# Patient Record
Sex: Male | Born: 1940 | Race: Black or African American | Hispanic: No | Smoking: Former smoker
Health system: Southern US, Community
[De-identification: ages and names within clinical notes are randomized; demographics above are authoritative.]

## PROBLEM LIST (undated history)

## (undated) DIAGNOSIS — R079 Chest pain, unspecified: Secondary | ICD-10-CM

## (undated) DIAGNOSIS — E7800 Pure hypercholesterolemia, unspecified: Secondary | ICD-10-CM

## (undated) DIAGNOSIS — I10 Essential (primary) hypertension: Secondary | ICD-10-CM

## (undated) DIAGNOSIS — N200 Calculus of kidney: Secondary | ICD-10-CM

## (undated) HISTORY — PX: PROSTATE BIOPSY: SHX241

## (undated) HISTORY — DX: Chest pain, unspecified: R07.9

## (undated) HISTORY — PX: ROTATOR CUFF REPAIR: SHX139

## (undated) HISTORY — PX: CATARACT EXTRACTION: SUR2

## (undated) HISTORY — PX: EYE SURGERY: SHX253

---

## 2011-09-15 ENCOUNTER — Emergency Department (HOSPITAL_COMMUNITY): Payer: Medicare Other

## 2011-09-15 ENCOUNTER — Encounter: Payer: Self-pay | Admitting: Emergency Medicine

## 2011-09-15 ENCOUNTER — Emergency Department (HOSPITAL_COMMUNITY)
Admission: EM | Admit: 2011-09-15 | Discharge: 2011-09-15 | Disposition: A | Discharge status: Home / Self Care | Payer: Medicare Other | Attending: Emergency Medicine | Admitting: Emergency Medicine

## 2011-09-15 DIAGNOSIS — E7800 Pure hypercholesterolemia, unspecified: Secondary | ICD-10-CM | POA: Insufficient documentation

## 2011-09-15 DIAGNOSIS — R0602 Shortness of breath: Secondary | ICD-10-CM | POA: Insufficient documentation

## 2011-09-15 DIAGNOSIS — I10 Essential (primary) hypertension: Secondary | ICD-10-CM | POA: Insufficient documentation

## 2011-09-15 DIAGNOSIS — R059 Cough, unspecified: Secondary | ICD-10-CM | POA: Insufficient documentation

## 2011-09-15 DIAGNOSIS — Z79899 Other long term (current) drug therapy: Secondary | ICD-10-CM | POA: Insufficient documentation

## 2011-09-15 DIAGNOSIS — J40 Bronchitis, not specified as acute or chronic: Secondary | ICD-10-CM | POA: Insufficient documentation

## 2011-09-15 DIAGNOSIS — E119 Type 2 diabetes mellitus without complications: Secondary | ICD-10-CM | POA: Insufficient documentation

## 2011-09-15 DIAGNOSIS — R05 Cough: Secondary | ICD-10-CM | POA: Insufficient documentation

## 2011-09-15 HISTORY — DX: Pure hypercholesterolemia, unspecified: E78.00

## 2011-09-15 HISTORY — DX: Essential (primary) hypertension: I10

## 2011-09-15 LAB — POCT I-STAT, CHEM 8
Calcium, Ion: 1.04 mmol/L — ABNORMAL LOW (ref 1.12–1.32)
Glucose, Bld: 122 mg/dL — ABNORMAL HIGH (ref 70–99)
Potassium: 3.7 mEq/L (ref 3.5–5.1)
TCO2: 27 mmol/L (ref 0–100)

## 2011-09-15 LAB — CBC
HCT: 37.4 % — ABNORMAL LOW (ref 39.0–52.0)
MCH: 26.3 pg (ref 26.0–34.0)
MCHC: 32.1 g/dL (ref 30.0–36.0)
RDW: 14.8 % (ref 11.5–15.5)

## 2011-09-15 LAB — POCT I-STAT TROPONIN I: Troponin i, poc: 0.01 ng/mL (ref 0.00–0.08)

## 2011-09-15 MED ORDER — ASPIRIN 81 MG PO CHEW
324.0000 mg | CHEWABLE_TABLET | Freq: Once | ORAL | Status: DC
  Filled 2011-09-15: qty 4

## 2011-09-15 MED ORDER — PREDNISONE 50 MG PO TABS
50.0000 mg | ORAL_TABLET | Freq: Every day | ORAL | Status: DC
  Administered 2011-09-15: 50 mg via ORAL
  Filled 2011-09-15: qty 1

## 2011-09-15 MED ORDER — ALBUTEROL SULFATE HFA 108 (90 BASE) MCG/ACT IN AERS
2.0000 | INHALATION_SPRAY | Freq: Four times a day (QID) | RESPIRATORY_TRACT | Status: DC
  Administered 2011-09-15: 2 via RESPIRATORY_TRACT
  Filled 2011-09-15: qty 6.7

## 2011-09-15 MED ORDER — AZITHROMYCIN 250 MG PO TABS: 250.0000 mg | ORAL_TABLET | Freq: Every day | ORAL | Status: AC

## 2011-09-15 NOTE — ED Provider Notes (Addendum)
History     CSN: [id number] Arrival date & time: 09/15/2011  8:43 AM   First MD Initiated Contact with Patient 09/15/11 [phone]      Chief Complaint  Patient presents with  . Cough  . Shortness of Breath  CC:  Shortness of breath   Pleasant gentleman with a known history of hypertension. Reports cough and cold symptoms for the past 2 weeks. He did see his doctor yesterday and was given prescriptions for amoxicillin, hydrocodone, and patanase. He has been coughing up some white phlegm. He actually denies any chest pain. His main concern was becoming short of breath. When he was coughing 2 days ago and again this morning. He has had no vomiting, he denies any fever at home. He has had no leg swelling or edema. No history of clots. Denies any recent sick contacts. Furthermore denies any hemoptysis.     (Consider location/radiation/quality/duration/timing/severity/associated sxs/prior treatment) HPI  Past Medical History  Diagnosis Date  . Hypertension   . Hypercholesterolemia     Past Surgical History  Procedure Date  . Eye surgery     History reviewed. No pertinent family history.  History  Substance Use Topics  . Smoking status: Never Smoker   . Smokeless tobacco: Not on file  . Alcohol Use: No      Review of Systems  All other systems reviewed and are negative.    Allergies  Review of patient's allergies indicates no known allergies.  Home Medications   Current Outpatient Rx  Name Route Sig Dispense Refill  . AMLODIPINE BESYLATE 5 MG PO TABS Oral Take 5 mg by mouth daily.      . AMOXICILLIN 875 MG PO TABS Oral Take 875 mg by mouth 2 (two) times daily.      . ATORVASTATIN CALCIUM 20 MG PO TABS Oral Take 20 mg by mouth daily.      [name] GLIMEPIRIDE 2 MG PO TABS Oral Take 2 mg by mouth 2 (two) times daily.      [name] HYDROCODONE-HOMATROPINE 5-1.5 MG/5ML PO SYRP Oral Take 5-10 mLs by mouth every 4 (four) hours as needed. For 5 days     . NAPROXEN SODIUM 220 MG PO TABS  Oral Take 220-440 mg by mouth as needed. For back pain     . OLOPATADINE HCL 0.6 % NA SOLN Nasal Place 1 puff into the nose 2 (two) times daily.      Ty Hilts PO Oral Take 1 tablet by mouth daily. 20 mg (patient unsure of hctz dose)       BP 123/63  Pulse 80  Temp(Src) 99.7 F (37.6 C) (Oral)  Resp 24  Ht 5\' 6"  (1.676 m)  Wt 180 lb (81.647 kg)  BMI 29.05 kg/m2  SpO2 98%  Physical Exam  Constitutional: He is oriented to person, place, and time. He appears well-developed and well-nourished.  HENT:  Head: Normocephalic and atraumatic.  Eyes: Conjunctivae and EOM are normal. Pupils are equal, round, and reactive to light.  Neck: Neck supple.  Cardiovascular: Normal rate and regular rhythm.  Exam reveals no gallop and no friction rub.   No murmur heard. Pulmonary/Chest: Effort normal and breath sounds normal. No respiratory distress. He has no wheezes. He has no rales. He exhibits no tenderness.  Abdominal: Soft. Bowel sounds are normal. He exhibits no distension. There is no tenderness. There is no rebound and no guarding.  Musculoskeletal: Normal range of motion.  Neurological: He is alert and oriented to person, place, and  time. No cranial nerve deficit. Coordination normal.  Skin: Skin is warm and dry. No rash noted.  Psychiatric: He has a normal mood and affect.    ED Course  Procedures (including critical care time)  Labs Reviewed  CBC - Abnormal; Notable for the following:    Hemoglobin 12.0 (*)    HCT 37.4 (*)    All other components within normal limits  POCT I-STAT, CHEM 8 - Abnormal; Notable for the following:    Glucose, Bld 122 (*)    Calcium, Ion 1.04 (*)    Hemoglobin 12.9 (*)    HCT 38.0 (*)    All other components within normal limits  POCT I-STAT TROPONIN I  I-STAT TROPONIN I  I-STAT, CHEM 8   Dg Chest 2 View  09/15/2011  *RADIOLOGY REPORT*  Clinical Data: Cough, congestion, shortness of breath, hypertension, diabetes  CHEST - 2 VIEW  Comparison:  None.  Findings: Minimal enlargement of cardiac silhouette. Mild elongation of thoracic aorta. Pulmonary vascularity normal. Minimal bronchitic changes without infiltrate or effusion. No pneumothorax. Bones appear slightly demineralized with endplate spur formation thoracic spine. Tendon anchors at right humeral head question prior rotator cuff repair.  IMPRESSION: Minimal bronchitic changes and cardiac silhouette enlargement.  Original Report Authenticated By: Lollie Marrow, M.D.     No diagnosis found.    MDM  Pt not yet into room.   Pt is seen and examined;  Initial history and physical completed.  Will follow.     Date: 09/15/2011  Rate: 76  Rhythm: normal sinus rhythm  QRS Axis: normal  Intervals: normal  ST/T Wave abnormalities: nonspecific T wave changes  Conduction Disutrbances:none  Narrative Interpretation:   Old EKG Reviewed: none available            Massie Cogliano A. Patrica Duel, MD 09/15/11 0912   Results for orders placed during the hospital encounter of 09/15/11  CBC      Component Value Range   WBC 5.7  4.0 - 10.5 (K/uL)   RBC 4.57  4.22 - 5.81 (MIL/uL)   Hemoglobin 12.0 (*) 13.0 - 17.0 (g/dL)   HCT 98.1 (*) 19.1 - 52.0 (%)   MCV 81.8  78.0 - 100.0 (fL)   MCH 26.3  26.0 - 34.0 (pg)   MCHC 32.1  30.0 - 36.0 (g/dL)   RDW 47.8  29.5 - 62.1 (%)   Platelets 203  150 - 400 (K/uL)  POCT I-STAT, CHEM 8      Component Value Range   Sodium 142  135 - 145 (mEq/L)   Potassium 3.7  3.5 - 5.1 (mEq/L)   Chloride 104  96 - 112 (mEq/L)   BUN 11  6 - 23 (mg/dL)   Creatinine, Ser 3.08  0.50 - 1.35 (mg/dL)   Glucose, Bld 657 (*) 70 - 99 (mg/dL)   Calcium, Ion 8.46 (*) 1.12 - 1.32 (mmol/L)   TCO2 27  0 - 100 (mmol/L)   Hemoglobin 12.9 (*) 13.0 - 17.0 (g/dL)   HCT 96.2 (*) 95.2 - 52.0 (%)  POCT I-STAT TROPONIN I      Component Value Range   Troponin i, poc 0.01  0.00 - 0.08 (ng/mL)   Comment 3            Dg Chest 2 View  09/15/2011  *RADIOLOGY REPORT*  Clinical  Data: Cough, congestion, shortness of breath, hypertension, diabetes  CHEST - 2 VIEW  Comparison: None.  Findings: Minimal enlargement of cardiac silhouette. Mild elongation of thoracic  aorta. Pulmonary vascularity normal. Minimal bronchitic changes without infiltrate or effusion. No pneumothorax. Bones appear slightly demineralized with endplate spur formation thoracic spine. Tendon anchors at right humeral head question prior rotator cuff repair.  IMPRESSION: Minimal bronchitic changes and cardiac silhouette enlargement.  Original Report Authenticated By: Lollie Marrow, M.D.     She was reassessed in the room, remains in no acute distress, persistent, dry cough noted. Chest x-ray and lab tests reviewed with the patient and his spouse. I speculated that this is likely caused from a viral etiology however will treat the patient with prednisone, albuterol, and changes, amoxicillin to Zithromax. He is discharged home in stable improved condition, he'll be instructed to see his doctor later this week to be rechecked, return to the ED for any concerns or changing symptoms    Kiley Torrence A. Patrica Duel, MD 09/15/11 1026

## 2011-09-15 NOTE — ED Notes (Signed)
Pt states that he has had a cough/cold for the past 2 weeks.  2 days ago he when he coughed he could not catch his breath after words.  Went to Dr. Burgess Estelle and was prescribed hydrocodone, amoxicillin, patanase.  Coughing up white phlegm.

## 2011-12-05 ENCOUNTER — Emergency Department (HOSPITAL_COMMUNITY): Payer: Medicare Other

## 2011-12-05 ENCOUNTER — Emergency Department (HOSPITAL_COMMUNITY)
Admission: EM | Admit: 2011-12-05 | Discharge: 2011-12-05 | Disposition: A | Discharge status: Home / Self Care | Payer: Medicare Other | Attending: Emergency Medicine | Admitting: Emergency Medicine

## 2011-12-05 ENCOUNTER — Encounter (HOSPITAL_COMMUNITY): Payer: Self-pay | Admitting: *Deleted

## 2011-12-05 DIAGNOSIS — Z79899 Other long term (current) drug therapy: Secondary | ICD-10-CM | POA: Insufficient documentation

## 2011-12-05 DIAGNOSIS — E119 Type 2 diabetes mellitus without complications: Secondary | ICD-10-CM | POA: Insufficient documentation

## 2011-12-05 DIAGNOSIS — E7800 Pure hypercholesterolemia, unspecified: Secondary | ICD-10-CM | POA: Insufficient documentation

## 2011-12-05 DIAGNOSIS — N509 Disorder of male genital organs, unspecified: Secondary | ICD-10-CM | POA: Insufficient documentation

## 2011-12-05 DIAGNOSIS — N400 Enlarged prostate without lower urinary tract symptoms: Secondary | ICD-10-CM | POA: Insufficient documentation

## 2011-12-05 DIAGNOSIS — N201 Calculus of ureter: Secondary | ICD-10-CM | POA: Insufficient documentation

## 2011-12-05 DIAGNOSIS — R109 Unspecified abdominal pain: Secondary | ICD-10-CM | POA: Insufficient documentation

## 2011-12-05 DIAGNOSIS — N23 Unspecified renal colic: Secondary | ICD-10-CM

## 2011-12-05 DIAGNOSIS — I10 Essential (primary) hypertension: Secondary | ICD-10-CM | POA: Insufficient documentation

## 2011-12-05 LAB — COMPREHENSIVE METABOLIC PANEL
Albumin: 3.6 g/dL (ref 3.5–5.2)
Alkaline Phosphatase: 87 U/L (ref 39–117)
BUN: 19 mg/dL (ref 6–23)
CO2: 28 mEq/L (ref 19–32)
Chloride: 105 mEq/L (ref 96–112)
Glucose, Bld: 177 mg/dL — ABNORMAL HIGH (ref 70–99)
Potassium: 4.3 mEq/L (ref 3.5–5.1)
Total Bilirubin: 0.2 mg/dL — ABNORMAL LOW (ref 0.3–1.2)

## 2011-12-05 LAB — DIFFERENTIAL
Lymphocytes Relative: 9 % — ABNORMAL LOW (ref 12–46)
Lymphs Abs: 1.2 10*3/uL (ref 0.7–4.0)
Monocytes Relative: 4 % (ref 3–12)
Neutro Abs: 10.9 10*3/uL — ABNORMAL HIGH (ref 1.7–7.7)
Neutrophils Relative %: 86 % — ABNORMAL HIGH (ref 43–77)

## 2011-12-05 LAB — CBC
Hemoglobin: 12 g/dL — ABNORMAL LOW (ref 13.0–17.0)
RBC: 4.57 MIL/uL (ref 4.22–5.81)

## 2011-12-05 LAB — URINALYSIS, ROUTINE W REFLEX MICROSCOPIC
Glucose, UA: NEGATIVE mg/dL
Ketones, ur: NEGATIVE mg/dL
Leukocytes, UA: NEGATIVE
Protein, ur: NEGATIVE mg/dL

## 2011-12-05 LAB — URINE MICROSCOPIC-ADD ON

## 2011-12-05 MED ORDER — ONDANSETRON HCL 4 MG/2ML IJ SOLN
4.0000 mg | Freq: Once | INTRAMUSCULAR | Status: AC
  Administered 2011-12-05: 4 mg via INTRAVENOUS
  Filled 2011-12-05: qty 2

## 2011-12-05 MED ORDER — ONDANSETRON HCL 4 MG PO TABS: 4.0000 mg | ORAL_TABLET | Freq: Four times a day (QID) | ORAL | Status: AC

## 2011-12-05 MED ORDER — MORPHINE SULFATE 2 MG/ML IJ SOLN
2.0000 mg | Freq: Once | INTRAMUSCULAR | Status: AC
  Administered 2011-12-05: 2 mg via INTRAVENOUS
  Filled 2011-12-05: qty 1

## 2011-12-05 MED ORDER — TAMSULOSIN HCL 0.4 MG PO CAPS: 0.4000 mg | ORAL_CAPSULE | Freq: Every day | ORAL | Status: DC

## 2011-12-05 MED ORDER — OXYCODONE-ACETAMINOPHEN 5-325 MG PO TABS: 1.0000 | ORAL_TABLET | ORAL | Status: AC | PRN

## 2011-12-05 MED ORDER — SODIUM CHLORIDE 0.9 % IV BOLUS (SEPSIS)
500.0000 mL | Freq: Once | INTRAVENOUS | Status: AC
  Administered 2011-12-05: 500 mL via INTRAVENOUS

## 2011-12-05 MED ORDER — KETOROLAC TROMETHAMINE 30 MG/ML IJ SOLN
30.0000 mg | Freq: Once | INTRAMUSCULAR | Status: AC
  Administered 2011-12-05: 30 mg via INTRAVENOUS
  Filled 2011-12-05: qty 1

## 2011-12-05 NOTE — ED Notes (Signed)
Patient is resting comfortably. 

## 2011-12-05 NOTE — ED Notes (Signed)
MD at bedside. 

## 2011-12-05 NOTE — ED Provider Notes (Signed)
History     CSN: [id number]  Arrival date & time 12/05/11  4098   First MD Initiated Contact with Patient 12/05/11 [phone]      Chief Complaint  Patient presents with  . Abdominal Pain    Left side    (Consider location/radiation/quality/duration/timing/severity/associated sxs/prior treatment) Patient is a 71 y.o. male presenting with abdominal pain.  Abdominal Pain The primary symptoms of the illness include abdominal pain. The primary symptoms of the illness do not include fever, shortness of breath, nausea, vomiting, diarrhea or dysuria. The onset of the illness was sudden. The problem has not changed since onset. Symptoms associated with the illness do not include chills, hematuria, frequency or back pain.  Pt with acute onset LLQ pain radiating to L groin starting at 0500 this AM. No N/V/D, fever, SOB, hematuria, hesitancy, dysuria, frequency, penile d/c or pain. No prev similar episodes. No personal or family hx of renal stones  Past Medical History  Diagnosis Date  . Hypertension   . Hypercholesterolemia   . Diabetes mellitus     Past Surgical History  Procedure Date  . Eye surgery   . Rotator cuff repair     Right    History reviewed. No pertinent family history.  History  Substance Use Topics  . Smoking status: Never Smoker   . Smokeless tobacco: Never Used  . Alcohol Use: No      Review of Systems  Constitutional: Negative for fever and chills.  Respiratory: Negative for shortness of breath.   Cardiovascular: Negative for chest pain.  Gastrointestinal: Positive for abdominal pain. Negative for nausea, vomiting and diarrhea.  Genitourinary: Positive for flank pain and testicular pain. Negative for dysuria, frequency, hematuria, discharge and penile pain.  Musculoskeletal: Negative for back pain.  Skin: Negative for color change, pallor and rash.  Neurological: Negative for dizziness, weakness and numbness.    Allergies  Review of patient's allergies  indicates no known allergies.  Home Medications   Current Outpatient Rx  Name Route Sig Dispense Refill  . AMLODIPINE BESYLATE 5 MG PO TABS Oral Take 5 mg by mouth daily.      . ASPIRIN EC 81 MG PO TBEC Oral Take 81 mg by mouth daily.    . ATORVASTATIN CALCIUM 20 MG PO TABS Oral Take 20 mg by mouth daily.      [name] VITAMIN D 1000 UNITS PO TABS Oral Take 1,000 Units by mouth daily.    [name] GLIMEPIRIDE 2 MG PO TABS Oral Take 2 mg by mouth 2 (two) times daily.      [name] NAPROXEN SODIUM 220 MG PO TABS Oral Take 220-440 mg by mouth as needed. For back pain     . ONDANSETRON HCL 4 MG PO TABS Oral Take 1 tablet (4 mg total) by mouth every 6 (six) hours. 12 tablet 0  . OXYCODONE-ACETAMINOPHEN 5-325 MG PO TABS Oral Take 1 tablet by mouth every 4 (four) hours as needed for pain. 15 tablet 0  . TAMSULOSIN HCL 0.4 MG PO CAPS Oral Take 1 capsule (0.4 mg total) by mouth daily. 5 capsule 0    BP 128/63  Pulse 71  Temp(Src) 98.1 F (36.7 C) (Oral)  Resp 17  Wt 185 lb (83.915 kg)  SpO2 99%  Physical Exam  Nursing note and vitals reviewed. Constitutional: He is oriented to person, place, and time. He appears well-developed and well-nourished. No distress.  HENT:  Head: Normocephalic and atraumatic.  Mouth/Throat: Oropharynx is clear and moist.  Eyes: EOM  are normal. Pupils are equal, round, and reactive to light.  Neck: Normal range of motion. Neck supple.  Cardiovascular: Normal rate and regular rhythm.   Pulmonary/Chest: Effort normal and breath sounds normal. No respiratory distress. He has no wheezes. He has no rales.  Abdominal: Soft. Bowel sounds are normal. He exhibits no distension and no mass. There is no tenderness. There is no rebound and no guarding. Hernia confirmed negative in the right inguinal area and confirmed negative in the left inguinal area.  Genitourinary: Cremasteric reflex is present. Right testis shows no mass, no swelling and no tenderness. Left testis shows no mass, no swelling  and no tenderness.  Musculoskeletal: Normal range of motion. He exhibits no edema and no tenderness.       No flank tenderness or midline lumbar ttp  Neurological: He is alert and oriented to person, place, and time.  Skin: Skin is warm and dry. No rash noted. No erythema.  Psychiatric: He has a normal mood and affect. His behavior is normal.    ED Course  Procedures (including critical care time)  Labs Reviewed  CBC - Abnormal; Notable for the following:    WBC 12.6 (*)    Hemoglobin 12.0 (*)    HCT 37.3 (*)    All other components within normal limits  DIFFERENTIAL - Abnormal; Notable for the following:    Neutrophils Relative 86 (*)    Neutro Abs 10.9 (*)    Lymphocytes Relative 9 (*)    All other components within normal limits  COMPREHENSIVE METABOLIC PANEL - Abnormal; Notable for the following:    Glucose, Bld 177 (*)    Creatinine, Ser 1.38 (*)    Total Bilirubin 0.2 (*)    GFR calc non Af Amer 50 (*)    GFR calc Af Amer 58 (*)    All other components within normal limits  URINALYSIS, ROUTINE W REFLEX MICROSCOPIC - Abnormal; Notable for the following:    Hgb urine dipstick LARGE (*)    All other components within normal limits  URINE MICROSCOPIC-ADD ON - Abnormal; Notable for the following:    Bacteria, UA FEW (*)    All other components within normal limits   Ct Abdomen Pelvis Wo Contrast  12/05/2011  *RADIOLOGY REPORT*  Clinical Data: Left flank pain.  CT ABDOMEN AND PELVIS WITHOUT CONTRAST  Technique:  Multidetector CT imaging of the abdomen and pelvis was performed following the standard protocol without intravenous contrast.  Comparison: None.  Findings: Visualized lung bases clear.  Patchy coronary and aortoiliac arterial calcifications.  Unremarkable uninfused evaluation of the liver, gallbladder, spleen, adrenal glands, pancreas.  There are inflammatory/edematous changes around the left kidney and left renal collecting system.  There is mild left ureterectasis down  to the level of a 2 mm calculus, approximately 2 cm from the ureteral orifice.  Urinary bladder is incompletely distended. There is a 3.6 cm fluid attenuation lesion in the lower pole right kidney.  There is a 1 mm calculus in the upper pole right renal collecting system.  Stomach and small bowel are nondistended.  Normal appendix.  Colon is nondilated, unremarkable.  There is marked prostatic enlargement with coarse central calcifications.  No ascites.  No free air. Degenerative disc disease L5-S1.  IMPRESSION:  1.  Partially obstructing 2 mm distal left ureteral calculus. 2.  Right nephrolithiasis without hydronephrosis. 3.  3.6 cm possible right renal cyst, incompletely characterized. 4.  Coronary and aortoiliac calcifications. 5.  Marked prostatic enlargement.  Original  Report Authenticated By: Thora Lance III, M.D.     1. Renal colic on left side       MDM   Pt symptoms have resolved. F/U with urology for persistent pain or return to ED for fever, chills, worsening symptoms       Loren Racer, MD 12/05/11 1137

## 2011-12-05 NOTE — ED Notes (Signed)
Family at bedside. 

## 2011-12-05 NOTE — ED Notes (Signed)
Vital signs stable. 

## 2011-12-05 NOTE — ED Notes (Signed)
Pt from home c/o pain that started at 0500 this morning in left groin and then moved into left abdomen, side and back. Pt has brief episode of nausea but no vomiting. Pt denies injury.

## 2013-05-23 ENCOUNTER — Ambulatory Visit: Payer: Medicare Other | Admitting: *Deleted

## 2013-05-23 DIAGNOSIS — R000 Tachycardia, unspecified: Secondary | ICD-10-CM

## 2013-05-23 DIAGNOSIS — I10 Essential (primary) hypertension: Secondary | ICD-10-CM

## 2013-05-23 NOTE — Progress Notes (Signed)
Holter monitor was place on patient, explained monitor to patient, patient verbally understand.

## 2013-05-30 ENCOUNTER — Other Ambulatory Visit: Payer: Self-pay | Admitting: *Deleted

## 2013-05-30 DIAGNOSIS — R002 Palpitations: Secondary | ICD-10-CM

## 2014-02-07 ENCOUNTER — Emergency Department (HOSPITAL_COMMUNITY)
Admission: EM | Admit: 2014-02-07 | Discharge: 2014-02-08 | Disposition: A | Discharge status: Home / Self Care | Payer: Medicare Other | Attending: Emergency Medicine | Admitting: Emergency Medicine

## 2014-02-07 ENCOUNTER — Emergency Department (HOSPITAL_COMMUNITY): Payer: Medicare Other

## 2014-02-07 ENCOUNTER — Encounter (HOSPITAL_COMMUNITY): Payer: Self-pay | Admitting: Emergency Medicine

## 2014-02-07 DIAGNOSIS — N200 Calculus of kidney: Secondary | ICD-10-CM | POA: Insufficient documentation

## 2014-02-07 DIAGNOSIS — N289 Disorder of kidney and ureter, unspecified: Secondary | ICD-10-CM

## 2014-02-07 DIAGNOSIS — G8929 Other chronic pain: Secondary | ICD-10-CM | POA: Insufficient documentation

## 2014-02-07 DIAGNOSIS — E7800 Pure hypercholesterolemia, unspecified: Secondary | ICD-10-CM | POA: Insufficient documentation

## 2014-02-07 DIAGNOSIS — Z79899 Other long term (current) drug therapy: Secondary | ICD-10-CM | POA: Insufficient documentation

## 2014-02-07 DIAGNOSIS — E119 Type 2 diabetes mellitus without complications: Secondary | ICD-10-CM | POA: Insufficient documentation

## 2014-02-07 DIAGNOSIS — Z7982 Long term (current) use of aspirin: Secondary | ICD-10-CM | POA: Insufficient documentation

## 2014-02-07 DIAGNOSIS — I10 Essential (primary) hypertension: Secondary | ICD-10-CM | POA: Insufficient documentation

## 2014-02-07 LAB — I-STAT CHEM 8, ED
BUN: 20 mg/dL (ref 6–23)
CALCIUM ION: 1.16 mmol/L (ref 1.13–1.30)
CHLORIDE: 104 meq/L (ref 96–112)
Creatinine, Ser: 1.4 mg/dL — ABNORMAL HIGH (ref 0.50–1.35)
Glucose, Bld: 143 mg/dL — ABNORMAL HIGH (ref 70–99)
HEMATOCRIT: 47 % (ref 39.0–52.0)
Hemoglobin: 16 g/dL (ref 13.0–17.0)
POTASSIUM: 4.7 meq/L (ref 3.7–5.3)
Sodium: 143 mEq/L (ref 137–147)
TCO2: 30 mmol/L (ref 0–100)

## 2014-02-07 LAB — URINALYSIS, ROUTINE W REFLEX MICROSCOPIC
BILIRUBIN URINE: NEGATIVE
GLUCOSE, UA: NEGATIVE mg/dL
KETONES UR: NEGATIVE mg/dL
Nitrite: NEGATIVE
PH: 7.5 (ref 5.0–8.0)
Protein, ur: NEGATIVE mg/dL
SPECIFIC GRAVITY, URINE: 1.014 (ref 1.005–1.030)
Urobilinogen, UA: 0.2 mg/dL (ref 0.0–1.0)

## 2014-02-07 LAB — URINE MICROSCOPIC-ADD ON

## 2014-02-07 MED ORDER — OXYCODONE-ACETAMINOPHEN 5-325 MG PO TABS
1.0000 | ORAL_TABLET | Freq: Once | ORAL | Status: AC
  Administered 2014-02-07: 1 via ORAL
  Filled 2014-02-07: qty 1

## 2014-02-07 MED ORDER — ONDANSETRON 8 MG PO TBDP
8.0000 mg | ORAL_TABLET | Freq: Once | ORAL | Status: AC
Start: 2014-02-07 — End: 2014-02-07
  Administered 2014-02-07: 8 mg via ORAL
  Filled 2014-02-07: qty 1

## 2014-02-07 MED ORDER — MORPHINE SULFATE 4 MG/ML IJ SOLN
4.0000 mg | Freq: Once | INTRAMUSCULAR | Status: AC
  Administered 2014-02-07: 4 mg via INTRAMUSCULAR
  Filled 2014-02-07: qty 1

## 2014-02-07 NOTE — ED Notes (Signed)
Per patient- started having left flank pain Monday night that awoke him. Describes patient as a throbbing pain. Pt reports having kidney stone in February with similar pain.

## 2014-02-07 NOTE — ED Notes (Signed)
Patient transported to CT 

## 2014-02-08 MED ORDER — TAMSULOSIN HCL 0.4 MG PO CAPS: 0.4000 mg | ORAL_CAPSULE | Freq: Every day | ORAL | Status: DC

## 2014-02-08 MED ORDER — OXYCODONE-ACETAMINOPHEN 5-325 MG PO TABS: 1.0000 | ORAL_TABLET | ORAL | Status: DC | PRN

## 2014-02-08 MED ORDER — OXYCODONE-ACETAMINOPHEN 5-325 MG PO TABS
1.0000 | ORAL_TABLET | Freq: Once | ORAL | Status: AC
Start: 2014-02-08 — End: 2014-02-08
  Administered 2014-02-08: 1 via ORAL
  Filled 2014-02-08: qty 1

## 2014-02-08 MED ORDER — ONDANSETRON HCL 8 MG PO TABS: 8.0000 mg | ORAL_TABLET | Freq: Three times a day (TID) | ORAL | Status: DC | PRN

## 2014-02-08 NOTE — ED Provider Notes (Signed)
CSN: [id number]     Arrival date & time 02/07/14  2059 History   First MD Initiated Contact with Patient 02/07/14 2153     Chief Complaint  Patient presents with  . Flank Pain     (Consider location/radiation/quality/duration/timing/severity/associated sxs/prior Treatment) HPI History provided by pt.   Pt developed pain in L flank 4 days ago.  Pain has been intermittent and non-radiating and is mildly aggravated by laying on right side.  Associated w/ urinary urgency.  Denies fever, cough, SOB, abdominal pain, N/V/D, other GU sx, LE weakness/paresthesias.  Denies trauma.  Has chronic low back pain but this pain different.  H/o kidney stone 11/2012 but that pain was more severe.   Past Medical History  Diagnosis Date  . Hypertension   . Hypercholesterolemia   . Diabetes mellitus    Past Surgical History  Procedure Laterality Date  . Eye surgery    . Rotator cuff repair      Right   History reviewed. No pertinent family history. History  Substance Use Topics  . Smoking status: Never Smoker   . Smokeless tobacco: Never Used  . Alcohol Use: No    Review of Systems  All other systems reviewed and are negative.     Allergies  Review of patient's allergies indicates no known allergies.  Home Medications   Prior to Admission medications   Medication Sig Start Date End Date Taking? Authorizing Provider  amLODipine (NORVASC) 5 MG tablet Take 5 mg by mouth daily.     Yes Historical Provider, MD  aspirin EC 81 MG tablet Take 81 mg by mouth daily.   Yes Historical Provider, MD  atorvastatin (LIPITOR) 20 MG tablet Take 20 mg by mouth daily.     Yes Historical Provider, MD  cholecalciferol (VITAMIN D) 1000 UNITS tablet Take 1,000 Units by mouth daily.   Yes Historical Provider, MD  glimepiride (AMARYL) 2 MG tablet Take 2 mg by mouth 2 (two) times daily.     Yes Historical Provider, MD  naproxen sodium (ALEVE) 220 MG tablet Take 220-440 mg by mouth as needed. For back pain    Yes  Historical Provider, MD  ondansetron (ZOFRAN) 8 MG tablet Take 1 tablet (8 mg total) by mouth every 8 (eight) hours as needed for nausea or vomiting. 02/08/14   Arville Lime Kumar Falwell, PA-C  oxyCODONE-acetaminophen (PERCOCET/ROXICET) 5-325 MG per tablet Take 1 tablet by mouth every 4 (four) hours as needed for severe pain. 02/08/14   Arville Lime Jozie Wulf, PA-C  tamsulosin (FLOMAX) 0.4 MG CAPS capsule Take 1 capsule (0.4 mg total) by mouth at bedtime. 02/08/14   Arville Lime Enzo Treu, PA-C   BP 163/78  Pulse 70  Temp(Src) 98.8 F (37.1 C) (Oral)  SpO2 100% Physical Exam  Nursing note and vitals reviewed. Constitutional: He is oriented to person, place, and time. He appears well-developed and well-nourished. No distress.  Pt does not appear uncomfortable.   HENT:  Head: Normocephalic and atraumatic.  Eyes:  Normal appearance  Neck: Normal range of motion.  Cardiovascular: Normal rate and regular rhythm.   Pulmonary/Chest: Effort normal and breath sounds normal. No respiratory distress.  Abdominal: Soft. Bowel sounds are normal. He exhibits no distension and no mass. There is no tenderness. There is no rebound and no guarding.  Genitourinary:  No CVA tenderness  Musculoskeletal: Normal range of motion.  T/L spine non-tender.  Pain in L flank is not aggravated by movement of torso.  Neurological: He is alert and oriented to  person, place, and time.  Skin: Skin is warm and dry. No rash noted.  Psychiatric: He has a normal mood and affect. His behavior is normal.    ED Course  Procedures (including critical care time) Labs Review Labs Reviewed  URINALYSIS, ROUTINE W REFLEX MICROSCOPIC - Abnormal; Notable for the following:    Hgb urine dipstick MODERATE (*)    Leukocytes, UA TRACE (*)    All other components within normal limits  I-STAT CHEM 8, ED - Abnormal; Notable for the following:    Creatinine, Ser 1.40 (*)    Glucose, Bld 143 (*)    All other components within normal limits   URINE MICROSCOPIC-ADD ON    Imaging Review Ct Abdomen Pelvis Wo Contrast  02/07/2014   CLINICAL DATA:  Left flank pain  EXAM: CT ABDOMEN AND PELVIS WITHOUT CONTRAST  TECHNIQUE: Multidetector CT imaging of the abdomen and pelvis was performed following the standard protocol without IV contrast.  COMPARISON:  DG ABDOMEN 1V dated 06/15/2012; CT ABD/PELV WO CM dated 12/05/2011  FINDINGS: Mild left hydronephrosis. Moderate left perinephric stranding. 4 mm calculus in the distal left ureter, just above the ureterovesical junction.  Tiny calculi in the right renal collecting system. Simple cyst in the right kidney.  Liver, gallbladder, spleen, pancreas, adrenal glands are within normal limits.  Normal appendix.  No free-fluid  Prostate is markedly enlarged.  Degenerative disc disease in the lower lumbar spine. No vertebral compression deformity.  IMPRESSION: 4 mm distal left ureteral calculus is associated with the left perinephric stranding and hydronephrosis.  Minimal right nephrolithiasis.   Electronically Signed   By: Maryclare Bean M.D.   On: 02/07/2014 23:40     EKG Interpretation None      MDM   Final diagnoses:  Nephrolithiasis  Renal insufficiency    73yo M presents w/ non-traumatic L flank pain.  CT abd/pelvis shows 43mm ureteral stone.  U/A neg for infection.  Cr elevated but appears to be his baseline.  Pain controlled w/ IM morphine; pt denies nausea.  D/c'd home w/ referral to urology, who he has seen for nephrolithiasis in the past, as well as percocet, zofran and flomax.  Return precautions discussed. Marland Kitchennwo    Remer Macho, PA-C 02/08/14 [phone]

## 2014-02-08 NOTE — ED Notes (Signed)
Patient's wife upset r/t wait time on DC papers---PA and charge nurse made aware

## 2014-02-08 NOTE — Discharge Instructions (Signed)
Take percocet as needed for severe pain.  Do not drive within four hours of taking this medication.  Take zofran as needed for nausea.  Take Flomax as prescribed.  This medication may cause lightheadedness upon standing.  Drink plenty of fluids.  Follow up with [facility].  You should return to the ER if you develop fever, worsening pain or uncontrolled vomiting.

## 2014-02-11 NOTE — ED Provider Notes (Signed)
Medical screening examination/treatment/procedure(s) were performed by non-physician practitioner and as supervising physician I was immediately available for consultation/collaboration.   EKG Interpretation None        Ephraim Hamburger, MD 02/11/14 [phone]

## 2014-03-02 LAB — POCT GLUCOSE (DEVICE FOR HOME USE): GLUCOSE FASTING, POC: 117 mg/dL — AB (ref 70–99)

## 2014-05-02 ENCOUNTER — Encounter: Payer: Self-pay | Admitting: *Deleted

## 2014-05-02 ENCOUNTER — Encounter: Payer: Medicare Other | Attending: Family Medicine | Admitting: *Deleted

## 2014-05-02 VITALS — Ht 66.0 in | Wt 179.6 lb

## 2014-05-02 DIAGNOSIS — E119 Type 2 diabetes mellitus without complications: Secondary | ICD-10-CM | POA: Insufficient documentation

## 2014-05-02 DIAGNOSIS — Z713 Dietary counseling and surveillance: Secondary | ICD-10-CM | POA: Diagnosis not present

## 2014-05-02 NOTE — Patient Instructions (Addendum)
Plan:  Aim for 4 Carb Choices per meal (60 grams) +/- 1 either way  Aim for 0-2 Carbs per snack if hungry  Include protein in moderation with your meals and snacks Consider reading food labels for Total Carbohydrate of foods Continue with your activity level daily as tolerated, great job! Consider checking BG at alternate times per day  Continue taking medication: Amaryl twice a day before meals as directed by MD

## 2014-05-02 NOTE — Progress Notes (Signed)
Appt start time: 1430 end time:  1600.  Assessment:  Patient was seen on  05/02/14 for individual diabetes education. States he retired from [employer] job in 2000, has been going to [employer] 4-5 days a week since then. States history of pre-diabetes in the past, now on diabetes medication. SMBG once a day or so with reported range 98-120 mg/dl before meals in AM. He buys and prepares his own meals.   Current HbA1c:  6.9%   Preferred Learning Style:  No preference indicated   Learning Readiness:   Ready  Change in progress  MEDICATIONS: see list, diabetes medication is Amaryl  DIETARY INTAKE:  24-hr recall:  B ( AM): no  Snk ( AM): no  L (11 AM): grits, eggs, toast OR oatmeal, boiled egg, toast, occasionally juice, tea with sweetener Snk ( PM): no D ( 5-7 PM): meat, occasionally starch and vegetables, juice or water usually, occasionally wine Snk ( PM): yoplait occasionally Beverages: tea, water, juice  Usual physical activity: goes to gym 4-5 days a week, cardio exercises: treadmill and bike for about 4 miles each every dayday  Estimated energy needs: 1600 calories 180 g carbohydrates 120 g protein 44 g fat  Progress Towards Goal(s):  In progress.   Nutritional Diagnosis:  NB-1.1 Food and nutrition-related knowledge deficit As related to diabetes.  As evidenced by new diagnosis with A1c of 6.9%.    Intervention:  Nutrition counseling provided.  Discussed diabetes disease process and treatment options.  Discussed physiology of diabetes and role of obesity on insulin resistance.  Encouraged moderate weight reduction to improve glucose levels.   Provided education on macronutrients on glucose levels.  Provided education on carb counting, importance of regularly scheduled meals/snacks, and meal planning  Discussed effects of physical activity on glucose levels and long-term glucose control.  Recommended he continue with his current physical activity/week.  Reviewed  patient medications.  Discussed role of medication on blood glucose and possible side effects  Discussed blood glucose monitoring and interpretation.  Discussed recommended target ranges and individual ranges.    Described short-term complications: hyper- and hypo-glycemia.  Discussed causes,symptoms, and treatment options.  Discussed prevention, detection, and treatment of long-term complications.  Discussed the role of prolonged elevated glucose levels on body systems.  Discussed role of stress on blood glucose levels and discussed strategies to manage psychosocial issues.  Discussed recommendations for long-term diabetes self-care.  Provided checklist for medical, dental, and emotional self-care.  Plan:  Aim for 4 Carb Choices per meal (60 grams) +/- 1 either way  Aim for 0-2 Carbs per snack if hungry  Include protein in moderation with your meals and snacks Consider reading food labels for Total Carbohydrate of foods Continue with your activity level daily as tolerated, great job! Consider checking BG at alternate times per day  Continue taking medication: Amaryl twice a day before meals as directed by MD  Teaching Method Utilized: Visual, Auditory and Hands on  Handouts given during visit include: Living Well with Diabetes Carb Counting and Food Label handouts Meal Plan Card  Barriers to learning/adherence to lifestyle change: none  Diabetes self-care support plan:   [facility] support group  Demonstrated degree of understanding via:  Teach Back   Monitoring/Evaluation:  Dietary intake, exercise, reading food labels, and body weight prn.

## 2014-07-05 ENCOUNTER — Encounter (INDEPENDENT_AMBULATORY_CARE_PROVIDER_SITE_OTHER): Payer: Medicare Other | Admitting: Ophthalmology

## 2014-07-05 DIAGNOSIS — E1139 Type 2 diabetes mellitus with other diabetic ophthalmic complication: Secondary | ICD-10-CM

## 2014-07-05 DIAGNOSIS — H35379 Puckering of macula, unspecified eye: Secondary | ICD-10-CM

## 2014-07-05 DIAGNOSIS — H43819 Vitreous degeneration, unspecified eye: Secondary | ICD-10-CM

## 2014-07-05 DIAGNOSIS — H35039 Hypertensive retinopathy, unspecified eye: Secondary | ICD-10-CM

## 2014-07-05 DIAGNOSIS — E1165 Type 2 diabetes mellitus with hyperglycemia: Secondary | ICD-10-CM

## 2014-07-05 DIAGNOSIS — E11319 Type 2 diabetes mellitus with unspecified diabetic retinopathy without macular edema: Secondary | ICD-10-CM

## 2014-07-05 DIAGNOSIS — H2510 Age-related nuclear cataract, unspecified eye: Secondary | ICD-10-CM

## 2014-07-05 DIAGNOSIS — I10 Essential (primary) hypertension: Secondary | ICD-10-CM

## 2014-11-04 DIAGNOSIS — R972 Elevated prostate specific antigen [PSA]: Secondary | ICD-10-CM | POA: Diagnosis not present

## 2014-11-11 DIAGNOSIS — N401 Enlarged prostate with lower urinary tract symptoms: Secondary | ICD-10-CM | POA: Diagnosis not present

## 2014-11-11 DIAGNOSIS — R972 Elevated prostate specific antigen [PSA]: Secondary | ICD-10-CM | POA: Diagnosis not present

## 2014-11-11 DIAGNOSIS — R351 Nocturia: Secondary | ICD-10-CM | POA: Diagnosis not present

## 2014-11-17 ENCOUNTER — Inpatient Hospital Stay (HOSPITAL_COMMUNITY)
Admission: EM | Admit: 2014-11-17 | Discharge: 2014-11-22 | DRG: 872 | Disposition: A | Discharge status: Home Health | Payer: Medicare Other | Attending: Internal Medicine | Admitting: Internal Medicine

## 2014-11-17 ENCOUNTER — Encounter (HOSPITAL_COMMUNITY): Payer: Self-pay | Admitting: Emergency Medicine

## 2014-11-17 DIAGNOSIS — E1159 Type 2 diabetes mellitus with other circulatory complications: Secondary | ICD-10-CM

## 2014-11-17 DIAGNOSIS — E780 Pure hypercholesterolemia: Secondary | ICD-10-CM | POA: Diagnosis present

## 2014-11-17 DIAGNOSIS — R509 Fever, unspecified: Secondary | ICD-10-CM | POA: Diagnosis not present

## 2014-11-17 DIAGNOSIS — B9689 Other specified bacterial agents as the cause of diseases classified elsewhere: Secondary | ICD-10-CM | POA: Diagnosis not present

## 2014-11-17 DIAGNOSIS — E119 Type 2 diabetes mellitus without complications: Secondary | ICD-10-CM | POA: Diagnosis present

## 2014-11-17 DIAGNOSIS — I10 Essential (primary) hypertension: Secondary | ICD-10-CM | POA: Diagnosis present

## 2014-11-17 DIAGNOSIS — Z87442 Personal history of urinary calculi: Secondary | ICD-10-CM | POA: Diagnosis not present

## 2014-11-17 DIAGNOSIS — I152 Hypertension secondary to endocrine disorders: Secondary | ICD-10-CM

## 2014-11-17 DIAGNOSIS — E1165 Type 2 diabetes mellitus with hyperglycemia: Secondary | ICD-10-CM

## 2014-11-17 DIAGNOSIS — K5900 Constipation, unspecified: Secondary | ICD-10-CM | POA: Diagnosis present

## 2014-11-17 DIAGNOSIS — A419 Sepsis, unspecified organism: Principal | ICD-10-CM | POA: Diagnosis present

## 2014-11-17 DIAGNOSIS — E1169 Type 2 diabetes mellitus with other specified complication: Secondary | ICD-10-CM

## 2014-11-17 DIAGNOSIS — N390 Urinary tract infection, site not specified: Secondary | ICD-10-CM | POA: Diagnosis not present

## 2014-11-17 DIAGNOSIS — E1100 Type 2 diabetes mellitus with hyperosmolarity without nonketotic hyperglycemic-hyperosmolar coma (NKHHC): Secondary | ICD-10-CM | POA: Diagnosis not present

## 2014-11-17 DIAGNOSIS — E785 Hyperlipidemia, unspecified: Secondary | ICD-10-CM

## 2014-11-17 DIAGNOSIS — E860 Dehydration: Secondary | ICD-10-CM | POA: Diagnosis present

## 2014-11-17 DIAGNOSIS — A4151 Sepsis due to Escherichia coli [E. coli]: Secondary | ICD-10-CM | POA: Diagnosis not present

## 2014-11-17 HISTORY — DX: Type 2 diabetes mellitus with other circulatory complications: E11.59

## 2014-11-17 HISTORY — DX: Type 2 diabetes mellitus with other specified complication: E11.69

## 2014-11-17 HISTORY — DX: Urinary tract infection, site not specified: N39.0

## 2014-11-17 HISTORY — DX: Calculus of kidney: N20.0

## 2014-11-17 HISTORY — DX: Hypertension secondary to endocrine disorders: I15.2

## 2014-11-17 LAB — COMPREHENSIVE METABOLIC PANEL
ALBUMIN: 3.4 g/dL — AB (ref 3.5–5.2)
ALK PHOS: 79 U/L (ref 39–117)
ALT: 30 U/L (ref 0–53)
AST: 29 U/L (ref 0–37)
Anion gap: 6 (ref 5–15)
BILIRUBIN TOTAL: 0.8 mg/dL (ref 0.3–1.2)
BUN: 18 mg/dL (ref 6–23)
CO2: 26 mmol/L (ref 19–32)
Calcium: 8.5 mg/dL (ref 8.4–10.5)
Chloride: 106 mmol/L (ref 96–112)
Creatinine, Ser: 1.25 mg/dL (ref 0.50–1.35)
GFR calc Af Amer: 64 mL/min — ABNORMAL LOW (ref >90)
GFR, EST NON AFRICAN AMERICAN: 55 mL/min — AB (ref >90)
GLUCOSE: 195 mg/dL — AB (ref 70–99)
POTASSIUM: 3.7 mmol/L (ref 3.5–5.1)
Sodium: 138 mmol/L (ref 135–145)
TOTAL PROTEIN: 6.8 g/dL (ref 6.0–8.3)

## 2014-11-17 LAB — GLUCOSE, CAPILLARY
Glucose-Capillary: 181 mg/dL — ABNORMAL HIGH (ref 70–99)
Glucose-Capillary: 144 mg/dL — ABNORMAL HIGH (ref 70–99)

## 2014-11-17 LAB — CBG MONITORING, ED: GLUCOSE-CAPILLARY: 164 mg/dL — AB (ref 70–99)

## 2014-11-17 LAB — URINE MICROSCOPIC-ADD ON

## 2014-11-17 LAB — CBC WITH DIFFERENTIAL/PLATELET
BASOS ABS: 0 10*3/uL (ref 0.0–0.1)
Basophils Relative: 0 % (ref 0–1)
EOS PCT: 0 % (ref 0–5)
Eosinophils Absolute: 0 10*3/uL (ref 0.0–0.7)
HCT: 37.6 % — ABNORMAL LOW (ref 39.0–52.0)
HEMOGLOBIN: 12.1 g/dL — AB (ref 13.0–17.0)
LYMPHS ABS: 1.4 10*3/uL (ref 0.7–4.0)
Lymphocytes Relative: 5 % — ABNORMAL LOW (ref 12–46)
MCH: 26.6 pg (ref 26.0–34.0)
MCHC: 32.2 g/dL (ref 30.0–36.0)
MCV: 82.6 fL (ref 78.0–100.0)
MONO ABS: 1.1 10*3/uL — AB (ref 0.1–1.0)
MONOS PCT: 4 % (ref 3–12)
NEUTROS PCT: 91 % — AB (ref 43–77)
Neutro Abs: 25.1 10*3/uL — ABNORMAL HIGH (ref 1.7–7.7)
Platelets: 226 10*3/uL (ref 150–400)
RBC: 4.55 MIL/uL (ref 4.22–5.81)
RDW: 14.6 % (ref 11.5–15.5)
WBC: 27.6 10*3/uL — ABNORMAL HIGH (ref 4.0–10.5)

## 2014-11-17 LAB — URINALYSIS, ROUTINE W REFLEX MICROSCOPIC
Bilirubin Urine: NEGATIVE
GLUCOSE, UA: NEGATIVE mg/dL
KETONES UR: NEGATIVE mg/dL
NITRITE: NEGATIVE
PH: 5 (ref 5.0–8.0)
PROTEIN: 30 mg/dL — AB
Specific Gravity, Urine: 1.03 (ref 1.005–1.030)
Urobilinogen, UA: 0.2 mg/dL (ref 0.0–1.0)

## 2014-11-17 MED ORDER — AMLODIPINE BESYLATE 5 MG PO TABS
5.0000 mg | ORAL_TABLET | Freq: Every day | ORAL | Status: DC
  Administered 2014-11-17 – 2014-11-22 (×6): 5 mg via ORAL
  Filled 2014-11-17 (×6): qty 1

## 2014-11-17 MED ORDER — ACETAMINOPHEN 325 MG PO TABS
650.0000 mg | ORAL_TABLET | Freq: Four times a day (QID) | ORAL | Status: DC | PRN
  Administered 2014-11-18 – 2014-11-20 (×2): 650 mg via ORAL
  Filled 2014-11-17 (×2): qty 2

## 2014-11-17 MED ORDER — LATANOPROST 0.005 % OP SOLN
1.0000 [drp] | Freq: Every day | OPHTHALMIC | Status: DC
  Administered 2014-11-17 – 2014-11-20 (×3): 1 [drp] via OPHTHALMIC
  Filled 2014-11-17: qty 2.5

## 2014-11-17 MED ORDER — ATORVASTATIN CALCIUM 20 MG PO TABS
20.0000 mg | ORAL_TABLET | Freq: Every day | ORAL | Status: DC
  Administered 2014-11-18 – 2014-11-22 (×5): 20 mg via ORAL
  Filled 2014-11-17 (×5): qty 1

## 2014-11-17 MED ORDER — SODIUM CHLORIDE 0.9 % IV SOLN
INTRAVENOUS | Status: DC
Start: 2014-11-17 — End: 2014-11-22
  Administered 2014-11-17 – 2014-11-19 (×3): via INTRAVENOUS

## 2014-11-17 MED ORDER — CIPROFLOXACIN IN D5W 400 MG/200ML IV SOLN
400.0000 mg | Freq: Once | INTRAVENOUS | Status: AC
  Administered 2014-11-17: 400 mg via INTRAVENOUS
  Filled 2014-11-17: qty 200

## 2014-11-17 MED ORDER — SODIUM CHLORIDE 0.9 % IJ SOLN
3.0000 mL | Freq: Two times a day (BID) | INTRAMUSCULAR | Status: DC
  Administered 2014-11-21 – 2014-11-22 (×3): 3 mL via INTRAVENOUS

## 2014-11-17 MED ORDER — INSULIN ASPART 100 UNIT/ML ~~LOC~~ SOLN
0.0000 [IU] | Freq: Every day | SUBCUTANEOUS | Status: DC
Start: 2014-11-17 — End: 2014-11-22

## 2014-11-17 MED ORDER — ALUM & MAG HYDROXIDE-SIMETH 200-200-20 MG/5ML PO SUSP: 30.0000 mL | Freq: Four times a day (QID) | ORAL | Status: DC | PRN

## 2014-11-17 MED ORDER — TAMSULOSIN HCL 0.4 MG PO CAPS: 0.4000 mg | ORAL_CAPSULE | Freq: Every day | ORAL | Status: DC

## 2014-11-17 MED ORDER — SODIUM CHLORIDE 0.9 % IV BOLUS (SEPSIS)
1000.0000 mL | Freq: Once | INTRAVENOUS | Status: AC
  Administered 2014-11-17: 1000 mL via INTRAVENOUS

## 2014-11-17 MED ORDER — OXYCODONE-ACETAMINOPHEN 5-325 MG PO TABS: 1.0000 | ORAL_TABLET | ORAL | Status: DC | PRN

## 2014-11-17 MED ORDER — ACETAMINOPHEN 650 MG RE SUPP: 650.0000 mg | Freq: Four times a day (QID) | RECTAL | Status: DC | PRN

## 2014-11-17 MED ORDER — POLYETHYLENE GLYCOL 3350 17 G PO PACK
17.0000 g | PACK | Freq: Every day | ORAL | Status: DC
  Administered 2014-11-17 – 2014-11-21 (×5): 17 g via ORAL
  Filled 2014-11-17 (×6): qty 1

## 2014-11-17 MED ORDER — INSULIN ASPART 100 UNIT/ML ~~LOC~~ SOLN
0.0000 [IU] | Freq: Three times a day (TID) | SUBCUTANEOUS | Status: DC
  Administered 2014-11-17 – 2014-11-18 (×3): 3 [IU] via SUBCUTANEOUS
  Administered 2014-11-19: 2 [IU] via SUBCUTANEOUS
  Administered 2014-11-19 – 2014-11-20 (×3): 3 [IU] via SUBCUTANEOUS
  Administered 2014-11-21: 2 [IU] via SUBCUTANEOUS
  Administered 2014-11-21: 3 [IU] via SUBCUTANEOUS
  Administered 2014-11-22: 2 [IU] via SUBCUTANEOUS
  Administered 2014-11-22: 3 [IU] via SUBCUTANEOUS

## 2014-11-17 MED ORDER — ENOXAPARIN SODIUM 40 MG/0.4ML ~~LOC~~ SOLN
40.0000 mg | SUBCUTANEOUS | Status: DC
  Administered 2014-11-17 – 2014-11-21 (×5): 40 mg via SUBCUTANEOUS
  Filled 2014-11-17 (×6): qty 0.4

## 2014-11-17 MED ORDER — QUINAPRIL HCL 10 MG PO TABS: 20.0000 mg | ORAL_TABLET | Freq: Every day | ORAL | Status: DC

## 2014-11-17 MED ORDER — SODIUM CHLORIDE 0.9 % IV BOLUS (SEPSIS): 500.0000 mL | Freq: Once | INTRAVENOUS | Status: DC

## 2014-11-17 MED ORDER — ONDANSETRON HCL 4 MG/2ML IJ SOLN: 4.0000 mg | Freq: Four times a day (QID) | INTRAMUSCULAR | Status: DC | PRN

## 2014-11-17 MED ORDER — LISINOPRIL 20 MG PO TABS
20.0000 mg | ORAL_TABLET | Freq: Every day | ORAL | Status: DC
  Administered 2014-11-17 – 2014-11-22 (×6): 20 mg via ORAL
  Filled 2014-11-17 (×6): qty 1

## 2014-11-17 MED ORDER — SODIUM CHLORIDE 0.9 % IJ SOLN: 3.0000 mL | INTRAMUSCULAR | Status: DC | PRN

## 2014-11-17 MED ORDER — SODIUM CHLORIDE 0.9 % IV SOLN: 250.0000 mL | INTRAVENOUS | Status: DC | PRN

## 2014-11-17 MED ORDER — ONDANSETRON HCL 4 MG PO TABS: 4.0000 mg | ORAL_TABLET | Freq: Four times a day (QID) | ORAL | Status: DC | PRN

## 2014-11-17 MED ORDER — CIPROFLOXACIN IN D5W 400 MG/200ML IV SOLN
400.0000 mg | Freq: Two times a day (BID) | INTRAVENOUS | Status: DC
  Administered 2014-11-17 – 2014-11-20 (×5): 400 mg via INTRAVENOUS
  Filled 2014-11-17 (×7): qty 200

## 2014-11-17 NOTE — ED Notes (Signed)
Bed: WA01 Expected date:  Expected time:  Means of arrival:  Comments: Hold for triage 2

## 2014-11-17 NOTE — ED Notes (Signed)
Pt in restroom attempting to obtain urine specimen

## 2014-11-17 NOTE — ED Provider Notes (Signed)
CSN: [id number]     Arrival date & time 11/17/14  1005 History   First MD Initiated Contact with Patient 11/17/14 1044     Chief Complaint  Patient presents with  . Dysuria    over last few days  . Constipation  . Hyperglycemia  . Fever    intermittent elevation     (Consider location/radiation/quality/duration/timing/severity/associated sxs/prior Treatment) Patient is a 74 y.o. male presenting with dysuria, constipation, hyperglycemia, and fever. The history is provided by the patient (the pt complains of dysuria and weakness).  Dysuria This is a new problem. The current episode started 1 to 2 hours ago. The problem occurs constantly. The problem has not changed since onset.Pertinent negatives include no chest pain, no abdominal pain and no headaches. Nothing aggravates the symptoms. Nothing relieves the symptoms. He has tried nothing for the symptoms.  Constipation Associated symptoms: dysuria and fever   Associated symptoms: no abdominal pain, no back pain and no diarrhea   Hyperglycemia Associated symptoms: dysuria and fever   Associated symptoms: no abdominal pain, no chest pain and no fatigue   Fever Associated symptoms: dysuria   Associated symptoms: no chest pain, no congestion, no cough, no diarrhea, no headaches and no rash     Past Medical History  Diagnosis Date  . Hypertension   . Hypercholesterolemia   . Diabetes mellitus   . Kidney stones    Past Surgical History  Procedure Laterality Date  . Eye surgery    . Rotator cuff repair      Right  . Prostate biopsy     Family History  Problem Relation Age of Onset  . Family history unknown: Yes   History  Substance Use Topics  . Smoking status: Never Smoker   . Smokeless tobacco: Never Used  . Alcohol Use: No    Review of Systems  Constitutional: Positive for fever. Negative for appetite change and fatigue.  HENT: Negative for congestion, ear discharge and sinus pressure.   Eyes: Negative for  discharge.  Respiratory: Negative for cough.   Cardiovascular: Negative for chest pain.  Gastrointestinal: Positive for constipation. Negative for abdominal pain and diarrhea.  Genitourinary: Positive for dysuria. Negative for frequency and hematuria.  Musculoskeletal: Negative for back pain.  Skin: Negative for rash.  Neurological: Negative for seizures and headaches.  Psychiatric/Behavioral: Negative for hallucinations.      Allergies  Review of patient's allergies indicates no known allergies.  Home Medications   Prior to Admission medications   Medication Sig Start Date End Date Taking? Authorizing Provider  acetaminophen (TYLENOL) 500 MG tablet Take 1,000 mg by mouth every 6 (six) hours as needed for mild pain or headache.   Yes Historical Provider, MD  amLODipine (NORVASC) 5 MG tablet Take 5 mg by mouth daily.     Yes Historical Provider, MD  Ascorbic Acid (VITAMIN C) 1000 MG tablet Take 1,000 mg by mouth daily.   Yes Historical Provider, MD  atorvastatin (LIPITOR) 20 MG tablet Take 20 mg by mouth daily.     Yes Historical Provider, MD  glimepiride (AMARYL) 2 MG tablet Take 2 mg by mouth 2 (two) times daily.     Yes Historical Provider, MD  latanoprost (XALATAN) 0.005 % ophthalmic solution Place 1 drop into both eyes at bedtime.   Yes Historical Provider, MD  quinapril (ACCUPRIL) 20 MG tablet Take 20 mg by mouth daily.   Yes Historical Provider, MD  ondansetron (ZOFRAN) 8 MG tablet Take 1 tablet (8 mg total) by  mouth every 8 (eight) hours as needed for nausea or vomiting. Patient not taking: Reported on 11/17/2014 02/08/14   Arville Lime Schinlever, PA-C  oxyCODONE-acetaminophen (PERCOCET/ROXICET) 5-325 MG per tablet Take 1 tablet by mouth every 4 (four) hours as needed for severe pain. Patient not taking: Reported on 11/17/2014 02/08/14   Arville Lime Schinlever, PA-C  tamsulosin (FLOMAX) 0.4 MG CAPS capsule Take 1 capsule (0.4 mg total) by mouth at bedtime. Patient not taking:  Reported on 11/17/2014 02/08/14   Arville Lime Schinlever, PA-C   BP 143/74 mmHg  Pulse 86  Temp(Src) 98.9 F (37.2 C) (Oral)  Resp 18  Wt 180 lb (81.647 kg)  SpO2 99% Physical Exam  Constitutional: He is oriented to person, place, and time. He appears well-developed.  HENT:  Head: Normocephalic.  Eyes: Conjunctivae and EOM are normal. No scleral icterus.  Neck: Neck supple. No thyromegaly present.  Cardiovascular: Normal rate and regular rhythm.  Exam reveals no gallop and no friction rub.   No murmur heard. Pulmonary/Chest: No stridor. He has no wheezes. He has no rales. He exhibits no tenderness.  Abdominal: He exhibits no distension. There is no tenderness. There is no rebound.  Musculoskeletal: Normal range of motion. He exhibits no edema.  Lymphadenopathy:    He has no cervical adenopathy.  Neurological: He is oriented to person, place, and time. He exhibits normal muscle tone. Coordination normal.  Skin: No rash noted. No erythema.  Psychiatric: He has a normal mood and affect. His behavior is normal.    ED Course  Procedures (including critical care time) Labs Review Labs Reviewed  URINALYSIS, ROUTINE W REFLEX MICROSCOPIC - Abnormal; Notable for the following:    Color, Urine AMBER (*)    APPearance CLOUDY (*)    Hgb urine dipstick MODERATE (*)    Protein, ur 30 (*)    Leukocytes, UA LARGE (*)    All other components within normal limits  CBC WITH DIFFERENTIAL/PLATELET - Abnormal; Notable for the following:    WBC 27.6 (*)    Hemoglobin 12.1 (*)    HCT 37.6 (*)    Neutrophils Relative % 91 (*)    Lymphocytes Relative 5 (*)    Neutro Abs 25.1 (*)    Monocytes Absolute 1.1 (*)    All other components within normal limits  COMPREHENSIVE METABOLIC PANEL - Abnormal; Notable for the following:    Glucose, Bld 195 (*)    Albumin 3.4 (*)    GFR calc non Af Amer 55 (*)    GFR calc Af Amer 64 (*)    All other components within normal limits  CBG MONITORING, ED -  Abnormal; Notable for the following:    Glucose-Capillary 164 (*)    All other components within normal limits  URINE CULTURE  URINE MICROSCOPIC-ADD ON    Imaging Review No results found.   EKG Interpretation None      MDM   Final diagnoses:  UTI (lower urinary tract infection)    Admit uti   Maudry Diego, MD 11/17/14 1257

## 2014-11-17 NOTE — ED Notes (Signed)
Initial Contact - pt A+Ox4, resting on stretcher with visitor at bedside, pt reports prostate biopsy earlier this month and now with c/o having difficulty urinating, also constipation, fevers and elevated blood sugars.  Pt denies pain.  Skin PWD.  Speaking full/clear sentences.  MAEI.  NAD.

## 2014-11-17 NOTE — ED Notes (Signed)
Pt reports difficulty urinating, constipation, elevated temperature (101.0) and elevated blood sugar ( over 200).Symptoms increased over last 4 days.  Referred to ED by urology.Bioppsy of prostate 11/04/14. Denies weakness, dizziness or burning on urination. Pt is alert , oriented ans appropriate. Friend at bedside

## 2014-11-17 NOTE — H&P (Signed)
[facility] History and Physical  Mikey Maffett GHW:[id number] DOB: 09/18/1941 DOA: 11/17/2014   PCP: Andria Frames, MD    Chief Complaint:   HPI: Tony Price is a 74 y.o. male with past medical history of diabetes mellitus and hypertension who underwent a prostate biopsy on 1/28. 2 days ago he started developing fever, uncontrolled sugar and was unable to urinate or having a bowel movement. His girlfriend asked him to come to the hospital yesterday but he did not come. He decided to come today because symptoms did not resolve. Temp was 101. No significant dysuria. He is urinating well now but still constipated.    General: The patient denies anorexia,  weight loss Cardiac: Denies chest pain, syncope, palpitations, pedal edema  Respiratory: Denies cough, shortness of breath, wheezing GI: Denies severe indigestion/heartburn, abdominal pain, nausea, vomiting, diarrhea + constipation GU: Denies hematuria, incontinence, dysuria  Musculoskeletal: Denies arthritis  Skin: Denies suspicious skin lesions Neurologic: Denies focal weakness or numbness, change in vision Psychiatry: Denies depression or anxiety. Hematologic: no easy bruising or bleeding   Past Medical History  Diagnosis Date  . Hypertension   . Hypercholesterolemia   . Diabetes mellitus   . Kidney stones     Past Surgical History  Procedure Laterality Date  . Eye surgery    . Rotator cuff repair      Right  . Prostate biopsy      Social History: does not smoke or drink alcohol Lives at home with son and girlfriend    No Known Allergies  Family History  Problem Relation Age of Onset  . Family history unknown: Yes     Prior to Admission medications   Medication Sig Start Date End Date Taking? Authorizing Provider  acetaminophen (TYLENOL) 500 MG tablet Take 1,000 mg by mouth every 6 (six) hours as needed for mild pain or headache.   Yes Historical Provider, MD  amLODipine (NORVASC) 5 MG tablet Take  5 mg by mouth daily.     Yes Historical Provider, MD  Ascorbic Acid (VITAMIN C) 1000 MG tablet Take 1,000 mg by mouth daily.   Yes Historical Provider, MD  atorvastatin (LIPITOR) 20 MG tablet Take 20 mg by mouth daily.     Yes Historical Provider, MD  glimepiride (AMARYL) 2 MG tablet Take 2 mg by mouth 2 (two) times daily.     Yes Historical Provider, MD  latanoprost (XALATAN) 0.005 % ophthalmic solution Place 1 drop into both eyes at bedtime.   Yes Historical Provider, MD  quinapril (ACCUPRIL) 20 MG tablet Take 20 mg by mouth daily.   Yes Historical Provider, MD  ondansetron (ZOFRAN) 8 MG tablet Take 1 tablet (8 mg total) by mouth every 8 (eight) hours as needed for nausea or vomiting. Patient not taking: Reported on 11/17/2014 02/08/14   Arville Lime Schinlever, PA-C  oxyCODONE-acetaminophen (PERCOCET/ROXICET) 5-325 MG per tablet Take 1 tablet by mouth every 4 (four) hours as needed for severe pain. Patient not taking: Reported on 11/17/2014 02/08/14   Arville Lime Schinlever, PA-C  tamsulosin (FLOMAX) 0.4 MG CAPS capsule Take 1 capsule (0.4 mg total) by mouth at bedtime. Patient not taking: Reported on 11/17/2014 02/08/14   Remer Macho, PA-C     Physical Exam: Filed Vitals:   11/17/14 1028 11/17/14 1204 11/17/14 1325 11/17/14 1350  BP: 133/69 143/74 142/71 147/66  Pulse: 89 86 90 90  Temp: 98.9 F (37.2 C)   100.5 F (38.1 C)  TempSrc: Oral   Oral  Resp:  20 18 16 18   Height:    5\' 6"  (1.676 m)  Weight: 81.647 kg (180 lb)   81.92 kg (180 lb 9.6 oz)  SpO2: 96% 99% 98% 100%     General: AAO x3, no distress HEENT: Normocephalic and Atraumatic, Mucous membranes pink                PERRLA; EOM intact; No scleral icterus,                 Nares: Patent, Oropharynx: Clear, Fair Dentition                 Neck: FROM, no cervical lymphadenopathy, thyromegaly, carotid bruit or JVD;  Breasts: deferred CHEST WALL: No tenderness  CHEST: Normal respiration, clear to auscultation  bilaterally  HEART: Regular rate and rhythm; no murmurs rubs or gallops  BACK: No kyphosis or scoliosis; no CVA tenderness  GI: Positive Bowel Sounds, soft, non-tender; no masses, no organomegaly Rectal Exam: deferred MSK: No cyanosis, clubbing, or edema Genitalia: not examined  SKIN:  no rash or ulceration  CNS: Alert and Oriented x 4, Nonfocal exam, CN 2-12 intact  Labs on Admission:  Basic Metabolic Panel:  Recent Labs Lab 11/17/14 1110  NA 138  K 3.7  CL 106  CO2 26  GLUCOSE 195*  BUN 18  CREATININE 1.25  CALCIUM 8.5   Liver Function Tests:  Recent Labs Lab 11/17/14 1110  AST 29  ALT 30  ALKPHOS 79  BILITOT 0.8  PROT 6.8  ALBUMIN 3.4*   No results for input(s): LIPASE, AMYLASE in the last 168 hours. No results for input(s): AMMONIA in the last 168 hours. CBC:  Recent Labs Lab 11/17/14 1110  WBC 27.6*  NEUTROABS 25.1*  HGB 12.1*  HCT 37.6*  MCV 82.6  PLT 226   Cardiac Enzymes: No results for input(s): CKTOTAL, CKMB, CKMBINDEX, TROPONINI in the last 168 hours.  BNP (last 3 results) No results for input(s): PROBNP in the last 8760 hours. CBG:  Recent Labs Lab 11/17/14 1041  GLUCAP 164*    Radiological Exams on Admission: No results found.   Assessment/Plan Principal Problem:   Sepsis- fever/ leukocytosis--  UTI (lower urinary tract infection) - s/p prostate biopsy - cont Cipro- f/u blood and urine cultures  Active Problems:  Dehydration  - per lab work - will give a bolus of 500 cc and continuous fluids    DM (diabetes mellitus), type 2 - start sliding scale Novolog and diabetic diet - Hold Actos    HTN (hypertension) - Cont Quinapril and Norvasc  Consulted:   Code Status: Full code Family Communication: significant other and son  DVT Prophylaxis:Lovenox  Time spent: 48 min   [place], MD [facility]  If 7PM-7AM, please contact night-coverage [email or web address] 11/17/2014, 2:55 PM

## 2014-11-18 LAB — BASIC METABOLIC PANEL
Anion gap: 8 (ref 5–15)
BUN: 18 mg/dL (ref 6–23)
CO2: 24 mmol/L (ref 19–32)
Calcium: 8.3 mg/dL — ABNORMAL LOW (ref 8.4–10.5)
Chloride: 108 mmol/L (ref 96–112)
Creatinine, Ser: 1.28 mg/dL (ref 0.50–1.35)
GFR calc Af Amer: 62 mL/min — ABNORMAL LOW (ref >90)
GFR calc non Af Amer: 54 mL/min — ABNORMAL LOW (ref >90)
GLUCOSE: 191 mg/dL — AB (ref 70–99)
POTASSIUM: 3.9 mmol/L (ref 3.5–5.1)
SODIUM: 140 mmol/L (ref 135–145)

## 2014-11-18 LAB — GLUCOSE, CAPILLARY
GLUCOSE-CAPILLARY: 185 mg/dL — AB (ref 70–99)
Glucose-Capillary: 185 mg/dL — ABNORMAL HIGH (ref 70–99)
Glucose-Capillary: 105 mg/dL — ABNORMAL HIGH (ref 70–99)

## 2014-11-18 LAB — CBC
HEMATOCRIT: 35.6 % — AB (ref 39.0–52.0)
HEMOGLOBIN: 11.4 g/dL — AB (ref 13.0–17.0)
MCH: 26.3 pg (ref 26.0–34.0)
MCHC: 32 g/dL (ref 30.0–36.0)
MCV: 82.2 fL (ref 78.0–100.0)
PLATELETS: 208 10*3/uL (ref 150–400)
RBC: 4.33 MIL/uL (ref 4.22–5.81)
RDW: 14.6 % (ref 11.5–15.5)
WBC: 25.9 10*3/uL — ABNORMAL HIGH (ref 4.0–10.5)

## 2014-11-18 MED ORDER — SENNOSIDES-DOCUSATE SODIUM 8.6-50 MG PO TABS
1.0000 | ORAL_TABLET | Freq: Two times a day (BID) | ORAL | Status: DC
  Administered 2014-11-18 – 2014-11-21 (×8): 1 via ORAL
  Filled 2014-11-18 (×10): qty 1

## 2014-11-18 NOTE — Progress Notes (Signed)
[facility] PROGRESS NOTE  Tony Price CVE:[id number] DOB: December 18, 1940 DOA: 11/17/2014 PCP: Andria Frames, MD  Assessment/Plan: 1. UTI-patient is status post prostate biopsy, developed UTI and patient started on ciprofloxacin. Will entail with Cipro and await the urine culture results. 2. Diabetes mellitus- continue sliding scale insulin with NovoLog, diabetic diet 3. Constipation- we'll start Senokot S  1 tablet twice a day. Continue daily MiraLAX 4. Hypertension- blood pressure control, continue quinapril and Norvasc  Code Status: Full code Family Communication: *Discussed with wife at bedside Disposition Plan: Home when stable   Consultants:  None  Procedures:  None  Antibiotics:  Cipro  HPI/Subjective: 74 y.o. male with past medical history of diabetes mellitus and hypertension who underwent a prostate biopsy on 1/28. 2 days ago he started developing fever, uncontrolled sugar and was unable to urinate or having a bowel movement. His girlfriend asked him to come to the hospital yesterday but he did not come. He decided to come today because symptoms did not resolve. Temp was 101. No significant dysuria.   Patient feeling better, though still has constipation. Was started on MiraLAX yesterday  Objective: Filed Vitals:   11/18/14 0954  BP: 136/59  Pulse:   Temp:   Resp:     Intake/Output Summary (Last 24 hours) at 11/18/14 1604 Last data filed at 11/18/14 0800  Gross per 24 hour  Intake   1260 ml  Output      0 ml  Net   1260 ml   Filed Weights   11/17/14 1028 11/17/14 1350  Weight: 81.647 kg (180 lb) 81.92 kg (180 lb 9.6 oz)    Exam:  Physical Exam: Eyes: No icterus, extraocular muscles intact  Lungs: Normal respiratory effort, bilateral clear to auscultation, no crackles or wheezes.  Heart: Regular rate and rhythm, S1 and S2 normal, no murmurs, rubs auscultated Abdomen: BS normoactive,soft,nondistended,non-tender to palpation,no  organomegaly Extremities: No pretibial edema, no erythema, no cyanosis, no clubbing Neuro : Alert and oriented to time, place and person, No focal deficits   Data Reviewed: Basic Metabolic Panel:  Recent Labs Lab 11/17/14 1110 11/18/14 0450  NA 138 140  K 3.7 3.9  CL 106 108  CO2 26 24  GLUCOSE 195* 191*  BUN 18 18  CREATININE 1.25 1.28  CALCIUM 8.5 8.3*   Liver Function Tests:  Recent Labs Lab 11/17/14 1110  AST 29  ALT 30  ALKPHOS 79  BILITOT 0.8  PROT 6.8  ALBUMIN 3.4*   No results for input(s): LIPASE, AMYLASE in the last 168 hours. No results for input(s): AMMONIA in the last 168 hours. CBC:  Recent Labs Lab 11/17/14 1110 11/18/14 0450  WBC 27.6* 25.9*  NEUTROABS 25.1*  --   HGB 12.1* 11.4*  HCT 37.6* 35.6*  MCV 82.6 82.2  PLT 226 208   Cardiac Enzymes: No results for input(s): CKTOTAL, CKMB, CKMBINDEX, TROPONINI in the last 168 hours. BNP (last 3 results) No results for input(s): BNP in the last 8760 hours.  ProBNP (last 3 results) No results for input(s): PROBNP in the last 8760 hours.  CBG:  Recent Labs Lab 11/17/14 1041 11/17/14 1729 11/17/14 2151 11/18/14 0759 11/18/14 1140  GLUCAP 164* 181* 144* 185* 185*    No results found for this or any previous visit (from the past 240 hour(s)).   Studies: No results found.  Scheduled Meds: . amLODipine  5 mg Oral Daily  . atorvastatin  20 mg Oral Daily  . ciprofloxacin  400 mg Intravenous Q12H  .  enoxaparin (LOVENOX) injection  40 mg Subcutaneous Q24H  . insulin aspart  0-15 Units Subcutaneous TID WC  . insulin aspart  0-5 Units Subcutaneous QHS  . latanoprost  1 drop Both Eyes QHS  . lisinopril  20 mg Oral Daily  . polyethylene glycol  17 g Oral Daily  . senna-docusate  1 tablet Oral BID  . sodium chloride  500 mL Intravenous Once  . sodium chloride  3 mL Intravenous Q12H   Continuous Infusions: . sodium chloride 75 mL/hr at 11/18/14 1453    Principal Problem:    Sepsis Active Problems:   UTI (lower urinary tract infection)   DM (diabetes mellitus), type 2   HTN (hypertension)    Time spent: 25 min    [facility] Pager [phone]. If 7PM-7AM, please contact night-coverage at [email or web address], password [facility] [place] 11/18/2014, 4:04 PM  LOS: 1 day

## 2014-11-19 LAB — CBC
HCT: 33 % — ABNORMAL LOW (ref 39.0–52.0)
Hemoglobin: 10.7 g/dL — ABNORMAL LOW (ref 13.0–17.0)
MCH: 26.3 pg (ref 26.0–34.0)
MCHC: 32.4 g/dL (ref 30.0–36.0)
MCV: 81.1 fL (ref 78.0–100.0)
PLATELETS: 223 10*3/uL (ref 150–400)
RBC: 4.07 MIL/uL — ABNORMAL LOW (ref 4.22–5.81)
RDW: 14.5 % (ref 11.5–15.5)
WBC: 16.4 10*3/uL — ABNORMAL HIGH (ref 4.0–10.5)

## 2014-11-19 LAB — GLUCOSE, CAPILLARY
GLUCOSE-CAPILLARY: 161 mg/dL — AB (ref 70–99)
GLUCOSE-CAPILLARY: 144 mg/dL — AB (ref 70–99)
Glucose-Capillary: 161 mg/dL — ABNORMAL HIGH (ref 70–99)
Glucose-Capillary: 161 mg/dL — ABNORMAL HIGH (ref 70–99)
Glucose-Capillary: 166 mg/dL — ABNORMAL HIGH (ref 70–99)

## 2014-11-19 LAB — BASIC METABOLIC PANEL
Anion gap: 6 (ref 5–15)
BUN: 14 mg/dL (ref 6–23)
CHLORIDE: 109 mmol/L (ref 96–112)
CO2: 25 mmol/L (ref 19–32)
Calcium: 7.9 mg/dL — ABNORMAL LOW (ref 8.4–10.5)
Creatinine, Ser: 1.06 mg/dL (ref 0.50–1.35)
GFR calc non Af Amer: 68 mL/min — ABNORMAL LOW (ref >90)
GFR, EST AFRICAN AMERICAN: 78 mL/min — AB (ref >90)
Glucose, Bld: 168 mg/dL — ABNORMAL HIGH (ref 70–99)
Potassium: 3.6 mmol/L (ref 3.5–5.1)
SODIUM: 140 mmol/L (ref 135–145)

## 2014-11-19 MED ORDER — BENZONATATE 100 MG PO CAPS
100.0000 mg | ORAL_CAPSULE | Freq: Two times a day (BID) | ORAL | Status: DC | PRN
  Administered 2014-11-19: 100 mg via ORAL
  Filled 2014-11-19: qty 1

## 2014-11-19 NOTE — Progress Notes (Signed)
[facility] PROGRESS NOTE  Tony Price RTM:[id number] DOB: [birth year] DOA: 11/17/2014 PCP: Andria Frames, MD  Assessment/Plan: 1. UTI-patient is status post prostate biopsy, developed UTI and patient started on ciprofloxacin. Will continue with  with Cipro and await the urine culture results. 2. Diabetes mellitus- continue sliding scale insulin with NovoLog, diabetic diet 3. Constipation- resolved, continue  Senokot S  1 tablet twice a day. Continue daily MiraLAX 4. Hypertension- blood pressure control, continue quinapril and Norvasc 5. DVT prophylaxis- Lovenox  Code Status: Full code Family Communication: *Discussed with wife at bedside Disposition Plan: Home when stable   Consultants:  None  Procedures:  None  Antibiotics:  Cipro  HPI/Subjective: 74 y.o. male with past medical history of diabetes mellitus and hypertension who underwent a prostate biopsy on 1/28. 2 days ago he started developing fever, uncontrolled sugar and was unable to urinate or having a bowel movement. His girlfriend asked him to come to the hospital yesterday but he did not come. He decided to come today because symptoms did not resolve. Temp was 101. No significant dysuria.   Patient feeling better after he had bowel movement last night. WBC down to 16,000. Patient has been afebrile  Objective: Filed Vitals:   11/19/14 1023  BP: 146/73  Pulse: 78  Temp: 99.4 F (37.4 C)  Resp: 20    Intake/Output Summary (Last 24 hours) at 11/19/14 1245 Last data filed at 11/18/14 1900  Gross per 24 hour  Intake    600 ml  Output      0 ml  Net    600 ml   Filed Weights   11/17/14 1028 11/17/14 1350  Weight: 81.647 kg (180 lb) 81.92 kg (180 lb 9.6 oz)    Exam:  Physical Exam: Eyes: No icterus, extraocular muscles intact  Lungs: Normal respiratory effort, bilateral clear to auscultation, no crackles or wheezes.  Heart: Regular rate and rhythm, S1 and S2 normal, no murmurs, rubs  auscultated Abdomen: BS normoactive,soft,nondistended,non-tender to palpation,no organomegaly Extremities: No pretibial edema, no erythema, no cyanosis, no clubbing Neuro : Alert and oriented to time, place and person, No focal deficits   Data Reviewed: Basic Metabolic Panel:  Recent Labs Lab 11/17/14 1110 11/18/14 0450 11/19/14 0425  NA 138 140 140  K 3.7 3.9 3.6  CL 106 108 109  CO2 26 24 25   GLUCOSE 195* 191* 168*  BUN 18 18 14   CREATININE 1.25 1.28 1.06  CALCIUM 8.5 8.3* 7.9*   Liver Function Tests:  Recent Labs Lab 11/17/14 1110  AST 29  ALT 30  ALKPHOS 79  BILITOT 0.8  PROT 6.8  ALBUMIN 3.4*   No results for input(s): LIPASE, AMYLASE in the last 168 hours. No results for input(s): AMMONIA in the last 168 hours. CBC:  Recent Labs Lab 11/17/14 1110 11/18/14 0450 11/19/14 0425  WBC 27.6* 25.9* 16.4*  NEUTROABS 25.1*  --   --   HGB 12.1* 11.4* 10.7*  HCT 37.6* 35.6* 33.0*  MCV 82.6 82.2 81.1  PLT 226 208 223   Cardiac Enzymes: No results for input(s): CKTOTAL, CKMB, CKMBINDEX, TROPONINI in the last 168 hours. BNP (last 3 results) No results for input(s): BNP in the last 8760 hours.  ProBNP (last 3 results) No results for input(s): PROBNP in the last 8760 hours.  CBG:  Recent Labs Lab 11/17/14 2151 11/18/14 0759 11/18/14 1140 11/18/14 1708 11/19/14 1216  GLUCAP 144* 185* 185* 105* 161*    Recent Results (from the past 240 hour(s))  Urine  culture     Status: None (Preliminary result)   Collection Time: 11/17/14 12:43 PM  Result Value Ref Range Status   Specimen Description URINE, RANDOM  Final   Special Requests Normal  Final   Colony Count   Final    70,000 COLONIES/ML Performed at [facility]    Culture   Final    ESCHERICHIA COLI Performed at [facility]    Report Status PENDING  Incomplete     Studies: No results found.  Scheduled Meds: . amLODipine  5 mg Oral Daily  . atorvastatin  20 mg Oral Daily  .  ciprofloxacin  400 mg Intravenous Q12H  . enoxaparin (LOVENOX) injection  40 mg Subcutaneous Q24H  . insulin aspart  0-15 Units Subcutaneous TID WC  . insulin aspart  0-5 Units Subcutaneous QHS  . latanoprost  1 drop Both Eyes QHS  . lisinopril  20 mg Oral Daily  . polyethylene glycol  17 g Oral Daily  . senna-docusate  1 tablet Oral BID  . sodium chloride  500 mL Intravenous Once  . sodium chloride  3 mL Intravenous Q12H   Continuous Infusions: . sodium chloride 10 mL/hr at 11/19/14 0848    Principal Problem:   Sepsis Active Problems:   UTI (lower urinary tract infection)   DM (diabetes mellitus), type 2   HTN (hypertension)    Time spent: 25 min    [facility] Pager [phone]. If 7PM-7AM, please contact night-coverage at [email or web address], password [facility] 11/19/2014, 12:45 PM  LOS: 2 days

## 2014-11-19 NOTE — Care Management Note (Signed)
CARE MANAGEMENT NOTE 11/19/2014  Patient:  Tony Price, Tony Price   Account Number:  [id number]  Date Initiated:  11/18/2014  Documentation initiated by:  [name]  Subjective/Objective Assessment:   74 yo male admitted with fevers, UTI, sepsis. PTA lived at home with girlfriend.     Action/Plan:   Home when stable   Anticipated DC Date:  11/21/2014   Anticipated DC Plan:  [place]  CM consult      Choice offered to / List presented to:             Status of service:  Completed, signed off Medicare Important Message given?   (If response is "NO", the following Medicare IM given date fields will be blank) Date Medicare IM given:   Medicare IM given by:   Date Additional Medicare IM given:   Additional Medicare IM given by:    Discharge Disposition:  HOME/SELF CARE  Per UR Regulation:  Reviewed for med. necessity/level of care/duration of stay  If discussed at [place] of Stay Meetings, dates discussed:    Comments:

## 2014-11-20 DIAGNOSIS — A4151 Sepsis due to Escherichia coli [E. coli]: Secondary | ICD-10-CM

## 2014-11-20 DIAGNOSIS — E1100 Type 2 diabetes mellitus with hyperosmolarity without nonketotic hyperglycemic-hyperosmolar coma (NKHHC): Secondary | ICD-10-CM

## 2014-11-20 LAB — GLUCOSE, CAPILLARY
Glucose-Capillary: 181 mg/dL — ABNORMAL HIGH (ref 70–99)
Glucose-Capillary: 117 mg/dL — ABNORMAL HIGH (ref 70–99)
Glucose-Capillary: 117 mg/dL — ABNORMAL HIGH (ref 70–99)
Glucose-Capillary: 105 mg/dL — ABNORMAL HIGH (ref 70–99)

## 2014-11-20 LAB — URINE CULTURE: SPECIAL REQUESTS: NORMAL

## 2014-11-20 MED ORDER — SODIUM CHLORIDE 0.9 % IV SOLN
500.0000 mg | Freq: Three times a day (TID) | INTRAVENOUS | Status: DC
  Administered 2014-11-20: 500 mg via INTRAVENOUS
  Filled 2014-11-20 (×2): qty 500

## 2014-11-20 MED ORDER — QUINAPRIL HCL 10 MG PO TABS
20.0000 mg | ORAL_TABLET | Freq: Every day | ORAL | Status: DC
Start: 2014-11-20 — End: 2014-11-20

## 2014-11-20 MED ORDER — GLIMEPIRIDE 1 MG PO TABS
1.0000 mg | ORAL_TABLET | Freq: Two times a day (BID) | ORAL | Status: DC
  Administered 2014-11-20 – 2014-11-22 (×5): 1 mg via ORAL
  Filled 2014-11-20 (×7): qty 1

## 2014-11-20 MED ORDER — TAMSULOSIN HCL 0.4 MG PO CAPS
0.4000 mg | ORAL_CAPSULE | Freq: Every day | ORAL | Status: DC
  Administered 2014-11-20 – 2014-11-21 (×2): 0.4 mg via ORAL
  Filled 2014-11-20 (×3): qty 1

## 2014-11-20 MED ORDER — SODIUM CHLORIDE 0.9 % IV SOLN
1.0000 g | INTRAVENOUS | Status: DC
  Administered 2014-11-20 – 2014-11-22 (×3): 1 g via INTRAVENOUS
  Filled 2014-11-20 (×3): qty 1

## 2014-11-20 MED ORDER — OXYCODONE-ACETAMINOPHEN 5-325 MG PO TABS: 1.0000 | ORAL_TABLET | ORAL | Status: DC | PRN

## 2014-11-20 NOTE — Progress Notes (Addendum)
[facility] PROGRESS NOTE  Assessment/Plan: Sepsis due to  UTI (lower urinary tract infection) - UA showed ESBL Escherichia coli, sensitive to Primaxin. - We'll switch Primaxin to Invanz as his once every 24 hours. - Spike a fever check blood cultures x 2.  DM (diabetes mellitus), type 2: - Moderate controlled currently just a sliding scale, will resume Amaryl at a lower dose.  Essential  HTN (hypertension): - Resume medications. Almost at goal.  Code Status: Full code Family Communication: Discussed with wife at bedside Disposition Plan: awaiting PICC line insert  Consultants:  none  Procedures:  UC ESBL E. coli  Antibiotics:  Primaxin  HPI/Subjective: No complains.  Objective: Filed Vitals:   11/19/14 1400 11/19/14 2100 11/19/14 2328 11/20/14 0530  BP: 151/83 160/73  136/58  Pulse: 78 83  80  Temp: 100.6 F (38.1 C) 100.8 F (38.2 C) 100.1 F (37.8 C) 100 F (37.8 C)  TempSrc: Oral Oral Oral Oral  Resp: 20 20  20   Height:      Weight:      SpO2: 100% 98%  99%    Intake/Output Summary (Last 24 hours) at 11/20/14 1017 Last data filed at 11/20/14 0600  Gross per 24 hour  Intake    720 ml  Output   1125 ml  Net   -405 ml   Filed Weights   11/17/14 1028 11/17/14 1350  Weight: 81.647 kg (180 lb) 81.92 kg (180 lb 9.6 oz)    Exam:  General: Alert, awake, oriented x3, in no acute distress.  HEENT: No bruits, no goiter.  Heart: Regular rate and rhythm. Lungs: Good air movement, clear Abdomen: Soft, nontender, nondistended, positive bowel sounds.  Neuro: Grossly intact, nonfocal.   Data Reviewed: Basic Metabolic Panel:  Recent Labs Lab 11/17/14 1110 11/18/14 0450 11/19/14 0425  NA 138 140 140  K 3.7 3.9 3.6  CL 106 108 109  CO2 26 24 25   GLUCOSE 195* 191* 168*  BUN 18 18 14   CREATININE 1.25 1.28 1.06  CALCIUM 8.5 8.3* 7.9*   Liver Function Tests:  Recent Labs Lab 11/17/14 1110  AST 29  ALT 30  ALKPHOS 79  BILITOT 0.8    PROT 6.8  ALBUMIN 3.4*   No results for input(s): LIPASE, AMYLASE in the last 168 hours. No results for input(s): AMMONIA in the last 168 hours. CBC:  Recent Labs Lab 11/17/14 1110 11/18/14 0450 11/19/14 0425  WBC 27.6* 25.9* 16.4*  NEUTROABS 25.1*  --   --   HGB 12.1* 11.4* 10.7*  HCT 37.6* 35.6* 33.0*  MCV 82.6 82.2 81.1  PLT 226 208 223   Cardiac Enzymes: No results for input(s): CKTOTAL, CKMB, CKMBINDEX, TROPONINI in the last 168 hours. BNP (last 3 results) No results for input(s): BNP in the last 8760 hours.  ProBNP (last 3 results) No results for input(s): PROBNP in the last 8760 hours.  CBG:  Recent Labs Lab 11/19/14 0738 11/19/14 1216 11/19/14 1725 11/19/14 2108 11/20/14 0740  GLUCAP 161* 161* 144* 166* 181*    Recent Results (from the past 240 hour(s))  Urine culture     Status: None   Collection Time: 11/17/14 12:43 PM  Result Value Ref Range Status   Specimen Description URINE, RANDOM  Final   Special Requests Normal  Final   Colony Count   Final    70,000 COLONIES/ML Performed at [facility]    Culture   Final    ESCHERICHIA COLI Note: Confirmed Extended Spectrum  Beta-Lactamase Producer (ESBL) CRITICAL RESULT CALLED TO, READ BACK BY AND VERIFIED WITH: NIALTA MAYFIELD AT 3:58 A.M. ON 11/20/2014 WARRB Performed at [facility]    Report Status 11/20/2014 FINAL  Final   Organism ID, Bacteria ESCHERICHIA COLI  Final      Susceptibility   Escherichia coli - MIC*    AMPICILLIN >=32 RESISTANT Resistant     CEFAZOLIN >=64 RESISTANT Resistant     CEFTRIAXONE >=64 RESISTANT Resistant     CIPROFLOXACIN >=4 RESISTANT Resistant     GENTAMICIN >=16 RESISTANT Resistant     LEVOFLOXACIN >=8 RESISTANT Resistant     NITROFURANTOIN <=16 SENSITIVE Sensitive     TOBRAMYCIN >=16 RESISTANT Resistant     TRIMETH/SULFA <=20 SENSITIVE Sensitive     IMIPENEM <=0.25 SENSITIVE Sensitive     PIP/TAZO 8 SENSITIVE Sensitive     * ESCHERICHIA COLI      Studies: No results found.  Scheduled Meds: . amLODipine  5 mg Oral Daily  . atorvastatin  20 mg Oral Daily  . enoxaparin (LOVENOX) injection  40 mg Subcutaneous Q24H  . ertapenem  1 g Intravenous Q24H  . glimepiride  1 mg Oral BID WC  . insulin aspart  0-15 Units Subcutaneous TID WC  . insulin aspart  0-5 Units Subcutaneous QHS  . latanoprost  1 drop Both Eyes QHS  . lisinopril  20 mg Oral Daily  . polyethylene glycol  17 g Oral Daily  . senna-docusate  1 tablet Oral BID  . sodium chloride  500 mL Intravenous Once  . sodium chloride  3 mL Intravenous Q12H  . tamsulosin  0.4 mg Oral QHS   Continuous Infusions: . sodium chloride 10 mL/hr at 11/19/14 0848     Charlynne Cousins  [facility] Pager [phone]. If 8PM-8AM, please contact night-coverage at [email or web address], password [email or web address] 11/20/2014, 10:17 AM  LOS: 3 days

## 2014-11-20 NOTE — Progress Notes (Signed)
ANTIBIOTIC CONSULT NOTE - INITIAL  Pharmacy Consult for Primaxin Indication: ESBL UTI  No Known Allergies  Patient Measurements: Height: 5\' 6"  (167.6 cm) Weight: 180 lb 9.6 oz (81.92 kg) IBW/kg (Calculated) : 63.8   Vital Signs: Temp: 100.1 F (37.8 C) (02/02 2328) Temp Source: Oral (02/02 2328) BP: 160/73 mmHg (02/02 2100) Pulse Rate: 83 (02/02 2100) Intake/Output from previous day: 02/02 0701 - 02/03 0700 In: 720 [P.O.:720] Out: 175 [Urine:175] Intake/Output from this shift: Total I/O In: 240 [P.O.:240] Out: -   Labs:  Recent Labs  11/17/14 1110 11/18/14 0450 11/19/14 0425  WBC 27.6* 25.9* 16.4*  HGB 12.1* 11.4* 10.7*  PLT 226 208 223  CREATININE 1.25 1.28 1.06   Estimated Creatinine Clearance: 62.3 mL/min (by C-G formula based on Cr of 1.06). No results for input(s): VANCOTROUGH, VANCOPEAK, VANCORANDOM, GENTTROUGH, GENTPEAK, GENTRANDOM, TOBRATROUGH, TOBRAPEAK, TOBRARND, AMIKACINPEAK, AMIKACINTROU, AMIKACIN in the last 72 hours.   Microbiology: Recent Results (from the past 720 hour(s))  Urine culture     Status: None   Collection Time: 11/17/14 12:43 PM  Result Value Ref Range Status   Specimen Description URINE, RANDOM  Final   Special Requests Normal  Final   Colony Count   Final    70,000 COLONIES/ML Performed at [facility]    Culture   Final    ESCHERICHIA COLI Note: Confirmed Extended Spectrum Beta-Lactamase Producer (ESBL) CRITICAL RESULT CALLED TO, READ BACK BY AND VERIFIED WITH: NIALTA MAYFIELD AT 3:58 A.M. ON 11/20/2014 WARRB Performed at [facility]    Report Status 11/20/2014 FINAL  Final   Organism ID, Bacteria ESCHERICHIA COLI  Final      Susceptibility   Escherichia coli - MIC*    AMPICILLIN >=32 RESISTANT Resistant     CEFAZOLIN >=64 RESISTANT Resistant     CEFTRIAXONE >=64 RESISTANT Resistant     CIPROFLOXACIN >=4 RESISTANT Resistant     GENTAMICIN >=16 RESISTANT Resistant     LEVOFLOXACIN >=8 RESISTANT Resistant      NITROFURANTOIN <=16 SENSITIVE Sensitive     TOBRAMYCIN >=16 RESISTANT Resistant     TRIMETH/SULFA <=20 SENSITIVE Sensitive     IMIPENEM <=0.25 SENSITIVE Sensitive     PIP/TAZO 8 SENSITIVE Sensitive     * ESCHERICHIA COLI    Medical History: Past Medical History  Diagnosis Date  . Hypertension   . Hypercholesterolemia   . Diabetes mellitus   . Kidney stones     Medications:  Scheduled:  . amLODipine  5 mg Oral Daily  . atorvastatin  20 mg Oral Daily  . ciprofloxacin  400 mg Intravenous Q12H  . enoxaparin (LOVENOX) injection  40 mg Subcutaneous Q24H  . imipenem-cilastatin  500 mg Intravenous 3 times per day  . insulin aspart  0-15 Units Subcutaneous TID WC  . insulin aspart  0-5 Units Subcutaneous QHS  . latanoprost  1 drop Both Eyes QHS  . lisinopril  20 mg Oral Daily  . polyethylene glycol  17 g Oral Daily  . senna-docusate  1 tablet Oral BID  . sodium chloride  500 mL Intravenous Once  . sodium chloride  3 mL Intravenous Q12H   Infusions:  . sodium chloride 10 mL/hr at 11/19/14 0848   Assessment: 49 yoM admitted 1/31 with fever, uncontrolled CBGs and inability to urinate.  Now UC + ESBL E coli.  Primaxin per RX.   Goal of Therapy:  Treat infection  Plan:   Primaxin 500mg  IV q8h   F/u SCr/cultures as needed  Dorrene German 11/20/2014,4:46 AM

## 2014-11-20 NOTE — Progress Notes (Signed)
Spoke with Nadine Counts RN to assess if patient was ready for PICC placement this evening. She told me that MD wanted to wait until AM. Will reassess in AM for readiness for placement.

## 2014-11-20 NOTE — Progress Notes (Signed)
Informed Aileen Fass MD that patient's temperature was 101.1 and an hour after receiving tylenol it was 99.7

## 2014-11-20 NOTE — Progress Notes (Signed)
CRITICAL VALUE ALERT  Critical value received:  Urine culture-70,ooo colonies E. Coli / ESBL  Date of notification:  11/20/14  Time of notification:  0400  Critical value read back: yes  Nurse who received alert:  Wilhemina Cash  MD notified (1st page):  On-Call-[place]  Time of first page:  0405  MD notified (2nd page):  Time of second page:  Responding MD:  Baltazar Najjar  Time MD responded:  [phone], new orders given

## 2014-11-21 LAB — GLUCOSE, CAPILLARY
GLUCOSE-CAPILLARY: 135 mg/dL — AB (ref 70–99)
GLUCOSE-CAPILLARY: 81 mg/dL (ref 70–99)
Glucose-Capillary: 164 mg/dL — ABNORMAL HIGH (ref 70–99)
Glucose-Capillary: 149 mg/dL — ABNORMAL HIGH (ref 70–99)

## 2014-11-21 NOTE — Progress Notes (Signed)
[facility] PROGRESS NOTE  Assessment/Plan: Sepsis due to  UTI (lower urinary tract infection) - UA showed ESBL Escherichia coli, sensitive to Primaxin. - Has remained afebrile, blood cultures negative till date. - Will need a PICC line for home.  DM (diabetes mellitus), type 2: - Excellent controlled currently just a sliding scale, will resume Amaryl at a lower dose.  Essential  HTN (hypertension): - At goal.  Code Status: Full code Family Communication: Discussed with wife at bedside Disposition Plan: awaiting PICC line insert  Consultants:  none  Procedures:  UC ESBL E. coli  Antibiotics:  Primaxin switched to Invanz  HPI/Subjective: No complains.  Objective: Filed Vitals:   11/20/14 1354 11/20/14 1506 11/20/14 2205 11/21/14 0644  BP: 144/71  137/73 140/65  Pulse: 82  74 73  Temp: 101.1 F (38.4 C) 99.7 F (37.6 C) 100.3 F (37.9 C) 99.4 F (37.4 C)  TempSrc: Oral Oral Oral Oral  Resp: 20  20 20   Height:      Weight:      SpO2: 99%  100% 10%    Intake/Output Summary (Last 24 hours) at 11/21/14 0841 Last data filed at 11/20/14 1900  Gross per 24 hour  Intake    480 ml  Output      0 ml  Net    480 ml   Filed Weights   11/17/14 1028 11/17/14 1350  Weight: 81.647 kg (180 lb) 81.92 kg (180 lb 9.6 oz)    Exam:  General: Alert, awake, oriented x3, in no acute distress.  HEENT: No bruits, no goiter.  Heart: Regular rate and rhythm. Lungs: Good air movement, clear Abdomen: Soft, nontender, nondistended, positive bowel sounds.    Data Reviewed: Basic Metabolic Panel:  Recent Labs Lab 11/17/14 1110 11/18/14 0450 11/19/14 0425  NA 138 140 140  K 3.7 3.9 3.6  CL 106 108 109  CO2 26 24 25   GLUCOSE 195* 191* 168*  BUN 18 18 14   CREATININE 1.25 1.28 1.06  CALCIUM 8.5 8.3* 7.9*   Liver Function Tests:  Recent Labs Lab 11/17/14 1110  AST 29  ALT 30  ALKPHOS 79  BILITOT 0.8  PROT 6.8  ALBUMIN 3.4*   No results for  input(s): LIPASE, AMYLASE in the last 168 hours. No results for input(s): AMMONIA in the last 168 hours. CBC:  Recent Labs Lab 11/17/14 1110 11/18/14 0450 11/19/14 0425  WBC 27.6* 25.9* 16.4*  NEUTROABS 25.1*  --   --   HGB 12.1* 11.4* 10.7*  HCT 37.6* 35.6* 33.0*  MCV 82.6 82.2 81.1  PLT 226 208 223   Cardiac Enzymes: No results for input(s): CKTOTAL, CKMB, CKMBINDEX, TROPONINI in the last 168 hours. BNP (last 3 results) No results for input(s): BNP in the last 8760 hours.  ProBNP (last 3 results) No results for input(s): PROBNP in the last 8760 hours.  CBG:  Recent Labs Lab 11/20/14 0740 11/20/14 1220 11/20/14 1749 11/20/14 2201 11/21/14 0734  GLUCAP 181* 117* 117* 105* 164*    Recent Results (from the past 240 hour(s))  Urine culture     Status: None   Collection Time: 11/17/14 12:43 PM  Result Value Ref Range Status   Specimen Description URINE, RANDOM  Final   Special Requests Normal  Final   Colony Count   Final    70,000 COLONIES/ML Performed at [facility]    Culture   Final    ESCHERICHIA COLI Note: Confirmed Extended Spectrum Beta-Lactamase Producer (ESBL) CRITICAL RESULT  CALLED TO, READ BACK BY AND VERIFIED WITH: NIALTA MAYFIELD AT 3:58 A.M. ON 11/20/2014 WARRB Performed at [facility]    Report Status 11/20/2014 FINAL  Final   Organism ID, Bacteria ESCHERICHIA COLI  Final      Susceptibility   Escherichia coli - MIC*    AMPICILLIN >=32 RESISTANT Resistant     CEFAZOLIN >=64 RESISTANT Resistant     CEFTRIAXONE >=64 RESISTANT Resistant     CIPROFLOXACIN >=4 RESISTANT Resistant     GENTAMICIN >=16 RESISTANT Resistant     LEVOFLOXACIN >=8 RESISTANT Resistant     NITROFURANTOIN <=16 SENSITIVE Sensitive     TOBRAMYCIN >=16 RESISTANT Resistant     TRIMETH/SULFA <=20 SENSITIVE Sensitive     IMIPENEM <=0.25 SENSITIVE Sensitive     PIP/TAZO 8 SENSITIVE Sensitive     * ESCHERICHIA COLI  Culture, blood (routine x 2)     Status: None  (Preliminary result)   Collection Time: 11/20/14  4:45 PM  Result Value Ref Range Status   Specimen Description BLOOD LEFT HAND  Final   Special Requests BOTTLES DRAWN AEROBIC ONLY 5CC  Final   Culture   Final           BLOOD CULTURE RECEIVED NO GROWTH TO DATE CULTURE WILL BE HELD FOR 5 DAYS BEFORE ISSUING A FINAL NEGATIVE REPORT Performed at [facility]    Report Status PENDING  Incomplete  Culture, blood (routine x 2)     Status: None (Preliminary result)   Collection Time: 11/20/14  4:50 PM  Result Value Ref Range Status   Specimen Description BLOOD LEFT HAND  Final   Special Requests BOTTLES DRAWN AEROBIC ONLY 5CC  Final   Culture   Final           BLOOD CULTURE RECEIVED NO GROWTH TO DATE CULTURE WILL BE HELD FOR 5 DAYS BEFORE ISSUING A FINAL NEGATIVE REPORT Performed at [facility]    Report Status PENDING  Incomplete     Studies: No results found.  Scheduled Meds: . amLODipine  5 mg Oral Daily  . atorvastatin  20 mg Oral Daily  . enoxaparin (LOVENOX) injection  40 mg Subcutaneous Q24H  . ertapenem  1 g Intravenous Q24H  . glimepiride  1 mg Oral BID WC  . insulin aspart  0-15 Units Subcutaneous TID WC  . insulin aspart  0-5 Units Subcutaneous QHS  . latanoprost  1 drop Both Eyes QHS  . lisinopril  20 mg Oral Daily  . polyethylene glycol  17 g Oral Daily  . senna-docusate  1 tablet Oral BID  . sodium chloride  500 mL Intravenous Once  . sodium chloride  3 mL Intravenous Q12H  . tamsulosin  0.4 mg Oral QHS   Continuous Infusions: . sodium chloride 10 mL/hr at 11/19/14 0848     Charlynne Cousins  [facility] Pager [phone]. If 8PM-8AM, please contact night-coverage at [email or web address], password [facility] 11/21/2014, 8:41 AM  LOS: 4 days

## 2014-11-21 NOTE — Care Management Note (Signed)
CARE MANAGEMENT NOTE 11/21/2014  Patient:  NIKITAS, DAVTYAN   Account Number:  [id number]  Date Initiated:  11/18/2014  Documentation initiated by:  [name]  Subjective/Objective Assessment:   74 yo male admitted with fevers, UTI, sepsis. PTA lived at home with girlfriend.     Action/Plan:   Home when stable   Anticipated DC Date:  11/21/2014   Anticipated DC Plan:  [place]  CM consult      Choice offered to / List presented to:  C-1 Patient           Status of service:  Completed, signed off Medicare Important Message given?  YES (If response is "NO", the following Medicare IM given date fields will be blank) Date Medicare IM given:  11/21/2014 Medicare IM given by:  Marney Doctor Date Additional Medicare IM given:   Additional Medicare IM given by:    Discharge Disposition:  HOME/SELF CARE  Per UR Regulation:  Reviewed for med. necessity/level of care/duration of stay  If discussed at [place] of Stay Meetings, dates discussed:    Comments:  11/21/14 Marney Doctor RN,BSN,NCM [phone] Was informed that plan is for pt to be dc'd home on IV abx. Potentially on 11/22/14 after PICC placement.  Pt given [place] list for choice.  CM will check back for choice and assist with [facility] set up as needed.

## 2014-11-22 LAB — GLUCOSE, CAPILLARY
GLUCOSE-CAPILLARY: 126 mg/dL — AB (ref 70–99)
Glucose-Capillary: 191 mg/dL — ABNORMAL HIGH (ref 70–99)

## 2014-11-22 MED ORDER — SODIUM CHLORIDE 0.9 % IJ SOLN: 10.0000 mL | Freq: Two times a day (BID) | INTRAMUSCULAR | Status: DC

## 2014-11-22 MED ORDER — SODIUM CHLORIDE 0.9 % IV SOLN: 1.0000 g | INTRAVENOUS | Status: DC

## 2014-11-22 MED ORDER — SODIUM CHLORIDE 0.9 % IJ SOLN: 10.0000 mL | INTRAMUSCULAR | Status: DC | PRN

## 2014-11-22 NOTE — Care Management Note (Signed)
CARE MANAGEMENT NOTE 11/22/2014  Patient:  Tony Price, Tony Price   Account Number:  [id number]  Date Initiated:  11/18/2014  Documentation initiated by:  [name]  Subjective/Objective Assessment:   74 yo male admitted with fevers, UTI, sepsis. PTA lived at home with girlfriend.     Action/Plan:   Home when stable   Anticipated DC Date:  11/21/2014   Anticipated DC Plan:  [place]  CM consult      Choice offered to / List presented to:  C-1 Patient        [place] arranged  HH-1 RN      [place].   Status of service:  Completed, signed off Medicare Important Message given?  YES (If response is "NO", the following Medicare IM given date fields will be blank) Date Medicare IM given:  11/21/2014 Medicare IM given by:  Marney Doctor Date Additional Medicare IM given:   Additional Medicare IM given by:    Discharge Disposition:  HOME/SELF CARE  Per UR Regulation:  Reviewed for med. necessity/level of care/duration of stay  If discussed at [place] of Stay Meetings, dates discussed:    Comments:  11/22/14 Marney Doctor RN,BSN,NCM Pt to DC today with [facility] for IV abx at home.  Pt chose to use [facility] for [facility] services.  [facility] rep called to give referral. Prescription written for IV abx and HH orders placed.  Pt to receive PICC line today and will receive today's dose of IV Invanz prior to [place] home.  No additional CM needs noted at this time.  11/21/14 Marney Doctor RN,BSN,NCM [phone] Was informed that plan is for pt to be dc'd home on IV abx. Potentially on 11/22/14 after PICC placement.  Pt given [place] list for choice.  CM will check back for choice and assist with [facility] set up as needed.

## 2014-11-22 NOTE — Progress Notes (Signed)
Patient due for PICC line.  IV team RN assessed patient and he only needs 4 more days of antibiotics and then PICC would be pulled.  She recommended changing to Midline insertion.   Dr. Olevia Bowens notified and order received.  Zandra Abts [facility]  11/22/2014  11:38 AM

## 2014-11-22 NOTE — Progress Notes (Signed)
Pt discharged home with significant other in stable condition.  Discharge instructions given. Pt verbalized understanding

## 2014-11-22 NOTE — Discharge Summary (Signed)
Physician Discharge Summary  Tony Price ZOX:[id number] DOB: 16-Apr-1941 DOA: 11/17/2014  PCP: Andria Frames, MD  Admit date: 11/17/2014 Discharge date: 11/22/2014  Time spent: 30 minutes  Recommendations for Outpatient Follow-up:  1. Follow up with Urology in 2 weeks.  Discharge Diagnoses:  Principal Problem:   Sepsis Active Problems:   UTI (lower urinary tract infection)   DM (diabetes mellitus), type 2   HTN (hypertension)   Discharge Condition: stabl  Diet recommendation: Heart healthy  Filed Weights   11/17/14 1028 11/17/14 1350  Weight: 81.647 kg (180 lb) 81.92 kg (180 lb 9.6 oz)    History of present illness:  74 y.o. male with past medical history of diabetes mellitus and hypertension who underwent a prostate biopsy on 1/28. 2 days ago he started developing fever, uncontrolled sugar and was unable to urinate or having a bowel movement. His girlfriend asked him to come to the hospital yesterday but he did not come. He decided to come today because symptoms did not resolve. Temp was 101. No significant dysuria. He is urinating well now but still constipated.   Hospital Course:  Sepsis due to UTI (lower urinary tract infection): - On admission started on IV Cipro, with no improvement his UA grew Escherichia coli ESBL. He was transitioned to Primaxin. And he defervesced. - Once he defervesces PICC line was inserted and antibiotics changed to [facility] which she will continue for 5 additional days. - Blood cultures have remained negative till date. - DC PICC line on the sixth day by home health.  DM (diabetes mellitus), type 2: - Moderate controlled currently just a sliding scale. - No changes related to his regimen.  Essential HTN (hypertension): - Resume medications. Almost at goal.  Procedures:  none  Consultations:  none  Discharge Exam: Filed Vitals:   11/22/14 0728  BP: 131/68  Pulse: 68  Temp: 98.7 F (37.1 C)  Resp: 16    General: A&O  x3 Cardiovascular: RRR Respiratory: good air movement CTA B/L  Discharge Instructions   Discharge Instructions    Diet - low sodium heart healthy    Complete by:  As directed      Increase activity slowly    Complete by:  As directed           Current Discharge Medication List    START taking these medications   Details  ertapenem 1 g in sodium chloride 0.9 % 50 mL Inject 1 g into the vein daily. Qty: 5 Can, Refills: 0      CONTINUE these medications which have NOT CHANGED   Details  acetaminophen (TYLENOL) 500 MG tablet Take 1,000 mg by mouth every 6 (six) hours as needed for mild pain or headache.    amLODipine (NORVASC) 5 MG tablet Take 5 mg by mouth daily.      Ascorbic Acid (VITAMIN C) 1000 MG tablet Take 1,000 mg by mouth daily.    atorvastatin (LIPITOR) 20 MG tablet Take 20 mg by mouth daily.      glimepiride (AMARYL) 2 MG tablet Take 2 mg by mouth 2 (two) times daily.      latanoprost (XALATAN) 0.005 % ophthalmic solution Place 1 drop into both eyes at bedtime.    quinapril (ACCUPRIL) 20 MG tablet Take 20 mg by mouth daily.    ondansetron (ZOFRAN) 8 MG tablet Take 1 tablet (8 mg total) by mouth every 8 (eight) hours as needed for nausea or vomiting. Qty: 20 tablet, Refills: 0    oxyCODONE-acetaminophen (PERCOCET/ROXICET)  5-325 MG per tablet Take 1 tablet by mouth every 4 (four) hours as needed for severe pain. Qty: 20 tablet, Refills: 0    tamsulosin (FLOMAX) 0.4 MG CAPS capsule Take 1 capsule (0.4 mg total) by mouth at bedtime. Qty: 5 capsule, Refills: 0       No Known Allergies    The results of significant diagnostics from this hospitalization (including imaging, microbiology, ancillary and laboratory) are listed below for reference.    Significant Diagnostic Studies: No results found.  Microbiology: Recent Results (from the past 240 hour(s))  Urine culture     Status: None   Collection Time: 11/17/14 12:43 PM  Result Value Ref Range Status    Specimen Description URINE, RANDOM  Final   Special Requests Normal  Final   Colony Count   Final    70,000 COLONIES/ML Performed at [facility]    Culture   Final    ESCHERICHIA COLI Note: Confirmed Extended Spectrum Beta-Lactamase Producer (ESBL) CRITICAL RESULT CALLED TO, READ BACK BY AND VERIFIED WITH: NIALTA MAYFIELD AT 3:58 A.M. ON 11/20/2014 WARRB Performed at [facility]    Report Status 11/20/2014 FINAL  Final   Organism ID, Bacteria ESCHERICHIA COLI  Final      Susceptibility   Escherichia coli - MIC*    AMPICILLIN >=32 RESISTANT Resistant     CEFAZOLIN >=64 RESISTANT Resistant     CEFTRIAXONE >=64 RESISTANT Resistant     CIPROFLOXACIN >=4 RESISTANT Resistant     GENTAMICIN >=16 RESISTANT Resistant     LEVOFLOXACIN >=8 RESISTANT Resistant     NITROFURANTOIN <=16 SENSITIVE Sensitive     TOBRAMYCIN >=16 RESISTANT Resistant     TRIMETH/SULFA <=20 SENSITIVE Sensitive     IMIPENEM <=0.25 SENSITIVE Sensitive     PIP/TAZO 8 SENSITIVE Sensitive     * ESCHERICHIA COLI  Culture, blood (routine x 2)     Status: None (Preliminary result)   Collection Time: 11/20/14  4:45 PM  Result Value Ref Range Status   Specimen Description BLOOD LEFT HAND  Final   Special Requests BOTTLES DRAWN AEROBIC ONLY 5CC  Final   Culture   Final           BLOOD CULTURE RECEIVED NO GROWTH TO DATE CULTURE WILL BE HELD FOR 5 DAYS BEFORE ISSUING A FINAL NEGATIVE REPORT Performed at [facility]    Report Status PENDING  Incomplete  Culture, blood (routine x 2)     Status: None (Preliminary result)   Collection Time: 11/20/14  4:50 PM  Result Value Ref Range Status   Specimen Description BLOOD LEFT HAND  Final   Special Requests BOTTLES DRAWN AEROBIC ONLY 5CC  Final   Culture   Final           BLOOD CULTURE RECEIVED NO GROWTH TO DATE CULTURE WILL BE HELD FOR 5 DAYS BEFORE ISSUING A FINAL NEGATIVE REPORT Performed at [facility]    Report Status PENDING  Incomplete      Labs: Basic Metabolic Panel:  Recent Labs Lab 11/17/14 1110 11/18/14 0450 11/19/14 0425  NA 138 140 140  K 3.7 3.9 3.6  CL 106 108 109  CO2 26 24 25   GLUCOSE 195* 191* 168*  BUN 18 18 14   CREATININE 1.25 1.28 1.06  CALCIUM 8.5 8.3* 7.9*   Liver Function Tests:  Recent Labs Lab 11/17/14 1110  AST 29  ALT 30  ALKPHOS 79  BILITOT 0.8  PROT 6.8  ALBUMIN 3.4*  No results for input(s): LIPASE, AMYLASE in the last 168 hours. No results for input(s): AMMONIA in the last 168 hours. CBC:  Recent Labs Lab 11/17/14 1110 11/18/14 0450 11/19/14 0425  WBC 27.6* 25.9* 16.4*  NEUTROABS 25.1*  --   --   HGB 12.1* 11.4* 10.7*  HCT 37.6* 35.6* 33.0*  MCV 82.6 82.2 81.1  PLT 226 208 223   Cardiac Enzymes: No results for input(s): CKTOTAL, CKMB, CKMBINDEX, TROPONINI in the last 168 hours. BNP: BNP (last 3 results) No results for input(s): BNP in the last 8760 hours.  ProBNP (last 3 results) No results for input(s): PROBNP in the last 8760 hours.  CBG:  Recent Labs Lab 11/21/14 0734 11/21/14 1157 11/21/14 1729 11/21/14 2134 11/22/14 0758  GLUCAP 164* 135* 81 149* 126*       Signed:  Charlynne Cousins  [facility] 11/22/2014, 8:38 AM

## 2014-11-22 NOTE — Progress Notes (Signed)
Peripherally Inserted Central Catheter/Midline Placement  The IV Nurse has discussed with the patient and/or persons authorized to consent for the patient, the purpose of this procedure and the potential benefits and risks involved with this procedure.  The benefits include less needle sticks, lab draws from the catheter and patient may be discharged home with the catheter.  Risks include, but not limited to, infection, bleeding, blood clot (thrombus formation), and puncture of an artery; nerve damage and irregular heat beat.  Alternatives to this procedure were also discussed.  PICC/Midline Placement Documentation        Tony Price 11/22/2014, 12:26 PM

## 2014-11-22 NOTE — Progress Notes (Signed)
[facility]  Patient Status: New pt for [facility] this admission  [facility] is providing the following services:  HHRN and Home Infusion Pharmacy for home IV ABX.  [facility] hospital Infusion Coordinator will support in hospital teaching regarding home IV ABX to support independence at home.  [facility] team will support transition home.   If patient discharges after hours, please call [phone].   Larry Sierras 11/22/2014, 10:08 AM

## 2014-11-23 DIAGNOSIS — A4151 Sepsis due to Escherichia coli [E. coli]: Secondary | ICD-10-CM | POA: Diagnosis not present

## 2014-11-23 DIAGNOSIS — I10 Essential (primary) hypertension: Secondary | ICD-10-CM | POA: Diagnosis not present

## 2014-11-23 DIAGNOSIS — N390 Urinary tract infection, site not specified: Secondary | ICD-10-CM | POA: Diagnosis not present

## 2014-11-23 DIAGNOSIS — E119 Type 2 diabetes mellitus without complications: Secondary | ICD-10-CM | POA: Diagnosis not present

## 2014-11-23 DIAGNOSIS — Z452 Encounter for adjustment and management of vascular access device: Secondary | ICD-10-CM | POA: Diagnosis not present

## 2014-11-25 DIAGNOSIS — E119 Type 2 diabetes mellitus without complications: Secondary | ICD-10-CM | POA: Diagnosis not present

## 2014-11-25 DIAGNOSIS — N390 Urinary tract infection, site not specified: Secondary | ICD-10-CM | POA: Diagnosis not present

## 2014-11-25 DIAGNOSIS — Z452 Encounter for adjustment and management of vascular access device: Secondary | ICD-10-CM | POA: Diagnosis not present

## 2014-11-25 DIAGNOSIS — A4151 Sepsis due to Escherichia coli [E. coli]: Secondary | ICD-10-CM | POA: Diagnosis not present

## 2014-11-25 DIAGNOSIS — I10 Essential (primary) hypertension: Secondary | ICD-10-CM | POA: Diagnosis not present

## 2014-11-26 DIAGNOSIS — N390 Urinary tract infection, site not specified: Secondary | ICD-10-CM | POA: Diagnosis not present

## 2014-11-26 DIAGNOSIS — A4151 Sepsis due to Escherichia coli [E. coli]: Secondary | ICD-10-CM | POA: Diagnosis not present

## 2014-11-26 DIAGNOSIS — E119 Type 2 diabetes mellitus without complications: Secondary | ICD-10-CM | POA: Diagnosis not present

## 2014-11-26 DIAGNOSIS — Z452 Encounter for adjustment and management of vascular access device: Secondary | ICD-10-CM | POA: Diagnosis not present

## 2014-11-26 DIAGNOSIS — I10 Essential (primary) hypertension: Secondary | ICD-10-CM | POA: Diagnosis not present

## 2014-11-26 LAB — CULTURE, BLOOD (ROUTINE X 2)
Culture: NO GROWTH
Culture: NO GROWTH

## 2014-11-28 DIAGNOSIS — H402211 Chronic angle-closure glaucoma, right eye, mild stage: Secondary | ICD-10-CM | POA: Diagnosis not present

## 2014-11-28 DIAGNOSIS — H402222 Chronic angle-closure glaucoma, left eye, moderate stage: Secondary | ICD-10-CM | POA: Diagnosis not present

## 2014-12-12 DIAGNOSIS — B351 Tinea unguium: Secondary | ICD-10-CM | POA: Diagnosis not present

## 2014-12-12 DIAGNOSIS — M792 Neuralgia and neuritis, unspecified: Secondary | ICD-10-CM | POA: Diagnosis not present

## 2014-12-12 DIAGNOSIS — M79609 Pain in unspecified limb: Secondary | ICD-10-CM | POA: Diagnosis not present

## 2015-01-04 DIAGNOSIS — I10 Essential (primary) hypertension: Secondary | ICD-10-CM | POA: Diagnosis not present

## 2015-01-04 DIAGNOSIS — E119 Type 2 diabetes mellitus without complications: Secondary | ICD-10-CM | POA: Diagnosis not present

## 2015-01-04 DIAGNOSIS — R319 Hematuria, unspecified: Secondary | ICD-10-CM | POA: Diagnosis not present

## 2015-01-04 DIAGNOSIS — E782 Mixed hyperlipidemia: Secondary | ICD-10-CM | POA: Diagnosis not present

## 2015-02-15 DIAGNOSIS — I10 Essential (primary) hypertension: Secondary | ICD-10-CM | POA: Diagnosis not present

## 2015-02-15 DIAGNOSIS — E119 Type 2 diabetes mellitus without complications: Secondary | ICD-10-CM | POA: Diagnosis not present

## 2015-02-15 DIAGNOSIS — R05 Cough: Secondary | ICD-10-CM | POA: Diagnosis not present

## 2015-02-15 DIAGNOSIS — E782 Mixed hyperlipidemia: Secondary | ICD-10-CM | POA: Diagnosis not present

## 2015-03-17 ENCOUNTER — Ambulatory Visit (INDEPENDENT_AMBULATORY_CARE_PROVIDER_SITE_OTHER): Payer: Medicare Other | Admitting: Family Medicine

## 2015-03-17 VITALS — BP 138/82 | HR 89 | Temp 99.2°F | Resp 18 | Ht 65.25 in | Wt 188.8 lb

## 2015-03-17 DIAGNOSIS — K1370 Unspecified lesions of oral mucosa: Secondary | ICD-10-CM | POA: Diagnosis not present

## 2015-03-17 DIAGNOSIS — M7989 Other specified soft tissue disorders: Secondary | ICD-10-CM

## 2015-03-17 DIAGNOSIS — T7840XA Allergy, unspecified, initial encounter: Secondary | ICD-10-CM

## 2015-03-17 DIAGNOSIS — R449 Unspecified symptoms and signs involving general sensations and perceptions: Secondary | ICD-10-CM

## 2015-03-17 MED ORDER — PREDNISONE 20 MG PO TABS: ORAL_TABLET | ORAL | Status: DC

## 2015-03-17 NOTE — Progress Notes (Signed)
  Subjective:  Patient ID: Tony Price, male    DOB: December 24, 1940  Age: 74 y.o. MRN: [id number]  74 year old man with history of diabetes and hypertension. Generally does well. Yesterday he started having a swelling of both of his hands and a sandpaper sensation of his lips both upper and lower. He did take some Zyrtec last night. He again having symptoms today. Knows of no allergies and specifically. He did eat cookies with macadamia nuts in them yesterday he also spread some multiple falls which he had crunched up.  Objective:   No major distress. Lips are not visibly swollen. Hands are puffy. Grip good. His chest is clear. Heart regular without murmurs.  Assessment & Plan:   Assessment: This seems to be some kind of allergic reaction by the symptoms. Will treat accordingly. Patient is also diabetic so need to be a little cautious about using ster0ids, which we will avoid if anti-histamines do the job.  Plan: Patient Instructions  Take Zyrtec (cetirizine) 10 mg daily  Take Zantac (ranitidine) 150 mg twice daily  Continue these for 3-5 days depending on symptoms  If you are still swelling in the hands and lips sensation by tomorrow go ahead and take the prednisone. If you need to take it, monitor your sugar closely and watch which it.  If at any time you feel like you are having a worsening allergic reaction either return or go to the emergency room or call [place], MD 03/17/2015

## 2015-03-17 NOTE — Patient Instructions (Signed)
Take Zyrtec (cetirizine) 10 mg daily  Take Zantac (ranitidine) 150 mg twice daily  Continue these for 3-5 days depending on symptoms  If you are still swelling in the hands and lips sensation by tomorrow go ahead and take the prednisone. If you need to take it, monitor your sugar closely and watch which it.  If at any time you feel like you are having a worsening allergic reaction either return or go to the emergency room or call 911

## 2015-03-29 DIAGNOSIS — E782 Mixed hyperlipidemia: Secondary | ICD-10-CM | POA: Diagnosis not present

## 2015-03-29 DIAGNOSIS — I10 Essential (primary) hypertension: Secondary | ICD-10-CM | POA: Diagnosis not present

## 2015-03-29 DIAGNOSIS — E119 Type 2 diabetes mellitus without complications: Secondary | ICD-10-CM | POA: Diagnosis not present

## 2015-03-29 DIAGNOSIS — R05 Cough: Secondary | ICD-10-CM | POA: Diagnosis not present

## 2015-04-26 DIAGNOSIS — E782 Mixed hyperlipidemia: Secondary | ICD-10-CM | POA: Diagnosis not present

## 2015-04-26 DIAGNOSIS — I10 Essential (primary) hypertension: Secondary | ICD-10-CM | POA: Diagnosis not present

## 2015-04-26 DIAGNOSIS — E119 Type 2 diabetes mellitus without complications: Secondary | ICD-10-CM | POA: Diagnosis not present

## 2015-04-26 DIAGNOSIS — B351 Tinea unguium: Secondary | ICD-10-CM | POA: Diagnosis not present

## 2015-05-09 DIAGNOSIS — R972 Elevated prostate specific antigen [PSA]: Secondary | ICD-10-CM | POA: Diagnosis not present

## 2015-05-16 DIAGNOSIS — R972 Elevated prostate specific antigen [PSA]: Secondary | ICD-10-CM | POA: Diagnosis not present

## 2015-05-16 DIAGNOSIS — N390 Urinary tract infection, site not specified: Secondary | ICD-10-CM | POA: Diagnosis not present

## 2015-05-29 DIAGNOSIS — H402222 Chronic angle-closure glaucoma, left eye, moderate stage: Secondary | ICD-10-CM | POA: Diagnosis not present

## 2015-05-29 DIAGNOSIS — H402211 Chronic angle-closure glaucoma, right eye, mild stage: Secondary | ICD-10-CM | POA: Diagnosis not present

## 2015-05-29 DIAGNOSIS — H04213 Epiphora due to excess lacrimation, bilateral lacrimal glands: Secondary | ICD-10-CM | POA: Diagnosis not present

## 2015-05-29 DIAGNOSIS — H02403 Unspecified ptosis of bilateral eyelids: Secondary | ICD-10-CM | POA: Diagnosis not present

## 2015-06-05 DIAGNOSIS — B351 Tinea unguium: Secondary | ICD-10-CM | POA: Diagnosis not present

## 2015-07-08 ENCOUNTER — Ambulatory Visit (INDEPENDENT_AMBULATORY_CARE_PROVIDER_SITE_OTHER): Payer: Medicare Other | Admitting: Ophthalmology

## 2015-07-08 DIAGNOSIS — H43813 Vitreous degeneration, bilateral: Secondary | ICD-10-CM | POA: Diagnosis not present

## 2015-07-08 DIAGNOSIS — E11319 Type 2 diabetes mellitus with unspecified diabetic retinopathy without macular edema: Secondary | ICD-10-CM | POA: Diagnosis not present

## 2015-07-08 DIAGNOSIS — E11329 Type 2 diabetes mellitus with mild nonproliferative diabetic retinopathy without macular edema: Secondary | ICD-10-CM

## 2015-07-08 DIAGNOSIS — H35033 Hypertensive retinopathy, bilateral: Secondary | ICD-10-CM | POA: Diagnosis not present

## 2015-07-08 DIAGNOSIS — I10 Essential (primary) hypertension: Secondary | ICD-10-CM

## 2015-07-08 DIAGNOSIS — H35341 Macular cyst, hole, or pseudohole, right eye: Secondary | ICD-10-CM

## 2015-07-08 DIAGNOSIS — H2513 Age-related nuclear cataract, bilateral: Secondary | ICD-10-CM

## 2015-08-04 ENCOUNTER — Encounter: Payer: Self-pay | Admitting: Family Medicine

## 2015-08-04 ENCOUNTER — Ambulatory Visit (INDEPENDENT_AMBULATORY_CARE_PROVIDER_SITE_OTHER): Payer: Medicare Other | Admitting: Family Medicine

## 2015-08-04 VITALS — BP 146/71 | HR 67 | Temp 98.5°F | Resp 16 | Ht 65.5 in | Wt 178.0 lb

## 2015-08-04 DIAGNOSIS — E119 Type 2 diabetes mellitus without complications: Secondary | ICD-10-CM | POA: Diagnosis not present

## 2015-08-04 DIAGNOSIS — I10 Essential (primary) hypertension: Secondary | ICD-10-CM | POA: Diagnosis not present

## 2015-08-04 DIAGNOSIS — E785 Hyperlipidemia, unspecified: Secondary | ICD-10-CM

## 2015-08-04 LAB — COMPLETE METABOLIC PANEL WITH GFR
ALT: 8 U/L — ABNORMAL LOW (ref 9–46)
AST: 14 U/L (ref 10–35)
Albumin: 4.1 g/dL (ref 3.6–5.1)
Alkaline Phosphatase: 73 U/L (ref 40–115)
BUN: 15 mg/dL (ref 7–25)
CHLORIDE: 107 mmol/L (ref 98–110)
CO2: 26 mmol/L (ref 20–31)
Calcium: 9.6 mg/dL (ref 8.6–10.3)
Creat: 1.18 mg/dL (ref 0.70–1.18)
GFR, Est African American: 70 mL/min (ref >=60)
GFR, Est Non African American: 61 mL/min (ref >=60)
Glucose, Bld: 73 mg/dL (ref 65–99)
Potassium: 4.2 mmol/L (ref 3.5–5.3)
Sodium: 143 mmol/L (ref 135–146)
Total Bilirubin: 0.3 mg/dL (ref 0.2–1.2)
Total Protein: 7.5 g/dL (ref 6.1–8.1)

## 2015-08-04 LAB — POCT GLYCOSYLATED HEMOGLOBIN (HGB A1C): HEMOGLOBIN A1C: 6.5

## 2015-08-04 LAB — GLUCOSE, POCT (MANUAL RESULT ENTRY): POC Glucose: 83 mg/dl (ref 70–99)

## 2015-08-04 LAB — HEMOGLOBIN A1C: HEMOGLOBIN A1C: 6.5 % — AB (ref 4.0–6.0)

## 2015-08-04 MED ORDER — GLIMEPIRIDE 2 MG PO TABS: 2.0000 mg | ORAL_TABLET | Freq: Two times a day (BID) | ORAL | Status: DC

## 2015-08-04 MED ORDER — ATORVASTATIN CALCIUM 20 MG PO TABS: 20.0000 mg | ORAL_TABLET | Freq: Every day | ORAL | Status: DC

## 2015-08-04 MED ORDER — QUINAPRIL HCL 20 MG PO TABS: 20.0000 mg | ORAL_TABLET | Freq: Every day | ORAL | Status: DC

## 2015-08-04 MED ORDER — AMLODIPINE BESYLATE 5 MG PO TABS: 5.0000 mg | ORAL_TABLET | Freq: Every day | ORAL | Status: DC

## 2015-08-04 NOTE — Progress Notes (Signed)
Subjective:  This chart was scribed for Tony Ray, MD by [name], Medical Scribe. This patient was seen in room 23 and the patient's care was started 4:27 PM.   Patient ID: Tony Price, male    DOB: 07/21/1941, 74 y.o.   MRN: [id number]  HPI [name] is a 74 y.o. male New patient to me, former patient of [name]. He requests to establish care because in the past, he would have to make a new appointment with exam each time he needed refills.  H/o type 2 DM, HTN. He was admitted in hospital for sepsis secondary to UTI.  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   DM No recent a1c but glucose in hospital ranged from 81-191. He is on amaryl 83m qd. He measured his blood sugar at 109 this morning. He denies dizziness, lightheadedness.   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  On norvasc 532mqd, and accupril 2037md.   He states that his BP usually runs well around 120s/70s. He denies checking BP at home.   HLD On lipitor 44m66m.   Patient Active Problem List   Diagnosis Date Noted  . UTI (lower urinary tract infection) 11/17/2014  . DM (diabetes mellitus), type 2 (HCC)[place]/31/2016  . HTN (hypertension) 11/17/2014  . Sepsis (HCC)[place]/31/2016   Past Medical History  Diagnosis Date  . Hypertension   . Hypercholesterolemia   . Diabetes mellitus   . Kidney stones    Past Surgical History  Procedure Laterality Date  . Eye surgery    . Rotator cuff repair      Right  . Prostate biopsy     No Known Allergies Prior to Admission medications   Medication Sig Start Date End Date Taking? Authorizing Provider  acetaminophen (TYLENOL) 500 MG tablet Take 1,000 mg by mouth every 6 (six) hours as needed for mild pain or headache.    Historical Provider, MD  amLODipine (NORVASC) 5 MG tablet Take 5 mg by mouth daily.      Historical Provider, MD  Ascorbic Acid (VITAMIN C) 1000 MG  tablet Take 1,000 mg by mouth daily.    Historical Provider, MD  atorvastatin (LIPITOR) 20 MG tablet Take 20 mg by mouth daily.      Historical Provider, MD  ertapenem 1 g in sodium chloride 0.9 % 50 mL Inject 1 g into the vein daily. 11/22/14   [name]  glimepiride (AMARYL) 2 MG tablet Take 2 mg by mouth 2 (two) times daily.      Historical Provider, MD  latanoprost (XALATAN) 0.005 % ophthalmic solution Place 1 drop into both eyes at bedtime.    Historical Provider, MD  ondansetron (ZOFRAN) 8 MG tablet Take 1 tablet (8 mg total) by mouth every 8 (eight) hours as needed for nausea or vomiting. Patient not taking: Reported on 11/17/2014 02/08/14   [name]  oxyCODONE-acetaminophen (PERCOCET/ROXICET) 5-325 MG per tablet Take 1 tablet by mouth every 4 (four) hours as needed for severe pain. Patient not taking: Reported on 11/17/2014 02/08/14   [name]  predniSONE (DELTASONE) 20 MG tablet Take 3 daily for 2 days, then 2 daily for 2 days, then 1 daily for 2 days for allergic reaction 03/17/15   [name]  quinapril (ACCUPRIL) 20 MG tablet Take 20 mg by mouth daily.    Historical Provider, MD  tamsulosin (  FLOMAX) 0.4 MG CAPS capsule Take 1 capsule (0.4 mg total) by mouth at bedtime. Patient not taking: Reported on 11/17/2014 02/08/14   [name], PA-C   Social History   Social History  . Marital Status: Divorced    Spouse Name: [name]  . Number of Children: [name]  . Years of Education: [name]   Occupational History  . Not on file.   Social History Main Topics  . Smoking status: Former Smoker -- 2 years  . Smokeless tobacco: Never Used  . Alcohol Use: No  . Drug Use: No  . Sexual Activity: Not on file   Other Topics Concern  . Not on file   Social History Narrative    Review of Systems  Constitutional: Negative for fatigue and unexpected weight change.  Eyes: Negative for visual disturbance.  Respiratory: Negative for cough,  chest tightness and shortness of breath.   Cardiovascular: Negative for chest pain, palpitations and leg swelling.  Gastrointestinal: Negative for abdominal pain, constipation and blood in stool.  Neurological: Negative for dizziness, light-headedness and headaches.       Objective:   Physical Exam  Constitutional: He is oriented to person, place, and time. He appears well-developed and well-nourished.  HENT:  Head: Normocephalic and atraumatic.  Eyes: EOM are normal. Pupils are equal, round, and reactive to light.  Neck: No JVD present. Carotid bruit is not present.  Cardiovascular: Normal rate, regular rhythm and normal heart sounds.   No murmur heard. Pulmonary/Chest: Effort normal and breath sounds normal. No respiratory distress. He has no wheezes. He has no rales.  Musculoskeletal: He exhibits no edema.  Neurological: He is alert and oriented to person, place, and time.  Skin: Skin is warm and dry.  Psychiatric: He has a normal mood and affect.  Vitals reviewed.   Filed Vitals:   08/04/15 1602 08/04/15 1607  BP: 141/67 146/71  Pulse: 67   Temp: 98.5 F (36.9 C)   TempSrc: Oral   Resp: 16   Height: 5' 5.5" (1.664 m)   Weight: 178 lb (80.74 kg)        Assessment & Plan:   [name] is a 74 y.o. male Controlled type 2 diabetes mellitus without complication, without long-term current use of insulin ([place]) - Plan: POCT glucose (manual entry), POCT glycosylated hemoglobin (Hb A1C), glimepiride (AMARYL) 2 MG tablet, Microalbumin, urine  -controlled, and no hypoglycemia on current stable regimen.  No changes, but risks of sulfonyureas including hypoglycemia discussed.   Essential hypertension - Plan: COMPLETE METABOLIC PANEL WITH GFR, amLODipine (NORVASC) 5 MG tablet, quinapril (ACCUPRIL) 20 MG tablet  -borderline elevation. Check home readings and if remaining elevated - will need to possibly adjust regimen.   Hyperlipemia - Plan: COMPLETE METABOLIC PANEL WITH GFR,  atorvastatin (LIPITOR) 20 MG tablet  -continue Lipitor for now, labs pending.    Meds ordered this encounter  Medications  . amLODipine (NORVASC) 5 MG tablet    Sig: Take 1 tablet (5 mg total) by mouth daily.    Dispense:  90 tablet    Refill:  1  . atorvastatin (LIPITOR) 20 MG tablet    Sig: Take 1 tablet (20 mg total) by mouth daily.    Dispense:  90 tablet    Refill:  1  . glimepiride (AMARYL) 2 MG tablet    Sig: Take 1 tablet (2 mg total) by mouth 2 (two) times daily.    Dispense:  180 tablet    Refill:  1  . quinapril (  ACCUPRIL) 20 MG tablet    Sig: Take 1 tablet (20 mg total) by mouth daily.    Dispense:  90 tablet    Refill:  1   Patient Instructions  Keep a record of your blood pressures outside of the office and bring them to the next office visit. If remaining over 140/90 - return to discuss changes in medicines.   You should receive a call or letter about your lab results within the next week to 10 days.   Plan on physical in next 6 months.   Diabetes and Standards of Medical Care Diabetes is complicated. You may find that your diabetes team includes a dietitian, nurse, diabetes educator, eye doctor, and more. To help everyone know what is going on and to help you get the care you deserve, the following schedule of care was developed to help keep you on track. Below are the tests, exams, vaccines, medicines, education, and plans you will need. HbA1c test This test shows how well you have controlled your glucose over the past 2-3 months. It is used to see if your diabetes management plan needs to be adjusted.   It is performed at least 2 times a year if you are meeting treatment goals.  It is performed 4 times a year if therapy has changed or if you are not meeting treatment goals. Blood pressure test  This test is performed at every routine medical visit. The goal is less than 140/90 mm Hg for most people, but 130/80 mm Hg in some cases. Ask your health care  provider about your goal. Dental exam  Follow up with the dentist regularly. Eye exam  If you are diagnosed with type 1 diabetes as a child, get an exam upon reaching the age of 16 years or older and having had diabetes for 3-5 years. Yearly eye exams are recommended after that initial eye exam.  If you are diagnosed with type 1 diabetes as an adult, get an exam within 5 years of diagnosis and then yearly.  If you are diagnosed with type 2 diabetes, get an exam as soon as possible after the diagnosis and then yearly. Foot care exam  Visual foot exams are performed at every routine medical visit. The exams check for cuts, injuries, or other problems with the feet.  You should have a complete foot exam performed every year. This exam includes an inspection of the structure and skin of your feet, a check of the pulses in your feet, and a check of the sensation in your feet.  Type 1 diabetes: The first exam is performed 5 years after diagnosis.  Type 2 diabetes: The first exam is performed at the time of diagnosis.  Check your feet nightly for cuts, injuries, or other problems with your feet. Tell your health care provider if anything is not healing. Kidney function test (urine microalbumin)  This test is performed once a year.  Type 1 diabetes: The first test is performed 5 years after diagnosis.  Type 2 diabetes: The first test is performed at the time of diagnosis.  A serum creatinine and estimated glomerular filtration rate (eGFR) test is done once a year to assess the level of chronic kidney disease (CKD), if present. Lipid profile (cholesterol, HDL, LDL, triglycerides)  Performed every 5 years for most people.  The goal for LDL is less than 100 mg/dL. If you are at high risk, the goal is less than 70 mg/dL.  The goal for HDL is 40 mg/dL-50  mg/dL for men and 50 mg/dL-60 mg/dL for women. An HDL cholesterol of 60 mg/dL or higher gives some protection against heart disease.  The  goal for triglycerides is less than 150 mg/dL. Immunizations  The flu (influenza) vaccine is recommended yearly for every person 68 months of age or older who has diabetes.  The pneumonia (pneumococcal) vaccine is recommended for every person 45 years of age or older who has diabetes. Adults 51 years of age or older may receive the pneumonia vaccine as a series of two separate shots.  The hepatitis B vaccine is recommended for adults shortly after they have been diagnosed with diabetes.  The Tdap (tetanus, diphtheria, and pertussis) vaccine should be given:  According to normal childhood vaccination schedules, for children.  Every 10 years, for adults who have diabetes. Diabetes self-management education  Education is recommended at diagnosis and ongoing as needed. Treatment plan  Your treatment plan is reviewed at every medical visit.   This information is not intended to replace advice given to you by your health care provider. Make sure you discuss any questions you have with your health care provider.   Document Released: 08/01/2009 Document Revised: 10/25/2014 Document Reviewed: 03/06/2013 Elsevier Interactive Patient Education [employer].      By signing my name below, I, [name], attest that this documentation has been prepared under the direction and in the presence of Tony Ray, MD. Electronically Signed: [name], [place]. 08/04/2015 , 4:27 PM .  I personally performed the services described in this documentation, which was scribed in my presence. The recorded information has been reviewed and considered, and addended by me as needed.

## 2015-08-04 NOTE — Patient Instructions (Addendum)
Keep a record of your blood pressures outside of the office and bring them to the next office visit. If remaining over 140/90 - return to discuss changes in medicines.   You should receive a call or letter about your lab results within the next week to 10 days.   Plan on physical in next 6 months.   Diabetes and Standards of Medical Care Diabetes is complicated. You may find that your diabetes team includes a dietitian, nurse, diabetes educator, eye doctor, and more. To help everyone know what is going on and to help you get the care you deserve, the following schedule of care was developed to help keep you on track. Below are the tests, exams, vaccines, medicines, education, and plans you will need. HbA1c test This test shows how well you have controlled your glucose over the past 2-3 months. It is used to see if your diabetes management plan needs to be adjusted.   It is performed at least 2 times a year if you are meeting treatment goals.  It is performed 4 times a year if therapy has changed or if you are not meeting treatment goals. Blood pressure test  This test is performed at every routine medical visit. The goal is less than 140/90 mm Hg for most people, but 130/80 mm Hg in some cases. Ask your health care provider about your goal. Dental exam  Follow up with the dentist regularly. Eye exam  If you are diagnosed with type 1 diabetes as a child, get an exam upon reaching the age of 29 years or older and having had diabetes for 3-5 years. Yearly eye exams are recommended after that initial eye exam.  If you are diagnosed with type 1 diabetes as an adult, get an exam within 5 years of diagnosis and then yearly.  If you are diagnosed with type 2 diabetes, get an exam as soon as possible after the diagnosis and then yearly. Foot care exam  Visual foot exams are performed at every routine medical visit. The exams check for cuts, injuries, or other problems with the feet.  You should  have a complete foot exam performed every year. This exam includes an inspection of the structure and skin of your feet, a check of the pulses in your feet, and a check of the sensation in your feet.  Type 1 diabetes: The first exam is performed 5 years after diagnosis.  Type 2 diabetes: The first exam is performed at the time of diagnosis.  Check your feet nightly for cuts, injuries, or other problems with your feet. Tell your health care provider if anything is not healing. Kidney function test (urine microalbumin)  This test is performed once a year.  Type 1 diabetes: The first test is performed 5 years after diagnosis.  Type 2 diabetes: The first test is performed at the time of diagnosis.  A serum creatinine and estimated glomerular filtration rate (eGFR) test is done once a year to assess the level of chronic kidney disease (CKD), if present. Lipid profile (cholesterol, HDL, LDL, triglycerides)  Performed every 5 years for most people.  The goal for LDL is less than 100 mg/dL. If you are at high risk, the goal is less than 70 mg/dL.  The goal for HDL is 40 mg/dL-50 mg/dL for men and 50 mg/dL-60 mg/dL for women. An HDL cholesterol of 60 mg/dL or higher gives some protection against heart disease.  The goal for triglycerides is less than 150 mg/dL. Immunizations  The flu (influenza) vaccine is recommended yearly for every person 60 months of age or older who has diabetes.  The pneumonia (pneumococcal) vaccine is recommended for every person 26 years of age or older who has diabetes. Adults 27 years of age or older may receive the pneumonia vaccine as a series of two separate shots.  The hepatitis B vaccine is recommended for adults shortly after they have been diagnosed with diabetes.  The Tdap (tetanus, diphtheria, and pertussis) vaccine should be given:  According to normal childhood vaccination schedules, for children.  Every 10 years, for adults who have diabetes. Diabetes  self-management education  Education is recommended at diagnosis and ongoing as needed. Treatment plan  Your treatment plan is reviewed at every medical visit.   This information is not intended to replace advice given to you by your health care provider. Make sure you discuss any questions you have with your health care provider.   Document Released: 08/01/2009 Document Revised: 10/25/2014 Document Reviewed: 03/06/2013 Elsevier Interactive Patient Education [employer].

## 2015-08-05 LAB — MICROALBUMIN, URINE: MICROALB UR: 1.1 mg/dL

## 2015-08-11 ENCOUNTER — Encounter: Payer: Self-pay | Admitting: Family Medicine

## 2015-11-03 DIAGNOSIS — R972 Elevated prostate specific antigen [PSA]: Secondary | ICD-10-CM | POA: Diagnosis not present

## 2015-11-10 ENCOUNTER — Ambulatory Visit (INDEPENDENT_AMBULATORY_CARE_PROVIDER_SITE_OTHER): Payer: Medicare Other | Admitting: Family Medicine

## 2015-11-10 ENCOUNTER — Encounter: Payer: Self-pay | Admitting: Family Medicine

## 2015-11-10 VITALS — BP 136/74 | HR 62 | Temp 98.6°F | Resp 16 | Ht 65.5 in | Wt 176.2 lb

## 2015-11-10 DIAGNOSIS — E785 Hyperlipidemia, unspecified: Secondary | ICD-10-CM

## 2015-11-10 DIAGNOSIS — R972 Elevated prostate specific antigen [PSA]: Secondary | ICD-10-CM | POA: Diagnosis not present

## 2015-11-10 DIAGNOSIS — Z0000 Encounter for general adult medical examination without abnormal findings: Secondary | ICD-10-CM | POA: Diagnosis not present

## 2015-11-10 DIAGNOSIS — E119 Type 2 diabetes mellitus without complications: Secondary | ICD-10-CM

## 2015-11-10 DIAGNOSIS — I10 Essential (primary) hypertension: Secondary | ICD-10-CM | POA: Diagnosis not present

## 2015-11-10 LAB — LIPID PANEL
Cholesterol: 144 mg/dL (ref 125–200)
HDL: 64 mg/dL (ref >=40)
LDL Cholesterol: 71 mg/dL (ref <130)
TRIGLYCERIDES: 46 mg/dL (ref <150)
Total CHOL/HDL Ratio: 2.3 Ratio (ref <=5.0)
VLDL: 9 mg/dL (ref <30)

## 2015-11-10 LAB — COMPLETE METABOLIC PANEL WITH GFR
ALBUMIN: 3.9 g/dL (ref 3.6–5.1)
ALK PHOS: 75 U/L (ref 40–115)
ALT: 8 U/L — AB (ref 9–46)
AST: 12 U/L (ref 10–35)
BILIRUBIN TOTAL: 0.5 mg/dL (ref 0.2–1.2)
BUN: 10 mg/dL (ref 7–25)
CO2: 27 mmol/L (ref 20–31)
CREATININE: 1 mg/dL (ref 0.70–1.18)
Calcium: 8.9 mg/dL (ref 8.6–10.3)
Chloride: 104 mmol/L (ref 98–110)
GFR, EST AFRICAN AMERICAN: 85 mL/min (ref >=60)
GFR, Est Non African American: 74 mL/min (ref >=60)
Glucose, Bld: 123 mg/dL — ABNORMAL HIGH (ref 65–99)
Potassium: 4.6 mmol/L (ref 3.5–5.3)
Sodium: 142 mmol/L (ref 135–146)
Total Protein: 6.6 g/dL (ref 6.1–8.1)

## 2015-11-10 LAB — POCT GLYCOSYLATED HEMOGLOBIN (HGB A1C): HEMOGLOBIN A1C: 6.6

## 2015-11-10 LAB — GLUCOSE, POCT (MANUAL RESULT ENTRY): POC Glucose: 147 mg/dl — AB (ref 70–99)

## 2015-11-10 MED ORDER — GLIMEPIRIDE 2 MG PO TABS: 2.0000 mg | ORAL_TABLET | Freq: Two times a day (BID) | ORAL | Status: DC

## 2015-11-10 MED ORDER — AMLODIPINE BESYLATE 5 MG PO TABS: 5.0000 mg | ORAL_TABLET | Freq: Every day | ORAL | Status: DC

## 2015-11-10 MED ORDER — QUINAPRIL HCL 20 MG PO TABS: 20.0000 mg | ORAL_TABLET | Freq: Every day | ORAL | Status: DC

## 2015-11-10 MED ORDER — ATORVASTATIN CALCIUM 20 MG PO TABS: 20.0000 mg | ORAL_TABLET | Freq: Every day | ORAL | Status: DC

## 2015-11-10 NOTE — Patient Instructions (Signed)
Diabetes is doing well.  No change in meds, and follow up in 3-6 months.   You should receive a call or letter about your lab results within the next week to 10 days.

## 2015-11-10 NOTE — Progress Notes (Signed)
Subjective:  By signing my name below, I, Raven Small, attest that this documentation has been prepared under the direction and in the presence of [name], MD.  Electronically Signed: Thea Alken, ED Scribe. 11/10/2015. 8:32 AM.   Patient ID: Tony Price, male    DOB: [birth year], 75 y.o.   MRN: [id number]  HPI Chief Complaint  Patient presents with  . Diabetes  . Hypertension   HPI Comments: Tony Price is a 75 y.o. male who presents to the [facility] for follow up of diabetes and hypertension.   Diabetes Controlled last visit, continued on same dose of meds with hypoglycemia percatuions. On amaryl as well as an ace inhibitor.   Lab Results  Component Value Date   HGBA1C 6.5 08/04/2015   His readings this morning was 132, usually ranges from 98-132. He denies low readings into 50's. He has been compliant with amaryl without adverse effect. He has an appointment with with dentist and optho next month.  Lab Results  Component Value Date   MICROALBUR 1.1 08/04/2015    Hypertension  He is on Norvasc and Accupril 20mg  . Home reading stable at last visit. Stable in office. Continued on same doses.   Lab Results  Component Value Date   CREATININE 1.18 08/04/2015     Patient Active Problem List   Diagnosis Date Noted  . UTI (lower urinary tract infection) 11/17/2014  . DM (diabetes mellitus), type 2 ([place]) 11/17/2014  . HTN (hypertension) 11/17/2014  . Sepsis ([place]) 11/17/2014   Past Medical History  Diagnosis Date  . Hypertension   . Hypercholesterolemia   . Diabetes mellitus   . Kidney stones    Past Surgical History  Procedure Laterality Date  . Eye surgery    . Rotator cuff repair      Right  . Prostate biopsy     No Known Allergies Prior to Admission medications   Medication Sig Start Date End Date Taking? Authorizing Provider  acetaminophen (TYLENOL) 500 MG tablet Take 1,000 mg by mouth every 6 (six) hours as needed for  mild pain or headache.    Historical Provider, MD  amLODipine (NORVASC) 5 MG tablet Take 1 tablet (5 mg total) by mouth daily. 08/04/15   [name], MD  Ascorbic Acid (VITAMIN C) 1000 MG tablet Take 1,000 mg by mouth daily.    Historical Provider, MD  atorvastatin (LIPITOR) 20 MG tablet Take 1 tablet (20 mg total) by mouth daily. 08/04/15   [name], MD  glimepiride (AMARYL) 2 MG tablet Take 1 tablet (2 mg total) by mouth 2 (two) times daily. 08/04/15   [name], MD  latanoprost (XALATAN) 0.005 % ophthalmic solution Place 1 drop into both eyes at bedtime.    Historical Provider, MD  quinapril (ACCUPRIL) 20 MG tablet Take 1 tablet (20 mg total) by mouth daily. 08/04/15   [name], MD   Social History   Social History  . Marital Status: Divorced    Spouse Name: [name]  . Number of Children: [name]  . Years of Education: [name]   Occupational History  . Not on file.   Social History Main Topics  . Smoking status: Former Smoker -- 2 years  . Smokeless tobacco: Never Used  . Alcohol Use: No  . Drug Use: No  . Sexual Activity: Not on file   Other Topics Concern  . Not on file   Social History Narrative   Review of Systems  Constitutional: Negative for fatigue and unexpected weight change.  Eyes: Negative for visual disturbance.  Respiratory: Negative for cough, chest tightness and shortness of breath.   Cardiovascular: Negative for chest pain, palpitations and leg swelling.  Gastrointestinal: Negative for abdominal pain and blood in stool.  Neurological: Negative for dizziness, light-headedness and headaches.    Objective:   Physical Exam  Constitutional: He is oriented to person, place, and time. He appears well-developed and well-nourished.  HENT:  Head: Normocephalic and atraumatic.  Eyes: EOM are normal. Pupils are equal, round, and reactive to light.  Neck: No JVD present. Carotid bruit is not present.  Cardiovascular: Normal rate, regular rhythm  and normal heart sounds.   No murmur heard. Pulmonary/Chest: Effort normal and breath sounds normal. He has no rales.  Musculoskeletal: He exhibits no edema.  Neurological: He is alert and oriented to person, place, and time.  Skin: Skin is warm and dry.  Psychiatric: He has a normal mood and affect.  Vitals reviewed.   Filed Vitals:   11/10/15 0847 11/10/15 0851  BP: 146/72 136/74  Pulse: 62   Temp: 98.6 F (37 C)   TempSrc: Oral   Resp: 16   Height: 5' 5.5" (1.664 m)   Weight: 176 lb 3.2 oz (79.924 kg)    Results for orders placed or performed in visit on 11/10/15  POCT glucose (manual entry)  Result Value Ref Range   POC Glucose 147 (A) 70 - 99 mg/dl  POCT glycosylated hemoglobin (Hb A1C)  Result Value Ref Range   Hemoglobin A1C 6.6     Assessment & Plan:   Tony Price is a 75 y.o. male Essential hypertension - Plan: quinapril (ACCUPRIL) 20 MG tablet, amLODipine (NORVASC) 5 MG tablet  -stable. meds refilled, labs pending.   Hyperlipemia - Plan: atorvastatin (LIPITOR) 20 MG tablet, COMPLETE METABOLIC PANEL WITH GFR, Lipid panel  -lipid panel pending. Tolerating meds. Refilled lipitor same dose.   Controlled type 2 diabetes mellitus without complication, without long-term current use of insulin ([place]) - Plan: POCT glucose (manual entry), POCT glycosylated hemoglobin (Hb A1C), glimepiride (AMARYL) 2 MG tablet  -controlled. continue amaryl same dose.  recheck in 3-6 months.   Meds ordered this encounter  Medications  . quinapril (ACCUPRIL) 20 MG tablet    Sig: Take 1 tablet (20 mg total) by mouth daily.    Dispense:  90 tablet    Refill:  1  . atorvastatin (LIPITOR) 20 MG tablet    Sig: Take 1 tablet (20 mg total) by mouth daily.    Dispense:  90 tablet    Refill:  1  . amLODipine (NORVASC) 5 MG tablet    Sig: Take 1 tablet (5 mg total) by mouth daily.    Dispense:  90 tablet    Refill:  1  . glimepiride (AMARYL) 2 MG tablet    Sig: Take 1 tablet (2 mg total)  by mouth 2 (two) times daily.    Dispense:  180 tablet    Refill:  1   Patient Instructions  Diabetes is doing well.  No change in meds, and follow up in 3-6 months.   You should receive a call or letter about your lab results within the next week to 10 days.      I personally performed the services described in this documentation, which was scribed in my presence. The recorded information has been reviewed and considered, and addended by me as needed.

## 2015-11-12 ENCOUNTER — Other Ambulatory Visit: Payer: Self-pay | Admitting: Urology

## 2015-11-12 DIAGNOSIS — R972 Elevated prostate specific antigen [PSA]: Secondary | ICD-10-CM

## 2015-12-01 ENCOUNTER — Ambulatory Visit (HOSPITAL_COMMUNITY): Admission: RE | Admit: 2015-12-01 | Payer: Medicare Other | Source: Ambulatory Visit

## 2015-12-11 ENCOUNTER — Ambulatory Visit (HOSPITAL_COMMUNITY)
Admission: RE | Admit: 2015-12-11 | Discharge: 2015-12-11 | Disposition: A | Discharge status: Home / Self Care | Payer: Medicare Other | Source: Ambulatory Visit | Attending: Urology | Admitting: Urology

## 2015-12-11 DIAGNOSIS — R972 Elevated prostate specific antigen [PSA]: Secondary | ICD-10-CM | POA: Insufficient documentation

## 2015-12-11 DIAGNOSIS — N400 Enlarged prostate without lower urinary tract symptoms: Secondary | ICD-10-CM | POA: Insufficient documentation

## 2015-12-11 MED ORDER — GADOBENATE DIMEGLUMINE 529 MG/ML IV SOLN
20.0000 mL | Freq: Once | INTRAVENOUS | Status: AC | PRN
  Administered 2015-12-11: 16 mL via INTRAVENOUS

## 2015-12-15 DIAGNOSIS — H402222 Chronic angle-closure glaucoma, left eye, moderate stage: Secondary | ICD-10-CM | POA: Diagnosis not present

## 2015-12-15 DIAGNOSIS — H402211 Chronic angle-closure glaucoma, right eye, mild stage: Secondary | ICD-10-CM | POA: Diagnosis not present

## 2015-12-15 DIAGNOSIS — H02833 Dermatochalasis of right eye, unspecified eyelid: Secondary | ICD-10-CM | POA: Diagnosis not present

## 2015-12-15 DIAGNOSIS — H02403 Unspecified ptosis of bilateral eyelids: Secondary | ICD-10-CM | POA: Diagnosis not present

## 2016-02-11 ENCOUNTER — Telehealth: Payer: Self-pay | Admitting: *Deleted

## 2016-02-11 ENCOUNTER — Ambulatory Visit (INDEPENDENT_AMBULATORY_CARE_PROVIDER_SITE_OTHER): Payer: Medicare Other | Admitting: Family Medicine

## 2016-02-11 ENCOUNTER — Encounter: Payer: Self-pay | Admitting: Family Medicine

## 2016-02-11 VITALS — BP 152/73 | HR 65 | Temp 99.3°F | Resp 16 | Ht 66.0 in | Wt 175.6 lb

## 2016-02-11 DIAGNOSIS — E119 Type 2 diabetes mellitus without complications: Secondary | ICD-10-CM | POA: Diagnosis not present

## 2016-02-11 DIAGNOSIS — I10 Essential (primary) hypertension: Secondary | ICD-10-CM

## 2016-02-11 DIAGNOSIS — Z136 Encounter for screening for cardiovascular disorders: Secondary | ICD-10-CM

## 2016-02-11 LAB — POCT GLYCOSYLATED HEMOGLOBIN (HGB A1C): Hemoglobin A1C: 6.9

## 2016-02-11 LAB — GLUCOSE, POCT (MANUAL RESULT ENTRY): POC GLUCOSE: 132 mg/dL — AB (ref 70–99)

## 2016-02-11 NOTE — Telephone Encounter (Signed)
Spoke with pt regarding Hgb A1c 6.9 still controlled. Per Dr. Carlota Raspberry no changes in plan, will see him back in 3 mos.

## 2016-02-11 NOTE — Progress Notes (Signed)
Subjective:  By signing my name below, I, Tony Price, attest that this documentation has been prepared under the direction and in the presence of Tony Ray, MD. Electronically Signed: [name], [place]. 02/11/2016 , 8:47 AM .  Patient was seen in Room 22 .   Patient ID: Tony Price, male    DOB: Oct 01, 1941, 75 y.o.   MRN: [id number] Chief Complaint  Patient presents with  . Diabetes  . Hypertension   HPI Tony Price is a 75 y.o. male Here for follow up DM and HTN. Last visit in January.   DM He takes amaryl 2mg  bid. He is on an ACE inihibitor and a statin. Dentist and ophthal were earlier this year.   Lab Results  Component Value Date   MICROALBUR 1.1 08/04/2015   Lab Results  Component Value Date   HGBA1C 6.6 11/10/2015   His sugar has been ranging 105-150s.   HTN He was stable last visit, continued medications at the same doses.   He hasn't been checking his BP at home. He exercises 3-4 times a week. He denies missing any doses of his medications. He denies chest pain, cough, or shortness of breath.   HLD He's taking lipitor 20mg  qd. He denies any new muscle aches.   Lab Results  Component Value Date   CHOL 144 11/10/2015   HDL 64 11/10/2015   LDLCALC 71 11/10/2015   TRIG 46 11/10/2015   CHOLHDL 2.3 11/10/2015   Lab Results  Component Value Date   ALT 8* 11/10/2015   AST 12 11/10/2015   ALKPHOS 75 11/10/2015   BILITOT 0.5 11/10/2015    Patient Active Problem List   Diagnosis Date Noted  . UTI (lower urinary tract infection) 11/17/2014  . DM (diabetes mellitus), type 2 ([place]) 11/17/2014  . HTN (hypertension) 11/17/2014  . Sepsis ([place]) 11/17/2014   Past Medical History  Diagnosis Date  . Hypertension   . Hypercholesterolemia   . Diabetes mellitus   . Kidney stones    Past Surgical History  Procedure Laterality Date  . Eye surgery    . Rotator cuff repair      Right  . Prostate biopsy     No Known Allergies Prior to  Admission medications   Medication Sig Start Date End Date Taking? Authorizing Provider  acetaminophen (TYLENOL) 500 MG tablet Take 1,000 mg by mouth every 6 (six) hours as needed for mild pain or headache.   Yes Historical Provider, MD  amLODipine (NORVASC) 5 MG tablet Take 1 tablet (5 mg total) by mouth daily. 11/10/15  Yes [name], MD  Ascorbic Acid (VITAMIN C) 1000 MG tablet Take 1,000 mg by mouth daily.   Yes Historical Provider, MD  atorvastatin (LIPITOR) 20 MG tablet Take 1 tablet (20 mg total) by mouth daily. 11/10/15  Yes [name], MD  glimepiride (AMARYL) 2 MG tablet Take 1 tablet (2 mg total) by mouth 2 (two) times daily. 11/10/15  Yes [name], MD  latanoprost (XALATAN) 0.005 % ophthalmic solution Place 1 drop into both eyes at bedtime.   Yes Historical Provider, MD  quinapril (ACCUPRIL) 20 MG tablet Take 1 tablet (20 mg total) by mouth daily. 11/10/15  Yes [name], MD   Social History   Social History  . Marital Status: Divorced    Spouse Name: [name]  . Number of Children: [name]  . Years of Education: [name]   Occupational History  . Not on file.   Social History  Main Topics  . Smoking status: Former Smoker -- 2 years  . Smokeless tobacco: Never Used  . Alcohol Use: No  . Drug Use: No  . Sexual Activity: Not on file   Other Topics Concern  . Not on file   Social History Narrative   Review of Systems  Constitutional: Negative for fatigue and unexpected weight change.  Eyes: Negative for visual disturbance.  Respiratory: Negative for cough, chest tightness and shortness of breath.   Cardiovascular: Negative for chest pain, palpitations and leg swelling.  Gastrointestinal: Negative for abdominal pain and Price in stool.  Neurological: Negative for dizziness, light-headedness and headaches.      Objective:   Physical Exam  Constitutional: He is oriented to person, place, and time. He appears well-developed and well-nourished.  HENT:  Head:  Normocephalic and atraumatic.  Eyes: EOM are normal. Pupils are equal, round, and reactive to light.  Neck: No JVD present. Carotid bruit is not present.  Cardiovascular: Normal rate, regular rhythm and normal heart sounds.   No murmur heard. Pulmonary/Chest: Effort normal and breath sounds normal. He has no rales.  Musculoskeletal: He exhibits no edema.  Neurological: He is alert and oriented to person, place, and time.  Skin: Skin is warm and dry.  Thickened toenails, no wounds, no significant callus  Psychiatric: He has a normal mood and affect.  Vitals reviewed.   Filed Vitals:   02/11/16 0808  BP: 152/73  Pulse: 65  Temp: 99.3 F (37.4 C)  TempSrc: Oral  Resp: 16  Height: 5\' 6"  (1.676 m)  Weight: 175 lb 9.6 oz (79.652 kg)  SpO2: 97%   EKG: sinus rhythm no acute findings  Results for orders placed or performed in visit on 02/11/16  POCT glucose (manual entry)  Result Value Ref Range   POC Glucose 132 (A) 70 - 99 mg/dl  POCT glycosylated hemoglobin (Hb A1C)  Result Value Ref Range   Hemoglobin A1C 6.9        Assessment & Plan:   Tony Price is a 75 y.o. male Essential hypertension - Plan: EKG 12-Lead  - borderline. Prior controlled. Check outside readings and if over 140/90 - call/rtc to change meds. Same doses for now.   Controlled type 2 diabetes mellitus without complication, without long-term current use of insulin ([place]) - Plan: POCT glucose (manual entry), POCT glycosylated hemoglobin (Hb A1C), EKG 12-Lead  -controlled. Cont same dose amaryl, recheck in 3 months, then if still controlled - 6 month follow up.   Screening for heart disease - Plan: EKG 12-Lead. Asymptomatic.    Meds ordered this encounter  Medications  . carboxymethylcellulose (REFRESH PLUS) 0.5 % SOLN    Sig:    Patient Instructions       IF you received an x-Price today, you will receive an invoice from [facility]. Please contact [facility] at [phone] with  questions or concerns regarding your invoice.   IF you received labwork today, you will receive an invoice from [employer]. Please contact [facility] at [phone] with questions or concerns regarding your invoice.   Our billing staff will not be able to assist you with questions regarding bills from these companies.  You will be contacted with the lab results as soon as they are available. The fastest way to get your results is to activate your My Chart account. Instructions are located on the last page of this paperwork. If you have not heard from [place] regarding the results in 2 weeks, please  contact this office.     No change in meds for now. Keep a record of your Price pressures outside of the office and if remaining over 140/90 - call or return to discuss possible med changes.   Recheck in 3 months for diabetes as med refills will be due then.  If still well controlled t that time - can stretch out future visit to 6 months.        I personally performed the services described in this documentation, which was scribed in my presence. The recorded information has been reviewed and considered, and addended by me as needed.

## 2016-02-11 NOTE — Patient Instructions (Signed)
     IF you received an x-ray today, you will receive an invoice from [facility]. Please contact [facility] at [phone] with questions or concerns regarding your invoice.   IF you received labwork today, you will receive an invoice from [employer]. Please contact [facility] at [phone] with questions or concerns regarding your invoice.   Our billing staff will not be able to assist you with questions regarding bills from these companies.  You will be contacted with the lab results as soon as they are available. The fastest way to get your results is to activate your My Chart account. Instructions are located on the last page of this paperwork. If you have not heard from [place] regarding the results in 2 weeks, please contact this office.     No change in meds for now. Keep a record of your blood pressures outside of the office and if remaining over 140/90 - call or return to discuss possible med changes.   Recheck in 3 months for diabetes as med refills will be due then.  If still well controlled t that time - can stretch out future visit to 6 months.

## 2016-02-25 ENCOUNTER — Ambulatory Visit (INDEPENDENT_AMBULATORY_CARE_PROVIDER_SITE_OTHER): Payer: Medicare Other | Admitting: Podiatry

## 2016-02-25 ENCOUNTER — Encounter: Payer: Self-pay | Admitting: Podiatry

## 2016-02-25 VITALS — BP 170/83 | HR 92 | Ht 65.5 in | Wt 175.0 lb

## 2016-02-25 DIAGNOSIS — B351 Tinea unguium: Secondary | ICD-10-CM | POA: Diagnosis not present

## 2016-02-25 DIAGNOSIS — M21969 Unspecified acquired deformity of unspecified lower leg: Secondary | ICD-10-CM

## 2016-02-25 DIAGNOSIS — M79606 Pain in leg, unspecified: Secondary | ICD-10-CM

## 2016-02-25 NOTE — Progress Notes (Signed)
SUBJECTIVE: 75 y.o. year old male presents requesting for diabetic foot care. Been diabetic since 2005. Blood sugar was 98 this morning.  Participates in GYM exercise.  Patient is referred by Dr. Ronnald Ramp.  Has had diabetic shoes from [facility] last year.   REVIEW OF SYSTEMS: A comprehensive review of systems was negative.  OBJECTIVE: DERMATOLOGIC EXAMINATION: Nails: thick dystrophic nails on both great toes.  VASCULAR EXAMINATION OF LOWER LIMBS: Pedal pulses: All pedal pulses are palpable with normal pulsation.  Capillary Filling times within 3 seconds in all digits.  No edema or erythema noted. Temperature gradient from tibial crest to dorsum of foot is within normal bilateral.  NEUROLOGIC EXAMINATION OF THE LOWER LIMBS: Achilles DTR is present and within normal. Monofilament (Semmes-Weinstein 10-gm) sensory testing positive 6 out of 6, bilateral. Vibratory sensations(128Hz  turning fork) intact at medial and lateral forefoot bilateral.  Sharp and Dull discriminatory sensations at the plantar ball of hallux is intact bilateral.   MUSCULOSKELETAL EXAMINATION: Hypermobile and elevated first ray bilateral. Forefoot varus bilateral.  ASSESSMENT: 1. NIDDM under control. 2. Deformed metatarsal, hypermobile first ray, forefoot varus bilateral.   PLAN: All nails debrided. Clinical findings reviewed. Diabetic shoe form dispensed to return with PCP certification. Return in 3 month for routine foot care.

## 2016-02-25 NOTE — Patient Instructions (Signed)
Seen for diabetic foot care. All nails debrided. May benefit from diabetic shoe for weak metatarsal bones. Return in 3 month or as needed for routine foot care.

## 2016-02-26 ENCOUNTER — Telehealth: Payer: Self-pay | Admitting: Family Medicine

## 2016-03-02 DIAGNOSIS — R972 Elevated prostate specific antigen [PSA]: Secondary | ICD-10-CM | POA: Diagnosis not present

## 2016-03-08 DIAGNOSIS — Z0000 Encounter for general adult medical examination without abnormal findings: Secondary | ICD-10-CM | POA: Diagnosis not present

## 2016-03-08 DIAGNOSIS — R351 Nocturia: Secondary | ICD-10-CM | POA: Diagnosis not present

## 2016-03-08 DIAGNOSIS — N401 Enlarged prostate with lower urinary tract symptoms: Secondary | ICD-10-CM | POA: Diagnosis not present

## 2016-03-08 DIAGNOSIS — R972 Elevated prostate specific antigen [PSA]: Secondary | ICD-10-CM | POA: Diagnosis not present

## 2016-03-31 DIAGNOSIS — H402211 Chronic angle-closure glaucoma, right eye, mild stage: Secondary | ICD-10-CM | POA: Diagnosis not present

## 2016-03-31 DIAGNOSIS — H2512 Age-related nuclear cataract, left eye: Secondary | ICD-10-CM | POA: Diagnosis not present

## 2016-03-31 DIAGNOSIS — H35341 Macular cyst, hole, or pseudohole, right eye: Secondary | ICD-10-CM | POA: Diagnosis not present

## 2016-03-31 DIAGNOSIS — I7090 Unspecified atherosclerosis: Secondary | ICD-10-CM | POA: Diagnosis not present

## 2016-03-31 DIAGNOSIS — E11319 Type 2 diabetes mellitus with unspecified diabetic retinopathy without macular edema: Secondary | ICD-10-CM | POA: Insufficient documentation

## 2016-03-31 DIAGNOSIS — H402222 Chronic angle-closure glaucoma, left eye, moderate stage: Secondary | ICD-10-CM | POA: Diagnosis not present

## 2016-03-31 DIAGNOSIS — H25012 Cortical age-related cataract, left eye: Secondary | ICD-10-CM | POA: Diagnosis not present

## 2016-03-31 HISTORY — DX: Type 2 diabetes mellitus with unspecified diabetic retinopathy without macular edema: E11.319

## 2016-03-31 LAB — HM DIABETES EYE EXAM

## 2016-05-12 ENCOUNTER — Ambulatory Visit: Payer: Medicare Other | Admitting: Family Medicine

## 2016-05-13 ENCOUNTER — Ambulatory Visit: Payer: Medicare Other | Admitting: Family Medicine

## 2016-05-25 DIAGNOSIS — H2512 Age-related nuclear cataract, left eye: Secondary | ICD-10-CM | POA: Diagnosis not present

## 2016-05-25 DIAGNOSIS — H25012 Cortical age-related cataract, left eye: Secondary | ICD-10-CM | POA: Diagnosis not present

## 2016-05-25 DIAGNOSIS — H25812 Combined forms of age-related cataract, left eye: Secondary | ICD-10-CM | POA: Diagnosis not present

## 2016-05-27 ENCOUNTER — Ambulatory Visit (INDEPENDENT_AMBULATORY_CARE_PROVIDER_SITE_OTHER): Payer: Medicare Other | Admitting: Podiatry

## 2016-05-27 ENCOUNTER — Encounter: Payer: Self-pay | Admitting: Podiatry

## 2016-05-27 VITALS — BP 160/72 | HR 79

## 2016-05-27 DIAGNOSIS — M79673 Pain in unspecified foot: Secondary | ICD-10-CM | POA: Diagnosis not present

## 2016-05-27 DIAGNOSIS — M216X9 Other acquired deformities of unspecified foot: Secondary | ICD-10-CM | POA: Diagnosis not present

## 2016-05-27 DIAGNOSIS — E1140 Type 2 diabetes mellitus with diabetic neuropathy, unspecified: Secondary | ICD-10-CM

## 2016-05-27 DIAGNOSIS — M21969 Unspecified acquired deformity of unspecified lower leg: Secondary | ICD-10-CM

## 2016-05-27 DIAGNOSIS — M79606 Pain in leg, unspecified: Secondary | ICD-10-CM

## 2016-05-27 NOTE — Patient Instructions (Signed)
Seen for diabetic shoes. Both feet measured. Need certification from PCP.

## 2016-05-27 NOTE — Progress Notes (Signed)
SUBJECTIVE: 75 y.o. year old male presents requesting for diabetic shoes.He has taken certification form at his PCP office to sign. The form has not arrived yet. Will take another form to obtain PCP signature.  Been diabetic since 2005. Blood sugar is under control. Participates in GYM exercise.  Patient is referred by Dr. Ronnald Ramp.  Has had diabetic shoes from [facility] last year.   REVIEW OF SYSTEMS: A comprehensive review of systems was negative.  OBJECTIVE: DERMATOLOGIC EXAMINATION: Thick dystrophic nails were trimmed down short by patient himself on both great toes.  VASCULAR EXAMINATION OF LOWER LIMBS: Pedal pulses: All pedal pulses are palpable with normal pulsation.  Capillary Filling times within 3 seconds in all digits.  No edema or erythema noted. Temperature gradient from tibial crest to dorsum of foot is within normal bilateral.  NEUROLOGIC EXAMINATION OF THE LOWER LIMBS: Achilles DTR is present and within normal. Monofilament (Semmes-Weinstein 10-gm) sensory testing positive 6 out of 6, bilateral. Vibratory sensations(128Hz  turning fork) intact at medial and lateral forefoot bilateral.  Sharp and Dull discriminatory sensations at the plantar ball of hallux is intact bilateral.   MUSCULOSKELETAL EXAMINATION: Hypermobile and elevated first ray bilateral. Forefoot varus bilateral.  ASSESSMENT: 1. NIDDM under control. 2. Deformed metatarsal, hypermobile first ray, forefoot varus bilateral.   PLAN: Clinical findings reviewed. Diabetic shoe form dispensed to return with PCP certification. Both feet measured for diabetic shoes.

## 2016-05-31 ENCOUNTER — Telehealth: Payer: Self-pay | Admitting: *Deleted

## 2016-05-31 ENCOUNTER — Telehealth: Payer: Self-pay

## 2016-05-31 NOTE — Telephone Encounter (Addendum)
THIS MESSAGE IS FROM [name]'S DAUGHTER [facility]). SHE WAS TALKING TO HER FATHER AND ASKED HIM IF HE HAD EVER BEEN TESTED FOR HIS MEMORY? HE TOLD HER HE HAS NEVER BEEN CHECKED FOR THIS. SHE STATES HE IS GETTING UP  IN AGE AND SHE WOULD LIKE TO KNOW WHAT THE PROCEDURE IS TO GET HIM CHECKED? HIS PCP IS DR. Carlota Raspberry.  DENITA IS ON HER FATHER'S HIPAA. BEST PHONE [phone] (DENITA T[place] - DAUGHTER)  [place]

## 2016-05-31 NOTE — Telephone Encounter (Signed)
Pt need [place] to fill out form for diabetic shoes and inserts. It has been filled out by podiatry but also needs to be filled out by PCP.  Once form is filled out it can be faxed with notes.    Form is in your box in Dr. Marletta Lor.  Fax number is [phone]

## 2016-06-02 NOTE — Telephone Encounter (Signed)
We often will discuss memory at a Medicare wellness visit. That is usually part of the functional status screening at that visit. I see he is scheduled with me August 31, but do not think that's a Medicare wellness visit. I'm happy to discuss some of his functional status components with him at that time, and we can discuss memory at that time if needed as well. If his daughter has any specific concerns prior to then, can come into office to discuss those sooner, and if she is able to join him at that visit to discuss the concerns, that often works best.

## 2016-06-02 NOTE — Telephone Encounter (Signed)
Any suggestions Dr. Carlota Raspberry?

## 2016-06-03 NOTE — Telephone Encounter (Signed)
Pt's daughter advised.  

## 2016-06-05 ENCOUNTER — Encounter: Payer: Self-pay | Admitting: *Deleted

## 2016-06-05 NOTE — Telephone Encounter (Signed)
Noted. Paperwork reviewed and completed. 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 Recommended extra-depth shoes as well as custom fabricated shoe insert.  Please fax copy of last office visit with me as it appears they need notes on discussion of diabetes.

## 2016-06-07 NOTE — Telephone Encounter (Signed)
Faxed

## 2016-06-17 ENCOUNTER — Ambulatory Visit (INDEPENDENT_AMBULATORY_CARE_PROVIDER_SITE_OTHER): Payer: Medicare Other | Admitting: Family Medicine

## 2016-06-17 ENCOUNTER — Encounter: Payer: Self-pay | Admitting: Family Medicine

## 2016-06-17 VITALS — BP 142/82 | HR 81 | Temp 98.3°F | Resp 18 | Ht 65.5 in | Wt 176.6 lb

## 2016-06-17 DIAGNOSIS — E119 Type 2 diabetes mellitus without complications: Secondary | ICD-10-CM | POA: Diagnosis not present

## 2016-06-17 DIAGNOSIS — H25011 Cortical age-related cataract, right eye: Secondary | ICD-10-CM | POA: Diagnosis not present

## 2016-06-17 DIAGNOSIS — E785 Hyperlipidemia, unspecified: Secondary | ICD-10-CM | POA: Diagnosis not present

## 2016-06-17 DIAGNOSIS — H2511 Age-related nuclear cataract, right eye: Secondary | ICD-10-CM | POA: Diagnosis not present

## 2016-06-17 DIAGNOSIS — I10 Essential (primary) hypertension: Secondary | ICD-10-CM | POA: Diagnosis not present

## 2016-06-17 LAB — BASIC METABOLIC PANEL
BUN: 13 mg/dL (ref 7–25)
CALCIUM: 9.4 mg/dL (ref 8.6–10.3)
CO2: 27 mmol/L (ref 20–31)
Chloride: 105 mmol/L (ref 98–110)
Creat: 1.16 mg/dL (ref 0.70–1.18)
Glucose, Bld: 107 mg/dL — ABNORMAL HIGH (ref 65–99)
POTASSIUM: 4.6 mmol/L (ref 3.5–5.3)
SODIUM: 144 mmol/L (ref 135–146)

## 2016-06-17 MED ORDER — GLUCOSE BLOOD VI STRP: ORAL_STRIP | 4 refills | Status: DC

## 2016-06-17 NOTE — Progress Notes (Addendum)
By signing my name below, I, Mesha Guinyard, attest that this documentation has been prepared under the direction and in the presence of [name].  Electronically Signed: Verlee Monte, Medical Scribe. 06/17/16. 1:28 PM.  Subjective:    Patient ID: Tony Price, male    DOB: [birth year], 75 y.o.   MRN: [id number]  HPI Chief Complaint  Patient presents with  . Follow-up    His 3 month check up  . Flu Vaccine    pt. declines flu shot    HPI Comments: Tony Price is a 74 y.o. male with a MHx of HTN, HLD, and DM who presents to the [facility] for DM follow-up. Pt last ate this morning.  DM: Takes Amaryl 2 mg BID. Pt's blood sugar has been ranging 118-198. Pt would like to get a refill on Prodigy testing strings. Telephone note Aug 14th concerning memory testing. Pt's daughter is concerned about pt loosing his memory. Pt mentions he forgets what people tells him a few seconds after speaking. Pt states he may have hearing difficulty. Pt denies experiencing any negative side effects or hyperglycemic symptoms while on his medication. Pt denies forgetting faces, daily activities such as brushing his teeth and paying bills.   Lab Results  Component Value Date   HGBA1C 6.9 02/11/2016   Lab Results  Component Value Date   MICROALBUR 1.1 08/04/2015   HTN: Takes quinapril 20 mg QD.  Was continued on the same dose at last visit. Lab Results  Component Value Date   CREATININE 1.00 11/10/2015   HLD: Takes lipitor 20 mg QD. Pt was compliant with his medications at the last visit. Pt denies experiencing any negative side effects while on this medication. Lab Results  Component Value Date   CHOL 144 11/10/2015   HDL 64 11/10/2015   LDLCALC 71 11/10/2015   TRIG 46 11/10/2015   CHOLHDL 2.3 11/10/2015   Lab Results  Component Value Date   ALT 8 (L) 11/10/2015   AST 12 11/10/2015   ALKPHOS 75 11/10/2015   BILITOT 0.5 11/10/2015   Left sided back pain: Pt  reports left sided back pain for the past 5-6 months.  Patient Active Problem List   Diagnosis Date Noted  . Onychomycosis 02/25/2016  . UTI (lower urinary tract infection) 11/17/2014  . DM (diabetes mellitus), type 2 ([place]) 11/17/2014  . HTN (hypertension) 11/17/2014  . Sepsis ([place]) 11/17/2014   Past Medical History:  Diagnosis Date  . Diabetes mellitus   . Hypercholesterolemia   . Hypertension   . Kidney stones    Past Surgical History:  Procedure Laterality Date  . CATARACT EXTRACTION    . EYE SURGERY    . PROSTATE BIOPSY    . ROTATOR CUFF REPAIR     Right   No Known Allergies Prior to Admission medications   Medication Sig Start Date End Date Taking? Authorizing Provider  amLODipine (NORVASC) 5 MG tablet Take 1 tablet (5 mg total) by mouth daily. 11/10/15  Yes [name], MD  Ascorbic Acid (VITAMIN C) 1000 MG tablet Take 1,000 mg by mouth daily.   Yes Historical Provider, MD  atorvastatin (LIPITOR) 20 MG tablet Take 1 tablet (20 mg total) by mouth daily. 11/10/15  Yes [name], MD  carboxymethylcellulose (REFRESH PLUS) 0.5 % SOLN    Yes Historical Provider, MD  glimepiride (AMARYL) 2 MG tablet Take 1 tablet (2 mg total) by mouth 2 (two) times daily. 11/10/15  Yes Ranell Patrick  Carlota Raspberry, MD  quinapril (ACCUPRIL) 20 MG tablet Take 1 tablet (20 mg total) by mouth daily. 11/10/15  Yes [name], MD  acetaminophen (TYLENOL) 500 MG tablet Take 1,000 mg by mouth every 6 (six) hours as needed for mild pain or headache.    Historical Provider, MD  latanoprost (XALATAN) 0.005 % ophthalmic solution Place 1 drop into both eyes at bedtime.    Historical Provider, MD   Social History   Social History  . Marital status: Divorced    Spouse name: [name]  . Number of children: [name]  . Years of education: [name]   Occupational History  . Not on file.   Social History Main Topics  . Smoking status: Former Smoker    Years: 2.00  . Smokeless tobacco: Never Used  . Alcohol use No    . Drug use: No  . Sexual activity: Not on file   Other Topics Concern  . Not on file   Social History Narrative  . No narrative on file   Depression screen [facility] 2/9 06/17/2016 02/11/2016 11/10/2015 08/04/2015 03/17/2015  Decreased Interest 0 0 0 0 0  Down, Depressed, Hopeless 0 0 0 0 0  PHQ - 2 Score 0 0 0 0 0   Review of Systems  Constitutional: Negative for fatigue and unexpected weight change.  HENT: Positive for hearing loss.   Eyes: Negative for visual disturbance.  Respiratory: Negative for cough, chest tightness and shortness of breath.   Cardiovascular: Negative for chest pain, palpitations and leg swelling.  Gastrointestinal: Negative for abdominal pain and blood in stool.  Neurological: Negative for dizziness, light-headedness and headaches.  Psychiatric/Behavioral: Negative for confusion.   Objective:  Physical Exam  Constitutional: He is oriented to person, place, and time. He appears well-developed and well-nourished.  HENT:  Head: Normocephalic and atraumatic.  Eyes: EOM are normal. Pupils are equal, round, and reactive to light.  Neck: No JVD present. Carotid bruit is not present.  Cardiovascular: Normal rate, regular rhythm and normal heart sounds.   No murmur heard. Pulmonary/Chest: Effort normal and breath sounds normal. He has no rales.  Musculoskeletal: He exhibits no edema.  Neurological: He is alert and oriented to person, place, and time.  ADL and IADL intact MMSC 29/30  Skin: Skin is warm and dry.  Psychiatric: He has a normal mood and affect.  Vitals reviewed.  BP (!) 142/82 (BP Location: Right Arm, Patient Position: Sitting, Cuff Size: Normal)   Pulse 81   Temp 98.3 F (36.8 C) (Oral)   Resp 18   Ht 5' 5.5" (1.664 m)   Wt 176 lb 9.6 oz (80.1 kg)   SpO2 96%   BMI 28.94 kg/m  Assessment & Plan:   Tony Price is a 75 y.o. male Type 2 diabetes mellitus without complication, without long-term current use of insulin ([place]) - Plan: glucose  blood test strip, Hemoglobin A1C  -Check A1c. No change in meds for now.  Essential hypertension - Plan: Basic metabolic panel  - BMP pending. Tolerating medications, no change for now.  Hyperlipidemia  -Tolerating Lipitor. No change in meds for now  MMSE checked given daughters question about memory testing. No specific memory dysfunction detected, typical age-related memory changes were discussed, RTC precautions if new memory symptoms. There may be a component of hearing deficit. Recommended audiology evaluation/hearing screen. Advised to let me know if referral needed.  Meds ordered this encounter  Medications  . glucose blood test strip    Sig: Use as  instructed, with blood sugar testing once per day.    Dispense:  100 each    Refill:  4    Prodigy meter - please dispense appropriate testing strips   Patient Instructions       IF you received an x-ray today, you will receive an invoice from [facility]. Please contact [facility] Radiology at [phone] with questions or concerns regarding your invoice.   IF you received labwork today, you will receive an invoice from [employer]. Please contact [facility] at [phone] with questions or concerns regarding your invoice.   Our billing staff will not be able to assist you with questions regarding bills from these companies.  You will be contacted with the lab results as soon as they are available. The fastest way to get your results is to activate your My Chart account. Instructions are located on the last page of this paperwork. If you have not heard from [place] regarding the results in 2 weeks, please contact this office.    I do not see any concerns on your memory testing today. No change in diabetes medications for now, and will let you know once we have the results back to see if any changes are needed. I do recommend you have a hearing test done. If you are unable to find a location in the  community, let me know and I will place a referral.   I personally performed the services described in this documentation, which was scribed in my presence. The recorded information has been reviewed and considered, and addended by me as needed.   Signed,   [name], MD [facility].  06/19/16 5:41 PM

## 2016-06-17 NOTE — Patient Instructions (Addendum)
     IF you received an x-ray today, you will receive an invoice from [facility] Radiology. Please contact [facility]iology at [phone] with questions or concerns regarding your invoice.   IF you received labwork today, you will receive an invoice from [employer]. Please contact [facility] at [phone] with questions or concerns regarding your invoice.   Our billing staff will not be able to assist you with questions regarding bills from these companies.  You will be contacted with the lab results as soon as they are available. The fastest way to get your results is to activate your My Chart account. Instructions are located on the last page of this paperwork. If you have not heard from [place] regarding the results in 2 weeks, please contact this office.    I do not see any concerns on your memory testing today. No change in diabetes medications for now, and will let you know once we have the results back to see if any changes are needed. I do recommend you have a hearing test done. If you are unable to find a location in the community, let me know and I will place a referral.

## 2016-06-18 LAB — HEMOGLOBIN A1C
HEMOGLOBIN A1C: 6.7 % — AB (ref <5.7)
MEAN PLASMA GLUCOSE: 146 mg/dL

## 2016-06-22 DIAGNOSIS — H2511 Age-related nuclear cataract, right eye: Secondary | ICD-10-CM | POA: Diagnosis not present

## 2016-06-22 DIAGNOSIS — H25011 Cortical age-related cataract, right eye: Secondary | ICD-10-CM | POA: Diagnosis not present

## 2016-06-22 DIAGNOSIS — H25811 Combined forms of age-related cataract, right eye: Secondary | ICD-10-CM | POA: Diagnosis not present

## 2016-07-01 ENCOUNTER — Other Ambulatory Visit: Payer: Self-pay

## 2016-07-01 MED ORDER — ONETOUCH ULTRA SYSTEM W/DEVICE KIT: PACK | 0 refills | Status: DC

## 2016-07-01 MED ORDER — GLUCOSE BLOOD VI STRP: ORAL_STRIP | 3 refills | Status: DC

## 2016-07-01 MED ORDER — ONETOUCH ULTRASOFT LANCETS MISC: 3 refills | Status: DC

## 2016-07-07 ENCOUNTER — Ambulatory Visit (INDEPENDENT_AMBULATORY_CARE_PROVIDER_SITE_OTHER): Payer: Medicare Other | Admitting: Ophthalmology

## 2016-07-07 DIAGNOSIS — H35341 Macular cyst, hole, or pseudohole, right eye: Secondary | ICD-10-CM

## 2016-07-07 DIAGNOSIS — H43813 Vitreous degeneration, bilateral: Secondary | ICD-10-CM | POA: Diagnosis not present

## 2016-07-07 DIAGNOSIS — I10 Essential (primary) hypertension: Secondary | ICD-10-CM

## 2016-07-07 DIAGNOSIS — E11319 Type 2 diabetes mellitus with unspecified diabetic retinopathy without macular edema: Secondary | ICD-10-CM

## 2016-07-07 DIAGNOSIS — H35033 Hypertensive retinopathy, bilateral: Secondary | ICD-10-CM

## 2016-07-07 DIAGNOSIS — E113291 Type 2 diabetes mellitus with mild nonproliferative diabetic retinopathy without macular edema, right eye: Secondary | ICD-10-CM

## 2016-07-28 ENCOUNTER — Other Ambulatory Visit: Payer: Self-pay | Admitting: Family Medicine

## 2016-07-28 DIAGNOSIS — E785 Hyperlipidemia, unspecified: Secondary | ICD-10-CM

## 2016-07-29 ENCOUNTER — Other Ambulatory Visit: Payer: Self-pay | Admitting: Family Medicine

## 2016-07-29 DIAGNOSIS — I10 Essential (primary) hypertension: Secondary | ICD-10-CM

## 2016-07-29 DIAGNOSIS — E119 Type 2 diabetes mellitus without complications: Secondary | ICD-10-CM

## 2016-07-31 ENCOUNTER — Ambulatory Visit (INDEPENDENT_AMBULATORY_CARE_PROVIDER_SITE_OTHER): Payer: Medicare Other | Admitting: Family Medicine

## 2016-07-31 ENCOUNTER — Ambulatory Visit (INDEPENDENT_AMBULATORY_CARE_PROVIDER_SITE_OTHER): Payer: Medicare Other

## 2016-07-31 VITALS — BP 136/80 | HR 65 | Temp 98.2°F | Resp 17 | Ht 65.5 in | Wt 174.0 lb

## 2016-07-31 DIAGNOSIS — R0781 Pleurodynia: Secondary | ICD-10-CM

## 2016-07-31 DIAGNOSIS — Z87442 Personal history of urinary calculi: Secondary | ICD-10-CM | POA: Diagnosis not present

## 2016-07-31 DIAGNOSIS — R972 Elevated prostate specific antigen [PSA]: Secondary | ICD-10-CM

## 2016-07-31 DIAGNOSIS — N3091 Cystitis, unspecified with hematuria: Secondary | ICD-10-CM | POA: Diagnosis not present

## 2016-07-31 DIAGNOSIS — R109 Unspecified abdominal pain: Secondary | ICD-10-CM

## 2016-07-31 LAB — POCT URINALYSIS DIP (MANUAL ENTRY)
BILIRUBIN UA: NEGATIVE
BILIRUBIN UA: NEGATIVE
GLUCOSE UA: NEGATIVE
Nitrite, UA: POSITIVE — AB
Protein Ur, POC: 30 — AB
SPEC GRAV UA: 1.02
Urobilinogen, UA: 0.2
pH, UA: 5.5

## 2016-07-31 LAB — BASIC METABOLIC PANEL
BUN: 15 mg/dL (ref 7–25)
CHLORIDE: 106 mmol/L (ref 98–110)
CO2: 26 mmol/L (ref 20–31)
Calcium: 9.2 mg/dL (ref 8.6–10.3)
Creat: 1.08 mg/dL (ref 0.70–1.18)
Glucose, Bld: 114 mg/dL — ABNORMAL HIGH (ref 65–99)
POTASSIUM: 4.6 mmol/L (ref 3.5–5.3)
Sodium: 142 mmol/L (ref 135–146)

## 2016-07-31 LAB — POC MICROSCOPIC URINALYSIS (UMFC)

## 2016-07-31 LAB — CBC
HEMATOCRIT: 40 % (ref 38.5–50.0)
Hemoglobin: 12.8 g/dL — ABNORMAL LOW (ref 13.2–17.1)
MCH: 26.3 pg — ABNORMAL LOW (ref 27.0–33.0)
MCHC: 32 g/dL (ref 32.0–36.0)
MCV: 82.1 fL (ref 80.0–100.0)
MPV: 9.7 fL (ref 7.5–12.5)
PLATELETS: 273 10*3/uL (ref 140–400)
RBC: 4.87 MIL/uL (ref 4.20–5.80)
RDW: 14.8 % (ref 11.0–15.0)
WBC: 6.2 10*3/uL (ref 3.8–10.8)

## 2016-07-31 LAB — PSA: PSA: 7.3 ng/mL — AB (ref <=4.0)

## 2016-07-31 MED ORDER — CIPROFLOXACIN HCL 500 MG PO TABS: 500.0000 mg | ORAL_TABLET | Freq: Two times a day (BID) | ORAL | 0 refills | Status: DC

## 2016-07-31 NOTE — Progress Notes (Addendum)
By signing my name below I, [name], attest that this documentation has been prepared under the direction and in the presence of [name], MD. Electonically Signed. [name], Scribe 07/31/2016 at 12:12 PM   Subjective:    Patient ID: Tony Price, male    DOB: January 14, 1941, 75 y.o.   MRN: [id number]  Chief Complaint  Patient presents with  . Flank Pain    Left.     HPI Tony Price is a 75 y.o. male who presents to the [facility] complaining of left flank pain that is constant for the past several months. Pain is in the lower left ribs along the posterior axil line.  Pain worsened by laying on his left side. Pt denies any dysuria, urinary frequency, or hematuria. Pt denies any cough or SOB. Pt denies any falls or trauma. Pt reports history of kidney stones and states that pain today is different than past kidney stones.  At last visit on 06/17/16. Pt was c/o off and on left flank pain for the past 5-6 months, thought to be DDD. Pt was admitted to the hospital due to Sepsis secondary to UTI in January 2016,  Pt has a history of DM. Pt has been checking his blood sugar once a day.   Followed by Dr. Junious Silk for prostate.  Last PSA around 6.5, negative biopsies in past. Monitoring for now. Has appt coming up.   Patient Active Problem List   Diagnosis Date Noted  . Onychomycosis 02/25/2016  . UTI (lower urinary tract infection) 11/17/2014  . DM (diabetes mellitus), type 2 ([place]) 11/17/2014  . HTN (hypertension) 11/17/2014  . Sepsis ([place]) 11/17/2014   Past Medical History:  Diagnosis Date  . Diabetes mellitus   . Hypercholesterolemia   . Hypertension   . Kidney stones    Past Surgical History:  Procedure Laterality Date  . CATARACT EXTRACTION    . EYE SURGERY    . PROSTATE BIOPSY    . ROTATOR CUFF REPAIR     Right   No Known Allergies Prior to Admission medications   Medication Sig Start Date End Date Taking? Authorizing Provider    acetaminophen (TYLENOL) 500 MG tablet Take 1,000 mg by mouth every 6 (six) hours as needed for mild pain or headache.   Yes Historical Provider, MD  amLODipine (NORVASC) 5 MG tablet TAKE 1 TABLET (5 MG TOTAL) BY MOUTH DAILY. 07/30/16  Yes [name], MD  Ascorbic Acid (VITAMIN C) 1000 MG tablet Take 1,000 mg by mouth daily.   Yes Historical Provider, MD  atorvastatin (LIPITOR) 20 MG tablet TAKE 1 TABLET (20 MG TOTAL) BY MOUTH DAILY. 07/28/16  Yes [name], MD  Blood Glucose Monitoring Suppl (ONE TOUCH ULTRA SYSTEM KIT) w/Device KIT Test blood sugar once daily. Dx: E11.9 07/01/16  Yes [name], MD  carboxymethylcellulose (REFRESH PLUS) 0.5 % SOLN    Yes Historical Provider, MD  glimepiride (AMARYL) 2 MG tablet TAKE 1 TABLET (2 MG TOTAL) BY MOUTH 2 (TWO) TIMES DAILY. 07/30/16  Yes [name], MD  glucose blood test strip Test blood sugar once daily. Dx: E11.9 07/01/16  Yes [name], MD  Lancets [facility] ULTRASOFT) lancets Test blood sugar once daily. Dx: E11.9 07/01/16  Yes [name], MD  latanoprost (XALATAN) 0.005 % ophthalmic solution Place 1 drop into both eyes at bedtime.   Yes Historical Provider, MD  quinapril (ACCUPRIL) 20 MG tablet Take 1 tablet (20 mg  total) by mouth daily. 11/10/15  Yes [name], MD   Social History   Social History  . Marital status: Divorced    Spouse name: [name]  . Number of children: [name]  . Years of education: [name]   Occupational History  . Not on file.   Social History Main Topics  . Smoking status: Former Smoker    Years: 2.00  . Smokeless tobacco: Never Used  . Alcohol use No  . Drug use: No  . Sexual activity: Not on file   Other Topics Concern  . Not on file   Social History Narrative  . No narrative on file      Review of Systems  Constitutional: Negative for fever.  Respiratory: Negative for cough and shortness of breath.   Gastrointestinal: Negative for abdominal pain.  Genitourinary:  Positive for flank pain (left). Negative for dysuria, frequency and hematuria.  Musculoskeletal: Negative for back pain.       Objective:   Physical Exam  Constitutional: He is oriented to person, place, and time. He appears well-developed and well-nourished. No distress.  HENT:  Head: Normocephalic and atraumatic.  Eyes: Conjunctivae are normal. Pupils are equal, round, and reactive to light.  Neck: Neck supple.  Cardiovascular: Normal rate.   Pulmonary/Chest: Effort normal.  Abdominal: There is no tenderness. There is no CVA tenderness.  Musculoskeletal: Normal range of motion.  Pain is in the lower left ribs along the posterior axillary line. There is no tenderness. Pt has pain free ROM in lumbar and thoracic spine.   Neurological: He is alert and oriented to person, place, and time.  Skin: Skin is warm and dry. No bruising and no ecchymosis noted.  No soft tissues swelling or ecchymosis around area pt is c/o pain.  Psychiatric: He has a normal mood and affect. His behavior is normal.  Nursing note and vitals reviewed.   Vitals:   07/31/16 1049  BP: 136/80  Pulse: 65  Resp: 17  Temp: 98.2 F (36.8 C)  TempSrc: Oral  SpO2: 98%  Weight: 174 lb (78.9 kg)  Height: 5' 5.5" (1.664 m)   Dg Ribs Unilateral W/chest Left  Result Date: 07/31/2016 CLINICAL DATA:  Intermittent left lateral rib pain for the past 5 months. No known injury. EXAM: LEFT RIBS AND CHEST - 3+ VIEW COMPARISON:  None. FINDINGS: Normal cardiac silhouette with mild tortuosity of the thoracic aorta. No focal airspace opacities. No pleural effusion or pneumothorax. No evidence of edema. No displaced left-sided rib fractures with special attention paid to area demarcated by the radiopaque arrow. Regional soft tissues appear normal. Post right-sided rotator cuff repair. IMPRESSION: No acute cardiopulmonary disease. Specifically, no displaced left-sided rib fractures with special attention paid to the area demarcated by  the radiopaque arrow. Electronically Signed   By: Sandi Mariscal M.D.   On: 07/31/2016 11:59   Results for orders placed or performed in visit on 07/31/16  POCT urinalysis dipstick  Result Value Ref Range   Color, UA yellow yellow   Clarity, UA cloudy (A) clear   Glucose, UA negative negative   Bilirubin, UA negative negative   Ketones, POC UA negative negative   Spec Grav, UA 1.020    Blood, UA trace-intact (A) negative   pH, UA 5.5    Protein Ur, POC =30 (A) negative   Urobilinogen, UA 0.2    Nitrite, UA Positive (A) Negative   Leukocytes, UA small (1+) (A) Negative  POCT Microscopic Urinalysis ([facility])  Result Value  Ref Range   WBC,UR,HPF,POC Too numerous to count  (A) None WBC/hpf   RBC,UR,HPF,POC Few (A) None RBC/hpf   Bacteria Moderate (A) None, Too numerous to count   Mucus Present (A) Absent   Epithelial Cells, UR Per Microscopy Few (A) None, Too numerous to count cells/hpf         Assessment & Plan:     Tony Price is a 75 y.o. male Left flank pain - Plan: DG Ribs Unilateral W/Chest Left, POCT Microscopic Urinalysis ([facility]), Urine culture, Basic metabolic panel, CBC  History of nephrolithiasis - Plan: POCT urinalysis dipstick  Rib pain on left side  Hemorrhagic cystitis - Plan: PSA, Basic metabolic panel, CBC, ciprofloxacin (CIPRO) 500 MG tablet  Elevated PSA - Plan: PSA  Hemorrhagic cystitis, history of sepsis due to urinary infection last year. No current signs or symptoms of bacteremia/sepsis.  -check PSA (hx of elevated PSAs, but rule out prostatitis), start Cipro, quinolone risks and precautions were discussed. Advised to stop glimepiride while on Cipro as previous A1c well controlled and risk of hypoglycemia.  -Push fluids, recheck in 3 days, sooner if worse. RTC/ER precautions  Meds ordered this encounter  Medications  . ciprofloxacin (CIPRO) 500 MG tablet    Sig: Take 1 tablet (500 mg total) by mouth 2 (two) times daily.    Dispense:  20 tablet     Refill:  0   Patient Instructions   Start Cipro for possible urinary tract infection. I will check a urine culture and blood count as well as kidney function. Follow-up with me on Tuesday of next week for repeat urine test and blood count. Sooner if any worsening symptoms or to emergency room.  Stop glimepiride while you are taking the Cipro as the combination could cause your blood sugar to drop too low.   Drink plenty of fluids.   Return to the clinic or go to the nearest emergency room if any of your symptoms worsen or new symptoms occur.    Urinary Tract Infection Urinary tract infections (UTIs) can develop anywhere along your urinary tract. Your urinary tract is your body's drainage system for removing wastes and extra water. Your urinary tract includes two kidneys, two ureters, a bladder, and a urethra. Your kidneys are a pair of bean-shaped organs. Each kidney is about the size of your fist. They are located below your ribs, one on each side of your spine. CAUSES Infections are caused by microbes, which are microscopic organisms, including fungi, viruses, and bacteria. These organisms are so small that they can only be seen through a microscope. Bacteria are the microbes that most commonly cause UTIs. SYMPTOMS  Symptoms of UTIs may vary by age and gender of the patient and by the location of the infection. Symptoms in young women typically include a frequent and intense urge to urinate and a painful, burning feeling in the bladder or urethra during urination. Older women and men are more likely to be tired, shaky, and weak and have muscle aches and abdominal pain. A fever may mean the infection is in your kidneys. Other symptoms of a kidney infection include pain in your back or sides below the ribs, nausea, and vomiting. DIAGNOSIS To diagnose a UTI, your caregiver will ask you about your symptoms. Your caregiver will also ask you to provide a urine sample. The urine sample will be tested  for bacteria and white blood cells. White blood cells are made by your body to help fight infection. TREATMENT  Typically, UTIs  can be treated with medication. Because most UTIs are caused by a bacterial infection, they usually can be treated with the use of antibiotics. The choice of antibiotic and length of treatment depend on your symptoms and the type of bacteria causing your infection. HOME CARE INSTRUCTIONS  If you were prescribed antibiotics, take them exactly as your caregiver instructs you. Finish the medication even if you feel better after you have only taken some of the medication.  Drink enough water and fluids to keep your urine clear or pale yellow.  Avoid caffeine, tea, and carbonated beverages. They tend to irritate your bladder.  Empty your bladder often. Avoid holding urine for long periods of time.  Empty your bladder before and after sexual intercourse.  After a bowel movement, women should cleanse from front to back. Use each tissue only once. SEEK MEDICAL CARE IF:   You have back pain.  You develop a fever.  Your symptoms do not begin to resolve within 3 days. SEEK IMMEDIATE MEDICAL CARE IF:   You have severe back pain or lower abdominal pain.  You develop chills.  You have nausea or vomiting.  You have continued burning or discomfort with urination. MAKE SURE YOU:   Understand these instructions.  Will watch your condition.  Will get help right away if you are not doing well or get worse.   This information is not intended to replace advice given to you by your health care provider. Make sure you discuss any questions you have with your health care provider.   Document Released: 07/14/2005 Document Revised: 06/25/2015 Document Reviewed: 11/12/2011 Elsevier Interactive Patient Education 2016 [employer].     IF you received an x-ray today, you will receive an invoice from [facility] Radiology. Please contact [facility] Radiology at [phone]  with questions or concerns regarding your invoice.   IF you received labwork today, you will receive an invoice from [employer]. Please contact [facility] at [phone] with questions or concerns regarding your invoice.   Our billing staff will not be able to assist you with questions regarding bills from these companies.  You will be contacted with the lab results as soon as they are available. The fastest way to get your results is to activate your My Chart account. Instructions are located on the last page of this paperwork. If you have not heard from [place] regarding the results in 2 weeks, please contact this office.        I personally performed the services described in this documentation, which was scribed in my presence. The recorded information has been reviewed and considered, and addended by me as needed.   Signed,   [name], MD [facility].  07/31/16 12:37 PM

## 2016-07-31 NOTE — Patient Instructions (Addendum)
Start Cipro for possible urinary tract infection. I will check a urine culture and blood count as well as kidney function. Follow-up with me on Tuesday of next week for repeat urine test and blood count. Sooner if any worsening symptoms or to emergency room.  Stop glimepiride while you are taking the Cipro as the combination could cause your blood sugar to drop too low.   Drink plenty of fluids.   Return to the clinic or go to the nearest emergency room if any of your symptoms worsen or new symptoms occur.    Urinary Tract Infection Urinary tract infections (UTIs) can develop anywhere along your urinary tract. Your urinary tract is your body's drainage system for removing wastes and extra water. Your urinary tract includes two kidneys, two ureters, a bladder, and a urethra. Your kidneys are a pair of bean-shaped organs. Each kidney is about the size of your fist. They are located below your ribs, one on each side of your spine. CAUSES Infections are caused by microbes, which are microscopic organisms, including fungi, viruses, and bacteria. These organisms are so small that they can only be seen through a microscope. Bacteria are the microbes that most commonly cause UTIs. SYMPTOMS  Symptoms of UTIs may vary by age and gender of the patient and by the location of the infection. Symptoms in young women typically include a frequent and intense urge to urinate and a painful, burning feeling in the bladder or urethra during urination. Older women and men are more likely to be tired, shaky, and weak and have muscle aches and abdominal pain. A fever may mean the infection is in your kidneys. Other symptoms of a kidney infection include pain in your back or sides below the ribs, nausea, and vomiting. DIAGNOSIS To diagnose a UTI, your caregiver will ask you about your symptoms. Your caregiver will also ask you to provide a urine sample. The urine sample will be tested for bacteria and white blood cells.  White blood cells are made by your body to help fight infection. TREATMENT  Typically, UTIs can be treated with medication. Because most UTIs are caused by a bacterial infection, they usually can be treated with the use of antibiotics. The choice of antibiotic and length of treatment depend on your symptoms and the type of bacteria causing your infection. HOME CARE INSTRUCTIONS  If you were prescribed antibiotics, take them exactly as your caregiver instructs you. Finish the medication even if you feel better after you have only taken some of the medication.  Drink enough water and fluids to keep your urine clear or pale yellow.  Avoid caffeine, tea, and carbonated beverages. They tend to irritate your bladder.  Empty your bladder often. Avoid holding urine for long periods of time.  Empty your bladder before and after sexual intercourse.  After a bowel movement, women should cleanse from front to back. Use each tissue only once. SEEK MEDICAL CARE IF:   You have back pain.  You develop a fever.  Your symptoms do not begin to resolve within 3 days. SEEK IMMEDIATE MEDICAL CARE IF:   You have severe back pain or lower abdominal pain.  You develop chills.  You have nausea or vomiting.  You have continued burning or discomfort with urination. MAKE SURE YOU:   Understand these instructions.  Will watch your condition.  Will get help right away if you are not doing well or get worse.   This information is not intended to replace advice given to  you by your health care provider. Make sure you discuss any questions you have with your health care provider.   Document Released: 07/14/2005 Document Revised: 06/25/2015 Document Reviewed: 11/12/2011 Elsevier Interactive Patient Education 2016 [employer].     IF you received an x-ray today, you will receive an invoice from [facility]. Please contact [facility] at [phone] with questions or concerns  regarding your invoice.   IF you received labwork today, you will receive an invoice from [employer]. Please contact [facility] at [phone] with questions or concerns regarding your invoice.   Our billing staff will not be able to assist you with questions regarding bills from these companies.  You will be contacted with the lab results as soon as they are available. The fastest way to get your results is to activate your My Chart account. Instructions are located on the last page of this paperwork. If you have not heard from [place] regarding the results in 2 weeks, please contact this office.

## 2016-08-03 ENCOUNTER — Ambulatory Visit (INDEPENDENT_AMBULATORY_CARE_PROVIDER_SITE_OTHER): Payer: Medicare Other | Admitting: Family Medicine

## 2016-08-03 VITALS — BP 132/82 | HR 73 | Temp 98.3°F | Resp 17 | Ht 66.0 in | Wt 178.0 lb

## 2016-08-03 DIAGNOSIS — N3091 Cystitis, unspecified with hematuria: Secondary | ICD-10-CM

## 2016-08-03 LAB — POCT URINALYSIS DIP (MANUAL ENTRY)
BILIRUBIN UA: NEGATIVE
BILIRUBIN UA: NEGATIVE
Glucose, UA: NEGATIVE
Nitrite, UA: NEGATIVE
PH UA: 5.5
Protein Ur, POC: 30 — AB
Spec Grav, UA: >=1.03
Urobilinogen, UA: 0.2

## 2016-08-03 LAB — POC MICROSCOPIC URINALYSIS (UMFC): Mucus: ABSENT

## 2016-08-03 LAB — URINE CULTURE

## 2016-08-03 MED ORDER — SULFAMETHOXAZOLE-TRIMETHOPRIM 800-160 MG PO TABS: 1.0000 | ORAL_TABLET | Freq: Two times a day (BID) | ORAL | 0 refills | Status: DC

## 2016-08-03 NOTE — Progress Notes (Signed)
By signing my name below, I, Mesha Guinyard, attest that this documentation has been prepared under the direction and in the presence of [name], MD.  Electronically Signed: Verlee Monte, Medical Scribe. 08/03/16. 11:24 AM.  Subjective:    Patient ID: Tony Price, male    DOB: [birth year], 75 y.o.   MRN: [id number]  HPI Chief Complaint  Patient presents with  . Follow-up    kindey stones, and needs blood tested not sure for what     HPI Comments: Tony Price is a 75 y.o. male who presents to the [facility] for kidney stone follow-up. He was seen 3 days ago for left flank pain. He was dx with repeat UTI, started on Cipro, and held his amaryl due to risk of hyoglycemia. The preliminary urine culture 100K of gram bacteria and PSA 7.3. He is followed by urology for elevated PSA; reported around 6.5 when last checked and has upcoming appt with urologist. Labs were reviewed, and prev hospital admission in 2016 was also reviewed with E. Coli as the bacteria causing infection at that time.  Pt reports urinary frequency and is complaint with Cipro. Pt hasn't been sleeping on his left flank since he lays on his right side and is unsure if his left flank still hurts. Pt blood sugar reading has been reading between 112- 115. Pt hasn't been drinking tea. Pt denies flank tenderness, dysuria, hematuria, fevers, chills, and N/V.  Patient Active Problem List   Diagnosis Date Noted  . Onychomycosis 02/25/2016  . UTI (lower urinary tract infection) 11/17/2014  . DM (diabetes mellitus), type 2 ([place]) 11/17/2014  . HTN (hypertension) 11/17/2014  . Sepsis ([place]) 11/17/2014   Past Medical History:  Diagnosis Date  . Diabetes mellitus   . Hypercholesterolemia   . Hypertension   . Kidney stones    Past Surgical History:  Procedure Laterality Date  . CATARACT EXTRACTION    . EYE SURGERY    . PROSTATE BIOPSY    . ROTATOR CUFF REPAIR     Right   No Known  Allergies Prior to Admission medications   Medication Sig Start Date End Date Taking? Authorizing Provider  amLODipine (NORVASC) 5 MG tablet TAKE 1 TABLET (5 MG TOTAL) BY MOUTH DAILY. 07/30/16  Yes [name], MD  Ascorbic Acid (VITAMIN C) 1000 MG tablet Take 1,000 mg by mouth daily.   Yes Historical Provider, MD  aspirin 81 MG chewable tablet Chew by mouth daily.   Yes Historical Provider, MD  atorvastatin (LIPITOR) 20 MG tablet TAKE 1 TABLET (20 MG TOTAL) BY MOUTH DAILY. 07/28/16  Yes [name], MD  Blood Glucose Monitoring Suppl (ONE TOUCH ULTRA SYSTEM KIT) w/Device KIT Test blood sugar once daily. Dx: E11.9 07/01/16  Yes [name], MD  ciprofloxacin (CIPRO) 500 MG tablet Take 1 tablet (500 mg total) by mouth 2 (two) times daily. 07/31/16  Yes [name], MD  glimepiride (AMARYL) 2 MG tablet TAKE 1 TABLET (2 MG TOTAL) BY MOUTH 2 (TWO) TIMES DAILY. 07/30/16  Yes [name], MD  glucose blood test strip Test blood sugar once daily. Dx: E11.9 07/01/16  Yes [name], MD  Lancets [facility] ULTRASOFT) lancets Test blood sugar once daily. Dx: E11.9 07/01/16  Yes [name], MD  quinapril (ACCUPRIL) 20 MG tablet Take 1 tablet (20 mg total) by mouth daily. 11/10/15  Yes [name], MD  acetaminophen (TYLENOL) 500 MG tablet Take 1,000 mg by  mouth every 6 (six) hours as needed for mild pain or headache.    Historical Provider, MD  carboxymethylcellulose (REFRESH PLUS) 0.5 % SOLN     Historical Provider, MD  latanoprost (XALATAN) 0.005 % ophthalmic solution Place 1 drop into both eyes at bedtime.    Historical Provider, MD   Social History   Social History  . Marital status: Divorced    Spouse name: [name]  . Number of children: [name]  . Years of education: [name]   Occupational History  . Not on file.   Social History Main Topics  . Smoking status: Former Smoker    Years: 2.00  . Smokeless tobacco: Never Used  . Alcohol use No  . Drug use: No  .  Sexual activity: Not on file   Other Topics Concern  . Not on file   Social History Narrative  . No narrative on file   Review of Systems  Constitutional: Negative for chills and fever.  Gastrointestinal: Negative for nausea and vomiting.  Genitourinary: Positive for frequency. Negative for dysuria, flank pain and hematuria.   Objective:  Physical Exam  Constitutional: He appears well-developed and well-nourished. No distress.  HENT:  Head: Normocephalic and atraumatic.  Eyes: Conjunctivae are normal.  Neck: Neck supple.  Cardiovascular: Normal rate and regular rhythm.  Exam reveals no friction rub.   No murmur heard. Pulmonary/Chest: Effort normal and breath sounds normal. No respiratory distress. He has no wheezes. He has no rales.  Abdominal: Soft. He exhibits no distension. There is no tenderness. There is no rebound and no guarding.  Neurological: He is alert.  Skin: Skin is warm and dry.  Psychiatric: He has a normal mood and affect. His behavior is normal.  Nursing note and vitals reviewed.  BP 132/82 (BP Location: Right Arm, Patient Position: Sitting, Cuff Size: Normal)   Pulse 73   Temp 98.3 F (36.8 C) (Oral)   Resp 17   Ht '5\' 6"'  (1.676 m)   Wt 178 lb (80.7 kg)   SpO2 96%   BMI 28.73 kg/m    Results for orders placed or performed in visit on 08/03/16  POCT Microscopic Urinalysis ([facility])  Result Value Ref Range   WBC,UR,HPF,POC Too numerous to count  (A) None WBC/hpf   RBC,UR,HPF,POC None None RBC/hpf   Bacteria Many (A) None, Too numerous to count   Mucus Absent Absent   Epithelial Cells, UR Per Microscopy None None, Too numerous to count cells/hpf  POCT urinalysis dipstick  Result Value Ref Range   Color, UA yellow yellow   Clarity, UA cloudy (A) clear   Glucose, UA negative negative   Bilirubin, UA negative negative   Ketones, POC UA negative negative   Spec Grav, UA >=1.030    Blood, UA trace-lysed (A) negative   pH, UA 5.5    Protein Ur, POC =30  (A) negative   Urobilinogen, UA 0.2    Nitrite, UA Negative Negative   Leukocytes, UA moderate (2+) (A) Negative    Assessment & Plan:    Tony Price is a 75 y.o. male Hemorrhagic cystitis - Plan: POCT Microscopic Urinalysis ([facility]), POCT urinalysis dipstick  - Improved symptoms, no further flank/back pain. Afebrile, and urinating without difficulty. Urine culture on initial evaluation this morning had not returned sensitivities, but on review this afternoon, E.Coli with Cipro resistance. Changed to Septra DS twice a day for 1 week as improving, but advised to follow-up after completion of antibiotic either with myself or his urologist. RTC  precautions if any worsening sooner. See lab note.   Meds ordered this encounter  Medications  . aspirin 81 MG chewable tablet    Sig: Chew by mouth daily.   Patient Instructions    Your urinary tract infection could should continue to improve with the current antibiotics. I will watch the urine culture results and if there are any changes needed I will let you know. Make sure you drink plenty of fluids, water is best. Follow-up with myself or your urologist a few days after completion of antibiotic to make sure everything has cleared up.  Return to the clinic or go to the nearest emergency room if any of your symptoms worsen or new symptoms occur.   Urinary Tract Infection Urinary tract infections (UTIs) can develop anywhere along your urinary tract. Your urinary tract is your body's drainage system for removing wastes and extra water. Your urinary tract includes two kidneys, two ureters, a bladder, and a urethra. Your kidneys are a pair of bean-shaped organs. Each kidney is about the size of your fist. They are located below your ribs, one on each side of your spine. CAUSES Infections are caused by microbes, which are microscopic organisms, including fungi, viruses, and bacteria. These organisms are so small that they can only be seen through a  microscope. Bacteria are the microbes that most commonly cause UTIs. SYMPTOMS  Symptoms of UTIs may vary by age and gender of the patient and by the location of the infection. Symptoms in young women typically include a frequent and intense urge to urinate and a painful, burning feeling in the bladder or urethra during urination. Older women and men are more likely to be tired, shaky, and weak and have muscle aches and abdominal pain. A fever may mean the infection is in your kidneys. Other symptoms of a kidney infection include pain in your back or sides below the ribs, nausea, and vomiting. DIAGNOSIS To diagnose a UTI, your caregiver will ask you about your symptoms. Your caregiver will also ask you to provide a urine sample. The urine sample will be tested for bacteria and white blood cells. White blood cells are made by your body to help fight infection. TREATMENT  Typically, UTIs can be treated with medication. Because most UTIs are caused by a bacterial infection, they usually can be treated with the use of antibiotics. The choice of antibiotic and length of treatment depend on your symptoms and the type of bacteria causing your infection. HOME CARE INSTRUCTIONS  If you were prescribed antibiotics, take them exactly as your caregiver instructs you. Finish the medication even if you feel better after you have only taken some of the medication.  Drink enough water and fluids to keep your urine clear or pale yellow.  Avoid caffeine, tea, and carbonated beverages. They tend to irritate your bladder.  Empty your bladder often. Avoid holding urine for long periods of time.  Empty your bladder before and after sexual intercourse.  After a bowel movement, women should cleanse from front to back. Use each tissue only once. SEEK MEDICAL CARE IF:   You have back pain.  You develop a fever.  Your symptoms do not begin to resolve within 3 days. SEEK IMMEDIATE MEDICAL CARE IF:   You have severe  back pain or lower abdominal pain.  You develop chills.  You have nausea or vomiting.  You have continued burning or discomfort with urination. MAKE SURE YOU:   Understand these instructions.  Will watch your condition.  Will get help right away if you are not doing well or get worse.   This information is not intended to replace advice given to you by your health care provider. Make sure you discuss any questions you have with your health care provider.   Document Released: 07/14/2005 Document Revised: 06/25/2015 Document Reviewed: 11/12/2011 Elsevier Interactive Patient Education 2016 [employer].   IF you received an x-ray today, you will receive an invoice from [facility]. Please contact [facility] at [phone] with questions or concerns regarding your invoice.   IF you received labwork today, you will receive an invoice from [employer]. Please contact [facility] at [phone] with questions or concerns regarding your invoice.   Our billing staff will not be able to assist you with questions regarding bills from these companies.  You will be contacted with the lab results as soon as they are available. The fastest way to get your results is to activate your My Chart account. Instructions are located on the last page of this paperwork. If you have not heard from [place] regarding the results in 2 weeks, please contact this office.        I personally performed the services described in this documentation, which was scribed in my presence. The recorded information has been reviewed and considered, and addended by me as needed.   Signed,   [name], MD [facility].  08/03/16 6:44 PM

## 2016-08-03 NOTE — Addendum Note (Signed)
Addended by: [name] R on: 08/03/2016 03:24 PM   Modules accepted: Orders

## 2016-08-03 NOTE — Patient Instructions (Addendum)
Your urinary tract infection could should continue to improve with the current antibiotics. I will watch the urine culture results and if there are any changes needed I will let you know. Make sure you drink plenty of fluids, water is best. Follow-up with myself or your urologist a few days after completion of antibiotic to make sure everything has cleared up.  Return to the clinic or go to the nearest emergency room if any of your symptoms worsen or new symptoms occur.   Urinary Tract Infection Urinary tract infections (UTIs) can develop anywhere along your urinary tract. Your urinary tract is your body's drainage system for removing wastes and extra water. Your urinary tract includes two kidneys, two ureters, a bladder, and a urethra. Your kidneys are a pair of bean-shaped organs. Each kidney is about the size of your fist. They are located below your ribs, one on each side of your spine. CAUSES Infections are caused by microbes, which are microscopic organisms, including fungi, viruses, and bacteria. These organisms are so small that they can only be seen through a microscope. Bacteria are the microbes that most commonly cause UTIs. SYMPTOMS  Symptoms of UTIs may vary by age and gender of the patient and by the location of the infection. Symptoms in young women typically include a frequent and intense urge to urinate and a painful, burning feeling in the bladder or urethra during urination. Older women and men are more likely to be tired, shaky, and weak and have muscle aches and abdominal pain. A fever may mean the infection is in your kidneys. Other symptoms of a kidney infection include pain in your back or sides below the ribs, nausea, and vomiting. DIAGNOSIS To diagnose a UTI, your caregiver will ask you about your symptoms. Your caregiver will also ask you to provide a urine sample. The urine sample will be tested for bacteria and white blood cells. White blood cells are made by your body to  help fight infection. TREATMENT  Typically, UTIs can be treated with medication. Because most UTIs are caused by a bacterial infection, they usually can be treated with the use of antibiotics. The choice of antibiotic and length of treatment depend on your symptoms and the type of bacteria causing your infection. HOME CARE INSTRUCTIONS  If you were prescribed antibiotics, take them exactly as your caregiver instructs you. Finish the medication even if you feel better after you have only taken some of the medication.  Drink enough water and fluids to keep your urine clear or pale yellow.  Avoid caffeine, tea, and carbonated beverages. They tend to irritate your bladder.  Empty your bladder often. Avoid holding urine for long periods of time.  Empty your bladder before and after sexual intercourse.  After a bowel movement, women should cleanse from front to back. Use each tissue only once. SEEK MEDICAL CARE IF:   You have back pain.  You develop a fever.  Your symptoms do not begin to resolve within 3 days. SEEK IMMEDIATE MEDICAL CARE IF:   You have severe back pain or lower abdominal pain.  You develop chills.  You have nausea or vomiting.  You have continued burning or discomfort with urination. MAKE SURE YOU:   Understand these instructions.  Will watch your condition.  Will get help right away if you are not doing well or get worse.   This information is not intended to replace advice given to you by your health care provider. Make sure you discuss any questions  you have with your health care provider.   Document Released: 07/14/2005 Document Revised: 06/25/2015 Document Reviewed: 11/12/2011 Elsevier Interactive Patient Education 2016 [employer].   IF you received an x-ray today, you will receive an invoice from [facility]. Please contact [facility] at [phone] with questions or concerns regarding your invoice.   IF you received labwork  today, you will receive an invoice from [employer]. Please contact [facility] at [phone] with questions or concerns regarding your invoice.   Our billing staff will not be able to assist you with questions regarding bills from these companies.  You will be contacted with the lab results as soon as they are available. The fastest way to get your results is to activate your My Chart account. Instructions are located on the last page of this paperwork. If you have not heard from [place] regarding the results in 2 weeks, please contact this office.

## 2016-08-10 ENCOUNTER — Telehealth: Payer: Self-pay | Admitting: Family Medicine

## 2016-08-10 ENCOUNTER — Ambulatory Visit (INDEPENDENT_AMBULATORY_CARE_PROVIDER_SITE_OTHER): Payer: Medicare Other | Admitting: Family Medicine

## 2016-08-10 ENCOUNTER — Telehealth: Payer: Self-pay

## 2016-08-10 VITALS — BP 130/72 | HR 89 | Temp 100.4°F | Resp 18 | Ht 66.0 in | Wt 174.0 lb

## 2016-08-10 DIAGNOSIS — N3091 Cystitis, unspecified with hematuria: Secondary | ICD-10-CM

## 2016-08-10 DIAGNOSIS — J310 Chronic rhinitis: Secondary | ICD-10-CM | POA: Diagnosis not present

## 2016-08-10 DIAGNOSIS — R351 Nocturia: Secondary | ICD-10-CM

## 2016-08-10 DIAGNOSIS — R972 Elevated prostate specific antigen [PSA]: Secondary | ICD-10-CM | POA: Diagnosis not present

## 2016-08-10 LAB — POCT CBC
GRANULOCYTE PERCENT: 71.8 % (ref 37–80)
HCT, POC: 34.8 % — AB (ref 43.5–53.7)
Hemoglobin: 11.8 g/dL — AB (ref 14.1–18.1)
Lymph, poc: 2.2 (ref 0.6–3.4)
MCH: 27 pg (ref 27–31.2)
MCHC: 34 g/dL (ref 31.8–35.4)
MCV: 79.3 fL — AB (ref 80–97)
MID (cbc): 0.4 (ref 0–0.9)
MPV: 7.3 fL (ref 0–99.8)
POC GRANULOCYTE: 6.7 (ref 2–6.9)
POC LYMPH PERCENT: 23.7 %L (ref 10–50)
POC MID %: 4.5 % (ref 0–12)
Platelet Count, POC: 209 10*3/uL (ref 142–424)
RBC: 4.39 M/uL — AB (ref 4.69–6.13)
RDW, POC: 14.5 %
WBC: 9.4 10*3/uL (ref 4.6–10.2)

## 2016-08-10 LAB — POC MICROSCOPIC URINALYSIS (UMFC): Mucus: ABSENT

## 2016-08-10 LAB — POCT URINALYSIS DIP (MANUAL ENTRY)
BILIRUBIN UA: NEGATIVE
BILIRUBIN UA: NEGATIVE
GLUCOSE UA: NEGATIVE
Nitrite, UA: NEGATIVE
Protein Ur, POC: NEGATIVE
SPEC GRAV UA: 1.025
Urobilinogen, UA: 0.2
pH, UA: 5.5

## 2016-08-10 LAB — PSA: PSA: 21.9 ng/mL — ABNORMAL HIGH (ref <=4.0)

## 2016-08-10 MED ORDER — IPRATROPIUM BROMIDE 0.06 % NA SOLN: 1.0000 | Freq: Three times a day (TID) | NASAL | 1 refills | Status: DC | PRN

## 2016-08-10 MED ORDER — SULFAMETHOXAZOLE-TRIMETHOPRIM 800-160 MG PO TABS: 1.0000 | ORAL_TABLET | Freq: Two times a day (BID) | ORAL | 0 refills | Status: DC

## 2016-08-10 NOTE — Telephone Encounter (Signed)
Patient called back to question if septra was called in and it was 08/03/16. I spoke with daughter and she denies him having fever, pain or n/v up to this point but does state today he states he is very fatigued. I advised an ov now to re evaluate.will come in now. Septra called back into pharmacy.

## 2016-08-10 NOTE — Progress Notes (Addendum)
By signing my name below, I, Mesha Guinyard, attest that this documentation has been prepared under the direction and in the presence of [name], MD.  Electronically Signed: Verlee Monte, Medical Scribe. 08/10/16. 4:11 PM.  Subjective:    Patient ID: Tony Price, male    DOB: 1941-06-29, 75 y.o.   MRN: [id number]  HPI  Chief Complaint  Patient presents with  . Medication Problem    regarding antibotics    HPI Comments: Tony Price is a 75 y.o. male who presents to the [facility] for UTI follow-up. He was seen Oct 14th with left flank pain, too numerous to count white blood cells, positive nitrites and LE dx with hemorrhagic cystitis. Started on Cipro 500 mg BID to cover for possible protatisitis. PSA was 7.3, prev to be 6.5 when followed by urologist. His urine culture was positive for E. Coli, but multiple resistance including Cipro. Septra was called in, double strength BID for 7 days; this was picked up on the 18th. When staff called to check status today, he reported fatigue so asked to return for evaluation.  Pt woke up feeling tired today. Pt initially started Cipro and stopped taking it on the 18th when he switched to Septra BID, in the morning and at night. Pt was called 2 days ago from this office as well as the [facility]. Pt reports a little soreness on his left flank. Pt has been drinking the same amount of water but has urinary frequency in the day and nocturia 3-4x a night for the past week when he normally urinates 2x a night. Pt denies fever, abdominal pain, N/V, dysuria, hematuria, trouble urinating.  Nasal Congestion: Pt reports occasional nasal congestion and would like to get a refill on unknown nasal spray. Pt has not had any recent congestion.  Patient Active Problem List   Diagnosis Date Noted  . Onychomycosis 02/25/2016  . UTI (lower urinary tract infection) 11/17/2014  . DM (diabetes mellitus), type 2 ([place]) 11/17/2014  . HTN  (hypertension) 11/17/2014  . Sepsis ([place]) 11/17/2014   Past Medical History:  Diagnosis Date  . Diabetes mellitus   . Hypercholesterolemia   . Hypertension   . Kidney stones    Past Surgical History:  Procedure Laterality Date  . CATARACT EXTRACTION    . EYE SURGERY    . PROSTATE BIOPSY    . ROTATOR CUFF REPAIR     Right   No Known Allergies Prior to Admission medications   Medication Sig Start Date End Date Taking? Authorizing Provider  acetaminophen (TYLENOL) 500 MG tablet Take 1,000 mg by mouth every 6 (six) hours as needed for mild pain or headache.   Yes Historical Provider, MD  amLODipine (NORVASC) 5 MG tablet TAKE 1 TABLET (5 MG TOTAL) BY MOUTH DAILY. 07/30/16  Yes [name], MD  Ascorbic Acid (VITAMIN C) 1000 MG tablet Take 1,000 mg by mouth daily.   Yes Historical Provider, MD  aspirin 81 MG chewable tablet Chew by mouth daily.   Yes Historical Provider, MD  atorvastatin (LIPITOR) 20 MG tablet TAKE 1 TABLET (20 MG TOTAL) BY MOUTH DAILY. 07/28/16  Yes [name], MD  Blood Glucose Monitoring Suppl (ONE TOUCH ULTRA SYSTEM KIT) w/Device KIT Test blood sugar once daily. Dx: E11.9 07/01/16  Yes [name], MD  carboxymethylcellulose (REFRESH PLUS) 0.5 % SOLN    Yes Historical Provider, MD  glimepiride (AMARYL) 2 MG tablet TAKE 1 TABLET (2 MG TOTAL) BY  MOUTH 2 (TWO) TIMES DAILY. 07/30/16  Yes [name], MD  glucose blood test strip Test blood sugar once daily. Dx: E11.9 07/01/16  Yes [name], MD  Lancets [facility] ULTRASOFT) lancets Test blood sugar once daily. Dx: E11.9 07/01/16  Yes [name], MD  latanoprost (XALATAN) 0.005 % ophthalmic solution Place 1 drop into both eyes at bedtime.   Yes Historical Provider, MD  quinapril (ACCUPRIL) 20 MG tablet Take 1 tablet (20 mg total) by mouth daily. 11/10/15  Yes [name], MD  sulfamethoxazole-trimethoprim (BACTRIM DS,SEPTRA DS) 800-160 MG tablet Take 1 tablet by mouth 2 (two) times daily.  08/03/16   [name], MD   Social History   Social History  . Marital status: Divorced    Spouse name: [name]  . Number of children: [name]  . Years of education: [name]   Occupational History  . Not on file.   Social History Main Topics  . Smoking status: Former Smoker    Years: 2.00  . Smokeless tobacco: Never Used  . Alcohol use No  . Drug use: No  . Sexual activity: Not on file   Other Topics Concern  . Not on file   Social History Narrative  . No narrative on file   Review of Systems  Constitutional: Positive for fatigue. Negative for fever.  HENT: Negative for congestion.   Gastrointestinal: Negative for abdominal pain, nausea and vomiting.  Genitourinary: Positive for frequency. Negative for difficulty urinating, dysuria, flank pain (reports some soreness) and hematuria.   Objective:  Physical Exam  Constitutional: He is oriented to person, place, and time. He appears well-developed and well-nourished. No distress.  HENT:  Head: Normocephalic and atraumatic.  Right Ear: Tympanic membrane, external ear and ear canal normal.  Left Ear: Tympanic membrane, external ear and ear canal normal.  Nose: No rhinorrhea.  Mouth/Throat: Oropharynx is clear and moist and mucous membranes are normal. No oropharyngeal exudate or posterior oropharyngeal erythema.  Minimal edema on the turbinates  Eyes: Conjunctivae are normal. Pupils are equal, round, and reactive to light.  Neck: Neck supple.  Cardiovascular: Normal rate, regular rhythm, normal heart sounds and intact distal pulses.  Exam reveals no gallop and no friction rub.   No murmur heard. Pulmonary/Chest: Effort normal and breath sounds normal. No respiratory distress. He has no wheezes. He has no rhonchi. He has no rales.  Abdominal: Soft. He exhibits no distension. There is no tenderness. There is no CVA tenderness.  Lymphadenopathy:    He has no cervical adenopathy.  Neurological: He is alert and oriented to person,  place, and time.  Skin: Skin is warm and dry. No rash noted.  Psychiatric: He has a normal mood and affect. His behavior is normal.  Nursing note and vitals reviewed.  BP 130/72 (BP Location: Right Arm, Patient Position: Sitting, Cuff Size: Small)   Pulse 89   Temp (!) 100.4 F (38 C) (Oral)   Resp 18   Ht 5' 6" (1.676 m)   Wt 174 lb (78.9 kg)   SpO2 97%   BMI 28.08 kg/m    Results for orders placed or performed in visit on 08/10/16  POCT CBC  Result Value Ref Range   WBC 9.4 4.6 - 10.2 K/uL   Lymph, poc 2.2 0.6 - 3.4   POC LYMPH PERCENT 23.7 10 - 50 %L   MID (cbc) 0.4 0 - 0.9   POC MID % 4.5 0 - 12 %M   POC  Granulocyte 6.7 2 - 6.9   Granulocyte percent 71.8 37 - 80 %G   RBC 4.39 (A) 4.69 - 6.13 M/uL   Hemoglobin 11.8 (A) 14.1 - 18.1 g/dL   HCT, POC 34.8 (A) 43.5 - 53.7 %   MCV 79.3 (A) 80 - 97 fL   MCH, POC 27.0 27 - 31.2 pg   MCHC 34.0 31.8 - 35.4 g/dL   RDW, POC 14.5 %   Platelet Count, POC 209 142 - 424 K/uL   MPV 7.3 0 - 99.8 fL  POCT urinalysis dipstick  Result Value Ref Range   Color, UA yellow yellow   Clarity, UA clear clear   Glucose, UA negative negative   Bilirubin, UA negative negative   Ketones, POC UA negative negative   Spec Grav, UA 1.025    Blood, UA trace-intact (A) negative   pH, UA 5.5    Protein Ur, POC negative negative   Urobilinogen, UA 0.2    Nitrite, UA Negative Negative   Leukocytes, UA moderate (2+) (A) Negative  POCT Microscopic Urinalysis ([facility])  Result Value Ref Range   WBC,UR,HPF,POC None None WBC/hpf   RBC,UR,HPF,POC None None RBC/hpf   Bacteria Few (A) None, Too numerous to count   Mucus Absent Absent   Epithelial Cells, UR Per Microscopy None None, Too numerous to count cells/hpf   Cholesterol Crys, UA moderate     Assessment & Plan:   Tony Price is a 75 y.o. male Hemorrhagic cystitis - Plan: POCT CBC, POCT urinalysis dipstick, POCT Microscopic Urinalysis ([facility]), PSA, Urine culture, sulfamethoxazole-trimethoprim  (BACTRIM DS,SEPTRA DS) 800-160 MG tablet Elevated PSA - Plan: PSA, Urine culture Nocturia - Plan: PSA, Urine culture  - Improved results on urinalysis, but with prior resistant Escherichia coli to Cipro, will continue Septra for additional 1 week as slight bump in PSA, could have component of prostatitis with this infection. Borderline temp in office, but reassuring CBC. RTC precautions of fever or any worsening symptoms.  Rhinitis, unspecified chronicity, unspecified type  - Trial of Atrovent nasal spray when necessary. May be component of allergic versus vasomotor rhinitis, episodic. RTC precautions  Meds ordered this encounter  Medications  . ipratropium (ATROVENT) 0.06 % nasal spray    Sig: Place 1 spray into both nostrils 3 (three) times daily as needed for rhinitis.    Dispense:  15 mL    Refill:  1  . sulfamethoxazole-trimethoprim (BACTRIM DS,SEPTRA DS) 800-160 MG tablet    Sig: Take 1 tablet by mouth 2 (two) times daily.    Dispense:  14 tablet    Refill:  0   Patient Instructions   Your temperature was borderline elevated here in the office, but your blood count appears to be okay. The urine test does show some possible small amount of remaining infection. Continue Septra for 1 additional week and I will recheck the urine culture and prostate test today. Follow-up with me as scheduled on the 30th, sooner if any fevers, worsening fatigue, abdominal pain pain, nausea or vomiting.   For nasal congestion, I did prescribe some Atrovent nasal spray to use as needed. If you do have to use that medicine daily or more frequent use, return to discuss other treatments.  Allergic Rhinitis Allergic rhinitis is when the mucous membranes in the nose respond to allergens. Allergens are particles in the air that cause your body to have an allergic reaction. This causes you to release allergic antibodies. Through a chain of events, these eventually cause you to release  histamine into the blood  stream. Although meant to protect the body, it is this release of histamine that causes your discomfort, such as frequent sneezing, congestion, and an itchy, runny nose.  CAUSES Seasonal allergic rhinitis (hay fever) is caused by pollen allergens that may come from grasses, trees, and weeds. Year-round allergic rhinitis (perennial allergic rhinitis) is caused by allergens such as house dust mites, pet dander, and mold spores. SYMPTOMS  Nasal stuffiness (congestion).  Itchy, runny nose with sneezing and tearing of the eyes. DIAGNOSIS Your health care provider can help you determine the allergen or allergens that trigger your symptoms. If you and your health care provider are unable to determine the allergen, skin or blood testing may be used. Your health care provider will diagnose your condition after taking your health history and performing a physical exam. Your health care provider may assess you for other related conditions, such as asthma, pink eye, or an ear infection. TREATMENT Allergic rhinitis does not have a cure, but it can be controlled by:  Medicines that block allergy symptoms. These may include allergy shots, nasal sprays, and oral antihistamines.  Avoiding the allergen. Hay fever may often be treated with antihistamines in pill or nasal spray forms. Antihistamines block the effects of histamine. There are over-the-counter medicines that may help with nasal congestion and swelling around the eyes. Check with your health care provider before taking or giving this medicine. If avoiding the allergen or the medicine prescribed do not work, there are many new medicines your health care provider can prescribe. Stronger medicine may be used if initial measures are ineffective. Desensitizing injections can be used if medicine and avoidance does not work. Desensitization is when a patient is given ongoing shots until the body becomes less sensitive to the allergen. Make sure you follow up with  your health care provider if problems continue. HOME CARE INSTRUCTIONS It is not possible to completely avoid allergens, but you can reduce your symptoms by taking steps to limit your exposure to them. It helps to know exactly what you are allergic to so that you can avoid your specific triggers. SEEK MEDICAL CARE IF:  You have a fever.  You develop a cough that does not stop easily (persistent).  You have shortness of breath.  You start wheezing.  Symptoms interfere with normal daily activities.   This information is not intended to replace advice given to you by your health care provider. Make sure you discuss any questions you have with your health care provider.   Document Released: 06/29/2001 Document Revised: 10/25/2014 Document Reviewed: 06/11/2013 Elsevier Interactive Patient Education 2016 [employer].    IF you received an x-ray today, you will receive an invoice from [facility]. Please contact [facility] at [phone] with questions or concerns regarding your invoice.   IF you received labwork today, you will receive an invoice from [employer]. Please contact [facility] at [phone] with questions or concerns regarding your invoice.   Our billing staff will not be able to assist you with questions regarding bills from these companies.  You will be contacted with the lab results as soon as they are available. The fastest way to get your results is to activate your My Chart account. Instructions are located on the last page of this paperwork. If you have not heard from [place] regarding the results in 2 weeks, please contact this office.       I personally performed the services described in this documentation, which  was scribed in my presence. The recorded information has been reviewed and considered, and addended by me as needed.   Signed,   [name], MD [facility].    08/10/16 5:35 PM

## 2016-08-10 NOTE — Progress Notes (Signed)
Pt. Advised of culture result and new rx to start. Will reschedule f/u appt. For 08/17/16

## 2016-08-10 NOTE — Telephone Encounter (Signed)
Pt stating that his medicine wasn't sent to pharmacy when he went to go pick it up I advised him that it usually takes about an hour and the pharmacy don't check message but every hour pt understands

## 2016-08-10 NOTE — Patient Instructions (Addendum)
Your temperature was borderline elevated here in the office, but your blood count appears to be okay. The urine test does show some possible small amount of remaining infection. Continue Septra for 1 additional week and I will recheck the urine culture and prostate test today. Follow-up with me as scheduled on the 30th, sooner if any fevers, worsening fatigue, abdominal pain pain, nausea or vomiting.   For nasal congestion, I did prescribe some Atrovent nasal spray to use as needed. If you do have to use that medicine daily or more frequent use, return to discuss other treatments.  Allergic Rhinitis Allergic rhinitis is when the mucous membranes in the nose respond to allergens. Allergens are particles in the air that cause your body to have an allergic reaction. This causes you to release allergic antibodies. Through a chain of events, these eventually cause you to release histamine into the blood stream. Although meant to protect the body, it is this release of histamine that causes your discomfort, such as frequent sneezing, congestion, and an itchy, runny nose.  CAUSES Seasonal allergic rhinitis (hay fever) is caused by pollen allergens that may come from grasses, trees, and weeds. Year-round allergic rhinitis (perennial allergic rhinitis) is caused by allergens such as house dust mites, pet dander, and mold spores. SYMPTOMS  Nasal stuffiness (congestion).  Itchy, runny nose with sneezing and tearing of the eyes. DIAGNOSIS Your health care provider can help you determine the allergen or allergens that trigger your symptoms. If you and your health care provider are unable to determine the allergen, skin or blood testing may be used. Your health care provider will diagnose your condition after taking your health history and performing a physical exam. Your health care provider may assess you for other related conditions, such as asthma, pink eye, or an ear infection. TREATMENT Allergic rhinitis  does not have a cure, but it can be controlled by:  Medicines that block allergy symptoms. These may include allergy shots, nasal sprays, and oral antihistamines.  Avoiding the allergen. Hay fever may often be treated with antihistamines in pill or nasal spray forms. Antihistamines block the effects of histamine. There are over-the-counter medicines that may help with nasal congestion and swelling around the eyes. Check with your health care provider before taking or giving this medicine. If avoiding the allergen or the medicine prescribed do not work, there are many new medicines your health care provider can prescribe. Stronger medicine may be used if initial measures are ineffective. Desensitizing injections can be used if medicine and avoidance does not work. Desensitization is when a patient is given ongoing shots until the body becomes less sensitive to the allergen. Make sure you follow up with your health care provider if problems continue. HOME CARE INSTRUCTIONS It is not possible to completely avoid allergens, but you can reduce your symptoms by taking steps to limit your exposure to them. It helps to know exactly what you are allergic to so that you can avoid your specific triggers. SEEK MEDICAL CARE IF:  You have a fever.  You develop a cough that does not stop easily (persistent).  You have shortness of breath.  You start wheezing.  Symptoms interfere with normal daily activities.   This information is not intended to replace advice given to you by your health care provider. Make sure you discuss any questions you have with your health care provider.   Document Released: 06/29/2001 Document Revised: 10/25/2014 Document Reviewed: 06/11/2013 Elsevier Interactive Patient Education [employer].  IF you received an x-ray today, you will receive an invoice from [facility]. Please contact [facility] at [phone] with questions or concerns regarding  your invoice.   IF you received labwork today, you will receive an invoice from [employer]. Please contact [facility] at [phone] with questions or concerns regarding your invoice.   Our billing staff will not be able to assist you with questions regarding bills from these companies.  You will be contacted with the lab results as soon as they are available. The fastest way to get your results is to activate your My Chart account. Instructions are located on the last page of this paperwork. If you have not heard from [place] regarding the results in 2 weeks, please contact this office.

## 2016-08-10 NOTE — Telephone Encounter (Signed)
Pt. Advised of urine culture results and change in antibiotics , will follow up with Dr Carlota Raspberry 08/17/16 instead of 10/26 when new antibiotics are done.

## 2016-08-10 NOTE — Telephone Encounter (Signed)
-----   Message from [name], MD sent at 08/03/2016  3:24 PM EDT ----- Call pt.  his urine culture did come back, and unfortunately infection is resistant to Cipro. As long as he has not had problems with sulfa medications in the past, Septra does appear to be sensitive. I will send in Septra one pill twice per day for the next 1 week then he should follow-up with me for repeat urine testing.  Other results were discussed today in office

## 2016-08-11 LAB — URINE CULTURE

## 2016-08-12 ENCOUNTER — Ambulatory Visit: Payer: Medicare Other | Admitting: Family Medicine

## 2016-08-14 ENCOUNTER — Ambulatory Visit (INDEPENDENT_AMBULATORY_CARE_PROVIDER_SITE_OTHER): Payer: Medicare Other | Admitting: Urgent Care

## 2016-08-14 VITALS — BP 118/70 | HR 80 | Temp 100.6°F | Resp 16 | Ht 66.0 in | Wt 172.0 lb

## 2016-08-14 DIAGNOSIS — H578 Other specified disorders of eye and adnexa: Secondary | ICD-10-CM

## 2016-08-14 DIAGNOSIS — N3091 Cystitis, unspecified with hematuria: Secondary | ICD-10-CM | POA: Diagnosis not present

## 2016-08-14 DIAGNOSIS — H5789 Other specified disorders of eye and adnexa: Secondary | ICD-10-CM

## 2016-08-14 DIAGNOSIS — R972 Elevated prostate specific antigen [PSA]: Secondary | ICD-10-CM

## 2016-08-14 LAB — POCT URINALYSIS DIP (MANUAL ENTRY)
Bilirubin, UA: NEGATIVE
Blood, UA: NEGATIVE
GLUCOSE UA: NEGATIVE
Ketones, POC UA: NEGATIVE
NITRITE UA: NEGATIVE
UROBILINOGEN UA: 1
pH, UA: 6

## 2016-08-14 LAB — POCT CBC
Granulocyte percent: 69.9 %G (ref 37–80)
HEMATOCRIT: 35.9 % — AB (ref 43.5–53.7)
HEMOGLOBIN: 12 g/dL — AB (ref 14.1–18.1)
Lymph, poc: 1.7 (ref 0.6–3.4)
MCH: 26.4 pg — AB (ref 27–31.2)
MCHC: 33.4 g/dL (ref 31.8–35.4)
MCV: 79 fL — AB (ref 80–97)
MID (CBC): 0.4 (ref 0–0.9)
MPV: 7.3 fL (ref 0–99.8)
POC GRANULOCYTE: 5 (ref 2–6.9)
POC LYMPH PERCENT: 24.3 %L (ref 10–50)
POC MID %: 5.8 % (ref 0–12)
Platelet Count, POC: 287 10*3/uL (ref 142–424)
RBC: 4.54 M/uL — AB (ref 4.69–6.13)
RDW, POC: 14.4 %
WBC: 7.1 10*3/uL (ref 4.6–10.2)

## 2016-08-14 LAB — BASIC METABOLIC PANEL
BUN: 15 mg/dL (ref 7–25)
CALCIUM: 9.1 mg/dL (ref 8.6–10.3)
CHLORIDE: 105 mmol/L (ref 98–110)
CO2: 20 mmol/L (ref 20–31)
CREATININE: 1.39 mg/dL — AB (ref 0.70–1.18)
Glucose, Bld: 99 mg/dL (ref 65–99)
Potassium: 4.9 mmol/L (ref 3.5–5.3)
Sodium: 136 mmol/L (ref 135–146)

## 2016-08-14 LAB — POC MICROSCOPIC URINALYSIS (UMFC): Mucus: ABSENT

## 2016-08-14 MED ORDER — ERYTHROMYCIN 5 MG/GM OP OINT: 1.0000 "application " | TOPICAL_OINTMENT | Freq: Every day | OPHTHALMIC | 0 refills | Status: DC

## 2016-08-14 NOTE — Patient Instructions (Addendum)
Erythromycin eye ointment What is this medicine? ERYTHROMYCIN (er ith roe MYE sin) is a macrolide antibiotic. It is used to treat bacterial eye infections. It also prevents a certain type of eye infection that can occur in some babies. This medicine may be used for other purposes; ask your health care provider or pharmacist if you have questions. What should I tell my health care provider before I take this medicine? -if you have an unusual or allergic reaction to erythromycin, foods, dyes, or preservatives -pregnant or trying to get pregnant -breast-feeding How should I use this medicine? This medicine is only for use in the eye. Follow the directions on the prescription label. Wash hands before and after use. Tilt your head back slightly and pull your lower eyelid down with your index finger to form a pouch. Try not to touch the tip of the tube, to your eye, fingertips, or any other surface. Squeeze the end of the tube to apply a thin layer of the ointment to the inside of the lower eyelid. Close the eye gently to spread the ointment. Your vision may blur for a few minutes. Use your doses at regular intervals. Do not use your medicine more often than directed. Finish the full course prescribed by your doctor or health care professional even if you think your condition is better. Do not stop using except on the advice of your doctor or health care professional. Talk to your pediatrician regarding the use of this medicine in children. Special care may be needed. Overdosage: If you think you have taken too much of this medicine contact a poison control center or emergency room at once. NOTE: This medicine is only for you. Do not share this medicine with others. What if I miss a dose? If you miss a dose, use it as soon as you can. If it is almost time for your next dose, use only that dose. Do not use double or extra doses. What may interact with this medicine? Interactions are not expected. Do not use  any other eye products without telling your doctor or health care professional. This list may not describe all possible interactions. Give your health care provider a list of all the medicines, herbs, non-prescription drugs, or dietary supplements you use. Also tell them if you smoke, drink alcohol, or use illegal drugs. Some items may interact with your medicine. What should I watch for while using this medicine? Tell your doctor or health care professional if your symptoms do not improve in 2 to 3 days. What side effects may I notice from receiving this medicine? Side effects that you should report to your doctor or health care professional as soon as possible: -allergic reactions like skin rash, itching or hives, swelling of the face, lips, or tongue -burning, stinging, or itching of the eyes or eyelids -changes in vision -redness, swelling, or pain This list may not describe all possible side effects. Call your doctor for medical advice about side effects. You may report side effects to FDA at 1-800-FDA-1088. Where should I keep my medicine? Keep out of the reach of children. Store at room temperature between 15 and 30 degrees C (59 and 86 degrees F). Do not freeze. Throw away any unused ointment after the expiration date. NOTE: This sheet is a summary. It may not cover all possible information. If you have questions about this medicine, talk to your doctor, pharmacist, or health care provider.    2016, Elsevier/Gold Standard. (2008-02-05 17:17:39)  IF you received an x-ray today, you will receive an invoice from [facility]. Please contact [facility] at [phone] with questions or concerns regarding your invoice.   IF you received labwork today, you will receive an invoice from [employer]. Please contact [facility] at [phone] with questions or concerns regarding your invoice.   Our billing staff will not be able to assist you with  questions regarding bills from these companies.  You will be contacted with the lab results as soon as they are available. The fastest way to get your results is to activate your My Chart account. Instructions are located on the last page of this paperwork. If you have not heard from [place] regarding the results in 2 weeks, please contact this office.

## 2016-08-14 NOTE — Progress Notes (Addendum)
Patient ID: Tony Price, male   DOB: August 24, 1941, 75 y.o.   MRN: [id number]  By signing my name below I, Tony Price, attest that this documentation has been prepared under the direction and in the presence of Tony Price, [place]. Electonically Signed. Tony Price, Scribe 08/14/2016 at 3:43 PM   MRN: [id number] DOB: [birth year]  Subjective:   Tony Price is a 75 y.o. male presenting for follow up on UTI. Pt initially seen on 07/31/16 and started on 534m Cipro BID, then switched to Septra on 08/04/16 due to urine culture showing resistance to Cipro. Pt had follow up visit on 08/10/16 where pt was reporting that he was taking the Septra faithfully and was c/o fatigue, urinary frequency, and increased nocturia. Also noted that pt had an elevated PSA. Planned on continuing pt on Septra BID due to concern for prostatitis with UTI and follow up on 08/16/16. Pt came in early due to new concern about left eye, see below.  Urinary frequency - Today, reports fever and urinary frequency with reduced output. Fever improved with tylenol. Continues to hydrate very well. Denies dysuria, urinary urgency or incontinence, penile discharge or rash, abd pain, or N/V. Reports compliance with Septra.   Left eye redness - Reports eye redness in left eye for the past 2 days that is worse in the morning and improves throughout the day. Feels tender over his upper eyelid, admits that he keeps rubbing his eye. Denies eye drainage, sinus pain, congestion, ear pain, sore throat. Used Atrovent once in the past week.   Tony Price has a current medication list which includes the following prescription(s): acetaminophen, amlodipine, vitamin c, aspirin, atorvastatin, one touch ultra system kit, carboxymethylcellulose, glimepiride, glucose blood, ipratropium, onetouch ultrasoft, latanoprost, quinapril, and sulfamethoxazole-trimethoprim. Also has No Known Allergies.  Tony Price  has a past medical history of Diabetes mellitus;  Hypercholesterolemia; Hypertension; and Kidney stones. Also  has a past surgical history that includes Eye surgery; Rotator cuff repair; ostate biopsy; and Cataract extraction.  Objective:   Vitals: BP 118/70    Pulse 80    Temp (!) 100.6 F (38.1 C) (Oral)    Resp 16    Ht _0  (1.676 m)    Wt 172 lb (78 kg)    SpO2 98%    BMI 27.76 kg/m   Physical Exam  Constitutional: He is oriented to person, place, and time. He appears well-developed and well-nourished.  HENT:  TM's intact bilaterally, no effusions or erythema. Nasal turbinates pink and moist, nasal passages patent. No sinus tenderness. Oropharynx clear, mucous membranes moist, dentition in good repair.  Eyes: EOM are normal. Pupils are equal, round, and reactive to light. Right conjunctiva is not injected. Left conjunctiva is injected (with clear drainage).  Cardiovascular: Normal rate, regular rhythm and intact distal pulses.  Exam reveals no gallop and no friction rub.   No murmur heard. Pulmonary/Chest: No respiratory distress. He has no wheezes. He has no rales.  Abdominal: Soft. Normal appearance and bowel sounds are normal. He exhibits no distension and no mass. There is no tenderness.  No CVA tenderness.  Lymphadenopathy:    He has no cervical adenopathy.  Neurological: He is alert and oriented to person, place, and time.   Results for orders placed or performed in visit on 08/14/16 (from the past 24 hour(s))  POCT Microscopic Urinalysis ([facility])     Status: Abnormal   Collection Time: 08/14/16  3:42 PM  Result Value Ref Range   WBC,UR,HPF,POC Few (A) None WBC/hpf  RBC,UR,HPF,POC Few (A) None RBC/hpf   Bacteria None None, Too numerous to count   Mucus Absent Absent   Epithelial Cells, UR Per Microscopy Many (A) None, Too numerous to count cells/hpf  POCT urinalysis dipstick     Status: Abnormal   Collection Time: 08/14/16  3:42 PM  Result Value Ref Range   Color, UA yellow yellow   Clarity, UA clear clear    Glucose, UA negative negative   Bilirubin, UA negative negative   Ketones, POC UA negative negative   Spec Grav, UA >=1.030    Blood, UA negative negative   pH, UA 6.0    Protein Ur, POC =30 (A) negative   Urobilinogen, UA 1.0    Nitrite, UA Negative Negative   Leukocytes, UA Trace (A) Negative  POCT CBC     Status: Abnormal   Collection Time: 08/14/16  3:42 PM  Result Value Ref Range   WBC 7.1 4.6 - 10.2 K/uL   Lymph, poc 1.7 0.6 - 3.4   POC LYMPH PERCENT 24.3 10 - 50 %L   MID (cbc) 0.4 0 - 0.9   POC MID % 5.8 0 - 12 %M   POC Granulocyte 5.0 2 - 6.9   Granulocyte percent 69.9 37 - 80 %G   RBC 4.54 (A) 4.69 - 6.13 M/uL   Hemoglobin 12.0 (A) 14.1 - 18.1 g/dL   HCT, POC 35.9 (A) 43.5 - 53.7 %   MCV 79.0 (A) 80 - 97 fL   MCH, POC 26.4 (A) 27 - 31.2 pg   MCHC 33.4 31.8 - 35.4 g/dL   RDW, POC 14.4 %   Platelet Count, POC 287 142 - 424 K/uL   MPV 7.3 0 - 99.8 fL   Assessment and Plan :   1. Hemorrhagic cystitis 2. Elevated PSA - Improved, emphasized aggressive hydration which the patient is not doing. Recheck if symptoms do not fully resolve.  3. Redness and discharge of eye - Will cover for bacterial conjunctivitis, rtc 08/16/2016 if no improvement.  Tony Eagles, PA-C [facility] [phone] 08/14/2016 3:42 PM

## 2016-08-16 ENCOUNTER — Encounter: Payer: Self-pay | Admitting: *Deleted

## 2016-08-16 LAB — URINE CULTURE: ORGANISM ID, BACTERIA: NO GROWTH

## 2016-08-17 ENCOUNTER — Ambulatory Visit: Payer: Medicare Other

## 2016-08-20 ENCOUNTER — Ambulatory Visit (INDEPENDENT_AMBULATORY_CARE_PROVIDER_SITE_OTHER): Payer: Medicare Other | Admitting: Family Medicine

## 2016-08-20 VITALS — BP 128/70 | HR 66 | Temp 98.5°F | Resp 18 | Ht 66.0 in | Wt 171.0 lb

## 2016-08-20 DIAGNOSIS — H109 Unspecified conjunctivitis: Secondary | ICD-10-CM | POA: Diagnosis not present

## 2016-08-20 DIAGNOSIS — N3091 Cystitis, unspecified with hematuria: Secondary | ICD-10-CM

## 2016-08-20 DIAGNOSIS — R7989 Other specified abnormal findings of blood chemistry: Secondary | ICD-10-CM

## 2016-08-20 DIAGNOSIS — R972 Elevated prostate specific antigen [PSA]: Secondary | ICD-10-CM

## 2016-08-20 DIAGNOSIS — N410 Acute prostatitis: Secondary | ICD-10-CM | POA: Diagnosis not present

## 2016-08-20 LAB — CBC WITH DIFFERENTIAL/PLATELET
BASOS ABS: 68 {cells}/uL (ref 0–200)
Basophils Relative: 1 %
EOS ABS: 136 {cells}/uL (ref 15–500)
Eosinophils Relative: 2 %
HEMATOCRIT: 36 % — AB (ref 38.5–50.0)
HEMOGLOBIN: 11.5 g/dL — AB (ref 13.2–17.1)
LYMPHS ABS: 1836 {cells}/uL (ref 850–3900)
Lymphocytes Relative: 27 %
MCH: 26.4 pg — AB (ref 27.0–33.0)
MCHC: 31.9 g/dL — AB (ref 32.0–36.0)
MCV: 82.8 fL (ref 80.0–100.0)
MONO ABS: 340 {cells}/uL (ref 200–950)
MPV: 8.2 fL (ref 7.5–12.5)
Monocytes Relative: 5 %
NEUTROS ABS: 4420 {cells}/uL (ref 1500–7800)
NEUTROS PCT: 65 %
Platelets: 538 10*3/uL — ABNORMAL HIGH (ref 140–400)
RBC: 4.35 MIL/uL (ref 4.20–5.80)
RDW: 14.7 % (ref 11.0–15.0)
WBC: 6.8 10*3/uL (ref 3.8–10.8)

## 2016-08-20 LAB — BASIC METABOLIC PANEL
BUN: 11 mg/dL (ref 7–25)
CHLORIDE: 103 mmol/L (ref 98–110)
CO2: 23 mmol/L (ref 20–31)
CREATININE: 1.19 mg/dL — AB (ref 0.70–1.18)
Calcium: 8.8 mg/dL (ref 8.6–10.3)
Glucose, Bld: 138 mg/dL — ABNORMAL HIGH (ref 65–99)
POTASSIUM: 4.9 mmol/L (ref 3.5–5.3)
Sodium: 135 mmol/L (ref 135–146)

## 2016-08-20 LAB — POC MICROSCOPIC URINALYSIS (UMFC): MUCUS RE: ABSENT

## 2016-08-20 LAB — POCT URINALYSIS DIP (MANUAL ENTRY)
BILIRUBIN UA: NEGATIVE
Bilirubin, UA: NEGATIVE
GLUCOSE UA: NEGATIVE
Nitrite, UA: NEGATIVE
PROTEIN UA: NEGATIVE
SPEC GRAV UA: 1.01
Urobilinogen, UA: 0.2
pH, UA: 5.5

## 2016-08-20 LAB — PSA: PSA: 22 ng/mL — ABNORMAL HIGH (ref <=4.0)

## 2016-08-20 MED ORDER — SULFAMETHOXAZOLE-TRIMETHOPRIM 800-160 MG PO TABS: 1.0000 | ORAL_TABLET | Freq: Two times a day (BID) | ORAL | 0 refills | Status: DC

## 2016-08-20 NOTE — Progress Notes (Addendum)
By signing my name below, I, Mesha Guinyard, attest that this documentation has been prepared under the direction and in the presence of [name], MD.  Electronically Signed: Verlee Monte, Medical Scribe. 08/20/16. 10:39 AM.  Subjective:    Patient ID: Tony Price, male    DOB: 10-22-1940, 75 y.o.   MRN: [id number]  HPI Chief Complaint  Patient presents with  . Follow-up    for cystitis    HPI Comments: Tony Price is a 75 y.o. male who presents to the [facility] for UTI follow-up. Initially seen 10/14; treated for possible early hemorraghic cystitis. Repeat visit on 10/17; was continued cipro at that time, and was improving. It was noted after the office visit that the E. Coli was resistant to cipro so he was changed to septra; he started septra on 10/18. Seen for followed up 10/24; at that time he had a temperature of 100.4, nocturia 3-4x per night vs prior 2-3x per night, but his WBCs were nl at 9.4, PSA had inc indicating possible prostatitis, multiple colonies on culture. He did a recheck on 10/28 with Rosario Adie, PA-C, and still had low-grade fever of 100.6. He was complaining of frequent fever, urinary frequency, and dec output at that visit but frequent urination with decreased amount each time. Based on those notes he was improved. His CBC was stable, BMP had slight elevation of his creatinene at 1.39, and a negative urine culture.  Lab Results  Component Value Date   PSA 21.9 (H) 08/10/2016   PSA 7.3 (H) 07/31/2016   Reports fatigue and very low fevers, with his temperature 98.3 yesterday; the last time fever was 100.3 was 2 days ago. Pt's blood sugar has been around 137-140 when he feels fatigue (was off Amaryl while taking Cipro, but had not restarted on Septra.  His last septra dosage was 2 days ago (completed Rx), at that time point he was taking 7 days of septra after 4 days of cipro. Denies trouble initiating urine, nausea, vomiting,  abdominal pain, and back pain.   Left eye infection: Seen 10/28, 2 days of redness in left eye at that time. Started on erythromycin at that time.  Reports some improvement but still watery, some photophobia yesterday but fine this morning, and continues to have eye redness. Pt had cataract surgery 8/18 and 9/17. Compliant with erythromycin and uses it once at bedtime. Denies morning discharge on lids, and visual changes.  Visual Acuity Screening   Right eye Left eye Both eyes  Without correction: 20/30 -1 20/30 -1 20/30  With correction:      Cough: Has had a cough for the past few days and has not been taking anything for it.  Patient Active Problem List   Diagnosis Date Noted  . Diabetic retinopathy ([place]) 03/31/2016  . Onychomycosis 02/25/2016  . UTI (lower urinary tract infection) 11/17/2014  . DM (diabetes mellitus), type 2 ([place]) 11/17/2014  . HTN (hypertension) 11/17/2014  . Sepsis ([place]) 11/17/2014   Past Medical History:  Diagnosis Date  . Diabetes mellitus   . Hypercholesterolemia   . Hypertension   . Kidney stones    Past Surgical History:  Procedure Laterality Date  . CATARACT EXTRACTION    . EYE SURGERY    . PROSTATE BIOPSY    . ROTATOR CUFF REPAIR     Right   No Known Allergies Prior to Admission medications   Medication Sig Start Date End Date Taking? Authorizing Provider  Ascorbic Acid (  VITAMIN C) 1000 MG tablet Take 1,000 mg by mouth daily.   Yes Historical Provider, MD  aspirin 81 MG chewable tablet Chew by mouth daily.   Yes Historical Provider, MD  atorvastatin (LIPITOR) 20 MG tablet TAKE 1 TABLET (20 MG TOTAL) BY MOUTH DAILY. 07/28/16  Yes [name], MD  erythromycin [facility]) ophthalmic ointment Place 1 application into the left eye at bedtime. 08/14/16  Yes Jaynee Eagles, PA-C  glimepiride (AMARYL) 2 MG tablet TAKE 1 TABLET (2 MG TOTAL) BY MOUTH 2 (TWO) TIMES DAILY. 07/30/16  Yes [name], MD  latanoprost (XALATAN) 0.005 % ophthalmic  solution Place 1 drop into both eyes at bedtime.   Yes Historical Provider, MD  quinapril (ACCUPRIL) 20 MG tablet Take 1 tablet (20 mg total) by mouth daily. 11/10/15  Yes [name], MD  acetaminophen (TYLENOL) 500 MG tablet Take 1,000 mg by mouth every 6 (six) hours as needed for mild pain or headache.    Historical Provider, MD  amLODipine (NORVASC) 5 MG tablet TAKE 1 TABLET (5 MG TOTAL) BY MOUTH DAILY. Patient not taking: Reported on 08/20/2016 07/30/16   [name], MD  Blood Glucose Monitoring Suppl (ONE TOUCH ULTRA SYSTEM KIT) w/Device KIT Test blood sugar once daily. Dx: E11.9 Patient not taking: Reported on 08/20/2016 07/01/16   [name], MD  carboxymethylcellulose (REFRESH PLUS) 0.5 % SOLN     Historical Provider, MD  glucose blood test strip Test blood sugar once daily. Dx: E11.9 Patient not taking: Reported on 08/20/2016 07/01/16   [name], MD  ipratropium (ATROVENT) 0.06 % nasal spray Place 1 spray into both nostrils 3 (three) times daily as needed for rhinitis. Patient not taking: Reported on 08/20/2016 08/10/16   [name], MD  Lancets [facility] ULTRASOFT) lancets Test blood sugar once daily. Dx: E11.9 Patient not taking: Reported on 08/20/2016 07/01/16   [name], MD  sulfamethoxazole-trimethoprim (BACTRIM DS,SEPTRA DS) 800-160 MG tablet Take 1 tablet by mouth 2 (two) times daily. Patient not taking: Reported on 08/20/2016 08/10/16   [name], MD   Social History   Social History  . Marital status: Divorced    Spouse name: [name]  . Number of children: [name]  . Years of education: [name]   Occupational History  . Not on file.   Social History Main Topics  . Smoking status: Former Smoker    Years: 2.00  . Smokeless tobacco: Never Used  . Alcohol use No  . Drug use: No  . Sexual activity: Not on file   Other Topics Concern  . Not on file   Social History Narrative  . No narrative on file   Review of Systems  Eyes: Positive for  photophobia and redness. Negative for discharge and visual disturbance.  Gastrointestinal: Negative for abdominal pain, nausea and vomiting.  Genitourinary: Negative for difficulty urinating.  Musculoskeletal: Negative for back pain.   Objective:  Physical Exam  Constitutional: He appears well-developed and well-nourished. No distress.  HENT:  Head: Normocephalic and atraumatic.  Eyes: Pupils are equal, round, and reactive to light. Right conjunctiva is injected. Left conjunctiva is injected (greater).  Lid swelling Injection L>R minimal upper loaded edema without stye  Neck: Neck supple.  Cardiovascular: Normal rate, regular rhythm and normal heart sounds.  Exam reveals no gallop and no friction rub.   No murmur heard. Pulmonary/Chest: Effort normal and breath sounds normal. No respiratory distress. He has no wheezes. He has no rales.  Abdominal: Soft. There is no tenderness. There is no rebound, no guarding and no CVA tenderness.  Neurological: He is alert.  Skin: Skin is warm and dry.  Psychiatric: He has a normal mood and affect. His behavior is normal.  Nursing note and vitals reviewed.  BP 128/70   Pulse 66   Temp 98.5 F (36.9 C) (Oral)   Resp 18   Ht _0  (1.676 m)   Wt 171 lb (77.6 kg)   SpO2 98%   BMI 27.60 kg/m    Results for orders placed or performed in visit on 08/20/16  POCT urinalysis dipstick  Result Value Ref Range   Color, UA yellow yellow   Clarity, UA clear clear   Glucose, UA negative negative   Bilirubin, UA negative negative   Ketones, POC UA negative negative   Spec Grav, UA 1.010    Blood, UA trace-intact (A) negative   pH, UA 5.5    Protein Ur, POC negative negative   Urobilinogen, UA 0.2    Nitrite, UA Negative Negative   Leukocytes, UA small (1+) (A) Negative  POCT Microscopic Urinalysis ([facility])  Result Value Ref Range   WBC,UR,HPF,POC Few (A) None WBC/hpf   RBC,UR,HPF,POC None None RBC/hpf   Bacteria Few (A) None, Too numerous to count    Mucus Absent Absent   Epithelial Cells, UR Per Microscopy None None, Too numerous to count cells/hpf    Visual Acuity Screening   Right eye Left eye Both eyes  Without correction: 20/30 -1 20/30 -1 20/30  With correction:       Assessment & Plan:      Tony Price is a 75 y.o. male Elevated PSA - Plan: PSA, CANCELED: PSA, Medicare Hemorrhagic cystitis - Plan: sulfamethoxazole-trimethoprim (BACTRIM DS,SEPTRA DS) 800-160 MG tablet Acute prostatitis - Plan: CBC with Differential/Platelet, POCT urinalysis dipstick, POCT Microscopic Urinalysis ([facility]), PSA, sulfamethoxazole-trimethoprim (BACTRIM DS,SEPTRA DS) 800-160 MG tablet, CANCELED: PSA, Medicare  - Appears to be overall improved, but still some symptoms and last low-grade fever a few days prior. Restart Septra, recheck PSA. Return visit in one week.  Conjunctivitis of left eye, unspecified conjunctivitis type  -Increase erythromycin ointment to 4 times per day. Overall improved, but still some residual symptoms. RTC precautions if not improving with increased frequency of erythromycin ointment or sooner if worse   Elevated serum creatinine - Plan: Basic metabolic panel  After AVS printed, advised patient he can restart his Amaryl, initially once per day, monitor home blood sugar readings. Chance for hypoglycemia more likely with Cipro than Septra. Also advised on benefits of improved glycemic control with infection treatment    Meds ordered this encounter  Medications  . sulfamethoxazole-trimethoprim (BACTRIM DS,SEPTRA DS) 800-160 MG tablet    Sig: Take 1 tablet by mouth 2 (two) times daily.    Dispense:  14 tablet    Refill:  0   Patient Instructions    For your eye infection, increase to the ointment to 4 times per day. Warm compresses if needed, and if redness and swelling not improving in the next 3 days, would like you to return to the office.Return to the clinic or go to the nearest emergency room if any of your  symptoms worsen or new symptoms occur.  Restart Septra for prostate infection. I will recheck your prostate test and kidney function today. Return in the next 1 week, sooner if any return of fever or worsening symptoms.   Prostatitis The prostate gland is about the size and  shape of a walnut. It is located just below your bladder. It produces one of the components of semen, which is made up of sperm and the fluids that help nourish and transport it out from the testicles. Prostatitis is inflammation of the prostate gland.  There are four types of prostatitis:  Acute bacterial prostatitis. This is the least common type of prostatitis. It starts quickly and usually is associated with a bladder infection, high fever, and shaking chills. It can occur at any age.  Chronic bacterial prostatitis. This is a persistent bacterial infection in the prostate. It usually develops from repeated acute bacterial prostatitis or acute bacterial prostatitis that was not properly treated. It can occur in men of any age but is most common in middle-aged men whose prostate has begun to enlarge. The symptoms are not as severe as those in acute bacterial prostatitis. Discomfort in the part of your body that is in front of your rectum and below your scrotum (perineum), lower abdomen, or in the head of your penis (glans) may represent your primary discomfort.  Chronic prostatitis (nonbacterial). This is the most common type of prostatitis. It is inflammation of the prostate gland that is not caused by a bacterial infection. The cause is unknown and may be associated with a viral infection or autoimmune disorder.  Prostatodynia (pelvic floor disorder). This is associated with increased muscular tone in the pelvis surrounding the prostate. CAUSES The causes of bacterial prostatitis are bacterial infection. The causes of the other types of prostatitis are unknown.  SYMPTOMS  Symptoms can vary depending upon the type of  prostatitis that exists. There can also be overlap in symptoms. Possible symptoms for each type of prostatitis are listed below. Acute Bacterial Prostatitis  Painful urination.  Fever or chills.  Muscle or joint pains.  Low back pain.  Low abdominal pain.  Inability to empty bladder completely. Chronic Bacterial Prostatitis, Chronic Nonbacterial Prostatitis, and Prostatodynia  Sudden urge to urinate.  Frequent urination.  Difficulty starting urine stream.  Weak urine stream.  Discharge from the urethra.  Dribbling after urination.  Rectal pain.  Pain in the testicles, penis, or tip of the penis.  Pain in the perineum.  Problems with sexual function.  Painful ejaculation.  Bloody semen. DIAGNOSIS  In order to diagnose prostatitis, your health care provider will ask about your symptoms. One or more urine samples will be taken and tested (urinalysis). If the urinalysis result is negative for bacteria, your health care provider may use a finger to feel your prostate (digital rectal exam). This exam helps your health care provider determine if your prostate is swollen and tender. It will also produce a specimen of semen that can be analyzed. TREATMENT  Treatment for prostatitis depends on the cause. If a bacterial infection is the cause, it can be treated with antibiotic medicine. In cases of chronic bacterial prostatitis, the use of antibiotics for up to 1 month or 6 weeks may be necessary. Your health care provider may instruct you to take sitz baths to help relieve pain. A sitz bath is a bath of hot water in which your hips and buttocks are under water. This relaxes the pelvic floor muscles and often helps to relieve the pressure on your prostate. HOME CARE INSTRUCTIONS   Take all medicines as directed by your health care provider.  Take sitz baths as directed by your health care provider. SEEK MEDICAL CARE IF:   Your symptoms get worse, not better.  You have a  fever. SEEK IMMEDIATE MEDICAL CARE IF:   You have chills.  You feel nauseous or vomit.  You feel lightheaded or faint.  You are unable to urinate.  You have blood or blood clots in your urine. MAKE SURE YOU:  Understand these instructions.  Will watch your condition.  Will get help right away if you are not doing well or get worse.   This information is not intended to replace advice given to you by your health care provider. Make sure you discuss any questions you have with your health care provider.   Document Released: 10/01/2000 Document Revised: 10/25/2014 Document Reviewed: 04/23/2013 Elsevier Interactive Patient Education 2016 Elsevier Inc[employer]  Bacterial Conjunctivitis Bacterial conjunctivitis, commonly called pink eye, is an inflammation of the clear membrane that covers the white part of the eye (conjunctiva). The inflammation can also happen on the underside of the eyelids. The blood vessels in the conjunctiva become inflamed, causing the eye to become red or pink. Bacterial conjunctivitis may spread easily from one eye to another and from person to person (contagious).  CAUSES  Bacterial conjunctivitis is caused by bacteria. The bacteria may come from your own skin, your upper respiratory tract, or from someone else with bacterial conjunctivitis. SYMPTOMS  The normally white color of the eye or the underside of the eyelid is usually pink or red. The pink eye is usually associated with irritation, tearing, and some sensitivity to light. Bacterial conjunctivitis is often associated with a thick, yellowish discharge from the eye. The discharge may turn into a crust on the eyelids overnight, which causes your eyelids to stick together. If a discharge is present, there may also be some blurred vision in the affected eye. DIAGNOSIS  Bacterial conjunctivitis is diagnosed by your caregiver through an eye exam and the symptoms that you report. Your caregiver looks for changes in the  surface tissues of your eyes, which may point to the specific type of conjunctivitis. A sample of any discharge may be collected on a cotton-tip swab if you have a severe case of conjunctivitis, if your cornea is affected, or if you keep getting repeat infections that do not respond to treatment. The sample will be sent to a lab to see if the inflammation is caused by a bacterial infection and to see if the infection will respond to antibiotic medicines. TREATMENT   Bacterial conjunctivitis is treated with antibiotics. Antibiotic eyedrops are most often used. However, antibiotic ointments are also available. Antibiotics pills are sometimes used. Artificial tears or eye washes may ease discomfort. HOME CARE INSTRUCTIONS   To ease discomfort, apply a cool, clean washcloth to your eye for 10-20 minutes, 3-4 times a day.  Gently wipe away any drainage from your eye with a warm, wet washcloth or a cotton ball.  Wash your hands often with soap and water. Use paper towels to dry your hands.  Do not share towels or washcloths. This may spread the infection.  Change or wash your pillowcase every day.  You should not use eye makeup until the infection is gone.  Do not operate machinery or drive if your vision is blurred.  Stop using contact lenses. Ask your caregiver how to sterilize or replace your contacts before using them again. This depends on the type of contact lenses that you use.  When applying medicine to the infected eye, do not touch the edge of your eyelid with the eyedrop bottle or ointment tube. SEEK IMMEDIATE MEDICAL CARE IF:   Your infection has not  improved within 3 days after beginning treatment.  You had yellow discharge from your eye and it returns.  You have increased eye pain.  Your eye redness is spreading.  Your vision becomes blurred.  You have a fever or persistent symptoms for more than 2-3 days.  You have a fever and your symptoms suddenly get worse.  You  have facial pain, redness, or swelling. MAKE SURE YOU:   Understand these instructions.  Will watch your condition.  Will get help right away if you are not doing well or get worse.   This information is not intended to replace advice given to you by your health care provider. Make sure you discuss any questions you have with your health care provider.   Document Released: 10/04/2005 Document Revised: 10/25/2014 Document Reviewed: 03/06/2012 Elsevier Interactive Patient Education 2016 [employer].    IF you received an x-ray today, you will receive an invoice from [facility]. Please contact [facility] at [phone] with questions or concerns regarding your invoice.   IF you received labwork today, you will receive an invoice from [employer]. Please contact [facility] at [phone] with questions or concerns regarding your invoice.   Our billing staff will not be able to assist you with questions regarding bills from these companies.  You will be contacted with the lab results as soon as they are available. The fastest way to get your results is to activate your My Chart account. Instructions are located on the last page of this paperwork. If you have not heard from [place] regarding the results in 2 weeks, please contact this office.        I personally performed the services described in this documentation, which was scribed in my presence. The recorded information has been reviewed and considered, and addended by me as needed.   Signed,   [name], MD [facility].  08/20/16 11:48 AM

## 2016-08-20 NOTE — Patient Instructions (Addendum)
For your eye infection, increase to the ointment to 4 times per day. Warm compresses if needed, and if redness and swelling not improving in the next 3 days, would like you to return to the office.Return to the clinic or go to the nearest emergency room if any of your symptoms worsen or new symptoms occur.  Restart Septra for prostate infection. I will recheck your prostate test and kidney function today. Return in the next 1 week, sooner if any return of fever or worsening symptoms.   Prostatitis The prostate gland is about the size and shape of a walnut. It is located just below your bladder. It produces one of the components of semen, which is made up of sperm and the fluids that help nourish and transport it out from the testicles. Prostatitis is inflammation of the prostate gland.  There are four types of prostatitis:  Acute bacterial prostatitis. This is the least common type of prostatitis. It starts quickly and usually is associated with a bladder infection, high fever, and shaking chills. It can occur at any age.  Chronic bacterial prostatitis. This is a persistent bacterial infection in the prostate. It usually develops from repeated acute bacterial prostatitis or acute bacterial prostatitis that was not properly treated. It can occur in men of any age but is most common in middle-aged men whose prostate has begun to enlarge. The symptoms are not as severe as those in acute bacterial prostatitis. Discomfort in the part of your body that is in front of your rectum and below your scrotum (perineum), lower abdomen, or in the head of your penis (glans) may represent your primary discomfort.  Chronic prostatitis (nonbacterial). This is the most common type of prostatitis. It is inflammation of the prostate gland that is not caused by a bacterial infection. The cause is unknown and may be associated with a viral infection or autoimmune disorder.  Prostatodynia (pelvic floor disorder). This is  associated with increased muscular tone in the pelvis surrounding the prostate. CAUSES The causes of bacterial prostatitis are bacterial infection. The causes of the other types of prostatitis are unknown.  SYMPTOMS  Symptoms can vary depending upon the type of prostatitis that exists. There can also be overlap in symptoms. Possible symptoms for each type of prostatitis are listed below. Acute Bacterial Prostatitis  Painful urination.  Fever or chills.  Muscle or joint pains.  Low back pain.  Low abdominal pain.  Inability to empty bladder completely. Chronic Bacterial Prostatitis, Chronic Nonbacterial Prostatitis, and Prostatodynia  Sudden urge to urinate.  Frequent urination.  Difficulty starting urine stream.  Weak urine stream.  Discharge from the urethra.  Dribbling after urination.  Rectal pain.  Pain in the testicles, penis, or tip of the penis.  Pain in the perineum.  Problems with sexual function.  Painful ejaculation.  Bloody semen. DIAGNOSIS  In order to diagnose prostatitis, your health care provider will ask about your symptoms. One or more urine samples will be taken and tested (urinalysis). If the urinalysis result is negative for bacteria, your health care provider may use a finger to feel your prostate (digital rectal exam). This exam helps your health care provider determine if your prostate is swollen and tender. It will also produce a specimen of semen that can be analyzed. TREATMENT  Treatment for prostatitis depends on the cause. If a bacterial infection is the cause, it can be treated with antibiotic medicine. In cases of chronic bacterial prostatitis, the use of antibiotics for up to 1  month or 6 weeks may be necessary. Your health care provider may instruct you to take sitz baths to help relieve pain. A sitz bath is a bath of hot water in which your hips and buttocks are under water. This relaxes the pelvic floor muscles and often helps to  relieve the pressure on your prostate. HOME CARE INSTRUCTIONS   Take all medicines as directed by your health care provider.  Take sitz baths as directed by your health care provider. SEEK MEDICAL CARE IF:   Your symptoms get worse, not better.  You have a fever. SEEK IMMEDIATE MEDICAL CARE IF:   You have chills.  You feel nauseous or vomit.  You feel lightheaded or faint.  You are unable to urinate.  You have blood or blood clots in your urine. MAKE SURE YOU:  Understand these instructions.  Will watch your condition.  Will get help right away if you are not doing well or get worse.   This information is not intended to replace advice given to you by your health care provider. Make sure you discuss any questions you have with your health care provider.   Document Released: 10/01/2000 Document Revised: 10/25/2014 Document Reviewed: 04/23/2013 Elsevier Interactive Patient Education 2016 [employer]  Bacterial Conjunctivitis Bacterial conjunctivitis, commonly called pink eye, is an inflammation of the clear membrane that covers the white part of the eye (conjunctiva). The inflammation can also happen on the underside of the eyelids. The blood vessels in the conjunctiva become inflamed, causing the eye to become red or pink. Bacterial conjunctivitis may spread easily from one eye to another and from person to person (contagious).  CAUSES  Bacterial conjunctivitis is caused by bacteria. The bacteria may come from your own skin, your upper respiratory tract, or from someone else with bacterial conjunctivitis. SYMPTOMS  The normally white color of the eye or the underside of the eyelid is usually pink or red. The pink eye is usually associated with irritation, tearing, and some sensitivity to light. Bacterial conjunctivitis is often associated with a thick, yellowish discharge from the eye. The discharge may turn into a crust on the eyelids overnight, which causes your eyelids to  stick together. If a discharge is present, there may also be some blurred vision in the affected eye. DIAGNOSIS  Bacterial conjunctivitis is diagnosed by your caregiver through an eye exam and the symptoms that you report. Your caregiver looks for changes in the surface tissues of your eyes, which may point to the specific type of conjunctivitis. A sample of any discharge may be collected on a cotton-tip swab if you have a severe case of conjunctivitis, if your cornea is affected, or if you keep getting repeat infections that do not respond to treatment. The sample will be sent to a lab to see if the inflammation is caused by a bacterial infection and to see if the infection will respond to antibiotic medicines. TREATMENT   Bacterial conjunctivitis is treated with antibiotics. Antibiotic eyedrops are most often used. However, antibiotic ointments are also available. Antibiotics pills are sometimes used. Artificial tears or eye washes may ease discomfort. HOME CARE INSTRUCTIONS   To ease discomfort, apply a cool, clean washcloth to your eye for 10-20 minutes, 3-4 times a day.  Gently wipe away any drainage from your eye with a warm, wet washcloth or a cotton ball.  Wash your hands often with soap and water. Use paper towels to dry your hands.  Do not share towels or washcloths. This may  spread the infection.  Change or wash your pillowcase every day.  You should not use eye makeup until the infection is gone.  Do not operate machinery or drive if your vision is blurred.  Stop using contact lenses. Ask your caregiver how to sterilize or replace your contacts before using them again. This depends on the type of contact lenses that you use.  When applying medicine to the infected eye, do not touch the edge of your eyelid with the eyedrop bottle or ointment tube. SEEK IMMEDIATE MEDICAL CARE IF:   Your infection has not improved within 3 days after beginning treatment.  You had yellow  discharge from your eye and it returns.  You have increased eye pain.  Your eye redness is spreading.  Your vision becomes blurred.  You have a fever or persistent symptoms for more than 2-3 days.  You have a fever and your symptoms suddenly get worse.  You have facial pain, redness, or swelling. MAKE SURE YOU:   Understand these instructions.  Will watch your condition.  Will get help right away if you are not doing well or get worse.   This information is not intended to replace advice given to you by your health care provider. Make sure you discuss any questions you have with your health care provider.   Document Released: 10/04/2005 Document Revised: 10/25/2014 Document Reviewed: 03/06/2012 Elsevier Interactive Patient Education 2016 [employer].    IF you received an x-ray today, you will receive an invoice from [facility]. Please contact [facility] at [phone] with questions or concerns regarding your invoice.   IF you received labwork today, you will receive an invoice from [employer]. Please contact [facility] at [phone] with questions or concerns regarding your invoice.   Our billing staff will not be able to assist you with questions regarding bills from these companies.  You will be contacted with the lab results as soon as they are available. The fastest way to get your results is to activate your My Chart account. Instructions are located on the last page of this paperwork. If you have not heard from [place] regarding the results in 2 weeks, please contact this office.

## 2016-08-21 ENCOUNTER — Other Ambulatory Visit: Payer: Self-pay | Admitting: Urgent Care

## 2016-08-21 DIAGNOSIS — H5789 Other specified disorders of eye and adnexa: Secondary | ICD-10-CM

## 2016-08-26 ENCOUNTER — Ambulatory Visit: Payer: Medicare Other | Admitting: Podiatry

## 2016-08-27 ENCOUNTER — Other Ambulatory Visit: Payer: Self-pay | Admitting: Family Medicine

## 2016-08-27 DIAGNOSIS — I10 Essential (primary) hypertension: Secondary | ICD-10-CM

## 2016-08-27 DIAGNOSIS — E119 Type 2 diabetes mellitus without complications: Secondary | ICD-10-CM

## 2016-08-28 ENCOUNTER — Ambulatory Visit (INDEPENDENT_AMBULATORY_CARE_PROVIDER_SITE_OTHER): Payer: Medicare Other

## 2016-08-28 ENCOUNTER — Ambulatory Visit (INDEPENDENT_AMBULATORY_CARE_PROVIDER_SITE_OTHER): Payer: Medicare Other | Admitting: Family Medicine

## 2016-08-28 VITALS — BP 110/58 | HR 67 | Temp 97.9°F | Resp 18 | Ht 66.0 in | Wt 171.8 lb

## 2016-08-28 DIAGNOSIS — R059 Cough, unspecified: Secondary | ICD-10-CM

## 2016-08-28 DIAGNOSIS — N410 Acute prostatitis: Secondary | ICD-10-CM

## 2016-08-28 DIAGNOSIS — H5789 Other specified disorders of eye and adnexa: Secondary | ICD-10-CM

## 2016-08-28 DIAGNOSIS — R05 Cough: Secondary | ICD-10-CM | POA: Diagnosis not present

## 2016-08-28 DIAGNOSIS — N3091 Cystitis, unspecified with hematuria: Secondary | ICD-10-CM

## 2016-08-28 DIAGNOSIS — H578 Other specified disorders of eye and adnexa: Secondary | ICD-10-CM

## 2016-08-28 LAB — POCT URINALYSIS DIP (MANUAL ENTRY)
Bilirubin, UA: NEGATIVE
Blood, UA: NEGATIVE
Glucose, UA: NEGATIVE
Ketones, POC UA: NEGATIVE
Nitrite, UA: NEGATIVE
PROTEIN UA: NEGATIVE
UROBILINOGEN UA: 0.2
pH, UA: 6

## 2016-08-28 LAB — POCT CBC
GRANULOCYTE PERCENT: 62.3 % (ref 37–80)
HCT, POC: 33.2 % — AB (ref 43.5–53.7)
Hemoglobin: 11.6 g/dL — AB (ref 14.1–18.1)
LYMPH, POC: 2.1 (ref 0.6–3.4)
MCH, POC: 27.1 pg (ref 27–31.2)
MCHC: 35 g/dL (ref 31.8–35.4)
MCV: 77.5 fL — AB (ref 80–97)
MID (cbc): 0.3 (ref 0–0.9)
MPV: 6.7 fL (ref 0–99.8)
PLATELET COUNT, POC: 372 10*3/uL (ref 142–424)
POC Granulocyte: 4 (ref 2–6.9)
POC LYMPH %: 32.5 % (ref 10–50)
POC MID %: 5.2 %M (ref 0–12)
RBC: 4.28 M/uL — AB (ref 4.69–6.13)
RDW, POC: 14.6 %
WBC: 6.5 10*3/uL (ref 4.6–10.2)

## 2016-08-28 LAB — POC MICROSCOPIC URINALYSIS (UMFC)

## 2016-08-28 LAB — PSA: PSA: 18.1 ng/mL — ABNORMAL HIGH (ref <=4.0)

## 2016-08-28 MED ORDER — ERYTHROMYCIN 5 MG/GM OP OINT: 1.0000 "application " | TOPICAL_OINTMENT | Freq: Three times a day (TID) | OPHTHALMIC | 0 refills | Status: DC

## 2016-08-28 MED ORDER — SULFAMETHOXAZOLE-TRIMETHOPRIM 800-160 MG PO TABS: 1.0000 | ORAL_TABLET | Freq: Two times a day (BID) | ORAL | 0 refills | Status: DC

## 2016-08-28 NOTE — Progress Notes (Addendum)
Subjective:  By signing my name below, I, Tony Price, attest that this documentation has been prepared under the direction and in the presence of Tony Ray, MD. Electronically Signed: [name], [place]. 08/28/2016 , 9:38 AM .  Patient was seen in Room 14 .   Patient ID: Tony Price, male    DOB: [birth year], 75 y.o.   MRN: [id number] Chief Complaint  Patient presents with  . Follow-up    Prostatitis   HPI  Tony Price is a 75 y.o. male Here for follow up of prostatitis/UTI. See last visit on Nov 3rd. Treated initially Oct 14th with early hemorrhagic cystitis. His Cipro was changed to Septra due to resistance. He was off Septra at last visit still with some symptoms and fever was started. He was also to restart amaryl for diabetes, as no longer on quinapril. His PSA was significantly elevated at last visit 8 days ago at 22.0 up from 7.3 1 month ago.   Patient reports he hasn't any any recent fevers while being on Septra. He does note having nocturia more because he's drinking more water, at about 2L of water a day. He denies dysuria or trouble with initiation of urination. He denies nausea, vomiting or diarrhea.   Patient also reports having a cough with phlegm ongoing for a few weeks. His wife describes the cough as a "deep cough". He's been taking robitussin. He denies any new fever, wheezing, shortness of breath, congestion or rhinorrhea.   At end of visit as AVS presented - asked about eye redness. See last visit.  Still using erythromycin ointment- 2 times per day since last visit.  Left eye is still red.  Had cataract surgery 8 and 9 months ago. OpthoHerbert Deaner. Denies blurry vision, some light sensitivity.  Feels like something under lid. Overall feels like it is better, just still feeling under that eye. Seen initially 10/28, 2 days left eye redness at that time. Treated with erythromycin ointment, but initially at night only, then last OV recommended QID.  Only using  twice per day as running out.   Patient Active Problem List   Diagnosis Date Noted  . Diabetic retinopathy ([place]) 03/31/2016  . Onychomycosis 02/25/2016  . UTI (lower urinary tract infection) 11/17/2014  . DM (diabetes mellitus), type 2 ([place]) 11/17/2014  . HTN (hypertension) 11/17/2014  . Sepsis ([place]) 11/17/2014   Past Medical History:  Diagnosis Date  . Diabetes mellitus   . Hypercholesterolemia   . Hypertension   . Kidney stones    Past Surgical History:  Procedure Laterality Date  . CATARACT EXTRACTION    . EYE SURGERY    . PROSTATE BIOPSY    . ROTATOR CUFF REPAIR     Right   No Known Allergies Prior to Admission medications   Medication Sig Start Date End Date Taking? Authorizing Provider  amLODipine (NORVASC) 5 MG tablet TAKE 1 TABLET (5 MG TOTAL) BY MOUTH DAILY. 07/30/16  Yes [name], MD  Ascorbic Acid (VITAMIN C) 1000 MG tablet Take 1,000 mg by mouth daily.   Yes Historical Provider, MD  aspirin 81 MG chewable tablet Chew by mouth daily.   Yes Historical Provider, MD  atorvastatin (LIPITOR) 20 MG tablet TAKE 1 TABLET (20 MG TOTAL) BY MOUTH DAILY. 07/28/16  Yes [name], MD  Price Glucose Monitoring Suppl (ONE TOUCH ULTRA SYSTEM KIT) w/Device KIT Test Price sugar once daily. Dx: E11.9 07/01/16  Yes [name], MD  glimepiride (AMARYL) 2 MG tablet  TAKE 1 TABLET (2 MG TOTAL) BY MOUTH 2 (TWO) TIMES DAILY. 07/30/16  Yes [name], MD  Lancets [facility] ULTRASOFT) lancets Test Price sugar once daily. Dx: E11.9 07/01/16  Yes [name], MD  latanoprost (XALATAN) 0.005 % ophthalmic solution Place 1 drop into both eyes at bedtime.   Yes Historical Provider, MD  quinapril (ACCUPRIL) 20 MG tablet Take 1 tablet (20 mg total) by mouth daily. 11/10/15  Yes [name], MD  sulfamethoxazole-trimethoprim (BACTRIM DS,SEPTRA DS) 800-160 MG tablet Take 1 tablet by mouth 2 (two) times daily. 08/20/16  Yes [name], MD  glucose Price test strip  Test Price sugar once daily. Dx: E11.9 Patient not taking: Reported on 08/28/2016 07/01/16   [name], MD   Social History   Social History  . Marital status: Divorced    Spouse name: [name]  . Number of children: [name]  . Years of education: [name]   Occupational History  . Not on file.   Social History Main Topics  . Smoking status: Former Smoker    Years: 2.00  . Smokeless tobacco: Never Used  . Alcohol use No  . Drug use: No  . Sexual activity: Not on file   Other Topics Concern  . Not on file   Social History Narrative  . No narrative on file   Review of Systems  Constitutional: Negative for fatigue and unexpected weight change.  HENT: Negative for congestion, rhinorrhea and sore throat.   Eyes: Negative for visual disturbance.  Respiratory: Positive for cough. Negative for chest tightness, shortness of breath and wheezing.   Cardiovascular: Negative for chest pain, palpitations and leg swelling.  Gastrointestinal: Negative for abdominal pain and Price in stool.  Neurological: Negative for dizziness, light-headedness and headaches.       Objective:   Physical Exam  Constitutional: He is oriented to person, place, and time. He appears well-developed and well-nourished.  HENT:  Head: Normocephalic and atraumatic.  Eyes: EOM are normal. Pupils are equal, round, and reactive to light.  Injected sclera slightly medial and lateral conjunctiva of left eye. No exudate at canthi. Anterior chamber appears clear. There is a small inward facing lash at the lateral canthus that was removed with pickups.  Neck: No JVD present. Carotid bruit is not present.  Cardiovascular: Normal rate, regular rhythm and normal heart sounds.   No murmur heard. Pulmonary/Chest: Effort normal and breath sounds normal. He has no rales.  Musculoskeletal: He exhibits no edema.  Neurological: He is alert and oriented to person, place, and time.  Skin: Skin is warm and dry.  Psychiatric: He has a  normal mood and affect.  Vitals reviewed.   Visual Acuity Screening   Right eye Left eye Both eyes  Without correction: '20/25 20/25 20/20 '  With correction:        Vitals:   08/28/16 0902  BP: (!) 110/58  Pulse: 67  Resp: 18  Temp: 97.9 F (36.6 C)  TempSrc: Oral  SpO2: 98%  Weight: 171 lb 12.8 oz (77.9 kg)  Height: '5\' 6"'  (1.676 m)   Results for orders placed or performed in visit on 08/28/16  POCT CBC  Result Value Ref Range   WBC 6.5 4.6 - 10.2 K/uL   Lymph, poc 2.1 0.6 - 3.4   POC LYMPH PERCENT 32.5 10 - 50 %L   MID (cbc) 0.3 0 - 0.9   POC MID % 5.2 0 - 12 %M   POC Granulocyte 4.0 2 -  6.9   Granulocyte percent 62.3 37 - 80 %G   RBC 4.28 (A) 4.69 - 6.13 M/uL   Hemoglobin 11.6 (A) 14.1 - 18.1 g/dL   HCT, POC 33.2 (A) 43.5 - 53.7 %   MCV 77.5 (A) 80 - 97 fL   MCH, POC 27.1 27 - 31.2 pg   MCHC 35.0 31.8 - 35.4 g/dL   RDW, POC 14.6 %   Platelet Count, POC 372 142 - 424 K/uL   MPV 6.7 0 - 99.8 fL  POCT urinalysis dipstick  Result Value Ref Range   Color, UA yellow yellow   Clarity, UA clear clear   Glucose, UA negative negative   Bilirubin, UA negative negative   Ketones, POC UA negative negative   Spec Grav, UA <=1.005    Price, UA negative negative   pH, UA 6.0    Protein Ur, POC negative negative   Urobilinogen, UA 0.2    Nitrite, UA Negative Negative   Leukocytes, UA moderate (2+) (A) Negative  POCT Microscopic Urinalysis ([facility])  Result Value Ref Range   WBC,UR,HPF,POC Few (A) None WBC/hpf   RBC,UR,HPF,POC None None RBC/hpf   Bacteria Few (A) None, Too numerous to count   Mucus Present (A) Absent   Epithelial Cells, UR Per Microscopy Few (A) None, Too numerous to count cells/hpf  Dg Chest 2 View  Result Date: 08/28/2016 CLINICAL DATA:  Cough for 2 weeks EXAM: CHEST  2 VIEW COMPARISON:  07/31/2016 chest radiograph. FINDINGS: Stable cardiomediastinal silhouette with normal heart size. No pneumothorax. No pleural effusion. Lungs appear clear, with no  acute consolidative airspace disease and no pulmonary edema. IMPRESSION: No active cardiopulmonary disease. Electronically Signed   By: Ilona Sorrel M.D.   On: 08/28/2016 09:59      Assessment & Plan:    Landy Dunnavant is a 75 y.o. male Acute prostatitis - Plan: POCT CBC, POCT urinalysis dipstick, POCT Microscopic Urinalysis ([facility]), PSA, sulfamethoxazole-trimethoprim (BACTRIM DS,SEPTRA DS) 800-160 MG tablet Hemorrhagic cystitis - Plan: sulfamethoxazole-trimethoprim (BACTRIM DS,SEPTRA DS) 800-160 MG tablet  - Prostatitis, still some persistent and around his on urinalysis, symptomatically much improved. Will continue Septra for 1 additional week then recheck after completion of antibiotic. Repeat PSA pending. rtc precautions.   Cough - Plan: DG Chest 2 View, POCT CBC  -Improving, reassuring chest x-Price. Symptomatic care, likely viral illness that is improving. RTC precautions given.   Left eye redness Possible initial conjunctivitis, but single inward facing eyelash was removed today that may have been scratching conjunctiva/sclera. Okay to continue erythromycin ointment 3 times a day for the next few days, but if not improving, advised to follow-up with eye care provider early next week.  Meds ordered this encounter  Medications  . sulfamethoxazole-trimethoprim (BACTRIM DS,SEPTRA DS) 800-160 MG tablet    Sig: Take 1 tablet by mouth 2 (two) times daily.    Dispense:  14 tablet    Refill:  0   Patient Instructions     There were still some signs of infection on urine test today. Continue antibioitic for one more week. At that time, you should have been on sufficient antibiotic treatment, but as we discussed sometimes you need prolonged antibiotics with prostate infections.  Follow-up with me after completion of antibiotic in about 9 days, sooner if any persistent urinary symptoms or worsening.    Chest x-Price appeared okay. Mucinex as needed for cough, but that should continue to  improve.  Return to the clinic or go to the nearest emergency room  if any of your symptoms worsen or new symptoms occur.   Cough, Adult Coughing is a reflex that clears your throat and your airways. Coughing helps to heal and protect your lungs. It is normal to cough occasionally, but a cough that happens with other symptoms or lasts a long time may be a sign of a condition that needs treatment. A cough may last only 2-3 weeks (acute), or it may last longer than 8 weeks (chronic). CAUSES Coughing is commonly caused by:  Breathing in substances that irritate your lungs.  A viral or bacterial respiratory infection.  Allergies.  Asthma.  Postnasal drip.  Smoking.  Acid backing up from the stomach into the esophagus (gastroesophageal reflux).  Certain medicines.  Chronic lung problems, including COPD (or rarely, lung cancer).  Other medical conditions such as heart failure. HOME CARE INSTRUCTIONS  Pay attention to any changes in your symptoms. Take these actions to help with your discomfort:  Take medicines only as told by your health care provider.  If you were prescribed an antibiotic medicine, take it as told by your health care provider. Do not stop taking the antibiotic even if you start to feel better.  Talk with your health care provider before you take a cough suppressant medicine.  Drink enough fluid to keep your urine clear or pale yellow.  If the air is dry, use a cold steam vaporizer or humidifier in your bedroom or your home to help loosen secretions.  Avoid anything that causes you to cough at work or at home.  If your cough is worse at night, try sleeping in a semi-upright position.  Avoid cigarette smoke. If you smoke, quit smoking. If you need help quitting, ask your health care provider.  Avoid caffeine.  Avoid alcohol.  Rest as needed. SEEK MEDICAL CARE IF:   You have new symptoms.  You cough up pus.  Your cough does not get better after 2-3  weeks, or your cough gets worse.  You cannot control your cough with suppressant medicines and you are losing sleep.  You develop pain that is getting worse or pain that is not controlled with pain medicines.  You have a fever.  You have unexplained weight loss.  You have night sweats. SEEK IMMEDIATE MEDICAL CARE IF:  You cough up Price.  You have difficulty breathing.  Your heartbeat is very fast.   This information is not intended to replace advice given to you by your health care provider. Make sure you discuss any questions you have with your health care provider.   Document Released: 04/02/2011 Document Revised: 06/25/2015 Document Reviewed: 12/11/2014 Elsevier Interactive Patient Education 2016 Elsevier Inc[employer]    Prostatitis The prostate gland is about the size and shape of a walnut. It is located just below your bladder. It produces one of the components of semen, which is made up of sperm and the fluids that help nourish and transport it out from the testicles. Prostatitis is inflammation of the prostate gland.  There are four types of prostatitis:  Acute bacterial prostatitis. This is the least common type of prostatitis. It starts quickly and usually is associated with a bladder infection, high fever, and shaking chills. It can occur at any age.  Chronic bacterial prostatitis. This is a persistent bacterial infection in the prostate. It usually develops from repeated acute bacterial prostatitis or acute bacterial prostatitis that was not properly treated. It can occur in men of any age but is most common in middle-aged men whose  prostate has begun to enlarge. The symptoms are not as severe as those in acute bacterial prostatitis. Discomfort in the part of your body that is in front of your rectum and below your scrotum (perineum), lower abdomen, or in the head of your penis (glans) may represent your primary discomfort.  Chronic prostatitis (nonbacterial). This is the  most common type of prostatitis. It is inflammation of the prostate gland that is not caused by a bacterial infection. The cause is unknown and may be associated with a viral infection or autoimmune disorder.  Prostatodynia (pelvic floor disorder). This is associated with increased muscular tone in the pelvis surrounding the prostate. CAUSES The causes of bacterial prostatitis are bacterial infection. The causes of the other types of prostatitis are unknown.  SYMPTOMS  Symptoms can vary depending upon the type of prostatitis that exists. There can also be overlap in symptoms. Possible symptoms for each type of prostatitis are listed below. Acute Bacterial Prostatitis  Painful urination.  Fever or chills.  Muscle or joint pains.  Low back pain.  Low abdominal pain.  Inability to empty bladder completely. Chronic Bacterial Prostatitis, Chronic Nonbacterial Prostatitis, and Prostatodynia  Sudden urge to urinate.  Frequent urination.  Difficulty starting urine stream.  Weak urine stream.  Discharge from the urethra.  Dribbling after urination.  Rectal pain.  Pain in the testicles, penis, or tip of the penis.  Pain in the perineum.  Problems with sexual function.  Painful ejaculation.  Bloody semen. DIAGNOSIS  In order to diagnose prostatitis, your health care provider will ask about your symptoms. One or more urine samples will be taken and tested (urinalysis). If the urinalysis result is negative for bacteria, your health care provider may use a finger to feel your prostate (digital rectal exam). This exam helps your health care provider determine if your prostate is swollen and tender. It will also produce a specimen of semen that can be analyzed. TREATMENT  Treatment for prostatitis depends on the cause. If a bacterial infection is the cause, it can be treated with antibiotic medicine. In cases of chronic bacterial prostatitis, the use of antibiotics for up to 1 month  or 6 weeks may be necessary. Your health care provider may instruct you to take sitz baths to help relieve pain. A sitz bath is a bath of hot water in which your hips and buttocks are under water. This relaxes the pelvic floor muscles and often helps to relieve the pressure on your prostate. HOME CARE INSTRUCTIONS   Take all medicines as directed by your health care provider.  Take sitz baths as directed by your health care provider. SEEK MEDICAL CARE IF:   Your symptoms get worse, not better.  You have a fever. SEEK IMMEDIATE MEDICAL CARE IF:   You have chills.  You feel nauseous or vomit.  You feel lightheaded or faint.  You are unable to urinate.  You have Price or Price clots in your urine. MAKE SURE YOU:  Understand these instructions.  Will watch your condition.  Will get help right away if you are not doing well or get worse.   This information is not intended to replace advice given to you by your health care provider. Make sure you discuss any questions you have with your health care provider.   Document Released: 10/01/2000 Document Revised: 10/25/2014 Document Reviewed: 04/23/2013 Elsevier Interactive Patient Education 2016 [employer].   IF you received an x-Price today, you will receive an invoice from [facility]. Please  contact [facility] at [phone] with questions or concerns regarding your invoice.   IF you received labwork today, you will receive an invoice from [employer]. Please contact [facility] at [phone] with questions or concerns regarding your invoice.   Our billing staff will not be able to assist you with questions regarding bills from these companies.  You will be contacted with the lab results as soon as they are available. The fastest way to get your results is to activate your My Chart account. Instructions are located on the last page of this paperwork. If you have not heard from [place]  regarding the results in 2 weeks, please contact this office.     I personally performed the services described in this documentation, which was scribed in my presence. The recorded information has been reviewed and considered, and addended by me as needed.   Signed,   Tony Ray, MD [facility].  08/28/16 10:21 AM

## 2016-08-28 NOTE — Patient Instructions (Addendum)
There were still some signs of infection on urine test today. Continue antibioitic for one more week. At that time, you should have been on sufficient antibiotic treatment, but as we discussed sometimes you need prolonged antibiotics with prostate infections.  Follow-up with me after completion of antibiotic in about 9 days, sooner if any persistent urinary symptoms or worsening.    Chest x-ray appeared okay. Mucinex as needed for cough, but that should continue to improve.  Okay to continue the erythromycin ointment up to 3 times per day, but if the eye redness does not improve into Monday or Tuesday, call your eye doctor to be seen due to the persistent redness.  Return to the clinic or go to the nearest emergency room if any of your symptoms worsen or new symptoms occur.   Cough, Adult Coughing is a reflex that clears your throat and your airways. Coughing helps to heal and protect your lungs. It is normal to cough occasionally, but a cough that happens with other symptoms or lasts a long time may be a sign of a condition that needs treatment. A cough may last only 2-3 weeks (acute), or it may last longer than 8 weeks (chronic). CAUSES Coughing is commonly caused by:  Breathing in substances that irritate your lungs.  A viral or bacterial respiratory infection.  Allergies.  Asthma.  Postnasal drip.  Smoking.  Acid backing up from the stomach into the esophagus (gastroesophageal reflux).  Certain medicines.  Chronic lung problems, including COPD (or rarely, lung cancer).  Other medical conditions such as heart failure. HOME CARE INSTRUCTIONS  Pay attention to any changes in your symptoms. Take these actions to help with your discomfort:  Take medicines only as told by your health care provider.  If you were prescribed an antibiotic medicine, take it as told by your health care provider. Do not stop taking the antibiotic even if you start to feel better.  Talk with your  health care provider before you take a cough suppressant medicine.  Drink enough fluid to keep your urine clear or pale yellow.  If the air is dry, use a cold steam vaporizer or humidifier in your bedroom or your home to help loosen secretions.  Avoid anything that causes you to cough at work or at home.  If your cough is worse at night, try sleeping in a semi-upright position.  Avoid cigarette smoke. If you smoke, quit smoking. If you need help quitting, ask your health care provider.  Avoid caffeine.  Avoid alcohol.  Rest as needed. SEEK MEDICAL CARE IF:   You have new symptoms.  You cough up pus.  Your cough does not get better after 2-3 weeks, or your cough gets worse.  You cannot control your cough with suppressant medicines and you are losing sleep.  You develop pain that is getting worse or pain that is not controlled with pain medicines.  You have a fever.  You have unexplained weight loss.  You have night sweats. SEEK IMMEDIATE MEDICAL CARE IF:  You cough up blood.  You have difficulty breathing.  Your heartbeat is very fast.   This information is not intended to replace advice given to you by your health care provider. Make sure you discuss any questions you have with your health care provider.   Document Released: 04/02/2011 Document Revised: 06/25/2015 Document Reviewed: 12/11/2014 Elsevier Interactive Patient Education 2016 Elsevier Inc.    Prostatitis The prostate gland is about the size and shape of a walnut.  It is located just below your bladder. It produces one of the components of semen, which is made up of sperm and the fluids that help nourish and transport it out from the testicles. Prostatitis is inflammation of the prostate gland.  There are four types of prostatitis:  Acute bacterial prostatitis. This is the least common type of prostatitis. It starts quickly and usually is associated with a bladder infection, high fever, and shaking  chills. It can occur at any age.  Chronic bacterial prostatitis. This is a persistent bacterial infection in the prostate. It usually develops from repeated acute bacterial prostatitis or acute bacterial prostatitis that was not properly treated. It can occur in men of any age but is most common in middle-aged men whose prostate has begun to enlarge. The symptoms are not as severe as those in acute bacterial prostatitis. Discomfort in the part of your body that is in front of your rectum and below your scrotum (perineum), lower abdomen, or in the head of your penis (glans) may represent your primary discomfort.  Chronic prostatitis (nonbacterial). This is the most common type of prostatitis. It is inflammation of the prostate gland that is not caused by a bacterial infection. The cause is unknown and may be associated with a viral infection or autoimmune disorder.  Prostatodynia (pelvic floor disorder). This is associated with increased muscular tone in the pelvis surrounding the prostate. CAUSES The causes of bacterial prostatitis are bacterial infection. The causes of the other types of prostatitis are unknown.  SYMPTOMS  Symptoms can vary depending upon the type of prostatitis that exists. There can also be overlap in symptoms. Possible symptoms for each type of prostatitis are listed below. Acute Bacterial Prostatitis  Painful urination.  Fever or chills.  Muscle or joint pains.  Low back pain.  Low abdominal pain.  Inability to empty bladder completely. Chronic Bacterial Prostatitis, Chronic Nonbacterial Prostatitis, and Prostatodynia  Sudden urge to urinate.  Frequent urination.  Difficulty starting urine stream.  Weak urine stream.  Discharge from the urethra.  Dribbling after urination.  Rectal pain.  Pain in the testicles, penis, or tip of the penis.  Pain in the perineum.  Problems with sexual function.  Painful ejaculation.  Bloody semen. DIAGNOSIS  In  order to diagnose prostatitis, your health care provider will ask about your symptoms. One or more urine samples will be taken and tested (urinalysis). If the urinalysis result is negative for bacteria, your health care provider may use a finger to feel your prostate (digital rectal exam). This exam helps your health care provider determine if your prostate is swollen and tender. It will also produce a specimen of semen that can be analyzed. TREATMENT  Treatment for prostatitis depends on the cause. If a bacterial infection is the cause, it can be treated with antibiotic medicine. In cases of chronic bacterial prostatitis, the use of antibiotics for up to 1 month or 6 weeks may be necessary. Your health care provider may instruct you to take sitz baths to help relieve pain. A sitz bath is a bath of hot water in which your hips and buttocks are under water. This relaxes the pelvic floor muscles and often helps to relieve the pressure on your prostate. HOME CARE INSTRUCTIONS   Take all medicines as directed by your health care provider.  Take sitz baths as directed by your health care provider. SEEK MEDICAL CARE IF:   Your symptoms get worse, not better.  You have a fever. [place]  CARE IF:   You have chills.  You feel nauseous or vomit.  You feel lightheaded or faint.  You are unable to urinate.  You have blood or blood clots in your urine. MAKE SURE YOU:  Understand these instructions.  Will watch your condition.  Will get help right away if you are not doing well or get worse.   This information is not intended to replace advice given to you by your health care provider. Make sure you discuss any questions you have with your health care provider.   Document Released: 10/01/2000 Document Revised: 10/25/2014 Document Reviewed: 04/23/2013 Elsevier Interactive Patient Education 2016 [employer].   IF you received an x-ray today, you will receive an invoice from  [facility]. Please contact [facility] at [phone] with questions or concerns regarding your invoice.   IF you received labwork today, you will receive an invoice from [employer]. Please contact [facility] at [phone] with questions or concerns regarding your invoice.   Our billing staff will not be able to assist you with questions regarding bills from these companies.  You will be contacted with the lab results as soon as they are available. The fastest way to get your results is to activate your My Chart account. Instructions are located on the last page of this paperwork. If you have not heard from [place] regarding the results in 2 weeks, please contact this office.

## 2016-08-30 ENCOUNTER — Encounter: Payer: Self-pay | Admitting: Family Medicine

## 2016-09-05 ENCOUNTER — Other Ambulatory Visit: Payer: Self-pay | Admitting: Family Medicine

## 2016-09-05 DIAGNOSIS — H5789 Other specified disorders of eye and adnexa: Secondary | ICD-10-CM

## 2016-09-06 ENCOUNTER — Ambulatory Visit (INDEPENDENT_AMBULATORY_CARE_PROVIDER_SITE_OTHER): Payer: Medicare Other | Admitting: Family Medicine

## 2016-09-06 VITALS — BP 122/70 | HR 71 | Temp 97.8°F | Resp 18 | Ht 66.0 in | Wt 168.6 lb

## 2016-09-06 DIAGNOSIS — N410 Acute prostatitis: Secondary | ICD-10-CM | POA: Diagnosis not present

## 2016-09-06 DIAGNOSIS — Z87898 Personal history of other specified conditions: Secondary | ICD-10-CM | POA: Diagnosis not present

## 2016-09-06 DIAGNOSIS — N3091 Cystitis, unspecified with hematuria: Secondary | ICD-10-CM | POA: Diagnosis not present

## 2016-09-06 LAB — POCT URINALYSIS DIP (MANUAL ENTRY)
BILIRUBIN UA: NEGATIVE
GLUCOSE UA: NEGATIVE
Ketones, POC UA: NEGATIVE
NITRITE UA: NEGATIVE
Protein Ur, POC: NEGATIVE
Spec Grav, UA: 1.015
UROBILINOGEN UA: 0.2
pH, UA: 5.5

## 2016-09-06 LAB — CBC WITH DIFFERENTIAL/PLATELET
BASOS ABS: 52 {cells}/uL (ref 0–200)
BASOS PCT: 1 %
EOS ABS: 52 {cells}/uL (ref 15–500)
Eosinophils Relative: 1 %
HEMATOCRIT: 36.6 % — AB (ref 38.5–50.0)
Hemoglobin: 11.5 g/dL — ABNORMAL LOW (ref 13.2–17.1)
LYMPHS PCT: 37 %
Lymphs Abs: 1924 cells/uL (ref 850–3900)
MCH: 25.7 pg — AB (ref 27.0–33.0)
MCHC: 31.4 g/dL — ABNORMAL LOW (ref 32.0–36.0)
MCV: 81.7 fL (ref 80.0–100.0)
MONO ABS: 312 {cells}/uL (ref 200–950)
MONOS PCT: 6 %
MPV: 8.8 fL (ref 7.5–12.5)
Neutro Abs: 2860 cells/uL (ref 1500–7800)
Neutrophils Relative %: 55 %
PLATELETS: 358 10*3/uL (ref 140–400)
RBC: 4.48 MIL/uL (ref 4.20–5.80)
RDW: 14.9 % (ref 11.0–15.0)
WBC: 5.2 10*3/uL (ref 3.8–10.8)

## 2016-09-06 LAB — POC MICROSCOPIC URINALYSIS (UMFC): Mucus: ABSENT

## 2016-09-06 LAB — PSA: PSA: 15.5 ng/mL — ABNORMAL HIGH (ref <=4.0)

## 2016-09-06 MED ORDER — SULFAMETHOXAZOLE-TRIMETHOPRIM 800-160 MG PO TABS: 1.0000 | ORAL_TABLET | Freq: Two times a day (BID) | ORAL | 0 refills | Status: DC

## 2016-09-06 NOTE — Progress Notes (Signed)
Subjective:  By signing my name below, I, Essence Howell, attest that this documentation has been prepared under the direction and in the presence of [name], MD Electronically Signed: Ladene Price, ED Scribe 09/06/2016 at 9:09 AM.   Patient ID: Tony Price, male    DOB: 06/25/1941, 75 y.o.   MRN: [id number]  Chief Complaint  Patient presents with  . Follow-up   HPI  HPI Comments: Tony Price is a 75 y.o. male who presents to the [facility] for a follow-up for prostatitis. See prior office visits. Initially treated 10/14 hemorrhagic cystitis started on Cipro then Septra due to culture results. Started on Septra on 10/18. Pt was off of antibiotics for 2 days on 11/3 but had recent fevers. Was restarted on Septra DS BID. Recheck on 11/11, persistent elevated PSA. Was continued on Septra for 1 additional week. Here for follow-up.  Pt has been compliant with his antibiotics and states that he completed it yesterday. He states that he is still eating but has noticed a decreased appetite since starting the antibiotics. Pt denies fever, dysuria and any GI symptoms. Pt is scheduled to see his urologist on 12/6. His PSA has been around 7-8.  Patient Active Problem List   Diagnosis Date Noted  . Diabetic retinopathy ([place]) 03/31/2016  . Onychomycosis 02/25/2016  . UTI (lower urinary tract infection) 11/17/2014  . DM (diabetes mellitus), type 2 ([place]) 11/17/2014  . HTN (hypertension) 11/17/2014  . Sepsis ([place]) 11/17/2014   Past Medical History:  Diagnosis Date  . Diabetes mellitus   . Hypercholesterolemia   . Hypertension   . Kidney stones    Past Surgical History:  Procedure Laterality Date  . CATARACT EXTRACTION    . EYE SURGERY    . PROSTATE BIOPSY    . ROTATOR CUFF REPAIR     Right   No Known Allergies Prior to Admission medications   Medication Sig Start Date End Date Taking? Authorizing Provider  amLODipine (NORVASC) 5 MG tablet TAKE 1  TABLET BY MOUTH DAILY 08/30/16  Yes [name], MD  Ascorbic Acid (VITAMIN C) 1000 MG tablet Take 1,000 mg by mouth daily.   Yes Historical Provider, MD  aspirin 81 MG chewable tablet Chew by mouth daily.   Yes Historical Provider, MD  atorvastatin (LIPITOR) 20 MG tablet TAKE 1 TABLET (20 MG TOTAL) BY MOUTH DAILY. 07/28/16  Yes [name], MD  Blood Glucose Monitoring Suppl (ONE TOUCH ULTRA SYSTEM KIT) w/Device KIT Test blood sugar once daily. Dx: E11.9 07/01/16  Yes [name], MD  erythromycin [facility]) ophthalmic ointment Place 1 application into the left eye 3 (three) times daily. 08/28/16  Yes [name], MD  glimepiride (AMARYL) 2 MG tablet TAKE 1 TABLET BY MOUTH TWICE A DAY 08/30/16  Yes [name], MD  glucose blood test strip Test blood sugar once daily. Dx: E11.9 07/01/16  Yes [name], MD  Lancets [facility] ULTRASOFT) lancets Test blood sugar once daily. Dx: E11.9 07/01/16  Yes [name], MD  latanoprost (XALATAN) 0.005 % ophthalmic solution Place 1 drop into both eyes at bedtime.   Yes Historical Provider, MD  quinapril (ACCUPRIL) 20 MG tablet Take 1 tablet (20 mg total) by mouth daily. 11/10/15  Yes [name], MD  sulfamethoxazole-trimethoprim (BACTRIM DS,SEPTRA DS) 800-160 MG tablet Take 1 tablet by mouth 2 (two) times daily. 08/28/16  Yes [name], MD   Social History   Social  History  . Marital status: Divorced    Spouse name: [name]  . Number of children: [name]  . Years of education: [name]   Occupational History  . Not on file.   Social History Main Topics  . Smoking status: Former Smoker    Years: 2.00  . Smokeless tobacco: Never Used  . Alcohol use No  . Drug use: No  . Sexual activity: Not on file   Other Topics Concern  . Not on file   Social History Narrative  . No narrative on file   Review of Systems  Constitutional: Negative for fever.  Gastrointestinal: Negative.   Genitourinary: Negative for dysuria.     Vitals:   09/06/16 0850  BP: 122/70  Pulse: 71  Resp: 18  Temp: 97.8 F (36.6 C)  TempSrc: Oral  SpO2: 98%  Weight: 168 lb 9.6 oz (76.5 kg)  Height: '5\' 6"'  (1.676 m)      Objective:   Physical Exam  Constitutional: He is oriented to person, place, and time. He appears well-developed and well-nourished. No distress.  HENT:  Head: Normocephalic and atraumatic.  Eyes: EOM are normal. Left eye exhibits no exudate. Left conjunctiva is injected (very minimal).  Neck: Neck supple. No tracheal deviation present.  Cardiovascular: Normal rate, regular rhythm and normal heart sounds.   Pulmonary/Chest: Effort normal and breath sounds normal. No respiratory distress.  Abdominal: Soft. He exhibits no distension. There is no tenderness. There is no CVA tenderness.  Genitourinary:  Genitourinary Comments: Prostate is slightly enlarged but nontender.   Musculoskeletal: Normal range of motion.  Neurological: He is alert and oriented to person, place, and time.  Skin: Skin is warm and dry.  Psychiatric: He has a normal mood and affect. His behavior is normal.  Nursing note and vitals reviewed.  Results for orders placed or performed in visit on 09/06/16  POCT urinalysis dipstick  Result Value Ref Range   Color, UA yellow yellow   Clarity, UA clear clear   Glucose, UA negative negative   Bilirubin, UA negative negative   Ketones, POC UA negative negative   Spec Grav, UA 1.015    Blood, UA trace-lysed (A) negative   pH, UA 5.5    Protein Ur, POC negative negative   Urobilinogen, UA 0.2    Nitrite, UA Negative Negative   Leukocytes, UA large (3+) (A) Negative  POCT Microscopic Urinalysis ([facility])  Result Value Ref Range   WBC,UR,HPF,POC Moderate (A) None WBC/hpf   RBC,UR,HPF,POC None None RBC/hpf   Bacteria None None, Too numerous to count   Mucus Absent Absent   Epithelial Cells, UR Per Microscopy Few (A) None, Too numerous to count cells/hpf      Assessment & Plan:    Tony Price is a 75 y.o. male Prostatitis, acute - Plan: CBC with Differential/Platelet, PSA, POCT urinalysis dipstick, POCT Microscopic Urinalysis ([facility]), Urine culture, PSA  History of elevated PSA  Hemorrhagic cystitis - Plan: sulfamethoxazole-trimethoprim (BACTRIM DS,SEPTRA DS) 800-160 MG tablet  Acute prostatitis - Plan: sulfamethoxazole-trimethoprim (BACTRIM DS,SEPTRA DS) 800-160 MG tablet  Persistent prostatitis. nontender on exam, but still with WBC on U/a.   - refilled septra DS BID for 1 more week. Check urine cx.   - check PSA.  If normal can d/c septra.   -if PSA elevaated - lab only visit in 5 days to determine if further course of Septra - may need to discuss with urology.   Meds ordered this encounter  Medications  . sulfamethoxazole-trimethoprim (BACTRIM  DS,SEPTRA DS) 800-160 MG tablet    Sig: Take 1 tablet by mouth 2 (two) times daily.    Dispense:  14 tablet    Refill:  0   Patient Instructions    The urine test still shows some possible infection. I will recheck your PSA, and if it is normal, we may be able to stop antibiotics, but at this point I did refill an antibiotic for 1 additional week. At the end of that week, return for lab only visit to recheck your PSA. If still elevated at that time, one further week of antibiotics may still be needed.  Return to the clinic or go to the nearest emergency room if any of your symptoms worsen or new symptoms occur.   Prostatitis Prostatitis is swelling or inflammation of the prostate gland. The prostate is a walnut-sized gland that is involved in the production of semen. It is located below a man's bladder, in front of the rectum. There are four types of prostatitis:  Chronic nonbacterial prostatitis. This is the most common type of prostatitis. It may be associated with a viral infection or autoimmune disorder.  Acute bacterial prostatitis. This is the least common type of prostatitis. It starts quickly and is usually  associated with a bladder infection, high fever, and shaking chills. It can occur at any age.  Chronic bacterial prostatitis. This type usually results from acute bacterial prostatitis that happens repeatedly (is recurrent) or has not been treated properly. It can occur in men of any age but is most common among middle-aged men whose prostate has begun to get larger. The symptoms are not as severe as symptoms caused by acute bacterial prostatitis.  Prostatodynia or chronic pelvic pain syndrome (CPPS). This type is also called pelvic floor disorder. It is associated with increased muscular tone in the pelvis surrounding the prostate. What are the causes? Bacterial prostatitis is caused by infection from bacteria. Chronic nonbacterial prostatitis may be caused by:  Urinary tract infections (UTIs).  Nerve damage.  A response by the body's disease-fighting system (autoimmune response).  Chemicals in the urine. The causes of the other types of prostatitis are usually not known. What are the signs or symptoms? Symptoms of this condition vary depending upon the type of prostatitis. If you have acute bacterial prostatitis, you may experience:  Urinary symptoms, such as:  Painful urination.  Burning during urination.  Frequent and sudden urges to urinate.  Inability to start urinating.  A weak or interrupted stream of urine.  Vomiting.  Nausea.  Fever.  Chills.  Inability to empty the bladder completely.  Pain in the:  Muscles or joints.  Lower back.  Lower abdomen. If you have any of the other types of prostatitis, you may experience:  Urinary symptoms, such as:  Sudden urges to urinate.  Frequent urination.  Difficulty starting urination.  Weak urine stream.  Dribbling after urination.  Discharge from the urethra. The urethra is a tube that opens at the end of the penis.  Pain in the:  Testicles.  Penis or tip of the penis.  Rectum.  Area in front of  the rectum and below the scrotum (perineum).  Problems with sexual function.  Painful ejaculation.  Bloody semen. How is this diagnosed? This condition may be diagnosed based on:  A physical and medical exam.  Your symptoms.  A urine test to check for bacteria.  An exam in which a health care provider uses a finger to feel the prostate (digital rectal exam).  A test of a sample of semen.  Blood tests.  Ultrasound.  Removal of prostate tissue to be examined under a microscope (biopsy).  Tests to check how your body handles urine (urodynamic tests).  A test to look inside your bladder or urethra (cystoscopy). How is this treated? Treatment for this condition depends on the type of prostatitis. Treatment may involve:  Medicines to relieve pain or inflammation.  Medicines to help relax your muscles.  Physical therapy.  Heat therapy.  Techniques to help you control certain body functions (biofeedback).  Relaxation exercises.  Antibiotic medicine, if your condition is caused by bacteria.  Warm water baths (sitz baths). Sitz baths help with relaxing your pelvic floor muscles, which helps to relieve pressure on the prostate. Follow these instructions at home:  Take over-the-counter and prescription medicines only as told by your health care provider.  If you were prescribed an antibiotic, take it as told by your health care provider. Do not stop taking the antibiotic even if you start to feel better.  If physical therapy, biofeedback, or relaxation exercises were prescribed, do exercises as instructed.  Take sitz baths as directed by your health care provider. For a sitz bath, sit in warm water that is deep enough to cover your hips and buttocks.  Keep all follow-up visits as told by your health care provider. This is important. Contact a health care provider if:  Your symptoms get worse.  You have a fever. Get help right away if:  You have chills.  You feel  nauseous.  You vomit.  You feel light-headed or feel like you are going to faint.  You are unable to urinate.  You have blood or blood clots in your urine. This information is not intended to replace advice given to you by your health care provider. Make sure you discuss any questions you have with your health care provider. Document Released: 10/01/2000 Document Revised: 06/24/2016 Document Reviewed: 06/24/2016 Elsevier Interactive Patient Education  2017 [employer].    IF you received an x-ray today, you will receive an invoice from [facility]. Please contact [facility] at [phone] with questions or concerns regarding your invoice.   IF you received labwork today, you will receive an invoice from [employer]. Please contact [facility] at [phone] with questions or concerns regarding your invoice.   Our billing staff will not be able to assist you with questions regarding bills from these companies.  You will be contacted with the lab results as soon as they are available. The fastest way to get your results is to activate your My Chart account. Instructions are located on the last page of this paperwork. If you have not heard from [place] regarding the results in 2 weeks, please contact this office.        I personally performed the services described in this documentation, which was scribed in my presence. The recorded information has been reviewed and considered, and addended by me as needed.   Signed,   [name], MD [facility].  09/06/16 11:00 PM

## 2016-09-06 NOTE — Patient Instructions (Addendum)
The urine test still shows some possible infection. I will recheck your PSA, and if it is normal, we may be able to stop antibiotics, but at this point I did refill an antibiotic for 1 additional week. At the end of that week, return for lab only visit to recheck your PSA. If still elevated at that time, one further week of antibiotics may still be needed.  Return to the clinic or go to the nearest emergency room if any of your symptoms worsen or new symptoms occur.   Prostatitis Prostatitis is swelling or inflammation of the prostate gland. The prostate is a walnut-sized gland that is involved in the production of semen. It is located below a man's bladder, in front of the rectum. There are four types of prostatitis:  Chronic nonbacterial prostatitis. This is the most common type of prostatitis. It may be associated with a viral infection or autoimmune disorder.  Acute bacterial prostatitis. This is the least common type of prostatitis. It starts quickly and is usually associated with a bladder infection, high fever, and shaking chills. It can occur at any age.  Chronic bacterial prostatitis. This type usually results from acute bacterial prostatitis that happens repeatedly (is recurrent) or has not been treated properly. It can occur in men of any age but is most common among middle-aged men whose prostate has begun to get larger. The symptoms are not as severe as symptoms caused by acute bacterial prostatitis.  Prostatodynia or chronic pelvic pain syndrome (CPPS). This type is also called pelvic floor disorder. It is associated with increased muscular tone in the pelvis surrounding the prostate. What are the causes? Bacterial prostatitis is caused by infection from bacteria. Chronic nonbacterial prostatitis may be caused by:  Urinary tract infections (UTIs).  Nerve damage.  A response by the body's disease-fighting system (autoimmune response).  Chemicals in the urine. The causes of  the other types of prostatitis are usually not known. What are the signs or symptoms? Symptoms of this condition vary depending upon the type of prostatitis. If you have acute bacterial prostatitis, you may experience:  Urinary symptoms, such as:  Painful urination.  Burning during urination.  Frequent and sudden urges to urinate.  Inability to start urinating.  A weak or interrupted stream of urine.  Vomiting.  Nausea.  Fever.  Chills.  Inability to empty the bladder completely.  Pain in the:  Muscles or joints.  Lower back.  Lower abdomen. If you have any of the other types of prostatitis, you may experience:  Urinary symptoms, such as:  Sudden urges to urinate.  Frequent urination.  Difficulty starting urination.  Weak urine stream.  Dribbling after urination.  Discharge from the urethra. The urethra is a tube that opens at the end of the penis.  Pain in the:  Testicles.  Penis or tip of the penis.  Rectum.  Area in front of the rectum and below the scrotum (perineum).  Problems with sexual function.  Painful ejaculation.  Bloody semen. How is this diagnosed? This condition may be diagnosed based on:  A physical and medical exam.  Your symptoms.  A urine test to check for bacteria.  An exam in which a health care provider uses a finger to feel the prostate (digital rectal exam).  A test of a sample of semen.  Blood tests.  Ultrasound.  Removal of prostate tissue to be examined under a microscope (biopsy).  Tests to check how your body handles urine (urodynamic tests).  A test  to look inside your bladder or urethra (cystoscopy). How is this treated? Treatment for this condition depends on the type of prostatitis. Treatment may involve:  Medicines to relieve pain or inflammation.  Medicines to help relax your muscles.  Physical therapy.  Heat therapy.  Techniques to help you control certain body functions  (biofeedback).  Relaxation exercises.  Antibiotic medicine, if your condition is caused by bacteria.  Warm water baths (sitz baths). Sitz baths help with relaxing your pelvic floor muscles, which helps to relieve pressure on the prostate. Follow these instructions at home:  Take over-the-counter and prescription medicines only as told by your health care provider.  If you were prescribed an antibiotic, take it as told by your health care provider. Do not stop taking the antibiotic even if you start to feel better.  If physical therapy, biofeedback, or relaxation exercises were prescribed, do exercises as instructed.  Take sitz baths as directed by your health care provider. For a sitz bath, sit in warm water that is deep enough to cover your hips and buttocks.  Keep all follow-up visits as told by your health care provider. This is important. Contact a health care provider if:  Your symptoms get worse.  You have a fever. Get help right away if:  You have chills.  You feel nauseous.  You vomit.  You feel light-headed or feel like you are going to faint.  You are unable to urinate.  You have blood or blood clots in your urine. This information is not intended to replace advice given to you by your health care provider. Make sure you discuss any questions you have with your health care provider. Document Released: 10/01/2000 Document Revised: 06/24/2016 Document Reviewed: 06/24/2016 Elsevier Interactive Patient Education  2017 [employer].    IF you received an x-ray today, you will receive an invoice from [facility]. Please contact [facility] at [phone] with questions or concerns regarding your invoice.   IF you received labwork today, you will receive an invoice from [employer]. Please contact [facility] at [phone] with questions or concerns regarding your invoice.   Our billing staff will not be able to assist you  with questions regarding bills from these companies.  You will be contacted with the lab results as soon as they are available. The fastest way to get your results is to activate your My Chart account. Instructions are located on the last page of this paperwork. If you have not heard from [place] regarding the results in 2 weeks, please contact this office.

## 2016-09-07 LAB — URINE CULTURE: Organism ID, Bacteria: NO GROWTH

## 2016-09-11 ENCOUNTER — Other Ambulatory Visit (INDEPENDENT_AMBULATORY_CARE_PROVIDER_SITE_OTHER): Payer: Medicare Other | Admitting: Family Medicine

## 2016-09-11 DIAGNOSIS — N3091 Cystitis, unspecified with hematuria: Secondary | ICD-10-CM | POA: Diagnosis not present

## 2016-09-11 DIAGNOSIS — Z87898 Personal history of other specified conditions: Secondary | ICD-10-CM | POA: Diagnosis not present

## 2016-09-11 DIAGNOSIS — N410 Acute prostatitis: Secondary | ICD-10-CM

## 2016-09-11 LAB — PSA: PSA: 13.7 ng/mL — ABNORMAL HIGH (ref <=4.0)

## 2016-09-11 NOTE — Progress Notes (Signed)
Lab only visit 

## 2016-09-13 MED ORDER — SULFAMETHOXAZOLE-TRIMETHOPRIM 800-160 MG PO TABS: 1.0000 | ORAL_TABLET | Freq: Two times a day (BID) | ORAL | 0 refills | Status: DC

## 2016-09-13 NOTE — Addendum Note (Signed)
Addended by: [name] R on: 09/13/2016 06:45 PM   Modules accepted: Orders

## 2016-09-21 ENCOUNTER — Ambulatory Visit: Payer: Medicare Other | Admitting: Podiatry

## 2016-09-22 DIAGNOSIS — R972 Elevated prostate specific antigen [PSA]: Secondary | ICD-10-CM | POA: Diagnosis not present

## 2016-09-22 DIAGNOSIS — N400 Enlarged prostate without lower urinary tract symptoms: Secondary | ICD-10-CM | POA: Diagnosis not present

## 2016-10-23 ENCOUNTER — Other Ambulatory Visit: Payer: Self-pay | Admitting: Family Medicine

## 2016-10-23 DIAGNOSIS — I10 Essential (primary) hypertension: Secondary | ICD-10-CM

## 2016-10-24 ENCOUNTER — Other Ambulatory Visit: Payer: Self-pay | Admitting: Family Medicine

## 2016-10-24 DIAGNOSIS — I10 Essential (primary) hypertension: Secondary | ICD-10-CM

## 2016-10-24 DIAGNOSIS — E785 Hyperlipidemia, unspecified: Secondary | ICD-10-CM

## 2016-10-24 MED ORDER — QUINAPRIL HCL 20 MG PO TABS: 20.0000 mg | ORAL_TABLET | Freq: Every day | ORAL | 0 refills | Status: DC

## 2016-10-24 NOTE — Telephone Encounter (Signed)
Last exam 05/2016  Last lipid 10/2015 Last acute 08/2016 needs ov

## 2016-10-26 IMAGING — DX DG RIBS W/ CHEST 3+V*L*
3 series · 3 of 3 positions shown · non-contrast
Comparison: None.

CLINICAL DATA: Intermittent left lateral rib pain for the past 5
months. No known injury.

EXAM:
LEFT RIBS AND CHEST - 3+ VIEW

[chest pa]
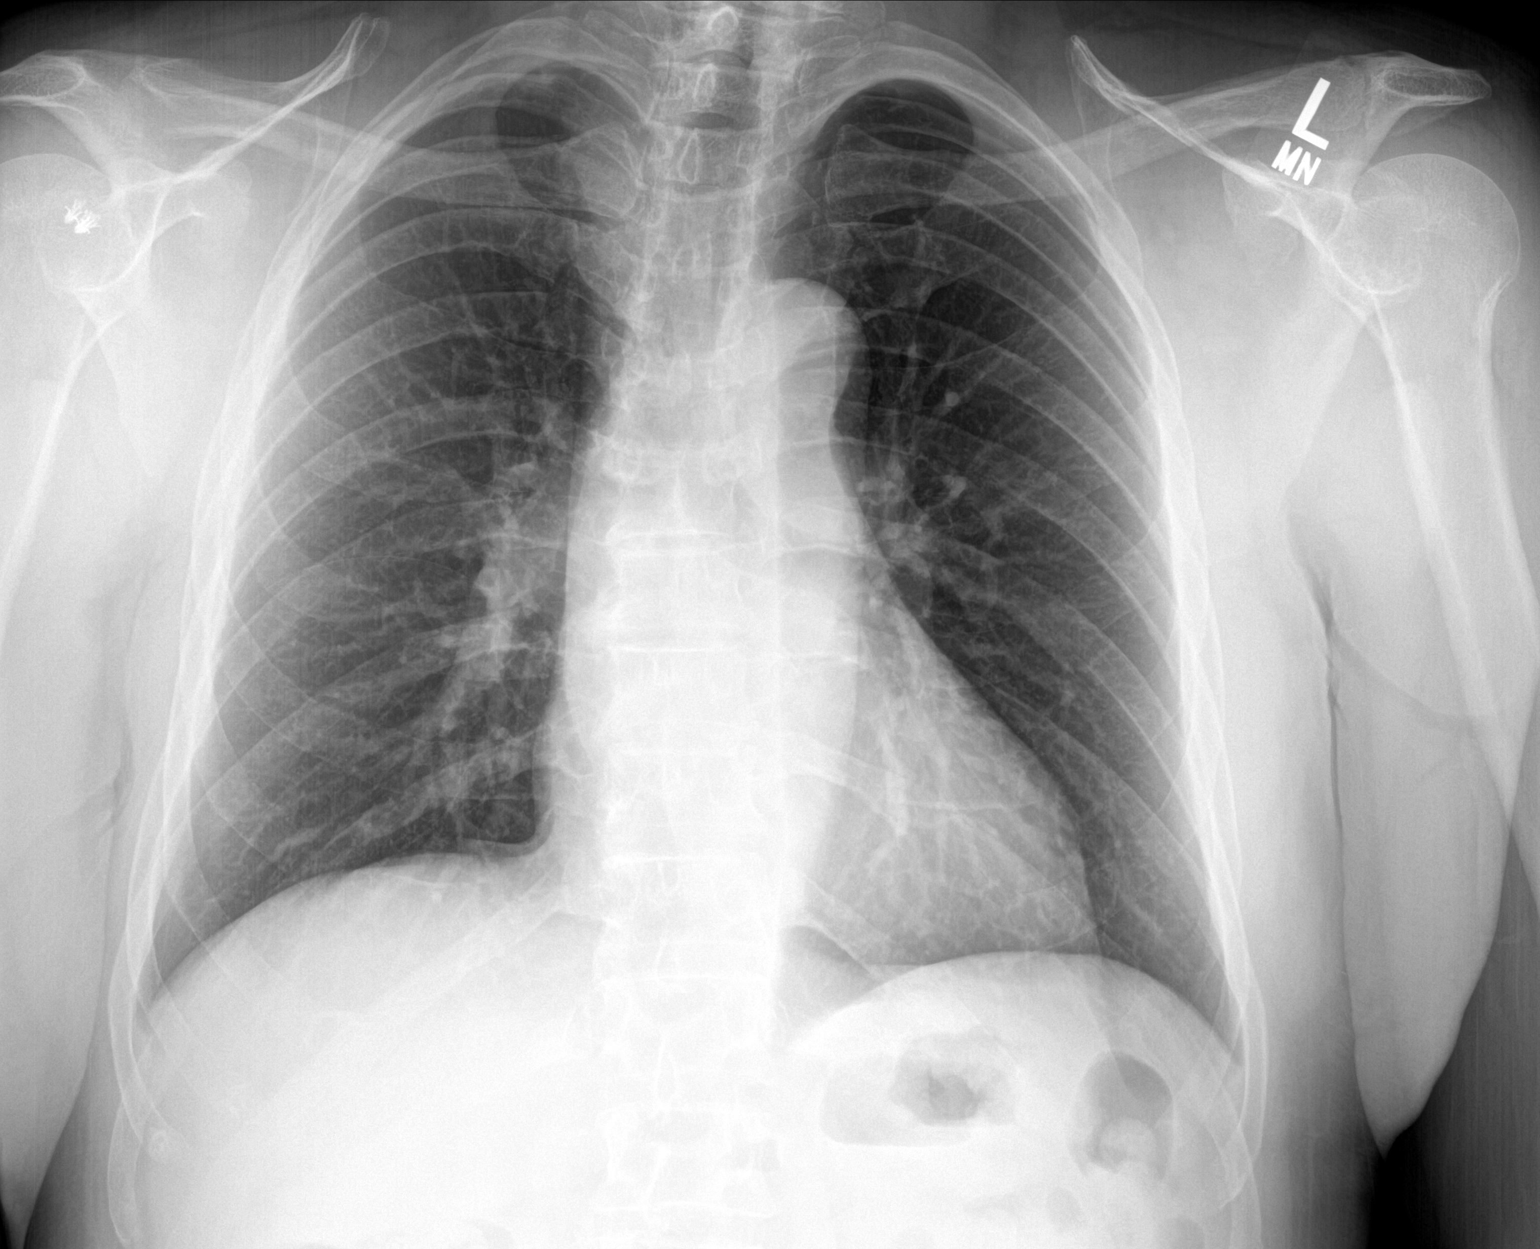

[rib pa]
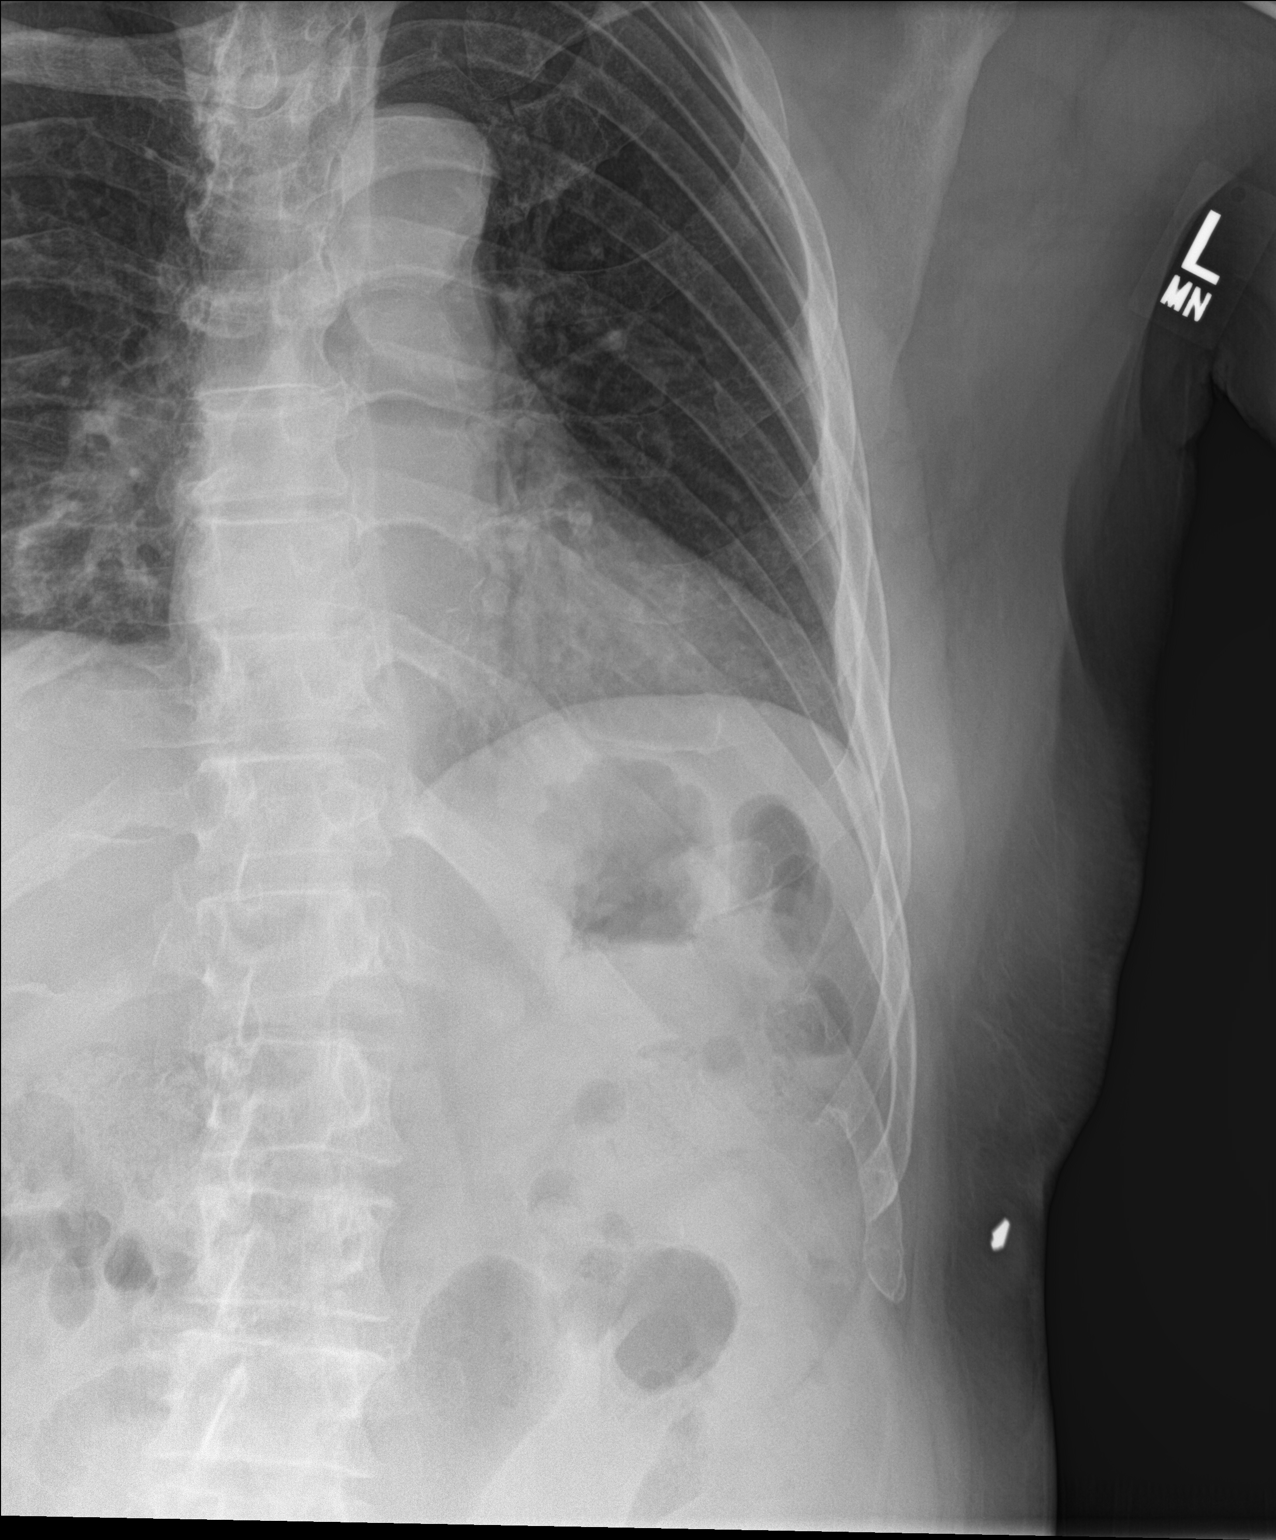

[rib obl]
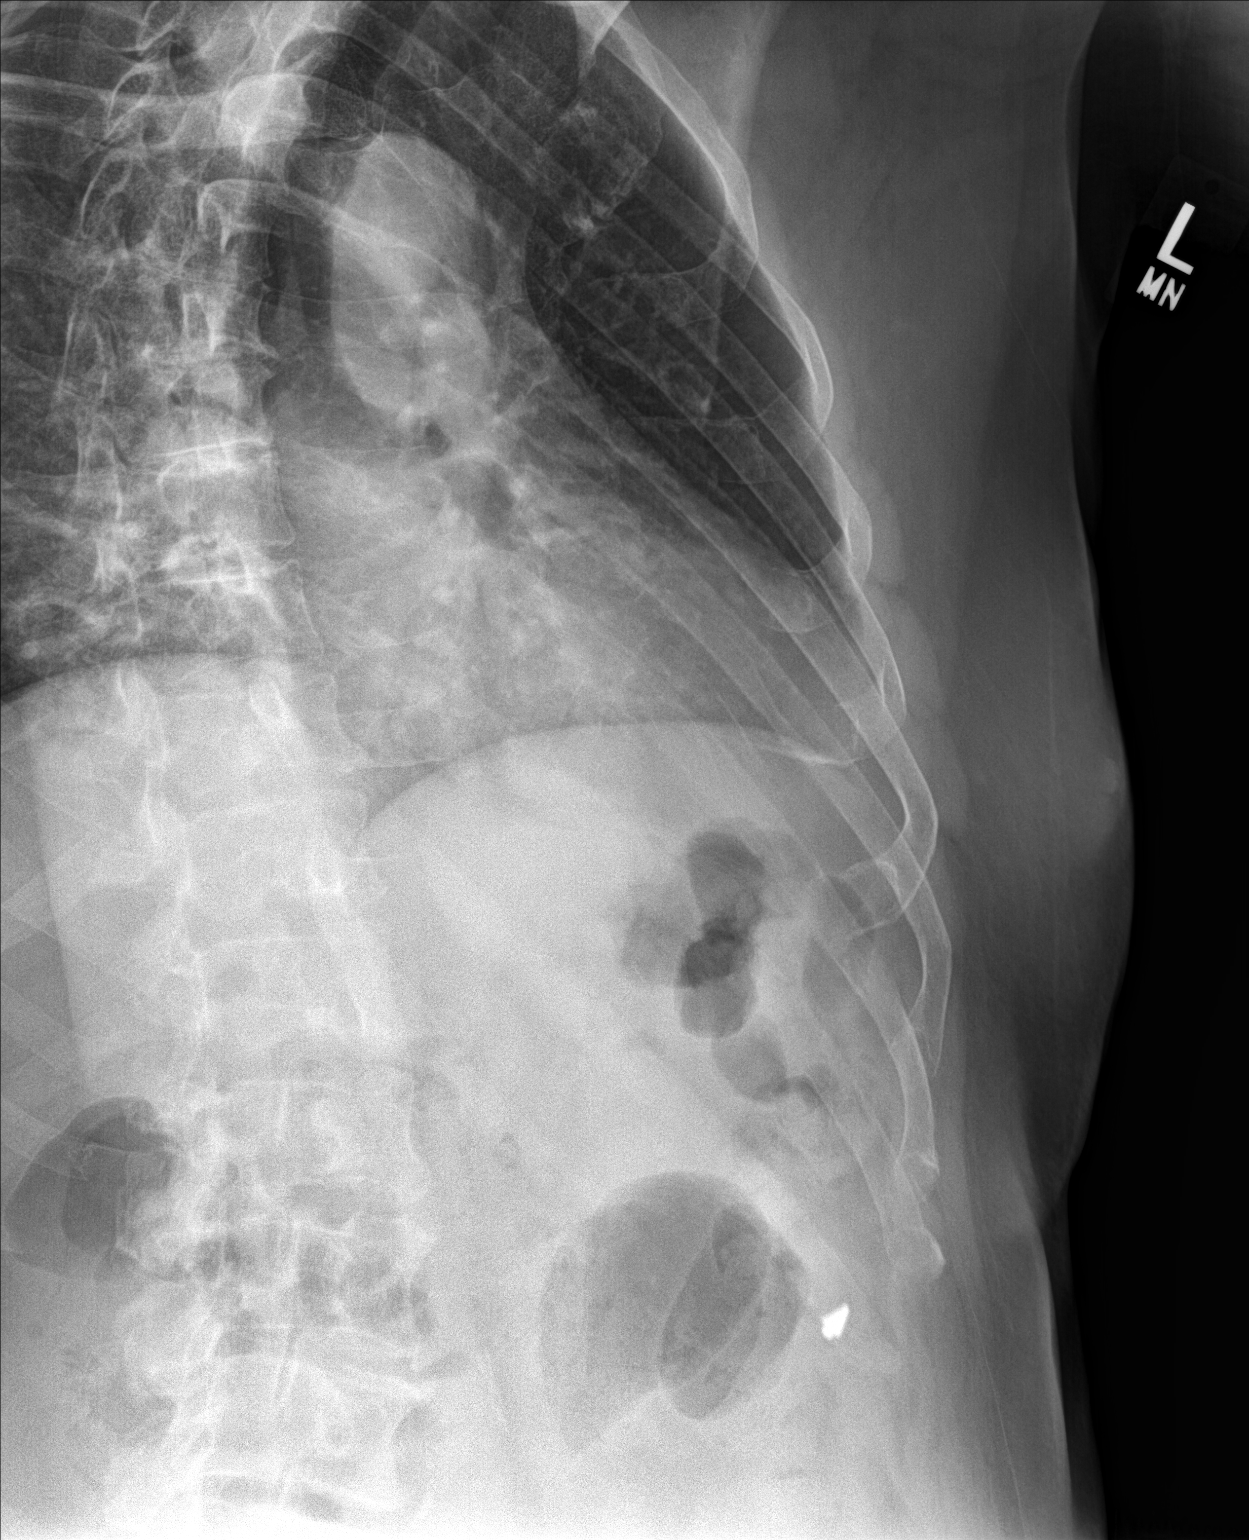

[3 of 3 positions shown; findings below may reference images not displayed]

FINDINGS: Normal cardiac silhouette with mild tortuosity of the thoracic
aorta. No focal airspace opacities. No pleural effusion or
pneumothorax. No evidence of edema.

No displaced left-sided rib fractures with special attention paid to
area demarcated by the radiopaque arrow. Regional soft tissues
appear normal. Post right-sided rotator cuff repair.
IMPRESSION: No acute cardiopulmonary disease. Specifically, no displaced
left-sided rib fractures with special attention paid to the area
demarcated by the radiopaque arrow.

## 2016-11-10 DIAGNOSIS — H04123 Dry eye syndrome of bilateral lacrimal glands: Secondary | ICD-10-CM | POA: Diagnosis not present

## 2016-11-10 DIAGNOSIS — H02403 Unspecified ptosis of bilateral eyelids: Secondary | ICD-10-CM | POA: Diagnosis not present

## 2016-11-18 DIAGNOSIS — Z86010 Personal history of colonic polyps: Secondary | ICD-10-CM | POA: Diagnosis not present

## 2016-11-21 ENCOUNTER — Other Ambulatory Visit: Payer: Self-pay | Admitting: Family Medicine

## 2016-11-21 DIAGNOSIS — E785 Hyperlipidemia, unspecified: Secondary | ICD-10-CM

## 2016-11-23 ENCOUNTER — Ambulatory Visit: Payer: Medicare Other | Admitting: Podiatry

## 2016-12-01 ENCOUNTER — Other Ambulatory Visit: Payer: Self-pay | Admitting: Family Medicine

## 2016-12-01 DIAGNOSIS — I10 Essential (primary) hypertension: Secondary | ICD-10-CM

## 2016-12-01 DIAGNOSIS — E119 Type 2 diabetes mellitus without complications: Secondary | ICD-10-CM

## 2016-12-13 ENCOUNTER — Ambulatory Visit (INDEPENDENT_AMBULATORY_CARE_PROVIDER_SITE_OTHER): Payer: Medicare Other | Admitting: Family Medicine

## 2016-12-13 VITALS — BP 165/61 | HR 69 | Temp 98.2°F | Ht 66.0 in | Wt 175.6 lb

## 2016-12-13 DIAGNOSIS — L259 Unspecified contact dermatitis, unspecified cause: Secondary | ICD-10-CM

## 2016-12-13 DIAGNOSIS — R21 Rash and other nonspecific skin eruption: Secondary | ICD-10-CM

## 2016-12-13 DIAGNOSIS — E785 Hyperlipidemia, unspecified: Secondary | ICD-10-CM | POA: Diagnosis not present

## 2016-12-13 DIAGNOSIS — I10 Essential (primary) hypertension: Secondary | ICD-10-CM

## 2016-12-13 DIAGNOSIS — E119 Type 2 diabetes mellitus without complications: Secondary | ICD-10-CM | POA: Diagnosis not present

## 2016-12-13 MED ORDER — ATORVASTATIN CALCIUM 20 MG PO TABS: 20.0000 mg | ORAL_TABLET | Freq: Every day | ORAL | 1 refills | Status: DC

## 2016-12-13 MED ORDER — TRIAMCINOLONE ACETONIDE 0.1 % EX CREA: 1.0000 "application " | TOPICAL_CREAM | Freq: Two times a day (BID) | CUTANEOUS | 0 refills | Status: DC

## 2016-12-13 MED ORDER — GLIMEPIRIDE 2 MG PO TABS: 2.0000 mg | ORAL_TABLET | Freq: Two times a day (BID) | ORAL | 1 refills | Status: DC

## 2016-12-13 MED ORDER — AMLODIPINE BESYLATE 5 MG PO TABS: 5.0000 mg | ORAL_TABLET | Freq: Every day | ORAL | 1 refills | Status: DC

## 2016-12-13 MED ORDER — QUINAPRIL HCL 20 MG PO TABS: 20.0000 mg | ORAL_TABLET | Freq: Every day | ORAL | 1 refills | Status: DC

## 2016-12-13 NOTE — Progress Notes (Signed)
By signing my name below, I, Mesha Guinyard, attest that this documentation has been prepared under the direction and in the presence of [name], MD.  Electronically Signed: Verlee Monte, Medical Scribe. 12/13/16. 2:39 PM.  Subjective:    Patient ID: [name], male    DOB: 30-Nov-1940, 76 y.o.   MRN: [id number]  HPI Chief Complaint  Patient presents with  . Rash    X 1 week left side    HPI Comments: Tywaun Hiltner is a 76 y.o. male who presents to the [facility] complaining of rash onset 2 weeks ago. Reports the rash is itchy and located on his left flank. Pt used A&D cream, and peroxide with some drying of his rash but not complete relief of his sxs. Pt has been wearing a back brace. Denies water blisters forming, and pain from the concerned area. Pt is not fasting.  HTN: Controlled at last visit in Nov. Pt is complaint with his medication. Pt doesn't check his bp at home. Denies chest pain, light-headedness, difficult breathing, or other acute sxs. Lab Results  Component Value Date   CREATININE 1.19 (H) 08/20/2016   BP Readings from Last 3 Encounters:  12/13/16 (!) 165/61  09/06/16 122/70  08/28/16 (!) 110/58   DM: Pt is compliant with glimepiride BID; in the morning and at night. His blood sugar ranges bewteen 110-120; with the highest 2 readings 140 and 190. Pt's next ophthalmologist appt is March 2018 and his next dentist appt is next week. Lab Results  Component Value Date   HGBA1C 6.7 (H) 06/17/2016   Lab Results  Component Value Date   MICROALBUR 1.1 08/04/2015   HLD: Pt is compliant with lipitor. Denies experiencing myalgias, or any negative side effects from his medication. Lab Results  Component Value Date   CHOL 144 11/10/2015   HDL 64 11/10/2015   LDLCALC 71 11/10/2015   TRIG 46 11/10/2015   CHOLHDL 2.3 11/10/2015   Lab Results  Component Value Date   ALT 8 (L) 11/10/2015   AST 12 11/10/2015   ALKPHOS 75  11/10/2015   BILITOT 0.5 11/10/2015   Cancer Screening: Colon CA: Pt has a colonoscopy scheduled 01/05/17. Prostate CA: Denies trouble urinating  Eye: Pt no longer uses his eye ointment.   Patient Active Problem List   Diagnosis Date Noted  . Diabetic retinopathy ([place]) 03/31/2016  . Onychomycosis 02/25/2016  . UTI (lower urinary tract infection) 11/17/2014  . DM (diabetes mellitus), type 2 ([place]) 11/17/2014  . HTN (hypertension) 11/17/2014  . Sepsis ([place]) 11/17/2014   Past Medical History:  Diagnosis Date  . Diabetes mellitus   . Hypercholesterolemia   . Hypertension   . Kidney stones    Past Surgical History:  Procedure Laterality Date  . CATARACT EXTRACTION    . EYE SURGERY    . PROSTATE BIOPSY    . ROTATOR CUFF REPAIR     Right   No Known Allergies Prior to Admission medications   Medication Sig Start Date End Date Taking? Authorizing Provider  amLODipine (NORVASC) 5 MG tablet TAKE 1 TABLET BY MOUTH DAILY 12/02/16  Yes [name], MD  Ascorbic Acid (VITAMIN C) 1000 MG tablet Take 1,000 mg by mouth daily.   Yes Historical Provider, MD  aspirin 81 MG chewable tablet Chew by mouth daily.   Yes Historical Provider, MD  atorvastatin (LIPITOR) 20 MG tablet TAKE 1 TABLET (20 MG TOTAL) BY MOUTH DAILY. 11/21/16  Yes [name],  MD  Blood Glucose Monitoring Suppl (ONE TOUCH ULTRA SYSTEM KIT) w/Device KIT Test blood sugar once daily. Dx: E11.9 07/01/16  Yes [name], MD  erythromycin [facility]) ophthalmic ointment Place 1 application into the left eye 3 (three) times daily. 08/28/16  Yes [name], MD  glimepiride (AMARYL) 2 MG tablet TAKE 1 TABLET BY MOUTH TWICE A DAY 12/02/16  Yes [name], MD  glucose blood test strip Test blood sugar once daily. Dx: E11.9 07/01/16  Yes [name], MD  Lancets [facility] ULTRASOFT) lancets Test blood sugar once daily. Dx: E11.9 07/01/16  Yes [name], MD  latanoprost (XALATAN) 0.005 % ophthalmic solution  Place 1 drop into both eyes at bedtime.   Yes Historical Provider, MD  quinapril (ACCUPRIL) 20 MG tablet Take 1 tablet (20 mg total) by mouth daily. 10/24/16  Yes [name], MD  sulfamethoxazole-trimethoprim (BACTRIM DS,SEPTRA DS) 800-160 MG tablet Take 1 tablet by mouth 2 (two) times daily. Patient not taking: Reported on 12/13/2016 09/13/16   [name], MD   Social History   Social History  . Marital status: Divorced    Spouse name: [name]  . Number of children: [name]  . Years of education: [name]   Occupational History  . Not on file.   Social History Main Topics  . Smoking status: Former Smoker    Years: 2.00  . Smokeless tobacco: Never Used  . Alcohol use No  . Drug use: No  . Sexual activity: Not on file   Other Topics Concern  . Not on file   Social History Narrative  . No narrative on file   Review of Systems  Constitutional: Negative for fatigue and unexpected weight change.  Eyes: Negative for visual disturbance.  Respiratory: Negative for cough, chest tightness and shortness of breath.   Cardiovascular: Negative for chest pain, palpitations and leg swelling.  Gastrointestinal: Negative for abdominal pain.  Genitourinary: Negative for difficulty urinating.  Skin: Positive for rash. Negative for pallor and wound.  Neurological: Negative for dizziness, light-headedness and headaches.   Objective:  Physical Exam  Constitutional: He is oriented to person, place, and time. He appears well-developed and well-nourished. No distress.  HENT:  Head: Normocephalic and atraumatic.  Eyes: Conjunctivae and EOM are normal. Pupils are equal, round, and reactive to light.  Neck: Neck supple. No JVD present. Carotid bruit is not present.  Cardiovascular: Normal rate, regular rhythm and normal heart sounds.  Exam reveals no friction rub.   No murmur heard. Pulmonary/Chest: Effort normal and breath sounds normal. No respiratory distress. He has no wheezes. He has no rales.    Musculoskeletal: He exhibits no edema.  Neurological: He is alert and oriented to person, place, and time.  Skin: Skin is warm and dry.  Few patches on the left lower abdomen Slightly thickened skin Mildly hyperpigmented No vesicles There was no rash on the right side of his abdomen or back  Psychiatric: He has a normal mood and affect. His behavior is normal.  Nursing note and vitals reviewed.   Vitals:   12/13/16 1355 12/13/16 1430  BP: (!) 162/68 (!) 165/61  Pulse: 73 69  Temp: 98.2 F (36.8 C)   TempSrc: Oral   SpO2: 98%   Weight: 175 lb 9.6 oz (79.7 kg)   Height: '5\' 6"'  (1.676 m)   Body mass index is 28.34 kg/m. Assessment & Plan:   Kaston Faughn is a 76 y.o. male Controlled type 2 diabetes mellitus  without complication, without long-term current use of insulin (HCC) - Plan: Microalbumin, urine, Hemoglobin A1C, glimepiride (AMARYL) 2 MG tablet  -Tolerating current dose of Amaryl without new side effects. Check A1c, urine microalbumin.  -keep optho, dentist visits. Contact dermatitis, unspecified contact dermatitis type, unspecified trigger - Plan: triamcinolone cream (KENALOG) 0.1 % Rash - Plan: triamcinolone cream (KENALOG) 0.1 %  -Rash likely contact dermatitis based on appearance after use of brace, but with unilateral appearance differential included zoster. However as this area is improving, at this time topical triamcinolone can be used if needed for itching or irritation, RTC precautions if not continuing to improve.  Essential hypertension - Plan: Comprehensive metabolic panel, Lipid panel, quinapril (ACCUPRIL) 20 MG tablet, amLODipine (NORVASC) 5 MG tablet  -Elevated readings in office, advised to increase amlodipine to 10 mg daily if remaining elevated at home. Continue same dose of quinapril for now.   Hyperlipidemia, unspecified hyperlipidemia type - Plan: Comprehensive metabolic panel, Lipid panel, atorvastatin (LIPITOR) 20 MG tablet  Check CMP, lipid panel  at fasting lab only visit, continue Lipitor same dose as tolerated currently.   Meds ordered this encounter  Medications  . triamcinolone cream (KENALOG) 0.1 %    Sig: Apply 1 application topically 2 (two) times daily.    Dispense:  30 g    Refill:  0  . glimepiride (AMARYL) 2 MG tablet    Sig: Take 1 tablet (2 mg total) by mouth 2 (two) times daily.    Dispense:  180 tablet    Refill:  1  . quinapril (ACCUPRIL) 20 MG tablet    Sig: Take 1 tablet (20 mg total) by mouth daily.    Dispense:  90 tablet    Refill:  1  . amLODipine (NORVASC) 5 MG tablet    Sig: Take 1 tablet (5 mg total) by mouth daily.    Dispense:  90 tablet    Refill:  1  . atorvastatin (LIPITOR) 20 MG tablet    Sig: Take 1 tablet (20 mg total) by mouth daily.    Dispense:  90 tablet    Refill:  1   Patient Instructions   Return within the next 1 week for fasting blood work. Keep appointments with eye doctor and dentist.  Keep a record of your blood pressures outside of the office and if remaining over 140/90 - increase amlodipine to 2 pills each day.  Let me know if you end up making that change. If any lightheadedness or dizziness at that increased dose, return to the previous dose and follow-up with me to discuss other changes.  Recheck in 3 months.    IF you received an x-ray today, you will receive an invoice from [facility]. Please contact [facility] at [phone] with questions or concerns regarding your invoice.   IF you received labwork today, you will receive an invoice from [place]. Please contact [facility] at [phone] with questions or concerns regarding your invoice.   Our billing staff will not be able to assist you with questions regarding bills from these companies.  You will be contacted with the lab results as soon as they are available. The fastest way to get your results is to activate your My Chart account. Instructions are located on the last page of this  paperwork. If you have not heard from [place] regarding the results in 2 weeks, please contact this office.       I personally performed the services described in this documentation, which was scribed  in my presence. The recorded information has been reviewed and considered for accuracy and completeness, addended by me as needed, and agree with information above.  Signed,   [name], MD [facility].  12/15/16 5:03 PM

## 2016-12-13 NOTE — Patient Instructions (Addendum)
Return within the next 1 week for fasting blood work. Keep appointments with eye doctor and dentist.  Keep a record of your blood pressures outside of the office and if remaining over 140/90 - increase amlodipine to 2 pills each day.  Let me know if you end up making that change. If any lightheadedness or dizziness at that increased dose, return to the previous dose and follow-up with me to discuss other changes.  Recheck in 3 months.    IF you received an x-ray today, you will receive an invoice from [facility]. Please contact [facility] at [phone] with questions or concerns regarding your invoice.   IF you received labwork today, you will receive an invoice from [place]. Please contact [facility] at [phone] with questions or concerns regarding your invoice.   Our billing staff will not be able to assist you with questions regarding bills from these companies.  You will be contacted with the lab results as soon as they are available. The fastest way to get your results is to activate your My Chart account. Instructions are located on the last page of this paperwork. If you have not heard from [place] regarding the results in 2 weeks, please contact this office.

## 2016-12-14 ENCOUNTER — Other Ambulatory Visit (INDEPENDENT_AMBULATORY_CARE_PROVIDER_SITE_OTHER): Payer: Medicare Other

## 2016-12-14 DIAGNOSIS — I10 Essential (primary) hypertension: Secondary | ICD-10-CM | POA: Diagnosis not present

## 2016-12-14 DIAGNOSIS — E119 Type 2 diabetes mellitus without complications: Secondary | ICD-10-CM

## 2016-12-14 DIAGNOSIS — E785 Hyperlipidemia, unspecified: Secondary | ICD-10-CM

## 2016-12-15 LAB — COMPREHENSIVE METABOLIC PANEL
ALBUMIN: 4 g/dL (ref 3.5–4.8)
ALK PHOS: 80 IU/L (ref 39–117)
ALT: 10 IU/L (ref 0–44)
AST: 13 IU/L (ref 0–40)
Albumin/Globulin Ratio: 1.4 (ref 1.2–2.2)
BILIRUBIN TOTAL: 0.4 mg/dL (ref 0.0–1.2)
BUN / CREAT RATIO: 12 (ref 10–24)
BUN: 13 mg/dL (ref 8–27)
CHLORIDE: 107 mmol/L — AB (ref 96–106)
CO2: 24 mmol/L (ref 18–29)
Calcium: 8.8 mg/dL (ref 8.6–10.2)
Creatinine, Ser: 1.12 mg/dL (ref 0.76–1.27)
GFR calc Af Amer: 74 mL/min/{1.73_m2} (ref >59)
GFR calc non Af Amer: 64 mL/min/{1.73_m2} (ref >59)
GLOBULIN, TOTAL: 2.9 g/dL (ref 1.5–4.5)
Glucose: 117 mg/dL — ABNORMAL HIGH (ref 65–99)
POTASSIUM: 4.5 mmol/L (ref 3.5–5.2)
SODIUM: 145 mmol/L — AB (ref 134–144)
Total Protein: 6.9 g/dL (ref 6.0–8.5)

## 2016-12-15 LAB — HEMOGLOBIN A1C
Est. average glucose Bld gHb Est-mCnc: 134 mg/dL
HEMOGLOBIN A1C: 6.3 % — AB (ref 4.8–5.6)

## 2016-12-27 ENCOUNTER — Other Ambulatory Visit: Payer: Self-pay | Admitting: Family Medicine

## 2016-12-27 DIAGNOSIS — I10 Essential (primary) hypertension: Secondary | ICD-10-CM

## 2016-12-30 DIAGNOSIS — R972 Elevated prostate specific antigen [PSA]: Secondary | ICD-10-CM | POA: Diagnosis not present

## 2017-01-05 DIAGNOSIS — Z86010 Personal history of colonic polyps: Secondary | ICD-10-CM | POA: Diagnosis not present

## 2017-01-05 DIAGNOSIS — K640 First degree hemorrhoids: Secondary | ICD-10-CM | POA: Diagnosis not present

## 2017-01-05 DIAGNOSIS — D126 Benign neoplasm of colon, unspecified: Secondary | ICD-10-CM | POA: Diagnosis not present

## 2017-01-11 DIAGNOSIS — D126 Benign neoplasm of colon, unspecified: Secondary | ICD-10-CM | POA: Diagnosis not present

## 2017-01-26 ENCOUNTER — Other Ambulatory Visit: Payer: Self-pay | Admitting: Family Medicine

## 2017-01-26 DIAGNOSIS — R21 Rash and other nonspecific skin eruption: Secondary | ICD-10-CM

## 2017-01-26 DIAGNOSIS — L259 Unspecified contact dermatitis, unspecified cause: Secondary | ICD-10-CM

## 2017-01-28 NOTE — Telephone Encounter (Signed)
Refilled, see mychart message

## 2017-01-28 NOTE — Telephone Encounter (Signed)
I did not see a plan for refills for this medication. Do you want to refill?

## 2017-02-14 DIAGNOSIS — H402222 Chronic angle-closure glaucoma, left eye, moderate stage: Secondary | ICD-10-CM | POA: Diagnosis not present

## 2017-02-14 DIAGNOSIS — H402211 Chronic angle-closure glaucoma, right eye, mild stage: Secondary | ICD-10-CM | POA: Diagnosis not present

## 2017-02-14 DIAGNOSIS — E113293 Type 2 diabetes mellitus with mild nonproliferative diabetic retinopathy without macular edema, bilateral: Secondary | ICD-10-CM | POA: Diagnosis not present

## 2017-02-14 DIAGNOSIS — H35033 Hypertensive retinopathy, bilateral: Secondary | ICD-10-CM | POA: Diagnosis not present

## 2017-02-14 LAB — HM DIABETES EYE EXAM

## 2017-03-21 DIAGNOSIS — R972 Elevated prostate specific antigen [PSA]: Secondary | ICD-10-CM | POA: Diagnosis not present

## 2017-03-30 DIAGNOSIS — R972 Elevated prostate specific antigen [PSA]: Secondary | ICD-10-CM | POA: Diagnosis not present

## 2017-03-30 DIAGNOSIS — N400 Enlarged prostate without lower urinary tract symptoms: Secondary | ICD-10-CM | POA: Diagnosis not present

## 2017-03-30 NOTE — Telephone Encounter (Signed)
error 

## 2017-04-08 ENCOUNTER — Encounter: Payer: Self-pay | Admitting: Family Medicine

## 2017-04-08 ENCOUNTER — Ambulatory Visit (INDEPENDENT_AMBULATORY_CARE_PROVIDER_SITE_OTHER): Payer: Medicare Other | Admitting: Family Medicine

## 2017-04-08 VITALS — BP 138/88 | HR 58 | Temp 98.0°F | Resp 18 | Ht 64.96 in | Wt 172.0 lb

## 2017-04-08 DIAGNOSIS — L259 Unspecified contact dermatitis, unspecified cause: Secondary | ICD-10-CM | POA: Diagnosis not present

## 2017-04-08 DIAGNOSIS — E785 Hyperlipidemia, unspecified: Secondary | ICD-10-CM

## 2017-04-08 DIAGNOSIS — I10 Essential (primary) hypertension: Secondary | ICD-10-CM

## 2017-04-08 DIAGNOSIS — E11319 Type 2 diabetes mellitus with unspecified diabetic retinopathy without macular edema: Secondary | ICD-10-CM | POA: Diagnosis not present

## 2017-04-08 DIAGNOSIS — E119 Type 2 diabetes mellitus without complications: Secondary | ICD-10-CM | POA: Diagnosis not present

## 2017-04-08 MED ORDER — ATORVASTATIN CALCIUM 20 MG PO TABS: 20.0000 mg | ORAL_TABLET | Freq: Every day | ORAL | 1 refills | Status: DC

## 2017-04-08 MED ORDER — AMLODIPINE BESYLATE 5 MG PO TABS: 5.0000 mg | ORAL_TABLET | Freq: Every day | ORAL | 1 refills | Status: DC

## 2017-04-08 MED ORDER — QUINAPRIL HCL 20 MG PO TABS: 20.0000 mg | ORAL_TABLET | Freq: Every day | ORAL | 1 refills | Status: DC

## 2017-04-08 MED ORDER — GLIMEPIRIDE 2 MG PO TABS: 2.0000 mg | ORAL_TABLET | Freq: Two times a day (BID) | ORAL | 1 refills | Status: DC

## 2017-04-08 MED ORDER — TRIAMCINOLONE ACETONIDE 0.1 % EX CREA: 1.0000 "application " | TOPICAL_CREAM | Freq: Two times a day (BID) | CUTANEOUS | 0 refills | Status: DC

## 2017-04-08 NOTE — Patient Instructions (Addendum)
  Return tomorrow for fasting labs. Steroid cream to the irritated areas up to twice per day. If those are not improving within the next 2 weeks or worsening sooner, please return for recheck.     IF you received an x-ray today, you will receive an invoice from [facility]. Please contact [facility] at [phone] with questions or concerns regarding your invoice.   IF you received labwork today, you will receive an invoice from [place]. Please contact [facility] at [phone] with questions or concerns regarding your invoice.   Our billing staff will not be able to assist you with questions regarding bills from these companies.  You will be contacted with the lab results as soon as they are available. The fastest way to get your results is to activate your My Chart account. Instructions are located on the last page of this paperwork. If you have not heard from [place] regarding the results in 2 weeks, please contact this office.

## 2017-04-08 NOTE — Progress Notes (Signed)
Subjective:  By signing my name below, I, [name], attest that this documentation has been prepared under the direction and in the presence of [name], MD. Electronically Signed: [name], [place]. 04/08/2017 , 4:12 PM .  Patient was seen in Room 11 .   Patient ID: Tony Price, male    DOB: Feb 07, 1941, 76 y.o.   MRN: [id number] Chief Complaint  Patient presents with  . Diabetes    follow-up  . Hypertension    follow-up   HPI Tony Price is a 76 y.o. male Here for follow on DM and HTN. He ate a muffin and drank some tea at noon (4 hours ago); he agrees to return for fasting labs tomorrow morning.   Rash He mentions dry itchy skin over his bilateral knees and in his groin that started about a week ago. He states it gets worse scratching the area. He believes it could be from starch in his pants.   DM He takes glimepiride 6m BID.   Lab Results  Component Value Date   HGBA1C 6.3 (H) 12/14/2016   Lab Results  Component Value Date   MICROALBUR 1.1 08/04/2015   He does have diabetic retinopathy; last saw his eye doctor a month ago.  He is followed by dentist every 6 months.   HTN He takes amlodipine 555mQD and accupril 2016mD.   Lab Results  Component Value Date   CREATININE 1.12 12/14/2016    Hyperlipidemia Lab Results  Component Value Date   CHOL 144 11/10/2015   HDL 64 11/10/2015   LDLCALC 71 11/10/2015   TRIG 46 11/10/2015   CHOLHDL 2.3 11/10/2015   Lab Results  Component Value Date   ALT 10 12/14/2016   AST 13 12/14/2016   ALKPHOS 80 12/14/2016   BILITOT 0.4 12/14/2016     Patient Active Problem List   Diagnosis Date Noted  . Diabetic retinopathy (HCCNewaygo6/14/2017  . Onychomycosis 02/25/2016  . UTI (lower urinary tract infection) 11/17/2014  . DM (diabetes mellitus), type 2 (HCCCementon1/31/2016  . HTN (hypertension) 11/17/2014  . Sepsis (HCCCloquet1/31/2016   Past Medical History:  Diagnosis Date  . Diabetes mellitus   .  Hypercholesterolemia   . Hypertension   . Kidney stones    Past Surgical History:  Procedure Laterality Date  . CATARACT EXTRACTION    . EYE SURGERY    . PROSTATE BIOPSY    . ROTATOR CUFF REPAIR     Right   No Known Allergies Prior to Admission medications   Medication Sig Start Date End Date Taking? Authorizing Provider  amLODipine (NORVASC) 5 MG tablet Take 1 tablet (5 mg total) by mouth daily. 12/13/16  Yes GreWendie AgresteD  Ascorbic Acid (VITAMIN C) 1000 MG tablet Take 1,000 mg by mouth daily.   Yes [provider]  aspirin 81 MG chewable tablet Chew by mouth daily.   Yes [provider]  atorvastatin (LIPITOR) 20 MG tablet Take 1 tablet (20 mg total) by mouth daily. 12/13/16  Yes GreWendie AgresteD  Blood Glucose Monitoring Suppl (ONE TOUCH ULTRA SYSTEM KIT) w/Device KIT Test blood sugar once daily. Dx: E11.9 07/01/16  Yes GreWendie AgresteD  glimepiride (AMARYL) 2 MG tablet Take 1 tablet (2 mg total) by mouth 2 (two) times daily. 12/13/16  Yes GreWendie AgresteD  glucose blood test strip Test blood sugar once daily. Dx: E11.9 07/01/16  Yes GreWendie AgresteD  Lancets ([facility]) lancets  Test blood sugar once daily. Dx: E11.9 07/01/16  Yes [name], MD  latanoprost (XALATAN) 0.005 % ophthalmic solution Place 1 drop into both eyes at bedtime.   Yes [provider]  quinapril (ACCUPRIL) 20 MG tablet Take 1 tablet (20 mg total) by mouth daily. 12/13/16  Yes [name], MD  triamcinolone cream (KENALOG) 0.1 % APPLY 1 APPLICATION TOPICALLY 2 (TWO) TIMES DAILY. 01/28/17  Yes [name], MD   Social History   Social History  . Marital status: Divorced    Spouse name: [name]  . Number of children: [name]  . Years of education: [name]   Occupational History  . Not on file.   Social History Main Topics  . Smoking status: Former Smoker    Years: 2.00  . Smokeless tobacco: Never Used  . Alcohol use No  . Drug use: No  .  Sexual activity: Not on file   Other Topics Concern  . Not on file   Social History Narrative  . No narrative on file   Review of Systems  Constitutional: Negative for fatigue and unexpected weight change.  Eyes: Negative for visual disturbance.  Respiratory: Negative for cough, chest tightness and shortness of breath.   Cardiovascular: Negative for chest pain, palpitations and leg swelling.  Gastrointestinal: Negative for abdominal pain and blood in stool.  Skin: Positive for rash.  Neurological: Negative for dizziness, light-headedness and headaches.       Objective:   Physical Exam  Constitutional: He is oriented to person, place, and time. He appears well-developed and well-nourished.  HENT:  Head: Normocephalic and atraumatic.  Eyes: EOM are normal. Pupils are equal, round, and reactive to light.  Neck: No JVD present. Carotid bruit is not present.  Cardiovascular: Normal rate, regular rhythm and normal heart sounds.   No murmur heard. Pulmonary/Chest: Effort normal and breath sounds normal. No respiratory distress. He has no rales.  Musculoskeletal: He exhibits no edema.  Neurological: He is alert and oriented to person, place, and time.  Skin: Skin is warm and dry.  Left knee: a couple slightly hyperpigmented excoriated areas over his left knee Right knee: similar dry skin with hyperpigmented areas over the right knee He also has hyperpigmented excoriated areas over his lower abdominal wall  Psychiatric: He has a normal mood and affect.  Vitals reviewed.   Vitals:   04/08/17 1508  BP: 138/88  Pulse: (!) 58  Resp: 18  Temp: 98 F (36.7 C)  TempSrc: Oral  SpO2: 97%  Weight: 172 lb (78 kg)  Height: 5' 4.96" (1.65 m)      Assessment & Plan:   Tony Price is a 76 y.o. male Type 2 diabetes mellitus with retinopathy, without long-term current use of insulin, macular edema presence unspecified, unspecified laterality, unspecified retinopathy severity ([place]) -  Plan: Hemoglobin A1c, Microalbumin, urine, Controlled type 2 diabetes mellitus without complication, without long-term current use of insulin (HCC) - Plan: glimepiride (AMARYL) 2 MG tablet  - Check A1c, microalbumin.  -Continue Amaryl at current dose.  Contact dermatitis, unspecified contact dermatitis type, unspecified trigger - Plan: triamcinolone cream (KENALOG) 0.1 %  - Appears to be contact dermatitis. Triamcinolone cream topical twice a day when necessary, RTC precautions if persistent, worsening, or spreading.  Essential hypertension - Plan: quinapril (ACCUPRIL) 20 MG tablet, amLODipine (NORVASC) 5 MG tablet  - Stable, continue same dose of Norvasc and Accupril. Labs pending on fasting lab visit in the morning.  Hyperlipidemia, unspecified hyperlipidemia type -  Plan: Lipid panel, Comprehensive metabolic panel, atorvastatin (LIPITOR) 20 MG tablet  -Tolerating Lipitor, continue same dose, fasting lab visit in the morning     Meds ordered this encounter  Medications  . triamcinolone cream (KENALOG) 0.1 %    Sig: Apply 1 application topically 2 (two) times daily.    Dispense:  30 g    Refill:  0  . quinapril (ACCUPRIL) 20 MG tablet    Sig: Take 1 tablet (20 mg total) by mouth daily.    Dispense:  90 tablet    Refill:  1  . glimepiride (AMARYL) 2 MG tablet    Sig: Take 1 tablet (2 mg total) by mouth 2 (two) times daily.    Dispense:  180 tablet    Refill:  1  . atorvastatin (LIPITOR) 20 MG tablet    Sig: Take 1 tablet (20 mg total) by mouth daily.    Dispense:  90 tablet    Refill:  1  . amLODipine (NORVASC) 5 MG tablet    Sig: Take 1 tablet (5 mg total) by mouth daily.    Dispense:  90 tablet    Refill:  1   Patient Instructions    Return tomorrow for fasting labs. Steroid cream to the irritated areas up to twice per day. If those are not improving within the next 2 weeks or worsening sooner, please return for recheck.     IF you received an x-ray today, you will  receive an invoice from [facility]. Please contact [facility] at [phone] with questions or concerns regarding your invoice.   IF you received labwork today, you will receive an invoice from [facility]. Please contact [facility] at [phone] with questions or concerns regarding your invoice.   Our billing staff will not be able to assist you with questions regarding bills from these companies.  You will be contacted with the lab results as soon as they are available. The fastest way to get your results is to activate your My Chart account. Instructions are located on the last page of this paperwork. If you have not heard from [place] regarding the results in 2 weeks, please contact this office.       I personally performed the services described in this documentation, which was scribed in my presence. The recorded information has been reviewed and considered for accuracy and completeness, addended by me as needed, and agree with information above.  Signed,   [name], MD [facility].  04/10/17 10:19 PM

## 2017-04-09 ENCOUNTER — Ambulatory Visit (INDEPENDENT_AMBULATORY_CARE_PROVIDER_SITE_OTHER): Payer: Medicare Other | Admitting: Family Medicine

## 2017-04-09 DIAGNOSIS — E11319 Type 2 diabetes mellitus with unspecified diabetic retinopathy without macular edema: Secondary | ICD-10-CM

## 2017-04-09 DIAGNOSIS — E785 Hyperlipidemia, unspecified: Secondary | ICD-10-CM | POA: Diagnosis not present

## 2017-04-10 LAB — COMPREHENSIVE METABOLIC PANEL
ALK PHOS: 82 IU/L (ref 39–117)
ALT: 9 IU/L (ref 0–44)
AST: 17 IU/L (ref 0–40)
Albumin/Globulin Ratio: 1.4 (ref 1.2–2.2)
Albumin: 4 g/dL (ref 3.5–4.8)
BILIRUBIN TOTAL: 0.6 mg/dL (ref 0.0–1.2)
BUN / CREAT RATIO: 10 (ref 10–24)
BUN: 11 mg/dL (ref 8–27)
CHLORIDE: 103 mmol/L (ref 96–106)
CO2: 24 mmol/L (ref 20–29)
Calcium: 8.9 mg/dL (ref 8.6–10.2)
Creatinine, Ser: 1.05 mg/dL (ref 0.76–1.27)
GFR calc Af Amer: 80 mL/min/{1.73_m2} (ref >59)
GFR calc non Af Amer: 69 mL/min/{1.73_m2} (ref >59)
GLUCOSE: 139 mg/dL — AB (ref 65–99)
Globulin, Total: 2.9 g/dL (ref 1.5–4.5)
Potassium: 4.4 mmol/L (ref 3.5–5.2)
SODIUM: 142 mmol/L (ref 134–144)
Total Protein: 6.9 g/dL (ref 6.0–8.5)

## 2017-04-10 LAB — LIPID PANEL
CHOL/HDL RATIO: 2.5 ratio (ref 0.0–5.0)
Cholesterol, Total: 158 mg/dL (ref 100–199)
HDL: 63 mg/dL (ref >39)
LDL CALC: 82 mg/dL (ref 0–99)
Triglycerides: 66 mg/dL (ref 0–149)
VLDL CHOLESTEROL CAL: 13 mg/dL (ref 5–40)

## 2017-04-10 LAB — HEMOGLOBIN A1C
ESTIMATED AVERAGE GLUCOSE: 154 mg/dL
Hgb A1c MFr Bld: 7 % — ABNORMAL HIGH (ref 4.8–5.6)

## 2017-04-12 NOTE — Progress Notes (Signed)
Labs only

## 2017-04-18 ENCOUNTER — Other Ambulatory Visit: Payer: Self-pay | Admitting: Family Medicine

## 2017-04-18 DIAGNOSIS — L259 Unspecified contact dermatitis, unspecified cause: Secondary | ICD-10-CM

## 2017-04-19 NOTE — Telephone Encounter (Signed)
Req for refill Kenalog cream.  Dr. Carlota Raspberry states in note to RTC if not improved in 2 weeks.  Note to pt to make appt.  Refill refused.

## 2017-04-21 ENCOUNTER — Other Ambulatory Visit: Payer: Self-pay | Admitting: Family Medicine

## 2017-04-21 DIAGNOSIS — L259 Unspecified contact dermatitis, unspecified cause: Secondary | ICD-10-CM

## 2017-04-22 NOTE — Telephone Encounter (Signed)
Please advise.Tony KitchenMarland KitchenSaw in note that if not better to recheck in two weeks..the patient was seen on 04/08/2017

## 2017-04-25 NOTE — Telephone Encounter (Signed)
Refilled once more, but follow-up if not improved with this course. Sooner if area is worsening.

## 2017-05-20 ENCOUNTER — Other Ambulatory Visit: Payer: Self-pay | Admitting: Family Medicine

## 2017-05-20 DIAGNOSIS — L259 Unspecified contact dermatitis, unspecified cause: Secondary | ICD-10-CM

## 2017-06-06 ENCOUNTER — Ambulatory Visit (INDEPENDENT_AMBULATORY_CARE_PROVIDER_SITE_OTHER): Payer: Medicare Other | Admitting: Family Medicine

## 2017-06-06 ENCOUNTER — Encounter: Payer: Self-pay | Admitting: Family Medicine

## 2017-06-06 VITALS — BP 145/76 | HR 80 | Temp 98.4°F | Resp 18 | Ht 64.96 in | Wt 171.6 lb

## 2017-06-06 DIAGNOSIS — L853 Xerosis cutis: Secondary | ICD-10-CM

## 2017-06-06 DIAGNOSIS — L259 Unspecified contact dermatitis, unspecified cause: Secondary | ICD-10-CM | POA: Diagnosis not present

## 2017-06-06 MED ORDER — TRIAMCINOLONE ACETONIDE 0.1 % EX CREA: TOPICAL_CREAM | CUTANEOUS | 0 refills | Status: DC

## 2017-06-06 NOTE — Patient Instructions (Addendum)
Triamcinolone cream to itching areas up to twice per day. Use over-the-counter Aveeno or Eucerin lotion for dry skin, once to 2 times per day. You can also try changing detergent to Tide free and clear as that may be gentler. Follow-up in approximately one month for a physical, diabetes, and this rash is not improved at that time, can refer you to dermatology.   Contact Dermatitis Dermatitis is redness, soreness, and swelling (inflammation) of the skin. Contact dermatitis is a reaction to certain substances that touch the skin. There are two types of contact dermatitis:  Irritant contact dermatitis. This type is caused by something that irritates your skin, such as dry hands from washing them too much. This type does not require previous exposure to the substance for a reaction to occur. This type is more common.  Allergic contact dermatitis. This type is caused by a substance that you are allergic to, such as a nickel allergy or poison ivy. This type only occurs if you have been exposed to the substance (allergen) before. Upon a repeat exposure, your body reacts to the substance. This type is less common.  What are the causes? Many different substances can cause contact dermatitis. Irritant contact dermatitis is most commonly caused by exposure to:  Makeup.  Soaps.  Detergents.  Bleaches.  Acids.  Metal salts, such as nickel.  Allergic contact dermatitis is most commonly caused by exposure to:  Poisonous plants.  Chemicals.  Jewelry.  Latex.  Medicines.  Preservatives in products, such as clothing.  What increases the risk? This condition is more likely to develop in:  People who have jobs that expose them to irritants or allergens.  People who have certain medical conditions, such as asthma or eczema.  What are the signs or symptoms? Symptoms of this condition may occur anywhere on your body where the irritant has touched you or is touched by you. Symptoms  include:  Dryness or flaking.  Redness.  Cracks.  Itching.  Pain or a burning feeling.  Blisters.  Drainage of small amounts of blood or clear fluid from skin cracks.  With allergic contact dermatitis, there may also be swelling in areas such as the eyelids, mouth, or genitals. How is this diagnosed? This condition is diagnosed with a medical history and physical exam. A patch skin test may be performed to help determine the cause. If the condition is related to your job, you may need to see an occupational medicine specialist. How is this treated? Treatment for this condition includes figuring out what caused the reaction and protecting your skin from further contact. Treatment may also include:  Steroid creams or ointments. Oral steroid medicines may be needed in more severe cases.  Antibiotics or antibacterial ointments, if a skin infection is present.  Antihistamine lotion or an antihistamine taken by mouth to ease itching.  A bandage (dressing).  Follow these instructions at home: [place] your skin as needed.  Apply cool compresses to the affected areas.  Try taking a bath with: ? Epsom salts. Follow the instructions on the packaging. You can get these at your local pharmacy or grocery store. ? Baking soda. Pour a small amount into the bath as directed by your health care provider. ? Colloidal oatmeal. Follow the instructions on the packaging. You can get this at your local pharmacy or grocery store.  Try applying baking soda paste to your skin. Stir water into baking soda until it reaches a paste-like consistency.  Do not scratch your  skin.  Bathe less frequently, such as every other day.  Bathe in lukewarm water. Avoid using hot water. Medicines  Take or apply over-the-counter and prescription medicines only as told by your health care provider.  If you were prescribed an antibiotic medicine, take or apply your antibiotic as told by your  health care provider. Do not stop using the antibiotic even if your condition starts to improve. General instructions  Keep all follow-up visits as told by your health care provider. This is important.  Avoid the substance that caused your reaction. If you do not know what caused it, keep a journal to try to track what caused it. Write down: ? What you eat. ? What cosmetic products you use. ? What you drink. ? What you wear in the affected area. This includes jewelry.  If you were given a dressing, take care of it as told by your health care provider. This includes when to change and remove it. Contact a health care provider if:  Your condition does not improve with treatment.  Your condition gets worse.  You have signs of infection such as swelling, tenderness, redness, soreness, or warmth in the affected area.  You have a fever.  You have new symptoms. Get help right away if:  You have a severe headache, neck pain, or neck stiffness.  You vomit.  You feel very sleepy.  You notice red streaks coming from the affected area.  Your bone or joint underneath the affected area becomes painful after the skin has healed.  The affected area turns darker.  You have difficulty breathing. This information is not intended to replace advice given to you by your health care provider. Make sure you discuss any questions you have with your health care provider. Document Released: 10/01/2000 Document Revised: 03/11/2016 Document Reviewed: 02/19/2015 Elsevier Interactive Patient Education  2018 [employer].   IF you received an x-ray today, you will receive an invoice from [facility]. Please contact [facility] at [phone] with questions or concerns regarding your invoice.   IF you received labwork today, you will receive an invoice from [place]. Please contact [facility] at [phone] with questions or concerns regarding your invoice.   Our billing staff will  not be able to assist you with questions regarding bills from these companies.  You will be contacted with the lab results as soon as they are available. The fastest way to get your results is to activate your My Chart account. Instructions are located on the last page of this paperwork. If you have not heard from [place] regarding the results in 2 weeks, please contact this office.

## 2017-06-06 NOTE — Progress Notes (Signed)
Subjective:  By signing my name below, I, Tony Price, attest that this documentation has been prepared under the direction and in the presence of Tony Agreste, MD Electronically Signed: Ladene Artist, ED Scribe 06/06/2017 at 10:52 AM.   Patient ID: Tony Price, male    DOB: 11/26/1940, 76 y.o.   MRN: [id number]  Chief Complaint  Patient presents with  . Rash    follow up on rash on left knee    HPI Tony Price is a 76 y.o. male who presents to [facility] for a follow-up on rash. Last seen 04/08/17 for multiple concerns. Pt did have a rash on his L and R knees as well as hyperpigmented areas over lower abdominal wall, suspected contact dermatitis. Treated with triamcinolone 0.1% bid.   Pt has been using triamcinolone cream to his knees and abdomen with significant improvement, however he still has a pruritic rash to his back. States he has been unable to reach the area to his back to apply the cream for 2-3 weeks since he has been home alone. Pt is still using Dove soap and Tide detergent.   Patient Active Problem List   Diagnosis Date Noted  . Diabetic retinopathy ([place]) 03/31/2016  . Onychomycosis 02/25/2016  . UTI (lower urinary tract infection) 11/17/2014  . DM (diabetes mellitus), type 2 ([place]) 11/17/2014  . HTN (hypertension) 11/17/2014  . Sepsis ([place]) 11/17/2014   Past Medical History:  Diagnosis Date  . Diabetes mellitus   . Hypercholesterolemia   . Hypertension   . Kidney stones    Past Surgical History:  Procedure Laterality Date  . CATARACT EXTRACTION    . EYE SURGERY    . PROSTATE BIOPSY    . ROTATOR CUFF REPAIR     Right   No Known Allergies Prior to Admission medications   Medication Sig Start Date End Date Taking? Authorizing Provider  amLODipine (NORVASC) 5 MG tablet Take 1 tablet (5 mg total) by mouth daily. 04/08/17  Yes Tony Agreste, MD  Ascorbic Acid (VITAMIN C) 1000 MG tablet Take 1,000 mg by mouth daily.   Yes [provider]  aspirin 81 MG chewable tablet Chew by mouth daily.   Yes [provider]  atorvastatin (LIPITOR) 20 MG tablet Take 1 tablet (20 mg total) by mouth daily. 04/08/17  Yes Tony Agreste, MD  Blood Glucose Monitoring Suppl (ONE TOUCH ULTRA SYSTEM KIT) w/Device KIT Test blood sugar once daily. Dx: E11.9 07/01/16  Yes Tony Agreste, MD  glimepiride (AMARYL) 2 MG tablet Take 1 tablet (2 mg total) by mouth 2 (two) times daily. 04/08/17  Yes Tony Agreste, MD  glucose blood test strip Test blood sugar once daily. Dx: E11.9 07/01/16  Yes Tony Agreste, MD  Lancets [facility] ULTRASOFT) lancets Test blood sugar once daily. Dx: E11.9 07/01/16  Yes Tony Agreste, MD  latanoprost (XALATAN) 0.005 % ophthalmic solution Place 1 drop into both eyes at bedtime.   Yes [provider]  quinapril (ACCUPRIL) 20 MG tablet Take 1 tablet (20 mg total) by mouth daily. 04/08/17  Yes Tony Agreste, MD  triamcinolone cream (KENALOG) 0.1 % APPLY TO AFFECTED AREA TWICE A DAY 04/25/17  Yes Tony Agreste, MD   Social History   Social History  . Marital status: Divorced    Spouse name: [name]  . Number of children: [name]  . Years of education: [name]   Occupational History  . Not on file.  Social History Main Topics  . Smoking status: Former Smoker    Years: 2.00  . Smokeless tobacco: Never Used  . Alcohol use No  . Drug use: No  . Sexual activity: Not on file   Other Topics Concern  . Not on file   Social History Narrative  . No narrative on file   Review of Systems  Skin: Positive for rash.      Objective:   Physical Exam  Constitutional: He is oriented to person, place, and time. He appears well-developed and well-nourished. No distress.  HENT:  Head: Normocephalic and atraumatic.  Eyes: Conjunctivae and EOM are normal.  Neck: Neck supple. No tracheal deviation present.  Cardiovascular: Normal rate.   Pulmonary/Chest: Effort normal. No respiratory  distress.  Musculoskeletal: Normal range of motion.  Neurological: He is alert and oriented to person, place, and time.  Skin: Skin is warm and dry. Rash noted.  Knees: Few hyperpigmented areas, minimally excoriated. Back: Hyperpigmented dry patch to L upper back measuring 2 cm x 3 cm with a healed scab centrally. No surrounding erythema or induration. Dried skin at the elbows and arms.  Psychiatric: He has a normal mood and affect. His behavior is normal.  Nursing note and vitals reviewed.  Vitals:   06/06/17 1020  BP: (!) 145/76  Pulse: 80  Resp: 18  Temp: 98.4 F (36.9 C)  TempSrc: Oral  SpO2: 97%  Weight: 171 lb 9.6 oz (77.8 kg)  Height: 5' 4.96" (1.65 m)      Assessment & Plan:    Tony Price is a 77 y.o. male Dry skin dermatitis  Contact dermatitis, unspecified contact dermatitis type, unspecified trigger - Plan: triamcinolone cream (KENALOG) 0.1 %  Improving, but some persistent rash on knees and upper back. On exam of his arms and legs he does appear to have some dry skin. Differential includes combo of dry skin dermatitis as well as contact dermatitis.  -continue triamcinolone to affected areas, moisturizing lotion such as Aveeno or Eucerin, recheck in the next 3-4 weeks, and if not improving consider dermatology eval. Sooner if worse.   Meds ordered this encounter  Medications  . triamcinolone cream (KENALOG) 0.1 %    Sig: APPLY TO AFFECTED AREA TWICE A DAY    Dispense:  45 g    Refill:  0   Patient Instructions   Triamcinolone cream to itching areas up to twice per day. Use over-the-counter Aveeno or Eucerin lotion for dry skin, once to 2 times per day. You can also try changing detergent to Tide free and clear as that may be gentler. Follow-up in approximately one month for a physical, diabetes, and this rash is not improved at that time, can refer you to dermatology.   Contact Dermatitis Dermatitis is redness, soreness, and swelling (inflammation) of  the skin. Contact dermatitis is a reaction to certain substances that touch the skin. There are two types of contact dermatitis:  Irritant contact dermatitis. This type is caused by something that irritates your skin, such as dry hands from washing them too much. This type does not require previous exposure to the substance for a reaction to occur. This type is more common.  Allergic contact dermatitis. This type is caused by a substance that you are allergic to, such as a nickel allergy or poison ivy. This type only occurs if you have been exposed to the substance (allergen) before. Upon a repeat exposure, your body reacts to the substance. This type is less common.  What are the causes? Many different substances can cause contact dermatitis. Irritant contact dermatitis is most commonly caused by exposure to:  Makeup.  Soaps.  Detergents.  Bleaches.  Acids.  Metal salts, such as nickel.  Allergic contact dermatitis is most commonly caused by exposure to:  Poisonous plants.  Chemicals.  Jewelry.  Latex.  Medicines.  Preservatives in products, such as clothing.  What increases the risk? This condition is more likely to develop in:  People who have jobs that expose them to irritants or allergens.  People who have certain medical conditions, such as asthma or eczema.  What are the signs or symptoms? Symptoms of this condition may occur anywhere on your body where the irritant has touched you or is touched by you. Symptoms include:  Dryness or flaking.  Redness.  Cracks.  Itching.  Pain or a burning feeling.  Blisters.  Drainage of small amounts of blood or clear fluid from skin cracks.  With allergic contact dermatitis, there may also be swelling in areas such as the eyelids, mouth, or genitals. How is this diagnosed? This condition is diagnosed with a medical history and physical exam. A patch skin test may be performed to help determine the cause. If the  condition is related to your job, you may need to see an occupational medicine specialist. How is this treated? Treatment for this condition includes figuring out what caused the reaction and protecting your skin from further contact. Treatment may also include:  Steroid creams or ointments. Oral steroid medicines may be needed in more severe cases.  Antibiotics or antibacterial ointments, if a skin infection is present.  Antihistamine lotion or an antihistamine taken by mouth to ease itching.  A bandage (dressing).  Follow these instructions at home: [place] your skin as needed.  Apply cool compresses to the affected areas.  Try taking a bath with: ? Epsom salts. Follow the instructions on the packaging. You can get these at your local pharmacy or grocery store. ? Baking soda. Pour a small amount into the bath as directed by your health care provider. ? Colloidal oatmeal. Follow the instructions on the packaging. You can get this at your local pharmacy or grocery store.  Try applying baking soda paste to your skin. Stir water into baking soda until it reaches a paste-like consistency.  Do not scratch your skin.  Bathe less frequently, such as every other day.  Bathe in lukewarm water. Avoid using hot water. Medicines  Take or apply over-the-counter and prescription medicines only as told by your health care provider.  If you were prescribed an antibiotic medicine, take or apply your antibiotic as told by your health care provider. Do not stop using the antibiotic even if your condition starts to improve. General instructions  Keep all follow-up visits as told by your health care provider. This is important.  Avoid the substance that caused your reaction. If you do not know what caused it, keep a journal to try to track what caused it. Write down: ? What you eat. ? What cosmetic products you use. ? What you drink. ? What you wear in the affected area. This  includes jewelry.  If you were given a dressing, take care of it as told by your health care provider. This includes when to change and remove it. Contact a health care provider if:  Your condition does not improve with treatment.  Your condition gets worse.  You have signs of infection such as swelling,  tenderness, redness, soreness, or warmth in the affected area.  You have a fever.  You have new symptoms. Get help right away if:  You have a severe headache, neck pain, or neck stiffness.  You vomit.  You feel very sleepy.  You notice red streaks coming from the affected area.  Your bone or joint underneath the affected area becomes painful after the skin has healed.  The affected area turns darker.  You have difficulty breathing. This information is not intended to replace advice given to you by your health care provider. Make sure you discuss any questions you have with your health care provider. Document Released: 10/01/2000 Document Revised: 03/11/2016 Document Reviewed: 02/19/2015 Elsevier Interactive Patient Education  2018 [employer].   IF you received an x-ray today, you will receive an invoice from [facility]. Please contact [facility] at [phone] with questions or concerns regarding your invoice.   IF you received labwork today, you will receive an invoice from [facility]. Please contact [facility] at [phone] with questions or concerns regarding your invoice.   Our billing staff will not be able to assist you with questions regarding bills from these companies.  You will be contacted with the lab results as soon as they are available. The fastest way to get your results is to activate your My Chart account. Instructions are located on the last page of this paperwork. If you have not heard from [place] regarding the results in 2 weeks, please contact this office.      I personally performed the services described in this documentation,  which was scribed in my presence. The recorded information has been reviewed and considered for accuracy and completeness, addended by me as needed, and agree with information above.  Signed,   [name], MD [facility].  06/06/17 11:03 AM

## 2017-07-04 ENCOUNTER — Other Ambulatory Visit: Payer: Self-pay

## 2017-07-04 MED ORDER — GLUCOSE BLOOD VI STRP: ORAL_STRIP | 3 refills | Status: DC

## 2017-07-04 MED ORDER — ONETOUCH ULTRASOFT LANCETS MISC: 3 refills | Status: DC

## 2017-07-07 ENCOUNTER — Ambulatory Visit: Payer: Medicare Other

## 2017-07-07 ENCOUNTER — Telehealth: Payer: Self-pay

## 2017-07-07 NOTE — Telephone Encounter (Signed)
Called pt to schedule Medicare Annual Wellness Visit prior to upcoming PCP visit.    Tony Price, B.A.  Care Guide [phone]

## 2017-07-08 ENCOUNTER — Ambulatory Visit: Payer: Medicare Other

## 2017-07-08 ENCOUNTER — Telehealth: Payer: Self-pay | Admitting: Family Medicine

## 2017-07-08 VITALS — BP 160/75 | HR 78 | Temp 98.5°F | Ht 65.0 in | Wt 177.4 lb

## 2017-07-08 DIAGNOSIS — Z0000 Encounter for general adult medical examination without abnormal findings: Secondary | ICD-10-CM

## 2017-07-08 NOTE — Patient Instructions (Addendum)
Tony Price , Thank you for taking time to come for your Medicare Wellness Visit. I appreciate your ongoing commitment to your health goals. Please review the following plan we discussed and let me know if I can assist you in the future.   Screening recommendations/referrals: Colonoscopy: up to date Recommended yearly ophthalmology/optometry visit for glaucoma screening and checkup Recommended yearly dental visit for hygiene and checkup  Vaccinations: Influenza vaccine: declined Pneumococcal vaccine: declined Tdap vaccine: up to date, next due 10/18/2020 Shingles vaccine: declined  Advanced directives: Advance directive discussed with you today. I have provided a copy for you to complete at home and have notarized. Once this is complete please bring a copy in to our office so we can scan it into your chart.   Conditions/risks identified: Try to increase your daily water intake to improve overall health performance.  Next appointment: 07/15/17 @ 8 am   Preventive Care 65 Years and Older, Male Preventive care refers to lifestyle choices and visits with your health care provider that can promote health and wellness. What does preventive care include?  A yearly physical exam. This is also called an annual well check.  Dental exams once or twice a year.  Routine eye exams. Ask your health care provider how often you should have your eyes checked.  Personal lifestyle choices, including:  Daily care of your teeth and gums.  Regular physical activity.  Eating a healthy diet.  Avoiding tobacco and drug use.  Limiting alcohol use.  Practicing safe sex.  Taking low doses of aspirin every day.  Taking vitamin and mineral supplements as recommended by your health care provider. What happens during an annual well check? The services and screenings done by your health care provider during your annual well check will depend on your age, overall health, lifestyle risk factors, and  family history of disease. Counseling  Your health care provider may ask you questions about your:  Alcohol use.  Tobacco use.  Drug use.  Emotional well-being.  Home and relationship well-being.  Sexual activity.  Eating habits.  History of falls.  Memory and ability to understand (cognition).  Work and work Statistician. Screening  You may have the following tests or measurements:  Height, weight, and BMI.  Blood pressure.  Lipid and cholesterol levels. These may be checked every 5 years, or more frequently if you are over 45 years old.  Skin check.  Lung cancer screening. You may have this screening every year starting at age 101 if you have a 30-pack-year history of smoking and currently smoke or have quit within the past 15 years.  Fecal occult blood test (FOBT) of the stool. You may have this test every year starting at age 45.  Flexible sigmoidoscopy or colonoscopy. You may have a sigmoidoscopy every 5 years or a colonoscopy every 10 years starting at age 46.  Prostate cancer screening. Recommendations will vary depending on your family history and other risks.  Hepatitis C blood test.  Hepatitis B blood test.  Sexually transmitted disease (STD) testing.  Diabetes screening. This is done by checking your blood sugar (glucose) after you have not eaten for a while (fasting). You may have this done every 1-3 years.  Abdominal aortic aneurysm (AAA) screening. You may need this if you are a current or former smoker.  Osteoporosis. You may be screened starting at age 78 if you are at high risk. Talk with your health care provider about your test results, treatment options, and if necessary, the  need for more tests. Vaccines  Your health care provider may recommend certain vaccines, such as:  Influenza vaccine. This is recommended every year.  Tetanus, diphtheria, and acellular pertussis (Tdap, Td) vaccine. You may need a Td booster every 10 years.  Zoster  vaccine. You may need this after age 76.  Pneumococcal 13-valent conjugate (PCV13) vaccine. One dose is recommended after age 76.  Pneumococcal polysaccharide (PPSV23) vaccine. One dose is recommended after age 76. Talk to your health care provider about which screenings and vaccines you need and how often you need them. This information is not intended to replace advice given to you by your health care provider. Make sure you discuss any questions you have with your health care provider. Document Released: 10/31/2015 Document Revised: 06/23/2016 Document Reviewed: 08/05/2015 Elsevier Interactive Patient Education  2017 [place] Prevention in the Home Falls can cause injuries. They can happen to people of all ages. There are many things you can do to make your home safe and to help prevent falls. What can I do on the outside of my home?  Regularly fix the edges of walkways and driveways and fix any cracks.  Remove anything that might make you trip as you walk through a door, such as a raised step or threshold.  Trim any bushes or trees on the path to your home.  Use bright outdoor lighting.  Clear any walking paths of anything that might make someone trip, such as rocks or tools.  Regularly check to see if handrails are loose or broken. Make sure that both sides of any steps have handrails.  Any raised decks and porches should have guardrails on the edges.  Have any leaves, snow, or ice cleared regularly.  Use sand or salt on walking paths during winter.  Clean up any spills in your garage right away. This includes oil or grease spills. What can I do in the bathroom?  Use night lights.  Install grab bars by the toilet and in the tub and shower. Do not use towel bars as grab bars.  Use non-skid mats or decals in the tub or shower.  If you need to sit down in the shower, use a plastic, non-slip stool.  Keep the floor dry. Clean up any water that spills on the  floor as soon as it happens.  Remove soap buildup in the tub or shower regularly.  Attach bath mats securely with double-sided non-slip rug tape.  Do not have throw rugs and other things on the floor that can make you trip. What can I do in the bedroom?  Use night lights.  Make sure that you have a light by your bed that is easy to reach.  Do not use any sheets or blankets that are too big for your bed. They should not hang down onto the floor.  Have a firm chair that has side arms. You can use this for support while you get dressed.  Do not have throw rugs and other things on the floor that can make you trip. What can I do in the kitchen?  Clean up any spills right away.  Avoid walking on wet floors.  Keep items that you use a lot in easy-to-reach places.  If you need to reach something above you, use a strong step stool that has a grab bar.  Keep electrical cords out of the way.  Do not use floor polish or wax that makes floors slippery. If you must use wax,  use non-skid floor wax.  Do not have throw rugs and other things on the floor that can make you trip. What can I do with my stairs?  Do not leave any items on the stairs.  Make sure that there are handrails on both sides of the stairs and use them. Fix handrails that are broken or loose. Make sure that handrails are as long as the stairways.  Check any carpeting to make sure that it is firmly attached to the stairs. Fix any carpet that is loose or worn.  Avoid having throw rugs at the top or bottom of the stairs. If you do have throw rugs, attach them to the floor with carpet tape.  Make sure that you have a light switch at the top of the stairs and the bottom of the stairs. If you do not have them, ask someone to add them for you. What else can I do to help prevent falls?  Wear shoes that:  Do not have high heels.  Have rubber bottoms.  Are comfortable and fit you well.  Are closed at the toe. Do not wear  sandals.  If you use a stepladder:  Make sure that it is fully opened. Do not climb a closed stepladder.  Make sure that both sides of the stepladder are locked into place.  Ask someone to hold it for you, if possible.  Clearly mark and make sure that you can see:  Any grab bars or handrails.  First and last steps.  Where the edge of each step is.  Use tools that help you move around (mobility aids) if they are needed. These include:  Canes.  Walkers.  Scooters.  Crutches.  Turn on the lights when you go into a dark area. Replace any light bulbs as soon as they burn out.  Set up your furniture so you have a clear path. Avoid moving your furniture around.  If any of your floors are uneven, fix them.  If there are any pets around you, be aware of where they are.  Review your medicines with your doctor. Some medicines can make you feel dizzy. This can increase your chance of falling. Ask your doctor what other things that you can do to help prevent falls. This information is not intended to replace advice given to you by your health care provider. Make sure you discuss any questions you have with your health care provider. Document Released: 07/31/2009 Document Revised: 03/11/2016 Document Reviewed: 11/08/2014 Elsevier Interactive Patient Education  2017 [employer].

## 2017-07-08 NOTE — Progress Notes (Signed)
Subjective:   Tony Price is a 76 y.o. male who presents for Medicare Annual/Subsequent preventive examination.  Review of Systems:  [name] Cardiac Risk Factors include: advanced age (>53mn, >>48women);diabetes mellitus;hypertension;male gender;dyslipidemia     Objective:    Vitals: BP (!) 160/75   Pulse 78   Temp 98.5 F (36.9 C) (Oral)   Ht '5\' 5"'  (1.651 m)   Wt 177 lb 6 oz (80.5 kg)   BMI 29.52 kg/m   Body mass index is 29.52 kg/m.  Tobacco History  Smoking Status  . Former Smoker  . Years: 2.00  Smokeless Tobacco  . Never Used     Counseling given: Not Answered   Past Medical History:  Diagnosis Date  . Diabetes mellitus   . Hypercholesterolemia   . Hypertension   . Kidney stones    Past Surgical History:  Procedure Laterality Date  . CATARACT EXTRACTION    . EYE SURGERY    . PROSTATE BIOPSY    . ROTATOR CUFF REPAIR     Right   Family History  Problem Relation Age of Onset  . Family history unknown: Yes   History  Sexual Activity  . Sexual activity: Not on file    Outpatient Encounter Prescriptions as of 07/08/2017  Medication Sig  . amLODipine (NORVASC) 5 MG tablet Take 1 tablet (5 mg total) by mouth daily.  . Ascorbic Acid (VITAMIN C) 1000 MG tablet Take 1,000 mg by mouth daily.  .Marland Kitchenaspirin 81 MG chewable tablet Chew by mouth daily.  .Marland Kitchenatorvastatin (LIPITOR) 20 MG tablet Take 1 tablet (20 mg total) by mouth daily.  . Blood Glucose Monitoring Suppl (ONE TOUCH ULTRA SYSTEM KIT) w/Device KIT Test blood sugar once daily. Dx: E11.9  . glimepiride (AMARYL) 2 MG tablet Take 1 tablet (2 mg total) by mouth 2 (two) times daily.  .Marland Kitchenglucose blood test strip Test blood sugar once daily. Dx: E11.9  . Lancets (ONETOUCH ULTRASOFT) lancets Test blood sugar once daily. Dx: E11.9  . latanoprost (XALATAN) 0.005 % ophthalmic solution Place 1 drop into both eyes at bedtime.  . quinapril (ACCUPRIL) 20 MG tablet Take 1 tablet (20 mg total) by mouth daily.  .[name] triamcinolone cream (KENALOG) 0.1 % APPLY TO AFFECTED AREA TWICE A DAY   No facility-administered encounter medications on file as of 07/08/2017.     Activities of Daily Living In your present state of health, do you have any difficulty performing the following activities: 07/08/2017 12/13/2016  Hearing? N N  Vision? N N  Difficulty concentrating or making decisions? N N  Walking or climbing stairs? N N  Dressing or bathing? N N  Doing errands, shopping? N N  Preparing Food and eating ? N -  Using the Toilet? N -  In the past six months, have you accidently leaked urine? N -  Do you have problems with loss of bowel control? N -  Managing your Medications? N -  Managing your Finances? N -  Housekeeping or managing your Housekeeping? N -  Some recent data might be hidden    Patient Care Team: GWendie Agreste MD as PCP - General (Family Medicine) MHayden Pedro MD as Consulting Physician (Ophthalmology) HMonna Fam MD as Consulting Physician (Ophthalmology)   Assessment:     Exercise Activities and Dietary recommendations Current Exercise Habits: Structured exercise class, Type of exercise: walking (cardio exercises ), Time (Minutes): > 60, Frequency (Times/Week): 4, Weekly Exercise (Minutes/Week): 0, Intensity: Moderate, Exercise limited by:  None identified  Goals    . Increase water intake          Patient will try to increase his daily water intake      Fall Risk Fall Risk  07/08/2017 04/08/2017 12/13/2016 09/06/2016 08/28/2016  Falls in the past year? No No No No No   Depression Screen PHQ 2/9 Scores 07/08/2017 04/08/2017 12/13/2016 09/06/2016  PHQ - 2 Score 0 0 0 0    Cognitive Function MMSE - Mini Mental State Exam 06/17/2016  Orientation to time 5  Orientation to Place 5  Registration 3  Attention/ Calculation 5  Recall 2  Language- name 2 objects 2  Language- repeat 1  Language- follow 3 step command 3  Language- read & follow direction 1  Write a  sentence 1  Copy design 1  Total score 29     6CIT Screen 07/08/2017  What Year? 0 points  What month? 0 points  What time? 0 points  Count back from 20 0 points  Months in reverse 0 points  Repeat phrase 6 points  Total Score 6    Immunization History  Administered Date(s) Administered  . Tdap 10/18/2010   Screening Tests Health Maintenance  Topic Date Due  . INFLUENZA VACCINE  07/09/2027 (Originally 05/18/2017)  . PNA vac Low Risk Adult (1 of 2 - PCV13) 07/09/2027 (Originally 10/02/2006)  . HEMOGLOBIN A1C  10/09/2017  . OPHTHALMOLOGY EXAM  02/14/2018  . FOOT EXAM  04/08/2018  . TETANUS/TDAP  10/18/2020  . COLONOSCOPY  01/06/2027      Plan:   I have personally reviewed and noted the following in the patient's chart:   . Medical and social history . Use of alcohol, tobacco or illicit drugs  . Current medications and supplements . Functional ability and status . Nutritional status . Physical activity . Advanced directives . List of other physicians . Hospitalizations, surgeries, and ER visits in previous 12 months . Vitals . Screenings to include cognitive, depression, and falls . Referrals and appointments  In addition, I have reviewed and discussed with patient certain preventive protocols, quality metrics, and best practice recommendations. A written personalized care plan for preventive services as well as general preventive health recommendations were provided to patient.  Patient declined pneumonia and flu vaccines.   Andrez Grime, LPN  7/42/5956

## 2017-07-08 NOTE — Telephone Encounter (Signed)
Pt came in stating that he needs a prescription for prodigy all in one strips 50 ct.. Faxed to [phone]

## 2017-07-11 ENCOUNTER — Ambulatory Visit (INDEPENDENT_AMBULATORY_CARE_PROVIDER_SITE_OTHER): Payer: Medicare Other | Admitting: Ophthalmology

## 2017-07-11 ENCOUNTER — Encounter: Payer: Medicare Other | Admitting: Family Medicine

## 2017-07-11 DIAGNOSIS — H35033 Hypertensive retinopathy, bilateral: Secondary | ICD-10-CM

## 2017-07-11 DIAGNOSIS — H35341 Macular cyst, hole, or pseudohole, right eye: Secondary | ICD-10-CM

## 2017-07-11 DIAGNOSIS — E113291 Type 2 diabetes mellitus with mild nonproliferative diabetic retinopathy without macular edema, right eye: Secondary | ICD-10-CM | POA: Diagnosis not present

## 2017-07-11 DIAGNOSIS — H43813 Vitreous degeneration, bilateral: Secondary | ICD-10-CM | POA: Diagnosis not present

## 2017-07-11 DIAGNOSIS — E11319 Type 2 diabetes mellitus with unspecified diabetic retinopathy without macular edema: Secondary | ICD-10-CM

## 2017-07-11 DIAGNOSIS — I10 Essential (primary) hypertension: Secondary | ICD-10-CM

## 2017-07-11 LAB — HM DIABETES EYE EXAM

## 2017-07-12 NOTE — Telephone Encounter (Signed)
Please order if appropriate.

## 2017-07-14 ENCOUNTER — Other Ambulatory Visit: Payer: Self-pay | Admitting: *Deleted

## 2017-07-14 DIAGNOSIS — E119 Type 2 diabetes mellitus without complications: Secondary | ICD-10-CM

## 2017-07-14 MED ORDER — BLOOD GLUCOSE MONITOR KIT: PACK | 0 refills | Status: DC

## 2017-07-14 NOTE — Telephone Encounter (Signed)
Spoke with patient informed him we needed to change his meter the strips that go with that meter are no longer covered by his medicare.     Patient understood.  Spoke with pharmacy and sent in all new prescriptions for the correct kit and a new kit.

## 2017-07-15 ENCOUNTER — Ambulatory Visit (INDEPENDENT_AMBULATORY_CARE_PROVIDER_SITE_OTHER): Payer: Medicare Other | Admitting: Family Medicine

## 2017-07-15 ENCOUNTER — Encounter: Payer: Self-pay | Admitting: Family Medicine

## 2017-07-15 VITALS — BP 132/68 | HR 67 | Temp 98.5°F | Resp 16 | Ht 65.0 in | Wt 174.0 lb

## 2017-07-15 DIAGNOSIS — I10 Essential (primary) hypertension: Secondary | ICD-10-CM

## 2017-07-15 DIAGNOSIS — R109 Unspecified abdominal pain: Secondary | ICD-10-CM

## 2017-07-15 DIAGNOSIS — E11319 Type 2 diabetes mellitus with unspecified diabetic retinopathy without macular edema: Secondary | ICD-10-CM

## 2017-07-15 DIAGNOSIS — E785 Hyperlipidemia, unspecified: Secondary | ICD-10-CM

## 2017-07-15 LAB — HEMOGLOBIN A1C
Est. average glucose Bld gHb Est-mCnc: 154 mg/dL
Hgb A1c MFr Bld: 7 % — ABNORMAL HIGH (ref 4.8–5.6)

## 2017-07-15 NOTE — Patient Instructions (Addendum)
For side pain - occasional Tylenol ok, range of motion and stretches as we discussed - especially after exercises. If the pain isn't improving in the next 4-6 weeks, please return for recheck.. We can repeat xrays or other lab tests at that time.   Return to the clinic or go to the nearest emergency room if any of your symptoms worsen or new symptoms occur.  Plan to check hemoglobin A1c today, then we can to determine if any medication changes needed for diabetes. No changes to medicines right now. Follow-up 3 months.   IF you received an x-ray today, you will receive an invoice from [facility]. Please contact [facility] at [phone] with questions or concerns regarding your invoice.   IF you received labwork today, you will receive an invoice from [facility]. Please contact [facility] at [phone] with questions or concerns regarding your invoice.   Our billing staff will not be able to assist you with questions regarding bills from these companies.  You will be contacted with the lab results as soon as they are available. The fastest way to get your results is to activate your My Chart account. Instructions are located on the last page of this paperwork. If you have not heard from [place] regarding the results in 2 weeks, please contact this office.

## 2017-07-15 NOTE — Progress Notes (Signed)
Subjective:  By signing my name below, I, Essence Howell, attest that this documentation has been prepared under the direction and in the presence of [name], MD Electronically Signed: Ladene Artist, ED Scribe 07/15/2017 at 8:26 AM.   Patient ID: [name], male    DOB: 1941/08/10, 76 y.o.   MRN: [id number]  Chief Complaint  Patient presents with  . Annual Exam    TIIDM   HPI Kasen Mccoin is a 76 y.o. male who presents to Primary Care at [facility] for a follow-up. Pt had a wellness visit last week.   Type II DM H/o diabetic retinopathy. Followed by optho Dr. Zigmund Daniel with the last visit being 9/24. Pt plans to follow-up with his pharmacy regarding the change in pricing for his blood glucose test strips. Pt checks his blood glucose at home with his latest reading being 121. Denies symptomatic lows. States he could not tolerate 1000 mg Metformin; states it "put him out of it".   Lab Results  Component Value Date   HGBA1C 7.0 (H) 04/09/2017   Lab Results  Component Value Date   MICROALBUR 1.1 08/04/2015   Continued on Amaryl 2 mg bid.  Wt Readings from Last 3 Encounters:  07/15/17 174 lb (78.9 kg)  07/08/17 177 lb 6 oz (80.5 kg)  06/06/17 171 lb 9.6 oz (77.8 kg)   HTN Takes Accupril 20 mg qd and Norvasc 5 mg qd. BP was elevated at wellness visit at 160/75. Checks his BP outside of the office with readings of 130/60. Denies chest pain, HA, light-headedness, dizziness, blood in stools, melena, abdominal.   Lab Results  Component Value Date   CREATININE 1.05 04/09/2017   Hyperlipidemia Lab Results  Component Value Date   CHOL 158 04/09/2017   HDL 63 04/09/2017   LDLCALC 82 04/09/2017   TRIG 66 04/09/2017   CHOLHDL 2.5 04/09/2017   Lab Results  Component Value Date   ALT 9 04/09/2017   AST 17 04/09/2017   ALKPHOS 82 04/09/2017   BILITOT 0.6 04/09/2017   Takes Lipitor 20 mg qd.    L Flank Pain At the end of the visit the pt would also like to discuss  L sided flank plain. 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 He is followed by urologists for elevated PSA with negative prostate biopsies. OV in December 2017 with Dr. Junious Silk, planned for continued surveillance. Has been treated for UTI and hospitalized for sepsis from UTI back in 2016. Had a L rib series Oct 2017, no apparent rib fractures.   Pt states that he has been intermittently having L flank pain since his visit in 2017 only with occasionally lying on the left side. Denies radiating pain into extremities or abdomen, increased pain with ambulating, twisting or turning. No medications tried PTA. Denies fever, night sweats, cough, sob, hematuria, dysuria, urinary frequency, difficulty urinating. Pt reports doing a lot of cardio.   Patient Active Problem List   Diagnosis Date Noted  . Diabetic retinopathy ([place]) 03/31/2016  . Onychomycosis 02/25/2016  . UTI (lower urinary tract infection) 11/17/2014  . DM (diabetes mellitus), type 2 ([place]) 11/17/2014  . HTN (hypertension) 11/17/2014  . Sepsis ([place]) 11/17/2014   Past Medical History:  Diagnosis Date  . Diabetes mellitus   . Hypercholesterolemia   . Hypertension   . Kidney stones    Past Surgical History:  Procedure  Laterality Date  . CATARACT EXTRACTION    . EYE SURGERY    . PROSTATE BIOPSY    . ROTATOR CUFF REPAIR     Right   No Known Allergies Prior to Admission medications   Medication Sig Start Date End Date Taking? Authorizing Provider  amLODipine (NORVASC) 5 MG tablet Take 1 tablet (5 mg total) by mouth daily. 04/08/17   [name], MD  Ascorbic Acid (VITAMIN C) 1000 MG tablet Take 1,000 mg by mouth daily.    [provider]  aspirin 81 MG chewable tablet Chew by mouth daily.    [provider]  atorvastatin (LIPITOR) 20 MG tablet Take 1 tablet (20 mg total) by mouth daily. 04/08/17   [name], MD    blood glucose meter kit and supplies KIT 1 touch ultra meter.   Check blood sugar once daily E11.9 07/14/17   [name], MD  Blood Glucose Monitoring Suppl (ONE TOUCH ULTRA SYSTEM KIT) w/Device KIT Test blood sugar once daily. Dx: E11.9 07/01/16   [name], MD  glimepiride (AMARYL) 2 MG tablet Take 1 tablet (2 mg total) by mouth 2 (two) times daily. 04/08/17   [name], MD  glucose blood test strip Test blood sugar once daily. Dx: E11.9 07/04/17   [name], MD  Lancets [facility] ULTRASOFT) lancets Test blood sugar once daily. Dx: E11.9 07/04/17   [name], MD  latanoprost (XALATAN) 0.005 % ophthalmic solution Place 1 drop into both eyes at bedtime.    [provider]  quinapril (ACCUPRIL) 20 MG tablet Take 1 tablet (20 mg total) by mouth daily. 04/08/17   [name], MD  triamcinolone cream (KENALOG) 0.1 % APPLY TO AFFECTED AREA TWICE A DAY 06/06/17   [name], MD   Social History   Social History  . Marital status: Divorced    Spouse name: [name]  . Number of children: [name]  . Years of education: [name]   Occupational History  . Not on file.   Social History Main Topics  . Smoking status: Former Smoker    Years: 2.00  . Smokeless tobacco: Never Used  . Alcohol use No  . Drug use: No  . Sexual activity: Not on file   Other Topics Concern  . Not on file   Social History Narrative  . No narrative on file   Review of Systems  Constitutional: Negative for diaphoresis, fatigue, fever and unexpected weight change.  Eyes: Negative for visual disturbance.  Respiratory: Negative for cough, chest tightness and shortness of breath.   Cardiovascular: Negative for chest pain, palpitations and leg swelling.  Gastrointestinal: Negative for abdominal pain and blood in stool.  Genitourinary: Positive for flank pain. Negative for difficulty urinating, dysuria and hematuria.  Neurological: Negative for dizziness, light-headedness and  headaches.      Objective:   Physical Exam  Constitutional: He is oriented to person, place, and time. He appears well-developed and well-nourished.  HENT:  Head: Normocephalic and atraumatic.  Eyes: Pupils are equal, round, and reactive to light. EOM are normal.  Neck: No JVD present. Carotid bruit is not present.  Cardiovascular: Normal rate, regular rhythm and normal heart sounds.   No murmur heard. Pulmonary/Chest: Effort normal and breath sounds normal. He has no rales.  Musculoskeletal: He exhibits no edema.  Thoracic spine: no midline bony tenderness. No focal tenderness. No soft tissue swelling. Skin is intact.  Neurological: He is alert and oriented to  person, place, and time.  Skin: Skin is warm and dry.  Psychiatric: He has a normal mood and affect.  Vitals reviewed.   Vitals:   07/15/17 0809  BP: 132/68  Pulse: 67  Resp: 16  Temp: 98.5 F (36.9 C)  SpO2: 98%  Weight: 174 lb (78.9 kg)  Height: '5\' 5"'  (1.651 m)      Assessment & Plan:  Aneesh Faller is a 76 y.o. male Type 2 diabetes mellitus with retinopathy, without long-term current use of insulin, macular edema presence unspecified, unspecified laterality, unspecified retinopathy severity ([place]) - Plan: Hemoglobin A1c  - Has ongoing care with ophthalmologist due to history of retinopathy. Tolerating current dose of glimepiride, continue same dose, check A1c, follow-up in 3 months. Call if refill needed prior to that time.  Essential hypertension  -Stable, close to goal of less than 130/80 in diabetics. No med changes.  Hyperlipidemia, unspecified hyperlipidemia type  -Tolerating Lipitor, plan on lipid testing in 3 months. Ideal goal of LDL less than 70, slightly elevated 82 last visit  Left flank pain  -Intermittent, only at times with lying on that side. No associated urinary symptoms, no radicular symptoms, no other back pain. Possible lumbar muscular pain with his exercising. Reassuring exam, prior x-ray  last year was also reassuring.  - Trial of over-the-counter Tylenol, range of motion stretches after exercise, then if not improving within the next month to return to discuss other testing or repeat imaging.   No orders of the defined types were placed in this encounter.  Patient Instructions   For side pain - occasional Tylenol ok, range of motion and stretches as we discussed - especially after exercises. If the pain isn't improving in the next 4-6 weeks, please return for recheck.. We can repeat xrays or other lab tests at that time.   Return to the clinic or go to the nearest emergency room if any of your symptoms worsen or new symptoms occur.  Plan to check hemoglobin A1c today, then we can to determine if any medication changes needed for diabetes. No changes to medicines right now. Follow-up 3 months.   IF you received an x-ray today, you will receive an invoice from [facility]. Please contact [facility] at [phone] with questions or concerns regarding your invoice.   IF you received labwork today, you will receive an invoice from [facility]. Please contact [facility] at [phone] with questions or concerns regarding your invoice.   Our billing staff will not be able to assist you with questions regarding bills from these companies.  You will be contacted with the lab results as soon as they are available. The fastest way to get your results is to activate your My Chart account. Instructions are located on the last page of this paperwork. If you have not heard from [place] regarding the results in 2 weeks, please contact this office.       I personally performed the services described in this documentation, which was scribed in my presence. The recorded information has been reviewed and considered for accuracy and completeness, addended by me as needed, and agree with information above.  Signed,   [name], MD [facility] at [place].  07/15/17 2:11 PM

## 2017-07-15 NOTE — Progress Notes (Signed)
Test strips need to be sent to [facility] per pt.  [phone]

## 2017-07-19 ENCOUNTER — Telehealth: Payer: Self-pay | Admitting: Family Medicine

## 2017-07-19 NOTE — Telephone Encounter (Signed)
THIS CALL IS FROM (CHRISTOPHER) FROM [facility] FOR DR. Carlota Raspberry: THEY FAXED OVER A REFILL REQUEST ON 07/15/17 FOR PATIENT TO GET HIS DIABETES TESTING SUPPLIES. HE IS CALLING TO CHECK ON THE STATUS. (I EXPLAINED THAT WE WOULD PREFER TO HAVE IT SENT ELECTRONICALLY BUT THEY CAN NOT DO IT THAT WAY). SARAH WEBER SAID IT WAS ALRIGHT FOR THEM TO SEND A PAPER FAX IN THIS CASE. HE WILL REFAX IT TODAY TO [phone]. BEST PHONE [phone] ([place] ) [place]

## 2017-07-20 NOTE — Telephone Encounter (Signed)
Tony Price a refill request time frame is 48-72 business hours and the request is still waiting for the dr. approval.

## 2017-07-20 NOTE — Telephone Encounter (Signed)
I did not see any forms last week, but will be back in the office tomorrow morning and will check at that time.

## 2017-07-20 NOTE — Telephone Encounter (Signed)
Note in pt calls from [facility].

## 2017-07-22 ENCOUNTER — Telehealth: Payer: Self-pay | Admitting: Family Medicine

## 2017-07-22 NOTE — Telephone Encounter (Signed)
A lady from [facility] called asking did we receive a fax from them told her to refax and gave her [phone] fax and she said she would and that she would follow up next week on the fax.

## 2017-07-25 DIAGNOSIS — H029 Unspecified disorder of eyelid: Secondary | ICD-10-CM | POA: Diagnosis not present

## 2017-07-25 DIAGNOSIS — H402222 Chronic angle-closure glaucoma, left eye, moderate stage: Secondary | ICD-10-CM | POA: Diagnosis not present

## 2017-07-25 DIAGNOSIS — H04123 Dry eye syndrome of bilateral lacrimal glands: Secondary | ICD-10-CM | POA: Diagnosis not present

## 2017-07-25 DIAGNOSIS — H402211 Chronic angle-closure glaucoma, right eye, mild stage: Secondary | ICD-10-CM | POA: Diagnosis not present

## 2017-08-09 NOTE — Telephone Encounter (Signed)
DR Carlota Raspberry CHRIS FROM [facility] CALLING BACK ABOUT PT NEW PRESCRIPTION

## 2017-08-11 ENCOUNTER — Telehealth: Payer: Self-pay

## 2017-08-11 NOTE — Telephone Encounter (Signed)
LMOVM Cell for pt to return call. [facility] calling to get rx for DM testing supplies. Asked pt to return call to [place] advising if he is getting supplies [facility] or from [facility]???

## 2017-08-12 ENCOUNTER — Telehealth: Payer: Self-pay

## 2017-08-12 NOTE — Telephone Encounter (Signed)
Pt called and does not want his diabetic supplies to go to outside company and would like for supplies filled through [facility].Pt has refills at this time.

## 2017-08-12 NOTE — Telephone Encounter (Signed)
This has been placed in your box.

## 2017-08-15 ENCOUNTER — Telehealth: Payer: Self-pay | Admitting: Family Medicine

## 2017-08-15 NOTE — Telephone Encounter (Signed)
[facility] CALLING TO SEE IF WE HAD RECEIVED FAX FOR DIABETIC SUPPLIES

## 2017-08-16 ENCOUNTER — Ambulatory Visit (INDEPENDENT_AMBULATORY_CARE_PROVIDER_SITE_OTHER): Payer: Medicare Other | Admitting: Podiatry

## 2017-08-16 ENCOUNTER — Encounter: Payer: Self-pay | Admitting: Podiatry

## 2017-08-16 DIAGNOSIS — M79606 Pain in leg, unspecified: Secondary | ICD-10-CM

## 2017-08-16 DIAGNOSIS — M79671 Pain in right foot: Secondary | ICD-10-CM | POA: Diagnosis not present

## 2017-08-16 DIAGNOSIS — M79672 Pain in left foot: Secondary | ICD-10-CM | POA: Diagnosis not present

## 2017-08-16 DIAGNOSIS — B351 Tinea unguium: Secondary | ICD-10-CM | POA: Diagnosis not present

## 2017-08-16 DIAGNOSIS — E11319 Type 2 diabetes mellitus with unspecified diabetic retinopathy without macular edema: Secondary | ICD-10-CM | POA: Diagnosis not present

## 2017-08-16 DIAGNOSIS — M21969 Unspecified acquired deformity of unspecified lower leg: Secondary | ICD-10-CM

## 2017-08-16 NOTE — Progress Notes (Signed)
Subjective: 76 y.o. year old male patient presents requesting toe nails trimmed. He got diabetic shoe last year and still using them. Patient request for another pair for this year. Blood sugar under control and at 120 level.  Been diabetic since 2005. Had received podiatry care from [facility] in past. Does GYM exercise daily.  Objective: Dermatologic: Thick yellow deformed nails on both great toes.  Both great toe nails are too thick to manage and hurts in shoes. Vascular: Pedal pulses are all palpable. Orthopedic: Hypermobile first ray bilateral. Limited dorsiflexion first MPJ with loading of forefoot. Forefoot varus bilateral.  Neurologic: All epicritic and tactile sensations grossly intact.  Assessment: Dystrophic mycotic nails x 10. Pain in both great toes. 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 NIDDM under control.  Treatment: All mycotic nails debrided and grinded. As per request, both feet measured for diabetic shoes. Return in 3-4 months for routine foot care  or as needed.

## 2017-08-16 NOTE — Patient Instructions (Signed)
Seen for hypertrophic nails. All nails debrided and grinded. Return in 3-4 months or as needed.

## 2017-08-17 NOTE — Telephone Encounter (Signed)
[facility] [phone] ( ASKED FOR CHRISTOPHER as noted in prior messages )  Advised them patient does not want to use their company for his DM needs.  Spoke with [employer].

## 2017-08-22 ENCOUNTER — Telehealth: Payer: Self-pay | Admitting: Family Medicine

## 2017-08-22 NOTE — Telephone Encounter (Signed)
[place] - Pt dropped off form from his foot doctor.  It needs to be filled out and faxed to them.  I have put this in your box.

## 2017-08-23 NOTE — Telephone Encounter (Signed)
See below

## 2017-08-27 NOTE — Telephone Encounter (Signed)
Form completed with my info - at TL desk. Thanks.

## 2017-08-29 NOTE — Telephone Encounter (Signed)
Form faxed to [phone].  Confirmation received.

## 2017-09-14 DIAGNOSIS — M545 Low back pain: Secondary | ICD-10-CM | POA: Diagnosis not present

## 2017-09-22 DIAGNOSIS — M545 Low back pain: Secondary | ICD-10-CM | POA: Diagnosis not present

## 2017-09-28 DIAGNOSIS — N400 Enlarged prostate without lower urinary tract symptoms: Secondary | ICD-10-CM | POA: Diagnosis not present

## 2017-10-05 DIAGNOSIS — M545 Low back pain: Secondary | ICD-10-CM | POA: Diagnosis not present

## 2017-10-13 ENCOUNTER — Ambulatory Visit: Payer: Medicare Other | Admitting: Family Medicine

## 2017-10-14 DIAGNOSIS — M545 Low back pain: Secondary | ICD-10-CM | POA: Diagnosis not present

## 2017-10-21 ENCOUNTER — Other Ambulatory Visit: Payer: Self-pay

## 2017-10-21 ENCOUNTER — Ambulatory Visit (INDEPENDENT_AMBULATORY_CARE_PROVIDER_SITE_OTHER): Payer: Medicare Other | Admitting: Emergency Medicine

## 2017-10-21 ENCOUNTER — Encounter: Payer: Self-pay | Admitting: Emergency Medicine

## 2017-10-21 VITALS — BP 130/68 | HR 71 | Temp 98.6°F | Resp 16 | Ht 65.0 in | Wt 176.0 lb

## 2017-10-21 DIAGNOSIS — R05 Cough: Secondary | ICD-10-CM

## 2017-10-21 DIAGNOSIS — J069 Acute upper respiratory infection, unspecified: Secondary | ICD-10-CM

## 2017-10-21 DIAGNOSIS — R059 Cough, unspecified: Secondary | ICD-10-CM | POA: Insufficient documentation

## 2017-10-21 HISTORY — DX: Acute upper respiratory infection, unspecified: J06.9

## 2017-10-21 MED ORDER — PROMETHAZINE-CODEINE 6.25-10 MG/5ML PO SYRP
5.0000 mL | ORAL_SOLUTION | Freq: Every evening | ORAL | 0 refills | Status: DC | PRN
Start: 2017-10-21 — End: 2017-11-08

## 2017-10-21 MED ORDER — AMOXICILLIN-POT CLAVULANATE 875-125 MG PO TABS: 1.0000 | ORAL_TABLET | Freq: Two times a day (BID) | ORAL | 0 refills | Status: AC

## 2017-10-21 MED ORDER — BENZONATATE 200 MG PO CAPS: 200.0000 mg | ORAL_CAPSULE | Freq: Two times a day (BID) | ORAL | 0 refills | Status: DC | PRN

## 2017-10-21 NOTE — Progress Notes (Signed)
Tony Price 77 y.o.   Chief Complaint  Patient presents with  . Cough    X 1 DAY nonproductive    HISTORY OF PRESENT ILLNESS: This is a 77 y.o. male complaining of dry cough x 1 day. Girlfriend was sick with Bronchitis and was on antibiotics for a week.  Cough  This is a new problem. The current episode started yesterday. The problem has been waxing and waning. The problem occurs every few minutes. The cough is non-productive. Pertinent negatives include no chest pain, chills, ear congestion, fever, headaches, hemoptysis, myalgias, nasal congestion, rash, sore throat, shortness of breath, weight loss or wheezing. Risk factors: exposed to girlfriend's bronchitis. There is no history of asthma, COPD, emphysema or pneumonia.     Prior to Admission medications   Medication Sig Start Date End Date Taking? Authorizing Provider  amLODipine (NORVASC) 5 MG tablet Take 1 tablet (5 mg total) by mouth daily. 04/08/17  Yes [name], MD  Ascorbic Acid (VITAMIN C) 1000 MG tablet Take 1,000 mg by mouth daily.   Yes [provider]  aspirin 81 MG chewable tablet Chew by mouth daily.   Yes [provider]  atorvastatin (LIPITOR) 20 MG tablet Take 1 tablet (20 mg total) by mouth daily. 04/08/17  Yes [name], MD  glimepiride (AMARYL) 2 MG tablet Take 1 tablet (2 mg total) by mouth 2 (two) times daily. 04/08/17  Yes [name], MD  latanoprost (XALATAN) 0.005 % ophthalmic solution Place 1 drop into both eyes at bedtime.   Yes [provider]  quinapril (ACCUPRIL) 20 MG tablet Take 1 tablet (20 mg total) by mouth daily. 04/08/17  Yes [name], MD  triamcinolone cream (KENALOG) 0.1 % APPLY TO AFFECTED AREA TWICE A DAY 06/06/17  Yes [name], MD  amoxicillin-clavulanate (AUGMENTIN) 875-125 MG tablet Take 1 tablet by mouth 2 (two) times daily for 7 days. 10/21/17 10/28/17  Horald Pollen, MD  benzonatate (TESSALON) 200 MG capsule Take 1  capsule (200 mg total) by mouth 2 (two) times daily as needed for cough. 10/21/17   Horald Pollen, MD  blood glucose meter kit and supplies KIT 1 touch ultra meter.   Check blood sugar once daily E11.9 07/14/17   [name], MD  Blood Glucose Monitoring Suppl (ONE TOUCH ULTRA SYSTEM KIT) w/Device KIT Test blood sugar once daily. Dx: E11.9 07/01/16   [name], MD  glucose blood test strip Test blood sugar once daily. Dx: E11.9 07/04/17   [name], MD  Lancets [facility] ULTRASOFT) lancets Test blood sugar once daily. Dx: E11.9 07/04/17   [name], MD  promethazine-codeine [facility] WITH CODEINE) 6.25-10 MG/5ML syrup Take 5 mLs by mouth at bedtime as needed for cough. 10/21/17   Horald Pollen, MD    No Known Allergies  Patient Active Problem List   Diagnosis Date Noted  . Diabetic retinopathy ([place]) 03/31/2016  . Onychomycosis 02/25/2016  . UTI (lower urinary tract infection) 11/17/2014  . DM (diabetes mellitus), type 2 ([place]) 11/17/2014  . HTN (hypertension) 11/17/2014  . Sepsis ([place]) 11/17/2014    Past Medical History:  Diagnosis Date  . Diabetes mellitus   . Hypercholesterolemia   . Hypertension   . Kidney stones     Past Surgical History:  Procedure Laterality Date  . CATARACT EXTRACTION    . EYE SURGERY    . PROSTATE BIOPSY    . ROTATOR CUFF REPAIR  Right    Social History   Socioeconomic History  . Marital status: Divorced    Spouse name: Not on file  . Number of children: Not on file  . Years of education: Not on file  . Highest education level: Not on file  Social Needs  . Financial resource strain: Not on file  . Food insecurity - worry: Not on file  . Food insecurity - inability: Not on file  . Transportation needs - medical: Not on file  . Transportation needs - non-medical: Not on file  Occupational History  . Not on file  Tobacco Use  . Smoking status: Former Smoker    Years: 2.00  . Smokeless tobacco:  Never Used  Substance and Sexual Activity  . Alcohol use: No    Alcohol/week: 0.0 oz  . Drug use: No  . Sexual activity: Not on file  Other Topics Concern  . Not on file  Social History Narrative  . Not on file    Family History  Family history unknown: Yes     Review of Systems  Constitutional: Negative.  Negative for chills, fever and weight loss.  HENT: Negative.  Negative for congestion, nosebleeds and sore throat.   Eyes: Negative.   Respiratory: Positive for cough. Negative for hemoptysis, shortness of breath and wheezing.   Cardiovascular: Negative for chest pain and palpitations.  Gastrointestinal: Negative.  Negative for abdominal pain, diarrhea, nausea and vomiting.  Genitourinary: Negative.   Musculoskeletal: Negative for myalgias.  Skin: Negative for rash.  Neurological: Negative for headaches.  Endo/Heme/Allergies: Negative.   All other systems reviewed and are negative.  Vitals:   10/21/17 0934  BP: 130/68  Pulse: 71  Resp: 16  Temp: 98.6 F (37 C)  SpO2: 97%     Physical Exam  Constitutional: He is oriented to person, place, and time. He appears well-developed and well-nourished.  HENT:  Head: Normocephalic and atraumatic.  Right Ear: External ear normal.  Left Ear: External ear normal.  Nose: Nose normal.  Mouth/Throat: Oropharynx is clear and moist.  Eyes: Conjunctivae and EOM are normal. Pupils are equal, round, and reactive to light.  Neck: Normal range of motion. Neck supple. No JVD present. No thyromegaly present.  Cardiovascular: Normal rate, regular rhythm and normal heart sounds.  Pulmonary/Chest: Effort normal and breath sounds normal.  Abdominal: Soft. Bowel sounds are normal. He exhibits no distension. There is no tenderness.  Musculoskeletal: Normal range of motion.  Lymphadenopathy:    He has no cervical adenopathy.  Neurological: He is alert and oriented to person, place, and time. No sensory deficit. He exhibits normal muscle  tone. Coordination normal.  Skin: Skin is warm and dry. Capillary refill takes less than 2 seconds. No rash noted.  Psychiatric: He has a normal mood and affect. His behavior is normal.  Vitals reviewed.   A total of 25 minutes was spent in the room with the patient, greater than 50% of which was in counseling/coordination of care regarding present condition.   ASSESSMENT & PLAN: Antwoine was seen today for cough.  Diagnoses and all orders for this visit:  Cough -     benzonatate (TESSALON) 200 MG capsule; Take 1 capsule (200 mg total) by mouth 2 (two) times daily as needed for cough. -     promethazine-codeine (PHENERGAN WITH CODEINE) 6.25-10 MG/5ML syrup; Take 5 mLs by mouth at bedtime as needed for cough.  Acute upper respiratory infection -     amoxicillin-clavulanate (AUGMENTIN) 875-125 MG tablet;  Take 1 tablet by mouth 2 (two) times daily for 7 days.    Patient Instructions       IF you received an x-ray today, you will receive an invoice from [facility]. Please contact [facility] at [phone] with questions or concerns regarding your invoice.   IF you received labwork today, you will receive an invoice from [facility]. Please contact [facility] at [phone] with questions or concerns regarding your invoice.   Our billing staff will not be able to assist you with questions regarding bills from these companies.  You will be contacted with the lab results as soon as they are available. The fastest way to get your results is to activate your My Chart account. Instructions are located on the last page of this paperwork. If you have not heard from [place] regarding the results in 2 weeks, please contact this office.     Cough, Adult A cough helps to clear your throat and lungs. A cough may last only 2-3 weeks (acute), or it may last longer than 8 weeks (chronic). Many different things can cause a cough. A cough may be a sign of an illness or another medical  condition. Follow these instructions at home:  Pay attention to any changes in your cough.  Take medicines only as told by your doctor. ? If you were prescribed an antibiotic medicine, take it as told by your doctor. Do not stop taking it even if you start to feel better. ? Talk with your doctor before you try using a cough medicine.  Drink enough fluid to keep your pee (urine) clear or pale yellow.  If the air is dry, use a cold steam vaporizer or humidifier in your home.  Stay away from things that make you cough at work or at home.  If your cough is worse at night, try using extra pillows to raise your head up higher while you sleep.  Do not smoke, and try not to be around smoke. If you need help quitting, ask your doctor.  Do not have caffeine.  Do not drink alcohol.  Rest as needed. Contact a doctor if:  You have new problems (symptoms).  You cough up yellow fluid (pus).  Your cough does not get better after 2-3 weeks, or your cough gets worse.  Medicine does not help your cough and you are not sleeping well.  You have pain that gets worse or pain that is not helped with medicine.  You have a fever.  You are losing weight and you do not know why.  You have night sweats. Get help right away if:  You cough up blood.  You have trouble breathing.  Your heartbeat is very fast. This information is not intended to replace advice given to you by your health care provider. Make sure you discuss any questions you have with your health care provider. Document Released: 06/17/2011 Document Revised: 03/11/2016 Document Reviewed: 12/11/2014 Elsevier Interactive Patient Education  2018 Elsevier Inc.      Agustina Caroli, MD [facility]

## 2017-10-21 NOTE — Patient Instructions (Addendum)
     IF you received an x-ray today, you will receive an invoice from [facility]. Please contact [facility] at [phone] with questions or concerns regarding your invoice.   IF you received labwork today, you will receive an invoice from [facility]. Please contact [facility] at [phone] with questions or concerns regarding your invoice.   Our billing staff will not be able to assist you with questions regarding bills from these companies.  You will be contacted with the lab results as soon as they are available. The fastest way to get your results is to activate your My Chart account. Instructions are located on the last page of this paperwork. If you have not heard from us regarding the results in 2 weeks, please contact this office.     Cough, Adult A cough helps to clear your throat and lungs. A cough may last only 2-3 weeks (acute), or it may last longer than 8 weeks (chronic). Many different things can cause a cough. A cough may be a sign of an illness or another medical condition. Follow these instructions at home:  Pay attention to any changes in your cough.  Take medicines only as told by your doctor. ? If you were prescribed an antibiotic medicine, take it as told by your doctor. Do not stop taking it even if you start to feel better. ? Talk with your doctor before you try using a cough medicine.  Drink enough fluid to keep your pee (urine) clear or pale yellow.  If the air is dry, use a cold steam vaporizer or humidifier in your home.  Stay away from things that make you cough at work or at home.  If your cough is worse at night, try using extra pillows to raise your head up higher while you sleep.  Do not smoke, and try not to be around smoke. If you need help quitting, ask your doctor.  Do not have caffeine.  Do not drink alcohol.  Rest as needed. Contact a doctor if:  You have new problems (symptoms).  You cough up yellow fluid  (pus).  Your cough does not get better after 2-3 weeks, or your cough gets worse.  Medicine does not help your cough and you are not sleeping well.  You have pain that gets worse or pain that is not helped with medicine.  You have a fever.  You are losing weight and you do not know why.  You have night sweats. Get help right away if:  You cough up blood.  You have trouble breathing.  Your heartbeat is very fast. This information is not intended to replace advice given to you by your health care provider. Make sure you discuss any questions you have with your health care provider. Document Released: 06/17/2011 Document Revised: 03/11/2016 Document Reviewed: 12/11/2014 Elsevier Interactive Patient Education  2018 Elsevier Inc.  

## 2017-10-27 ENCOUNTER — Telehealth: Payer: Self-pay | Admitting: Family Medicine

## 2017-10-27 NOTE — Telephone Encounter (Signed)
[facility] are going to call Dr. Carlota Raspberry to let him know pt's scripts now need to be sent to them, no longer [facility]. Medicare will pay if we send to [facility].

## 2017-11-04 NOTE — Telephone Encounter (Signed)
Copied from [place] [phone]. Topic: Inquiry >> Nov 04, 2017 12:10 PM Patrice Paradise wrote: Reason for CRM: [facility] [phone] called to see if the office got the fax the faxed over on Jan 10th and today for new orders for the patient's diabetic supplies.

## 2017-11-05 NOTE — Telephone Encounter (Signed)
Pt needs follow up visit

## 2017-11-08 ENCOUNTER — Encounter: Payer: Self-pay | Admitting: Family Medicine

## 2017-11-08 ENCOUNTER — Ambulatory Visit (INDEPENDENT_AMBULATORY_CARE_PROVIDER_SITE_OTHER): Payer: Medicare Other | Admitting: Family Medicine

## 2017-11-08 ENCOUNTER — Other Ambulatory Visit: Payer: Self-pay

## 2017-11-08 VITALS — BP 146/78 | HR 78 | Temp 98.8°F | Resp 17 | Ht 65.0 in | Wt 179.0 lb

## 2017-11-08 DIAGNOSIS — I10 Essential (primary) hypertension: Secondary | ICD-10-CM

## 2017-11-08 DIAGNOSIS — E785 Hyperlipidemia, unspecified: Secondary | ICD-10-CM | POA: Diagnosis not present

## 2017-11-08 DIAGNOSIS — E119 Type 2 diabetes mellitus without complications: Secondary | ICD-10-CM | POA: Diagnosis not present

## 2017-11-08 DIAGNOSIS — E11319 Type 2 diabetes mellitus with unspecified diabetic retinopathy without macular edema: Secondary | ICD-10-CM | POA: Diagnosis not present

## 2017-11-08 LAB — POCT GLYCOSYLATED HEMOGLOBIN (HGB A1C): Hemoglobin A1C: 7.2

## 2017-11-08 MED ORDER — QUINAPRIL HCL 20 MG PO TABS: 20.0000 mg | ORAL_TABLET | Freq: Every day | ORAL | 1 refills | Status: DC

## 2017-11-08 MED ORDER — ATORVASTATIN CALCIUM 20 MG PO TABS: 20.0000 mg | ORAL_TABLET | Freq: Every day | ORAL | 1 refills | Status: DC

## 2017-11-08 MED ORDER — AMLODIPINE BESYLATE 5 MG PO TABS: 5.0000 mg | ORAL_TABLET | Freq: Every day | ORAL | 1 refills | Status: DC

## 2017-11-08 MED ORDER — GLIMEPIRIDE 2 MG PO TABS: 2.0000 mg | ORAL_TABLET | Freq: Two times a day (BID) | ORAL | 1 refills | Status: DC

## 2017-11-08 NOTE — Patient Instructions (Addendum)
No change in medications for now. Thank you for coming in today. Follow-up in 3 months.    IF you received an x-ray today, you will receive an invoice from [facility]. Please contact [facility] at [phone] with questions or concerns regarding your invoice.   IF you received labwork today, you will receive an invoice from [facility]. Please contact [facility] at [phone] with questions or concerns regarding your invoice.   Our billing staff will not be able to assist you with questions regarding bills from these companies.  You will be contacted with the lab results as soon as they are available. The fastest way to get your results is to activate your My Chart account. Instructions are located on the last page of this paperwork. If you have not heard from [place] regarding the results in 2 weeks, please contact this office.    1

## 2017-11-08 NOTE — Progress Notes (Signed)
Subjective:  By signing my name below, I, Essence Howell, attest that this documentation has been prepared under the direction and in the presence of [name], MD Electronically Signed: Ladene Artist, ED Scribe 11/08/2017 at 8:28 AM.   Patient ID: Tony Price, male    DOB: 09-10-1941, 77 y.o.   MRN: [id number]  Chief Complaint  Patient presents with  . Medication check   HPI Tony Price is a 77 y.o. male who presents to Primary Care at [facility] for a medication refill. Pt is fasting at this visit.  DM with Retinopathy Lab Results  Component Value Date   HGBA1C 7.0 (H) 07/15/2017   Lab Results  Component Value Date   MICROALBUR 1.1 08/04/2015  Did not tolerate metformin. Continued on Amaryl 10 mg bid. Did complete a form for diabetic shoes last yr. Routinely followed by optho with retinopathy with the last visit being ~1 month ago. Pt has been checking his blood glucose with average readings of 120s-130s, occasionally around 98. Denies hypoglycemic symptoms.  Hyperlipidemia Lab Results  Component Value Date   CHOL 158 04/09/2017   HDL 63 04/09/2017   LDLCALC 82 04/09/2017   TRIG 66 04/09/2017   CHOLHDL 2.5 04/09/2017   Lab Results  Component Value Date   ALT 9 04/09/2017   AST 17 04/09/2017   ALKPHOS 82 04/09/2017   BILITOT 0.6 04/09/2017  Continued Lipitor 20 mg qd.  HTN Accupril 20 mg qd and amlodipine 5 mg qd. He has not taken his BP meds yet, usually takes them around 10 AM after exercising which he is doing 3-4 days/wk. Denies lightheadedness, dizziness, cp, sob, other symptoms. Lab Results  Component Value Date   CREATININE 1.05 04/09/2017   Patient Active Problem List   Diagnosis Date Noted  . Cough 10/21/2017  . Acute upper respiratory infection 10/21/2017  . Diabetic retinopathy ([place]) 03/31/2016  . Onychomycosis 02/25/2016  . UTI (lower urinary tract infection) 11/17/2014  . DM (diabetes mellitus), type 2 ([place]) 11/17/2014  . HTN  (hypertension) 11/17/2014  . Sepsis ([place]) 11/17/2014   Past Medical History:  Diagnosis Date  . Diabetes mellitus   . Hypercholesterolemia   . Hypertension   . Kidney stones    Past Surgical History:  Procedure Laterality Date  . CATARACT EXTRACTION    . EYE SURGERY    . PROSTATE BIOPSY    . ROTATOR CUFF REPAIR     Right   No Known Allergies Prior to Admission medications   Medication Sig Start Date End Date Taking? Authorizing Provider  amLODipine (NORVASC) 5 MG tablet Take 1 tablet (5 mg total) by mouth daily. 04/08/17   [name], MD  Ascorbic Acid (VITAMIN C) 1000 MG tablet Take 1,000 mg by mouth daily.    [provider]  aspirin 81 MG chewable tablet Chew by mouth daily.    [provider]  atorvastatin (LIPITOR) 20 MG tablet Take 1 tablet (20 mg total) by mouth daily. 04/08/17   [name], MD  benzonatate (TESSALON) 200 MG capsule Take 1 capsule (200 mg total) by mouth 2 (two) times daily as needed for cough. 10/21/17   Horald Pollen, MD  blood glucose meter kit and supplies KIT 1 touch ultra meter.   Check blood sugar once daily E11.9 07/14/17   [name], MD  Blood Glucose Monitoring Suppl (ONE TOUCH ULTRA SYSTEM KIT) w/Device KIT Test blood sugar once daily. Dx: E11.9 07/01/16   [name], MD  glimepiride (AMARYL) 2 MG tablet Take 1 tablet (2 mg total) by mouth 2 (two) times daily. 04/08/17   [name], MD  glucose blood test strip Test blood sugar once daily. Dx: E11.9 07/04/17   [name], MD  Lancets [facility] ULTRASOFT) lancets Test blood sugar once daily. Dx: E11.9 07/04/17   [name], MD  latanoprost (XALATAN) 0.005 % ophthalmic solution Place 1 drop into both eyes at bedtime.    [provider]  promethazine-codeine (PHENERGAN WITH CODEINE) 6.25-10 MG/5ML syrup Take 5 mLs by mouth at bedtime as needed for cough. 10/21/17   Horald Pollen, MD  quinapril (ACCUPRIL) 20 MG tablet  Take 1 tablet (20 mg total) by mouth daily. 04/08/17   [name], MD  triamcinolone cream (KENALOG) 0.1 % APPLY TO AFFECTED AREA TWICE A DAY 06/06/17   [name], MD   Social History   Socioeconomic History  . Marital status: Divorced    Spouse name: Not on file  . Number of children: Not on file  . Years of education: Not on file  . Highest education level: Not on file  Social Needs  . Financial resource strain: Not on file  . Food insecurity - worry: Not on file  . Food insecurity - inability: Not on file  . Transportation needs - medical: Not on file  . Transportation needs - non-medical: Not on file  Occupational History  . Not on file  Tobacco Use  . Smoking status: Former Smoker    Years: 2.00  . Smokeless tobacco: Never Used  Substance and Sexual Activity  . Alcohol use: No    Alcohol/week: 0.0 oz  . Drug use: No  . Sexual activity: Not on file  Other Topics Concern  . Not on file  Social History Narrative  . Not on file   Review of Systems  Constitutional: Negative for fatigue and unexpected weight change.  Eyes: Negative for visual disturbance.  Respiratory: Negative for cough, chest tightness and shortness of breath.   Cardiovascular: Negative for chest pain, palpitations and leg swelling.  Gastrointestinal: Negative for abdominal pain and blood in stool.  Neurological: Negative for dizziness, light-headedness and headaches.      Objective:   Physical Exam  Constitutional: He is oriented to person, place, and time. He appears well-developed and well-nourished.  HENT:  Head: Normocephalic and atraumatic.  Eyes: EOM are normal. Pupils are equal, round, and reactive to light.  Neck: No JVD present. Carotid bruit is not present.  Cardiovascular: Normal rate and regular rhythm.  Murmur heard.  Systolic (faint) murmur is present with a grade of 2/6. Pulmonary/Chest: Effort normal and breath sounds normal. He has no rales.  Musculoskeletal: He  exhibits no edema.  Neurological: He is alert and oriented to person, place, and time.  Skin: Skin is warm and dry.  Psychiatric: He has a normal mood and affect.  Vitals reviewed.  Vitals:   11/08/17 0811  BP: (!) 144/80  Pulse: 78  Resp: 17  Temp: 98.8 F (37.1 C)  TempSrc: Oral  SpO2: 97%  Weight: 179 lb (81.2 kg)  Height: '5\' 5"'  (1.651 m)      Assessment & Plan:   Tony Price is a 77 y.o. male Type 2 diabetes mellitus with retinopathy, without long-term current use of insulin, macular edema presence unspecified, unspecified laterality, unspecified retinopathy severity ([place]) - Plan: POCT glycosylated hemoglobin (Hb A1C), Microalbumin, urine, Hemoglobin A1c  - Tolerating current regimen, check A1c,  microalbumin  Essential hypertension - Plan: Comprehensive metabolic panel, quinapril (ACCUPRIL) 20 MG tablet, amLODipine (NORVASC) 5 MG tablet  - Borderline, but has not taken medication yet this morning. Continue same doses, monitor home readings  Hyperlipidemia, unspecified hyperlipidemia type - Plan: Comprehensive metabolic panel, Lipid panel, atorvastatin (LIPITOR) 20 MG tablet  -Tolerating statin, no change in dose, labs pending.  At end of visit he mentioned flank pain he has discussed in the past. Plans to follow-up at separate visit to discuss this further. RTC precautions if any acute worsening.   Meds ordered this encounter  Medications  . quinapril (ACCUPRIL) 20 MG tablet    Sig: Take 1 tablet (20 mg total) by mouth daily.    Dispense:  90 tablet    Refill:  1  . glimepiride (AMARYL) 2 MG tablet    Sig: Take 1 tablet (2 mg total) by mouth 2 (two) times daily.    Dispense:  180 tablet    Refill:  1  . atorvastatin (LIPITOR) 20 MG tablet    Sig: Take 1 tablet (20 mg total) by mouth daily.    Dispense:  90 tablet    Refill:  1  . amLODipine (NORVASC) 5 MG tablet    Sig: Take 1 tablet (5 mg total) by mouth daily.    Dispense:  90 tablet    Refill:  1    Patient Instructions   No change in medications for now. Thank you for coming in today. Follow-up in 3 months.    IF you received an x-ray today, you will receive an invoice from [facility]. Please contact [facility] at [phone] with questions or concerns regarding your invoice.   IF you received labwork today, you will receive an invoice from [facility]. Please contact [facility] at [phone] with questions or concerns regarding your invoice.   Our billing staff will not be able to assist you with questions regarding bills from these companies.  You will be contacted with the lab results as soon as they are available. The fastest way to get your results is to activate your My Chart account. Instructions are located on the last page of this paperwork. If you have not heard from [place] regarding the results in 2 weeks, please contact this office.    1  I personally performed the services described in this documentation, which was scribed in my presence. The recorded information has been reviewed and considered for accuracy and completeness, addended by me as needed, and agree with information above.  Signed,   [name], MD Primary Care at [place].  11/08/17 8:40 AM

## 2017-11-09 LAB — COMPREHENSIVE METABOLIC PANEL
ALK PHOS: 86 IU/L (ref 39–117)
ALT: 18 IU/L (ref 0–44)
AST: 22 IU/L (ref 0–40)
Albumin/Globulin Ratio: 1.5 (ref 1.2–2.2)
Albumin: 4.1 g/dL (ref 3.5–4.8)
BILIRUBIN TOTAL: 0.4 mg/dL (ref 0.0–1.2)
BUN/Creatinine Ratio: 8 — ABNORMAL LOW (ref 10–24)
BUN: 10 mg/dL (ref 8–27)
CHLORIDE: 105 mmol/L (ref 96–106)
CO2: 25 mmol/L (ref 20–29)
Calcium: 9.2 mg/dL (ref 8.6–10.2)
Creatinine, Ser: 1.22 mg/dL (ref 0.76–1.27)
GFR calc Af Amer: 66 mL/min/{1.73_m2} (ref >59)
GFR calc non Af Amer: 57 mL/min/{1.73_m2} — ABNORMAL LOW (ref >59)
GLUCOSE: 113 mg/dL — AB (ref 65–99)
Globulin, Total: 2.8 g/dL (ref 1.5–4.5)
Potassium: 4.8 mmol/L (ref 3.5–5.2)
Sodium: 145 mmol/L — ABNORMAL HIGH (ref 134–144)
Total Protein: 6.9 g/dL (ref 6.0–8.5)

## 2017-11-09 LAB — LIPID PANEL
CHOLESTEROL TOTAL: 165 mg/dL (ref 100–199)
Chol/HDL Ratio: 2.4 ratio (ref 0.0–5.0)
HDL: 70 mg/dL (ref >39)
LDL Calculated: 81 mg/dL (ref 0–99)
TRIGLYCERIDES: 72 mg/dL (ref 0–149)
VLDL CHOLESTEROL CAL: 14 mg/dL (ref 5–40)

## 2017-11-09 LAB — HEMOGLOBIN A1C
ESTIMATED AVERAGE GLUCOSE: 154 mg/dL
HEMOGLOBIN A1C: 7 % — AB (ref 4.8–5.6)

## 2017-11-09 LAB — MICROALBUMIN, URINE: MICROALBUM., U, RANDOM: 11.3 ug/mL

## 2017-12-16 DIAGNOSIS — R109 Unspecified abdominal pain: Secondary | ICD-10-CM | POA: Diagnosis not present

## 2017-12-22 ENCOUNTER — Telehealth: Payer: Self-pay

## 2017-12-22 NOTE — Telephone Encounter (Signed)
   Copied from [place] [id number]. Topic: Inquiry >> Dec 20, 2017  9:45 AM Arletha Grippe wrote: Reason for CRM: lindsey from [facility] called regarding status of fax that was sent over on Feb 13.  This is regarding a back brace - please call back at [phone]

## 2017-12-22 NOTE — Telephone Encounter (Signed)
Phone call to patient. He states he did, in fact, request a back brace. RN will contact [employer] [facility] to have form sent again, he is appreciative.   Phone call to [facility]. Spoke with Vernie Shanks, she states she will fax over form. Abigail given correct fax number.

## 2017-12-24 DIAGNOSIS — M546 Pain in thoracic spine: Secondary | ICD-10-CM | POA: Diagnosis not present

## 2017-12-30 DIAGNOSIS — M545 Low back pain: Secondary | ICD-10-CM | POA: Diagnosis not present

## 2018-01-03 ENCOUNTER — Telehealth: Payer: Self-pay

## 2018-01-03 NOTE — Telephone Encounter (Signed)
Received fax from [facility] request form be completed to provide pt with back brace.  L/m for pt that if he needs this form completed, please schedule OV with [place] so that he can be evaluated.

## 2018-01-16 DIAGNOSIS — M545 Low back pain: Secondary | ICD-10-CM | POA: Diagnosis not present

## 2018-02-07 ENCOUNTER — Encounter: Payer: Self-pay | Admitting: Family Medicine

## 2018-02-07 ENCOUNTER — Ambulatory Visit (INDEPENDENT_AMBULATORY_CARE_PROVIDER_SITE_OTHER): Payer: Medicare Other | Admitting: Family Medicine

## 2018-02-07 VITALS — BP 128/82 | HR 61 | Temp 98.1°F | Resp 17 | Ht 65.0 in | Wt 180.0 lb

## 2018-02-07 DIAGNOSIS — E785 Hyperlipidemia, unspecified: Secondary | ICD-10-CM

## 2018-02-07 DIAGNOSIS — I10 Essential (primary) hypertension: Secondary | ICD-10-CM | POA: Diagnosis not present

## 2018-02-07 DIAGNOSIS — R011 Cardiac murmur, unspecified: Secondary | ICD-10-CM | POA: Diagnosis not present

## 2018-02-07 DIAGNOSIS — E11319 Type 2 diabetes mellitus with unspecified diabetic retinopathy without macular edema: Secondary | ICD-10-CM

## 2018-02-07 LAB — HEMOGLOBIN A1C
Est. average glucose Bld gHb Est-mCnc: 160 mg/dL
Hgb A1c MFr Bld: 7.2 % — ABNORMAL HIGH (ref 4.8–5.6)

## 2018-02-07 NOTE — Patient Instructions (Addendum)
No change in medications for now.  I will check your hemoglobin A1c to see if any dose adjustments are needed.  You did have a faint heart murmur, unlikely to be of any concern, but I will schedule an echocardiogram to look into that further.  Let me know if you have any questions.  Follow-up in 3 months for diabetes and fasting lab work at that time.   Heart Murmur A heart murmur is an extra sound that is caused by chaotic blood flow. The murmur can be heard as a "hum" or "whoosh" sound when blood flows through the heart. The heart has four areas called chambers. Valves separate the upper and lower chambers from each other (tricuspid valve and mitral valve) and separate the lower chambers of the heart from pathways that lead away from the heart (aortic valve and pulmonary valve). Normally, the valves open to let blood flow through or out of your heart, and then they shut to keep the blood from flowing backward. There are two types of heart murmurs:  Innocent murmurs. Most people with this type of heart murmur do not have a heart problem. Many children have innocent heart murmurs. Your health care provider may suggest some basic testing to find out whether your murmur is an innocent murmur. If an innocent heart murmur is found, there is no need for further tests or treatment and no need to restrict activities or stop playing sports.  Abnormal murmurs. These types of murmurs can occur in children and adults. Abnormal murmurs may be a sign of a more serious heart condition, such as a heart defect present at birth (congenital defect) or heart valve disease.  What are the causes? This condition is caused by heart valves that are not working properly. In children, abnormal heart murmurs are typically caused by congenital defects. In adults, abnormal murmurs are usually from heart valve problems caused by disease, infection, or aging. Three types of heart valve defects can cause a  murmur:  Regurgitation. This is when blood leaks back through the valve in the wrong direction.  Mitral valve prolapse. This is when the mitral valve of the heart has a loose flap and does not close tightly.  Stenosis. This is when a valve does not open enough and blocks blood flow.  This condition may also be caused by:  Pregnancy.  Fever.  Overactive thyroid gland.  Anemia.  Exercise.  Rapid growth spurts (in children).  What are the signs or symptoms? Innocent murmurs do not cause symptoms, and many people with abnormal murmurs may or may not have symptoms. If symptoms do develop, they may include:  Shortness of breath.  Blue coloring of the skin, especially on the fingertips.  Chest pain.  Palpitations, or feeling a fluttering or skipped heartbeat.  Fainting.  Persistent cough.  Getting tired much faster than expected.  Swelling in the abdomen, feet, or ankles.  How is this diagnosed? This condition may be diagnosed during a routine physical or other exam. If your health care provider hears a murmur with a stethoscope, he or she will listen for:  Where the murmur is located in your heart.  How long the murmur lasts (duration).  When the murmur is heard during the heartbeat.  How loud the murmur is. This may help the health care provider figure out what is causing the murmur.  You may be referred to a heart specialist (cardiologist). You may also have other tests, including:  Electrocardiogram (ECG or EKG). This test  measures the electrical activity of your heart.  Echocardiogram. This test uses high frequency sound waves to make pictures of your heart.  MRI or chest X-ray.  Cardiac catheterization. This test looks at blood flow through the heart.  For children and adults who have an abnormal heart murmur and want to stay active, it is important to complete testing, review test results, and receive recommendations from your health care provider. If  heart disease is present, it may not be safe to play or be active. How is this treated? Heart murmurs themselves do not need treatment. In some cases, a heart murmur may go away on its own. If an underlying problem or disease is causing the murmur, you may need treatment. If treatment is needed, it will depend on the type and severity of the disease or heart problem causing the murmur. Treatment may include:  Medicine.  Surgery.  Dietary and lifestyle changes.  Follow these instructions at home:  Talk with your health care provider before participating in sports or other activities that require a lot of effort and energy (are strenuous).  Learn as much as possible about your condition and any related diseases. Ask your health care provider if you may at risk for any medical emergencies.  Talk with your health care provider about what symptoms you should look out for.  It is up to you to get your test results. Ask your health care provider, or the department that is doing the test, when your results will be ready.  Keep all follow-up visits as told by your health care provider. This is important. Contact a health care provider if:  You feel light-headed.  You are frequently short of breath.  You feel more tired than usual.  You are having a hard time keeping up with normal activities or fitness routines.  You have swelling in your ankles or feet.  You have chest pain.  You notice that your heart often beats irregularly.  You develop any new symptoms. Get help right away if:  You develop severe chest pain.  You are having trouble breathing.  You have fainting spells.  Your symptoms suddenly get worse. These symptoms may represent a serious problem that is an emergency. Do not wait to see if the symptoms will go away. Get medical help right away. Call your local emergency services (911 in the [place] Do not drive yourself to the hospital. Summary  Normally, the heart  valves open to let blood flow through or out of your heart, and then they shut to keep the blood from flowing backward.  Heart murmur is caused by heart valves that are not working properly.  You may need treatment if an underlying problem or disease is causing the heart murmur. Treatment may include medicine, surgery, or dietary and lifestyle changes.  Talk with your health care provider before participating in sports or other activities that require a lot of effort and energy (are strenuous).  Talk with your health care provider about what symptoms you should watch out for. This information is not intended to replace advice given to you by your health care provider. Make sure you discuss any questions you have with your health care provider. Document Released: 11/11/2004 Document Revised: 09/22/2016 Document Reviewed: 09/22/2016 Elsevier Interactive Patient Education  2018 [employer].   IF you received an x-ray today, you will receive an invoice from [facility]. Please contact [facility] at [phone] with questions or concerns regarding your invoice.   IF you  received labwork today, you will receive an invoice from [place]. Please contact [facility] at [phone] with questions or concerns regarding your invoice.   Our billing staff will not be able to assist you with questions regarding bills from these companies.  You will be contacted with the lab results as soon as they are available. The fastest way to get your results is to activate your My Chart account. Instructions are located on the last page of this paperwork. If you have not heard from [place] regarding the results in 2 weeks, please contact this office.

## 2018-02-07 NOTE — Progress Notes (Signed)
Subjective:  By signing my name below, I, Essence Howell, attest that this documentation has been prepared under the direction and in the presence of [name], MD Electronically Signed: Ladene Artist, ED Scribe 02/07/2018 at 9:44 AM.   Patient ID: Tony Price, male    DOB: [birth year], 77 y.o.   MRN: [id number]  Chief Complaint  Patient presents with  . Follow-up    diabetes    HPI Tony Price is a 77 y.o. male who presents to Primary Care at [facility] for f/u on DM. Last OV 1/22. 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 On Amaryl 2 mg bid. Urine micro albumin was normal on 1/22. followed routinely by optho for retinopathy. Has not had pneumonia vaccine. Does take statin and ACE inhibitor. Last foot exam was 03/2017. Lab Results  Component Value Date   HGBA1C 7.0 (H) 11/08/2017   Pt reports average blood glucose of 118-125, 140 max with eating sweet foods. Next visit with optho is next month. Pt refuses pneumonia and shingles vaccines today.  HTN BP Readings from Last 3 Encounters:  02/07/18 128/82  11/08/17 (!) 146/78  10/21/17 130/68   Lab Results  Component Value Date   CREATININE 1.22 11/08/2017  Pt reports that he has had a heart murmur since high school. Denies cp, sob, lightheadedness, dizziness, any other symptoms. Denies new side-effects from medication.  Hyperlipidemia Lab Results  Component Value Date   CHOL 165 11/08/2017   HDL 70 11/08/2017   LDLCALC 81 11/08/2017   TRIG 72 11/08/2017   CHOLHDL 2.4 11/08/2017   Lab Results  Component Value Date   ALT 18 11/08/2017   AST 22 11/08/2017   ALKPHOS 86 11/08/2017   BILITOT 0.4 11/08/2017  Lipitor 20 mg qd. Denies new side-effects.  Patient Active Problem List   Diagnosis Date Noted  . Cough 10/21/2017  . Acute upper respiratory infection 10/21/2017  . Diabetic retinopathy ([place]) 03/31/2016  . Onychomycosis 02/25/2016  . UTI (lower urinary tract infection) 11/17/2014   . DM (diabetes mellitus), type 2 ([place]) 11/17/2014  . HTN (hypertension) 11/17/2014  . Sepsis ([place]) 11/17/2014   Past Medical History:  Diagnosis Date  . Diabetes mellitus   . Hypercholesterolemia   . Hypertension   . Kidney stones    Past Surgical History:  Procedure Laterality Date  . CATARACT EXTRACTION    . EYE SURGERY    . PROSTATE BIOPSY    . ROTATOR CUFF REPAIR     Right   No Known Allergies Prior to Admission medications   Medication Sig Start Date End Date Taking? Authorizing Provider  amLODipine (NORVASC) 5 MG tablet Take 1 tablet (5 mg total) by mouth daily. 11/08/17   [name], MD  Ascorbic Acid (VITAMIN C) 1000 MG tablet Take 1,000 mg by mouth daily.    [provider]  aspirin 81 MG chewable tablet Chew by mouth daily.    [provider]  atorvastatin (LIPITOR) 20 MG tablet Take 1 tablet (20 mg total) by mouth daily. 11/08/17   [name], MD  benzonatate (TESSALON) 200 MG capsule Take 1 capsule (200 mg total) by mouth 2 (two) times daily as needed for cough. 10/21/17   Horald Pollen, MD  blood glucose meter kit and supplies KIT 1 touch ultra meter.   Check blood sugar once daily E11.9 07/14/17   [name], MD  Blood Glucose Monitoring Suppl (ONE TOUCH ULTRA SYSTEM KIT) w/Device KIT Test blood  sugar once daily. Dx: E11.9 07/01/16   [name], MD  glimepiride (AMARYL) 2 MG tablet Take 1 tablet (2 mg total) by mouth 2 (two) times daily. 11/08/17   [name], MD  glucose blood test strip Test blood sugar once daily. Dx: E11.9 07/04/17   [name], MD  Lancets [facility] ULTRASOFT) lancets Test blood sugar once daily. Dx: E11.9 07/04/17   [name], MD  latanoprost (XALATAN) 0.005 % ophthalmic solution Place 1 drop into both eyes at bedtime.    [provider]  quinapril (ACCUPRIL) 20 MG tablet Take 1 tablet (20 mg total) by mouth daily. 11/08/17   [name], MD   Social History    Socioeconomic History  . Marital status: Divorced    Spouse name: Not on file  . Number of children: Not on file  . Years of education: Not on file  . Highest education level: Not on file  Occupational History  . Not on file  Social Needs  . Financial resource strain: Not on file  . Food insecurity:    Worry: Not on file    Inability: Not on file  . Transportation needs:    Medical: Not on file    Non-medical: Not on file  Tobacco Use  . Smoking status: Former Smoker    Years: 2.00  . Smokeless tobacco: Never Used  Substance and Sexual Activity  . Alcohol use: No    Alcohol/week: 0.0 oz  . Drug use: No  . Sexual activity: Not on file  Lifestyle  . Physical activity:    Days per week: Not on file    Minutes per session: Not on file  . Stress: Not on file  Relationships  . Social connections:    Talks on phone: Not on file    Gets together: Not on file    Attends religious service: Not on file    Active member of club or organization: Not on file    Attends meetings of clubs or organizations: Not on file    Relationship status: Not on file  . Intimate partner violence:    Fear of current or ex partner: Not on file    Emotionally abused: Not on file    Physically abused: Not on file    Forced sexual activity: Not on file  Other Topics Concern  . Not on file  Social History Narrative  . Not on file   Review of Systems  Constitutional: Negative for fatigue and unexpected weight change.  Eyes: Negative for visual disturbance.  Respiratory: Negative for cough, chest tightness and shortness of breath.   Cardiovascular: Negative for chest pain, palpitations and leg swelling.  Gastrointestinal: Negative for abdominal pain and blood in stool.  Neurological: Negative for dizziness, light-headedness and headaches.      Objective:   Physical Exam  Constitutional: He is oriented to person, place, and time. He appears well-developed and well-nourished.  HENT:  Head:  Normocephalic and atraumatic.  Eyes: Pupils are equal, round, and reactive to light. EOM are normal.  Neck: No JVD present. Carotid bruit is not present.  Cardiovascular: Normal rate and regular rhythm.  Murmur heard.  Systolic murmur is present with a grade of 2/6. Pulmonary/Chest: Effort normal and breath sounds normal. He has no rales.  Musculoskeletal: He exhibits no edema.  Neurological: He is alert and oriented to person, place, and time.  Skin: Skin is warm and dry.  Psychiatric: He has a normal mood  and affect.  Vitals reviewed.  Vitals:   02/07/18 0922  BP: 128/82  Pulse: 61  Resp: 17  Temp: 98.1 F (36.7 C)  TempSrc: Oral  SpO2: 98%  Weight: 180 lb (81.6 kg)  Height: '5\' 5"'  (1.651 m)      Assessment & Plan:   Tony Price is a 77 y.o. male Type 2 diabetes mellitus with retinopathy, without long-term current use of insulin, macular edema presence unspecified, unspecified laterality, unspecified retinopathy severity ([place]) - Plan: Hemoglobin A1c  -Overall controlled when last checked.  Could strive for slightly improved control with history of retinopathy, watch for hypoglycemia.  Continue Amaryl same dose for now, check A1c, follow-up with retinal specialist as planned.  Pneumonia vaccine and other vaccines were discussed but declined.  Hyperlipidemia, unspecified hyperlipidemia type  -Tolerating statin, no dose changes.  Plan on fasting lab work next visit  Essential hypertension  -Stable, no med changes.  Renal function testing next visit  Systolic murmur - Plan: ECHOCARDIOGRAM COMPLETE  -Reports possible long-standing murmur, will check echocardiogram to rule out valvular cause versus stenosis.  Asymptomatic at present.  No orders of the defined types were placed in this encounter.  Patient Instructions   No change in medications for now.  I will check your hemoglobin A1c to see if any dose adjustments are needed.  You did have a faint heart murmur,  unlikely to be of any concern, but I will schedule an echocardiogram to look into that further.  Let me know if you have any questions.  Follow-up in 3 months for diabetes and fasting lab work at that time.   Heart Murmur A heart murmur is an extra sound that is caused by chaotic blood flow. The murmur can be heard as a "hum" or "whoosh" sound when blood flows through the heart. The heart has four areas called chambers. Valves separate the upper and lower chambers from each other (tricuspid valve and mitral valve) and separate the lower chambers of the heart from pathways that lead away from the heart (aortic valve and pulmonary valve). Normally, the valves open to let blood flow through or out of your heart, and then they shut to keep the blood from flowing backward. There are two types of heart murmurs:  Innocent murmurs. Most people with this type of heart murmur do not have a heart problem. Many children have innocent heart murmurs. Your health care provider may suggest some basic testing to find out whether your murmur is an innocent murmur. If an innocent heart murmur is found, there is no need for further tests or treatment and no need to restrict activities or stop playing sports.  Abnormal murmurs. These types of murmurs can occur in children and adults. Abnormal murmurs may be a sign of a more serious heart condition, such as a heart defect present at birth (congenital defect) or heart valve disease.  What are the causes? This condition is caused by heart valves that are not working properly. In children, abnormal heart murmurs are typically caused by congenital defects. In adults, abnormal murmurs are usually from heart valve problems caused by disease, infection, or aging. Three types of heart valve defects can cause a murmur:  Regurgitation. This is when blood leaks back through the valve in the wrong direction.  Mitral valve prolapse. This is when the mitral valve of the heart has a  loose flap and does not close tightly.  Stenosis. This is when a valve does not open enough and blocks  blood flow.  This condition may also be caused by:  Pregnancy.  Fever.  Overactive thyroid gland.  Anemia.  Exercise.  Rapid growth spurts (in children).  What are the signs or symptoms? Innocent murmurs do not cause symptoms, and many people with abnormal murmurs may or may not have symptoms. If symptoms do develop, they may include:  Shortness of breath.  Blue coloring of the skin, especially on the fingertips.  Chest pain.  Palpitations, or feeling a fluttering or skipped heartbeat.  Fainting.  Persistent cough.  Getting tired much faster than expected.  Swelling in the abdomen, feet, or ankles.  How is this diagnosed? This condition may be diagnosed during a routine physical or other exam. If your health care provider hears a murmur with a stethoscope, he or she will listen for:  Where the murmur is located in your heart.  How long the murmur lasts (duration).  When the murmur is heard during the heartbeat.  How loud the murmur is. This may help the health care provider figure out what is causing the murmur.  You may be referred to a heart specialist (cardiologist). You may also have other tests, including:  Electrocardiogram (ECG or EKG). This test measures the electrical activity of your heart.  Echocardiogram. This test uses high frequency sound waves to make pictures of your heart.  MRI or chest X-ray.  Cardiac catheterization. This test looks at blood flow through the heart.  For children and adults who have an abnormal heart murmur and want to stay active, it is important to complete testing, review test results, and receive recommendations from your health care provider. If heart disease is present, it may not be safe to play or be active. How is this treated? Heart murmurs themselves do not need treatment. In some cases, a heart murmur may go  away on its own. If an underlying problem or disease is causing the murmur, you may need treatment. If treatment is needed, it will depend on the type and severity of the disease or heart problem causing the murmur. Treatment may include:  Medicine.  Surgery.  Dietary and lifestyle changes.  Follow these instructions at home:  Talk with your health care provider before participating in sports or other activities that require a lot of effort and energy (are strenuous).  Learn as much as possible about your condition and any related diseases. Ask your health care provider if you may at risk for any medical emergencies.  Talk with your health care provider about what symptoms you should look out for.  It is up to you to get your test results. Ask your health care provider, or the department that is doing the test, when your results will be ready.  Keep all follow-up visits as told by your health care provider. This is important. Contact a health care provider if:  You feel light-headed.  You are frequently short of breath.  You feel more tired than usual.  You are having a hard time keeping up with normal activities or fitness routines.  You have swelling in your ankles or feet.  You have chest pain.  You notice that your heart often beats irregularly.  You develop any new symptoms. Get help right away if:  You develop severe chest pain.  You are having trouble breathing.  You have fainting spells.  Your symptoms suddenly get worse. These symptoms may represent a serious problem that is an emergency. Do not wait to see if the symptoms will  go away. Get medical help right away. Call your local emergency services (911 in the [place] Do not drive yourself to the hospital. Summary  Normally, the heart valves open to let blood flow through or out of your heart, and then they shut to keep the blood from flowing backward.  Heart murmur is caused by heart valves that are not  working properly.  You may need treatment if an underlying problem or disease is causing the heart murmur. Treatment may include medicine, surgery, or dietary and lifestyle changes.  Talk with your health care provider before participating in sports or other activities that require a lot of effort and energy (are strenuous).  Talk with your health care provider about what symptoms you should watch out for. This information is not intended to replace advice given to you by your health care provider. Make sure you discuss any questions you have with your health care provider. Document Released: 11/11/2004 Document Revised: 09/22/2016 Document Reviewed: 09/22/2016 Elsevier Interactive Patient Education  2018 [employer].   IF you received an x-ray today, you will receive an invoice from [facility]. Please contact [facility] at [phone] with questions or concerns regarding your invoice.   IF you received labwork today, you will receive an invoice from [place]. Please contact [facility] at [phone] with questions or concerns regarding your invoice.   Our billing staff will not be able to assist you with questions regarding bills from these companies.  You will be contacted with the lab results as soon as they are available. The fastest way to get your results is to activate your My Chart account. Instructions are located on the last page of this paperwork. If you have not heard from [place] regarding the results in 2 weeks, please contact this office.       I personally performed the services described in this documentation, which was scribed in my presence. The recorded information has been reviewed and considered for accuracy and completeness, addended by me as needed, and agree with information above.  Signed,   [name], MD Primary [facility] at [place].  02/07/18 9:59 AM

## 2018-02-10 ENCOUNTER — Ambulatory Visit (HOSPITAL_COMMUNITY): Payer: Medicare Other | Attending: Cardiovascular Disease

## 2018-02-10 ENCOUNTER — Other Ambulatory Visit: Payer: Self-pay

## 2018-02-10 DIAGNOSIS — E119 Type 2 diabetes mellitus without complications: Secondary | ICD-10-CM | POA: Diagnosis not present

## 2018-02-10 DIAGNOSIS — R011 Cardiac murmur, unspecified: Secondary | ICD-10-CM | POA: Insufficient documentation

## 2018-02-10 DIAGNOSIS — I119 Hypertensive heart disease without heart failure: Secondary | ICD-10-CM | POA: Diagnosis not present

## 2018-02-10 DIAGNOSIS — I350 Nonrheumatic aortic (valve) stenosis: Secondary | ICD-10-CM | POA: Insufficient documentation

## 2018-02-15 DIAGNOSIS — E113293 Type 2 diabetes mellitus with mild nonproliferative diabetic retinopathy without macular edema, bilateral: Secondary | ICD-10-CM | POA: Diagnosis not present

## 2018-02-15 DIAGNOSIS — H35033 Hypertensive retinopathy, bilateral: Secondary | ICD-10-CM | POA: Diagnosis not present

## 2018-02-15 DIAGNOSIS — H402222 Chronic angle-closure glaucoma, left eye, moderate stage: Secondary | ICD-10-CM | POA: Diagnosis not present

## 2018-02-15 DIAGNOSIS — H402211 Chronic angle-closure glaucoma, right eye, mild stage: Secondary | ICD-10-CM | POA: Diagnosis not present

## 2018-03-15 DIAGNOSIS — N400 Enlarged prostate without lower urinary tract symptoms: Secondary | ICD-10-CM | POA: Diagnosis not present

## 2018-03-15 DIAGNOSIS — N5201 Erectile dysfunction due to arterial insufficiency: Secondary | ICD-10-CM | POA: Diagnosis not present

## 2018-03-15 DIAGNOSIS — R972 Elevated prostate specific antigen [PSA]: Secondary | ICD-10-CM | POA: Diagnosis not present

## 2018-04-25 DIAGNOSIS — M545 Low back pain: Secondary | ICD-10-CM | POA: Diagnosis not present

## 2018-04-28 DIAGNOSIS — M545 Low back pain: Secondary | ICD-10-CM | POA: Diagnosis not present

## 2018-05-08 DIAGNOSIS — M47816 Spondylosis without myelopathy or radiculopathy, lumbar region: Secondary | ICD-10-CM | POA: Diagnosis not present

## 2018-05-11 ENCOUNTER — Ambulatory Visit: Payer: Medicare Other | Admitting: Family Medicine

## 2018-05-16 ENCOUNTER — Other Ambulatory Visit: Payer: Self-pay

## 2018-05-16 ENCOUNTER — Encounter: Payer: Self-pay | Admitting: Family Medicine

## 2018-05-16 ENCOUNTER — Ambulatory Visit (INDEPENDENT_AMBULATORY_CARE_PROVIDER_SITE_OTHER): Payer: Medicare Other | Admitting: Family Medicine

## 2018-05-16 VITALS — BP 132/62 | HR 69 | Temp 98.4°F | Ht 65.0 in | Wt 176.2 lb

## 2018-05-16 DIAGNOSIS — E119 Type 2 diabetes mellitus without complications: Secondary | ICD-10-CM | POA: Diagnosis not present

## 2018-05-16 DIAGNOSIS — Z87898 Personal history of other specified conditions: Secondary | ICD-10-CM | POA: Diagnosis not present

## 2018-05-16 DIAGNOSIS — E785 Hyperlipidemia, unspecified: Secondary | ICD-10-CM

## 2018-05-16 DIAGNOSIS — I10 Essential (primary) hypertension: Secondary | ICD-10-CM | POA: Diagnosis not present

## 2018-05-16 DIAGNOSIS — R9431 Abnormal electrocardiogram [ECG] [EKG]: Secondary | ICD-10-CM

## 2018-05-16 LAB — LIPID PANEL
CHOLESTEROL TOTAL: 177 mg/dL (ref 100–199)
Chol/HDL Ratio: 2.8 ratio (ref 0.0–5.0)
HDL: 63 mg/dL (ref >39)
LDL Calculated: 104 mg/dL — ABNORMAL HIGH (ref 0–99)
TRIGLYCERIDES: 51 mg/dL (ref 0–149)
VLDL Cholesterol Cal: 10 mg/dL (ref 5–40)

## 2018-05-16 LAB — COMPREHENSIVE METABOLIC PANEL
ALBUMIN: 4.2 g/dL (ref 3.5–4.8)
ALK PHOS: 81 IU/L (ref 39–117)
ALT: 7 IU/L (ref 0–44)
AST: 15 IU/L (ref 0–40)
Albumin/Globulin Ratio: 1.6 (ref 1.2–2.2)
BILIRUBIN TOTAL: 0.4 mg/dL (ref 0.0–1.2)
BUN / CREAT RATIO: 12 (ref 10–24)
BUN: 14 mg/dL (ref 8–27)
CO2: 22 mmol/L (ref 20–29)
Calcium: 9.4 mg/dL (ref 8.6–10.2)
Chloride: 107 mmol/L — ABNORMAL HIGH (ref 96–106)
Creatinine, Ser: 1.18 mg/dL (ref 0.76–1.27)
GFR calc Af Amer: 69 mL/min/{1.73_m2} (ref >59)
GFR calc non Af Amer: 60 mL/min/{1.73_m2} (ref >59)
Globulin, Total: 2.6 g/dL (ref 1.5–4.5)
Glucose: 134 mg/dL — ABNORMAL HIGH (ref 65–99)
Potassium: 4.4 mmol/L (ref 3.5–5.2)
Sodium: 146 mmol/L — ABNORMAL HIGH (ref 134–144)
Total Protein: 6.8 g/dL (ref 6.0–8.5)

## 2018-05-16 LAB — HEMOGLOBIN A1C
ESTIMATED AVERAGE GLUCOSE: 160 mg/dL
Hgb A1c MFr Bld: 7.2 % — ABNORMAL HIGH (ref 4.8–5.6)

## 2018-05-16 MED ORDER — GLIMEPIRIDE 2 MG PO TABS: 2.0000 mg | ORAL_TABLET | Freq: Two times a day (BID) | ORAL | 1 refills | Status: DC

## 2018-05-16 MED ORDER — ATORVASTATIN CALCIUM 20 MG PO TABS: 20.0000 mg | ORAL_TABLET | Freq: Every day | ORAL | 1 refills | Status: DC

## 2018-05-16 MED ORDER — AMLODIPINE BESYLATE 5 MG PO TABS: 5.0000 mg | ORAL_TABLET | Freq: Every day | ORAL | 1 refills | Status: DC

## 2018-05-16 MED ORDER — QUINAPRIL HCL 20 MG PO TABS: 20.0000 mg | ORAL_TABLET | Freq: Every day | ORAL | 1 refills | Status: DC

## 2018-05-16 NOTE — Patient Instructions (Addendum)
No change in medications for now.  I will check your labs as discussed. I referred you to podiatry.  You should receive a call within the next 2 weeks, if not, let me know.  Chest pain a few weeks ago overall sounds reassuring , but unfortunately I do see some possible changes on your EKG compared to previous reading.  I will refer you to cardiology, and should have you seen within the next week if possible.  If you have any return of chest pain, proceed to the emergency room or call 911.  Avoid exertion, low intensity exercise such as walking only for now.   Return to the clinic or go to the nearest emergency room if any of your symptoms worsen or new symptoms occur.  Recheck 3 months for diabetes.  Thank you for coming in today.   Nonspecific Chest Pain Chest pain can be caused by many different conditions. There is always a chance that your pain could be related to something serious, such as a heart attack or a blood clot in your lungs. Chest pain can also be caused by conditions that are not life-threatening. If you have chest pain, it is very important to follow up with your health care provider. What are the causes? Causes of this condition include:  Heartburn.  Pneumonia or bronchitis.  Anxiety or stress.  Inflammation around your heart (pericarditis) or lung (pleuritis or pleurisy).  A blood clot in your lung.  A collapsed lung (pneumothorax). This can develop suddenly on its own (spontaneous pneumothorax) or from trauma to the chest.  Shingles infection (varicella-zoster virus).  Heart attack.  Damage to the bones, muscles, and cartilage that make up your chest wall. This can include: ? Bruised bones due to injury. ? Strained muscles or cartilage due to frequent or repeated coughing or overwork. ? Fracture to one or more ribs. ? Sore cartilage due to inflammation (costochondritis).  What increases the risk? Risk factors for this condition may include:  Activities that  increase your risk for trauma or injury to your chest.  Respiratory infections or conditions that cause frequent coughing.  Medical conditions or overeating that can cause heartburn.  Heart disease or family history of heart disease.  Conditions or health behaviors that increase your risk of developing a blood clot.  Having had chicken pox (varicella zoster).  What are the signs or symptoms? Chest pain can feel like:  Burning or tingling on the surface of your chest or deep in your chest.  Crushing, pressure, aching, or squeezing pain.  Dull or sharp pain that is worse when you move, cough, or take a deep breath.  Pain that is also felt in your back, neck, shoulder, or arm, or pain that spreads to any of these areas.  Your chest pain may come and go, or it may stay constant. How is this diagnosed? Lab tests or other studies may be needed to find the cause of your pain. Your health care provider may have you take a test called an ECG (electrocardiogram). An ECG records your heartbeat patterns at the time the test is performed. You may also have other tests, such as:  Transthoracic echocardiogram (TTE). In this test, sound waves are used to create a picture of the heart structures and to look at how blood flows through your heart.  Transesophageal echocardiogram (TEE).This is a more advanced imaging test that takes images from inside your body. It allows your health care provider to see your heart in finer  detail.  Cardiac monitoring. This allows your health care provider to monitor your heart rate and rhythm in real time.  Holter monitor. This is a portable device that records your heartbeat and can help to diagnose abnormal heartbeats. It allows your health care provider to track your heart activity for several days, if needed.  Stress tests. These can be done through exercise or by taking medicine that makes your heart beat more quickly.  Blood tests.  Other imaging  tests.  How is this treated? Treatment depends on what is causing your chest pain. Treatment may include:  Medicines. These may include: ? Acid blockers for heartburn. ? Anti-inflammatory medicine. ? Pain medicine for inflammatory conditions. ? Antibiotic medicine, if an infection is present. ? Medicines to dissolve blood clots. ? Medicines to treat coronary artery disease (CAD).  Supportive care for conditions that do not require medicines. This may include: ? Resting. ? Applying heat or cold packs to injured areas. ? Limiting activities until pain decreases.  Follow these instructions at home: Medicines  If you were prescribed an antibiotic, take it as told by your health care provider. Do not stop taking the antibiotic even if you start to feel better.  Take over-the-counter and prescription medicines only as told by your health care provider. Lifestyle  Do not use any products that contain nicotine or tobacco, such as cigarettes and e-cigarettes. If you need help quitting, ask your health care provider.  Do not drink alcohol.  Make lifestyle changes as directed by your health care provider. These may include: ? Getting regular exercise. Ask your health care provider to suggest some activities that are safe for you. ? Eating a heart-healthy diet. A registered dietitian can help you to learn healthy eating options. ? Maintaining a healthy weight. ? Managing diabetes, if necessary. ? Reducing stress, such as with yoga or relaxation techniques. General instructions  Avoid any activities that bring on chest pain.  If heartburn is the cause for your chest pain, raise (elevate) the head of your bed about 6 inches (15 cm) by putting blocks under the legs. Sleeping with more pillows does not effectively relieve heartburn because it only changes the position of your head.  Keep all follow-up visits as told by your health care provider. This is important. This includes any further  testing if your chest pain does not go away. Contact a health care provider if:  Your chest pain does not go away.  You have a rash with blisters on your chest.  You have a fever.  You have chills. Get help right away if:  Your chest pain is worse.  You have a cough that gets worse, or you cough up blood.  You have severe pain in your abdomen.  You have severe weakness.  You faint.  You have sudden, unexplained chest discomfort.  You have sudden, unexplained discomfort in your arms, back, neck, or jaw.  You have shortness of breath at any time.  You suddenly start to sweat, or your skin gets clammy.  You feel nauseous or you vomit.  You suddenly feel light-headed or dizzy.  Your heart begins to beat quickly, or it feels like it is skipping beats. These symptoms may represent a serious problem that is an emergency. Do not wait to see if the symptoms will go away. Get medical help right away. Call your local emergency services (911 in the [place] Do not drive yourself to the hospital. This information is not intended to replace advice  given to you by your health care provider. Make sure you discuss any questions you have with your health care provider. Document Released: 07/14/2005 Document Revised: 06/28/2016 Document Reviewed: 06/28/2016 Elsevier Interactive Patient Education  2017 [employer].     IF you received an x-ray today, you will receive an invoice from [facility]. Please contact [facility] at [phone] with questions or concerns regarding your invoice.   IF you received labwork today, you will receive an invoice from [facility]. Please contact [facility] at [phone] with questions or concerns regarding your invoice.   Our billing staff will not be able to assist you with questions regarding bills from these companies.  You will be contacted with the lab results as soon as they are available. The fastest way to get your results is to  activate your My Chart account. Instructions are located on the last page of this paperwork. If you have not heard from [place] regarding the results in 2 weeks, please contact this office.

## 2018-05-16 NOTE — Progress Notes (Signed)
Subjective:  By signing my name below, I, Delton Coombes, attest that this documentation has been prepared under the direction and in the presence of [name], MD Electronically Signed: Delton Coombes Medical Scribe 05/16/2018 at 9:00 AM   Patient ID: [name], male    DOB: Jul 21, 1941, 77 y.o.   MRN: [id number] Chief Complaint  Patient presents with  . chronic Condition    3 m f/u     HPI Mosiah Grime is a 77 y.o. male who presents to Primary Care at [facility] for a follow up of diabetes type 2 with retinopathy. He is followed by optho, and wears diabetic shoes. Normal urine microalbuminuria in January. He is on Statin and ACE inhibitor. Foot exam done today. We continued his same medication regime from last visit. Patient is fasting today. Patient denies having low blood sugars, reports checking his blood sugar and reporting numbers that are usually around 110's to 120's. Patient expressed concerns with new high prices for shoe fitting, and wanted some information for a new foot specialist for diabetic shoes.  Patient reports taking blood pressure medication regualry and denies having any issues or concerns with medication. Patient reports exercising three to five days per week without difficulty.   Diabetes   Lab Results  Component Value Date   HGBA1C 7.2 (H) 02/07/2018    Amaryl 29m twice per day, no symptomatic hypoglycemia   Foot Exam  Diabetic Foot Exam - Simple   Simple Foot Form Diabetic Foot exam was performed with the following findings:  Yes 05/16/2018  8:20 AM  Visual Inspection No deformities, no ulcerations, no other skin breakdown bilaterally:  Yes Sensation Testing Intact to touch and monofilament testing bilaterally:  Yes Pulse Check Posterior Tibialis and Dorsalis pulse intact bilaterally:  Yes Comments     Hyperlipemia Lab Results  Component Value Date   ALT 18 11/08/2017   AST 22 11/08/2017   ALKPHOS 86 11/08/2017   BILITOT 0.4 11/08/2017    Lab Results  Component Value Date   CHOL 165 11/08/2017   HDL 70 11/08/2017   LDLCALC 81 11/08/2017   TRIG 72 11/08/2017   CHOLHDL 2.4 11/08/2017   He is on atorvastatin 20 mg QD, no new myalgias.   Hypertension BP Readings from Last 3 Encounters:  05/16/18 132/62  02/07/18 128/82  11/08/17 (!) 146/78   Lab Results  Component Value Date   CREATININE 1.22 11/08/2017   He is Norvasc, Accupril 20 mg and Asprin 81 mg QD.   Patient Active Problem List   Diagnosis Date Noted  . Cough 10/21/2017  . Acute upper respiratory infection 10/21/2017  . [place] 03/31/2016  . Onychomycosis 02/25/2016  . UTI (lower urinary tract infection) 11/17/2014  . DM (diabetes mellitus), type 2 ([place] 11/17/2014  . HTN (hypertension) 11/17/2014  . [place]is [place] 11/17/2014   Past Medical History:  Diagnosis Date  . Diabetes mellitus   . Hypercholesterolemia   . Hypertension   . Kidney stones    Past Surgical History:  Procedure Laterality Date  . CATARACT EXTRACTION    . EYE SURGERY    . PROSTATE BIOPSY    . ROTATOR CUFF REPAIR     Right   No Known Allergies Prior to Admission medications   Medication Sig Start Date End Date Taking? Authorizing Provider  amLODipine (NORVASC) 5 MG tablet Take 1 tablet (5 mg total) by mouth daily. 11/08/17   GWendie Agreste MD  Ascorbic Acid (VITAMIN C) 1000 MG  tablet Take 1,000 mg by mouth daily.    [provider]  aspirin 81 MG chewable tablet Chew by mouth daily.    [provider]  atorvastatin (LIPITOR) 20 MG tablet Take 1 tablet (20 mg total) by mouth daily. 11/08/17   [name], MD  benzonatate (TESSALON) 200 MG capsule Take 1 capsule (200 mg total) by mouth 2 (two) times daily as needed for cough. 10/21/17   Horald Pollen, MD  blood glucose meter kit and supplies KIT 1 touch ultra meter.   Check blood sugar once daily E11.9 07/14/17   [name], MD  Blood Glucose Monitoring Suppl (ONE  TOUCH ULTRA SYSTEM KIT) w/Device KIT Test blood sugar once daily. Dx: E11.9 07/01/16   [name], MD  glimepiride (AMARYL) 2 MG tablet Take 1 tablet (2 mg total) by mouth 2 (two) times daily. 11/08/17   [name], MD  glucose blood test strip Test blood sugar once daily. Dx: E11.9 07/04/17   [name], MD  Lancets [facility] ULTRASOFT) lancets Test blood sugar once daily. Dx: E11.9 07/04/17   [name], MD  latanoprost (XALATAN) 0.005 % ophthalmic solution Place 1 drop into both eyes at bedtime.    [provider]  quinapril (ACCUPRIL) 20 MG tablet Take 1 tablet (20 mg total) by mouth daily. 11/08/17   [name], MD   Social History   Socioeconomic History  . Marital status: Divorced    Spouse name: Not on file  . Number of children: Not on file  . Years of education: Not on file  . Highest education level: Not on file  Occupational History  . Not on file  Social Needs  . Financial resource strain: Not on file  . Food insecurity:    Worry: Not on file    Inability: Not on file  . Transportation needs:    Medical: Not on file    Non-medical: Not on file  Tobacco Use  . Smoking status: Former Smoker    Years: 2.00  . Smokeless tobacco: Never Used  Substance and Sexual Activity  . Alcohol use: No    Alcohol/week: 0.0 oz  . Drug use: No  . Sexual activity: Not on file  Lifestyle  . Physical activity:    Days per week: Not on file    Minutes per session: Not on file  . Stress: Not on file  Relationships  . Social connections:    Talks on phone: Not on file    Gets together: Not on file    Attends religious service: Not on file    Active member of club or organization: Not on file    Attends meetings of clubs or organizations: Not on file    Relationship status: Not on file  . Intimate partner violence:    Fear of current or ex partner: Not on file    Emotionally abused: Not on file    Physically abused: Not on file    Forced  sexual activity: Not on file  Other Topics Concern  . Not on file  Social History Narrative  . Not on file     Review of Systems  Constitutional: Negative for fatigue and unexpected weight change.  Eyes: Negative for visual disturbance.  Respiratory: Negative for cough, chest tightness and shortness of breath.   Cardiovascular: Negative for chest pain, palpitations and leg swelling.  Gastrointestinal: Negative for abdominal pain and blood in stool.  Neurological: Negative  for dizziness, light-headedness and headaches.       Objective:   Physical Exam  Constitutional: He is oriented to person, place, and time. He appears well-developed and well-nourished.  HENT:  Head: Normocephalic and atraumatic.  Right Ear: Tympanic membrane, external ear and ear canal normal.  Left Ear: Tympanic membrane, external ear and ear canal normal.  Nose: No rhinorrhea.  Mouth/Throat: Oropharynx is clear and moist and mucous membranes are normal. No oropharyngeal exudate or posterior oropharyngeal erythema.  Eyes: Pupils are equal, round, and reactive to light. Conjunctivae and EOM are normal.  Neck: Neck supple. No JVD present. Carotid bruit is not present.  Cardiovascular: Normal rate, regular rhythm, normal heart sounds and intact distal pulses.  No murmur heard. Pulmonary/Chest: Effort normal and breath sounds normal. He has no wheezes. He has no rhonchi. He has no rales.  Abdominal: Soft. There is no tenderness.  Musculoskeletal: He exhibits no edema.  Lymphadenopathy:    He has no cervical adenopathy.  Neurological: He is alert and oriented to person, place, and time.  Skin: Skin is warm and dry. No rash noted.  Psychiatric: He has a normal mood and affect. His behavior is normal.  Vitals reviewed.  Vitals:   05/16/18 0818 05/16/18 0823  BP: (!) 152/76 132/62  Pulse: 69   Temp: 98.4 F (36.9 C)   TempSrc: Oral   SpO2: 92%   Weight: 176 lb 3.2 oz (79.9 kg)   Height: '5\' 5"'  (1.651 m)      EKG Sinus rhythm , rate 52, T-wave inversion V4 through V6, not seen on September 15, 2011 EKG, no other apparent acute findings.    Assessment & Plan:    Prophet Renwick is a 77 y.o. male Controlled type 2 diabetes mellitus without complication, without long-term current use of insulin ([place]) - Plan: Hemoglobin A1c, glimepiride (AMARYL) 2 MG tablet, Ambulatory referral to Podiatry, Ambulatory referral to Cardiology  -Tolerating current regimen, continue glimepiride same dose.  Check A1c.  Referred to podiatry for ongoing foot care and to evaluate for diabetic shoes/inserts.  Recheck 3 months  Essential hypertension - Plan: quinapril (ACCUPRIL) 20 MG tablet, amLODipine (NORVASC) 5 MG tablet, Ambulatory referral to Cardiology  -Stable readings, continue same dose of quinapril and amlodipine.  Hyperlipidemia, unspecified hyperlipidemia type - Plan: Comprehensive metabolic panel, Lipid panel, atorvastatin (LIPITOR) 20 MG tablet  -Tolerating Lipitor at current dose, check labs, continue same dose.  History of chest pain - Plan: EKG 12-Lead, Ambulatory referral to Cardiology Nonspecific abnormal electrocardiogram (ECG) (EKG) - Plan: Ambulatory referral to Cardiology  -As above had fleeting symptoms of chest pain, atypical, now asymptomatic.  EKG with some possible changes in T waves laterally.  Will refer to cardiology to determine further work-up given history of diabetes, hypertension hyperlipidemia.  If any return of chest pain, advised 911/ER evaluation immediately.  Avoid exertional exercise at this time until cleared to do so by cardiology.  Meds ordered this encounter  Medications  . quinapril (ACCUPRIL) 20 MG tablet    Sig: Take 1 tablet (20 mg total) by mouth daily.    Dispense:  90 tablet    Refill:  1  . glimepiride (AMARYL) 2 MG tablet    Sig: Take 1 tablet (2 mg total) by mouth 2 (two) times daily.    Dispense:  180 tablet    Refill:  1  . atorvastatin (LIPITOR) 20 MG tablet     Sig: Take 1 tablet (20 mg total) by mouth daily.  Dispense:  90 tablet    Refill:  1  . amLODipine (NORVASC) 5 MG tablet    Sig: Take 1 tablet (5 mg total) by mouth daily.    Dispense:  90 tablet    Refill:  1   Patient Instructions   No change in medications for now.  I will check your labs as discussed. I referred you to podiatry.  You should receive a call within the next 2 weeks, if not, let me know.  Chest pain a few weeks ago overall sounds reassuring , but unfortunately I do see some possible changes on your EKG compared to previous reading.  I will refer you to cardiology, and should have you seen within the next week if possible.  If you have any return of chest pain, proceed to the emergency room or call 911.  Avoid exertion, low intensity exercise such as walking only for now.   Return to the clinic or go to the nearest emergency room if any of your symptoms worsen or new symptoms occur.  Recheck 3 months for diabetes.  Thank you for coming in today.   Nonspecific Chest Pain Chest pain can be caused by many different conditions. There is always a chance that your pain could be related to something serious, such as a heart attack or a blood clot in your lungs. Chest pain can also be caused by conditions that are not life-threatening. If you have chest pain, it is very important to follow up with your health care provider. What are the causes? Causes of this condition include:  Heartburn.  Pneumonia or bronchitis.  Anxiety or stress.  Inflammation around your heart (pericarditis) or lung (pleuritis or pleurisy).  A blood clot in your lung.  A collapsed lung (pneumothorax). This can develop suddenly on its own (spontaneous pneumothorax) or from trauma to the chest.  Shingles infection (varicella-zoster virus).  Heart attack.  Damage to the bones, muscles, and cartilage that make up your chest wall. This can include: ? Bruised bones due to injury. ? Strained  muscles or cartilage due to frequent or repeated coughing or overwork. ? Fracture to one or more ribs. ? Sore cartilage due to inflammation (costochondritis).  What increases the risk? Risk factors for this condition may include:  Activities that increase your risk for trauma or injury to your chest.  Respiratory infections or conditions that cause frequent coughing.  Medical conditions or overeating that can cause heartburn.  Heart disease or family history of heart disease.  Conditions or health behaviors that increase your risk of developing a blood clot.  Having had chicken pox (varicella zoster).  What are the signs or symptoms? Chest pain can feel like:  Burning or tingling on the surface of your chest or deep in your chest.  Crushing, pressure, aching, or squeezing pain.  Dull or sharp pain that is worse when you move, cough, or take a deep breath.  Pain that is also felt in your back, neck, shoulder, or arm, or pain that spreads to any of these areas.  Your chest pain may come and go, or it may stay constant. How is this diagnosed? Lab tests or other studies may be needed to find the cause of your pain. Your health care provider may have you take a test called an ECG (electrocardiogram). An ECG records your heartbeat patterns at the time the test is performed. You may also have other tests, such as:  Transthoracic echocardiogram (TTE). In this test, sound waves  are used to create a picture of the heart structures and to look at how blood flows through your heart.  Transesophageal echocardiogram (TEE).This is a more advanced imaging test that takes images from inside your body. It allows your health care provider to see your heart in finer detail.  Cardiac monitoring. This allows your health care provider to monitor your heart rate and rhythm in real time.  Holter monitor. This is a portable device that records your heartbeat and can help to diagnose abnormal  heartbeats. It allows your health care provider to track your heart activity for several days, if needed.  Stress tests. These can be done through exercise or by taking medicine that makes your heart beat more quickly.  Blood tests.  Other imaging tests.  How is this treated? Treatment depends on what is causing your chest pain. Treatment may include:  Medicines. These may include: ? Acid blockers for heartburn. ? Anti-inflammatory medicine. ? Pain medicine for inflammatory conditions. ? Antibiotic medicine, if an infection is present. ? Medicines to dissolve blood clots. ? Medicines to treat coronary artery disease (CAD).  Supportive care for conditions that do not require medicines. This may include: ? Resting. ? Applying heat or cold packs to injured areas. ? Limiting activities until pain decreases.  Follow these instructions at home: Medicines  If you were prescribed an antibiotic, take it as told by your health care provider. Do not stop taking the antibiotic even if you start to feel better.  Take over-the-counter and prescription medicines only as told by your health care provider. Lifestyle  Do not use any products that contain nicotine or tobacco, such as cigarettes and e-cigarettes. If you need help quitting, ask your health care provider.  Do not drink alcohol.  Make lifestyle changes as directed by your health care provider. These may include: ? Getting regular exercise. Ask your health care provider to suggest some activities that are safe for you. ? Eating a heart-healthy diet. A registered dietitian can help you to learn healthy eating options. ? Maintaining a healthy weight. ? Managing diabetes, if necessary. ? Reducing stress, such as with yoga or relaxation techniques. General instructions  Avoid any activities that bring on chest pain.  If heartburn is the cause for your chest pain, raise (elevate) the head of your bed about 6 inches (15 cm) by  putting blocks under the legs. Sleeping with more pillows does not effectively relieve heartburn because it only changes the position of your head.  Keep all follow-up visits as told by your health care provider. This is important. This includes any further testing if your chest pain does not go away. Contact a health care provider if:  Your chest pain does not go away.  You have a rash with blisters on your chest.  You have a fever.  You have chills. Get help right away if:  Your chest pain is worse.  You have a cough that gets worse, or you cough up blood.  You have severe pain in your abdomen.  You have severe weakness.  You faint.  You have sudden, unexplained chest discomfort.  You have sudden, unexplained discomfort in your arms, back, neck, or jaw.  You have shortness of breath at any time.  You suddenly start to sweat, or your skin gets clammy.  You feel nauseous or you vomit.  You suddenly feel light-headed or dizzy.  Your heart begins to beat quickly, or it feels like it is skipping beats. These symptoms  may represent a serious problem that is an emergency. Do not wait to see if the symptoms will go away. Get medical help right away. Call your local emergency services (911 in the [place] Do not drive yourself to the hospital. This information is not intended to replace advice given to you by your health care provider. Make sure you discuss any questions you have with your health care provider. Document Released: 07/14/2005 Document Revised: 06/28/2016 Document Reviewed: 06/28/2016 Elsevier Interactive Patient Education  2017 [employer].     IF you received an x-ray today, you will receive an invoice from [facility]. Please contact [facility] at [phone] with questions or concerns regarding your invoice.   IF you received labwork today, you will receive an invoice from [facility]. Please contact [facility] at [phone] with questions or  concerns regarding your invoice.   Our billing staff will not be able to assist you with questions regarding bills from these companies.  You will be contacted with the lab results as soon as they are available. The fastest way to get your results is to activate your My Chart account. Instructions are located on the last page of this paperwork. If you have not heard from [place] regarding the results in 2 weeks, please contact this office.       I personally performed the services described in this documentation, which was scribed in my presence. The recorded information has been reviewed and considered for accuracy and completeness, addended by me as needed, and agree with information above.  Signed,   [name], MD [facility] at [facility].  05/17/18 4:28 PM

## 2018-05-19 DIAGNOSIS — I10 Essential (primary) hypertension: Secondary | ICD-10-CM | POA: Diagnosis not present

## 2018-05-19 DIAGNOSIS — E119 Type 2 diabetes mellitus without complications: Secondary | ICD-10-CM | POA: Diagnosis not present

## 2018-05-19 DIAGNOSIS — R9431 Abnormal electrocardiogram [ECG] [EKG]: Secondary | ICD-10-CM | POA: Diagnosis not present

## 2018-05-19 DIAGNOSIS — R0789 Other chest pain: Secondary | ICD-10-CM | POA: Diagnosis not present

## 2018-05-25 DIAGNOSIS — M47816 Spondylosis without myelopathy or radiculopathy, lumbar region: Secondary | ICD-10-CM | POA: Diagnosis not present

## 2018-05-29 DIAGNOSIS — E119 Type 2 diabetes mellitus without complications: Secondary | ICD-10-CM | POA: Diagnosis not present

## 2018-05-29 DIAGNOSIS — I10 Essential (primary) hypertension: Secondary | ICD-10-CM | POA: Diagnosis not present

## 2018-05-29 DIAGNOSIS — R0789 Other chest pain: Secondary | ICD-10-CM | POA: Diagnosis not present

## 2018-05-29 DIAGNOSIS — R9431 Abnormal electrocardiogram [ECG] [EKG]: Secondary | ICD-10-CM | POA: Diagnosis not present

## 2018-05-31 ENCOUNTER — Other Ambulatory Visit: Payer: Self-pay | Admitting: Family Medicine

## 2018-05-31 DIAGNOSIS — E870 Hyperosmolality and hypernatremia: Secondary | ICD-10-CM

## 2018-06-30 ENCOUNTER — Encounter: Payer: Self-pay | Admitting: Podiatry

## 2018-06-30 ENCOUNTER — Ambulatory Visit (INDEPENDENT_AMBULATORY_CARE_PROVIDER_SITE_OTHER): Payer: Medicare Other | Admitting: Podiatry

## 2018-06-30 DIAGNOSIS — M2011 Hallux valgus (acquired), right foot: Secondary | ICD-10-CM

## 2018-06-30 DIAGNOSIS — M2012 Hallux valgus (acquired), left foot: Secondary | ICD-10-CM | POA: Diagnosis not present

## 2018-06-30 DIAGNOSIS — M79674 Pain in right toe(s): Secondary | ICD-10-CM

## 2018-06-30 DIAGNOSIS — B351 Tinea unguium: Secondary | ICD-10-CM

## 2018-06-30 DIAGNOSIS — M79675 Pain in left toe(s): Secondary | ICD-10-CM

## 2018-06-30 DIAGNOSIS — E1142 Type 2 diabetes mellitus with diabetic polyneuropathy: Secondary | ICD-10-CM | POA: Diagnosis not present

## 2018-06-30 NOTE — Progress Notes (Signed)
Subjective: Tony Price  Is a 77 y.o. AAM who presents today with diabetes and cc of painful, discolored, thick toenails which interfere with daily activities and routine tasks.  Pain is aggravated when wearing enclosed shoe gear. He has had professional treatment in the past in [facility] and also [facility]. Pain is relieved with periodic professional debridement.  He also relates dropping a bowling ball on his right great toe 6-7 months ago, specifically his great toenail and notices some residual discoloration of his toenail from   He is requesting diabetic shoes on today.   Medical History   Date Unknown Diabetes mellitus  Date Unknown Hypercholesterolemia  Date Unknown Hypertension  Date Unknown Kidney stones   Problem List   Cardiovascular and Mediastinum  HTN (hypertension)   Respiratory  Acute upper respiratory infection   Endocrine  DM (diabetes mellitus), type 2 ([place])   Diabetic retinopathy ([place])   Musculoskeletal and Integument  Onychomycosis   Genitourinary  UTI (lower urinary tract infection)   Other  Sepsis ([place])   Cough    Surgical History   Date Unknown Cataract extraction  Date Unknown Eye surgery  Date Unknown Prostate biopsy  Date Unknown Rotator cuff repair    Medications    amLODipine (NORVASC) 5 MG tablet    Ascorbic Acid (VITAMIN C) 1000 MG tablet    aspirin 81 MG chewable tablet    atorvastatin (LIPITOR) 20 MG tablet    benzonatate (TESSALON) 200 MG capsule    blood glucose meter kit and supplies KIT    Blood Glucose Monitoring Suppl (ONE TOUCH ULTRA SYSTEM KIT) w/Device KIT    glimepiride (AMARYL) 2 MG tablet    glucose blood test strip    Lancets (ONETOUCH ULTRASOFT) lancets    latanoprost (XALATAN) 0.005 % ophthalmic solution    quinapril (ACCUPRIL) 20 MG tablet    Allergies      No Known Allergies   Tobacco History   Smoking Status  Former Smoker  Amount For 2 years      Smokeless Tobacco Status  Never Used   Family  History    Family history is unknown by patient.  Mother (Deceased)      Father (Deceased)   ROS: Per HPI unless specifically indicated in ROS section   Objective:  Vascular Examination: Capillary refill time immediate x 10 digits Dorsalis pedis and posterior tibial pulses present b/l No digital hair x 10 digits Skin temperature warm to warm b/l  Dermatological Examination: Skin with normal turgor, texture and tone b/l LE Toenails 1-5 b/l discolored, thick, dystrophic with subungual debris and pain with palpation to nailbeds due to thickness of nails. Right great toe nailplate with old subungual hematoma distal 1/2 of nailplate. No onycholysis, no subungual drainage, no surrounding erythema of nail. No tenderness to palpation of digit at IPJ or MPJ.  Musculoskeletal: Muscle strength 5/5 to all LE muscle groups Hallux valgus with bunion deformity b/l   Neurological: Sensation intact with 10 gram monofilament. 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 Toenails 1-5 b/l were debrided in length and girth without iatrogenic bleeding. 2. Patient to continue diabetic shoe gear daily. 3. Paperwork completed for 2019 shoe benefit. He will be contacted by our Pedorthist for measurements once certification paperwork is completed by PCP. 4. Patient to report any pedal injuries to medical professional immediately. 5. Follow up 3 months.  6. Patient/POA to call  should there be a concern in the interim.

## 2018-07-13 ENCOUNTER — Encounter (INDEPENDENT_AMBULATORY_CARE_PROVIDER_SITE_OTHER): Payer: Medicare Other | Admitting: Ophthalmology

## 2018-07-14 ENCOUNTER — Encounter (INDEPENDENT_AMBULATORY_CARE_PROVIDER_SITE_OTHER): Payer: Medicare Other | Admitting: Ophthalmology

## 2018-07-24 ENCOUNTER — Ambulatory Visit: Payer: Medicare Other | Admitting: Orthotics

## 2018-08-07 DIAGNOSIS — H169 Unspecified keratitis: Secondary | ICD-10-CM | POA: Diagnosis not present

## 2018-08-07 DIAGNOSIS — H04123 Dry eye syndrome of bilateral lacrimal glands: Secondary | ICD-10-CM | POA: Diagnosis not present

## 2018-08-07 DIAGNOSIS — H402231 Chronic angle-closure glaucoma, bilateral, mild stage: Secondary | ICD-10-CM | POA: Diagnosis not present

## 2018-08-07 DIAGNOSIS — H029 Unspecified disorder of eyelid: Secondary | ICD-10-CM | POA: Diagnosis not present

## 2018-08-11 ENCOUNTER — Encounter: Payer: Self-pay | Admitting: Family Medicine

## 2018-08-11 ENCOUNTER — Encounter (INDEPENDENT_AMBULATORY_CARE_PROVIDER_SITE_OTHER): Payer: Medicare Other | Admitting: Ophthalmology

## 2018-08-11 DIAGNOSIS — H43813 Vitreous degeneration, bilateral: Secondary | ICD-10-CM | POA: Diagnosis not present

## 2018-08-11 DIAGNOSIS — I10 Essential (primary) hypertension: Secondary | ICD-10-CM | POA: Diagnosis not present

## 2018-08-11 DIAGNOSIS — H35341 Macular cyst, hole, or pseudohole, right eye: Secondary | ICD-10-CM | POA: Diagnosis not present

## 2018-08-11 DIAGNOSIS — H35033 Hypertensive retinopathy, bilateral: Secondary | ICD-10-CM | POA: Diagnosis not present

## 2018-08-11 LAB — HM DIABETES EYE EXAM

## 2018-08-14 ENCOUNTER — Encounter: Payer: Self-pay | Admitting: Family Medicine

## 2018-08-14 ENCOUNTER — Other Ambulatory Visit: Payer: Self-pay

## 2018-08-14 ENCOUNTER — Ambulatory Visit (INDEPENDENT_AMBULATORY_CARE_PROVIDER_SITE_OTHER): Payer: Medicare Other | Admitting: Family Medicine

## 2018-08-14 VITALS — BP 160/74 | HR 64 | Temp 98.0°F | Ht 65.0 in | Wt 180.8 lb

## 2018-08-14 DIAGNOSIS — E870 Hyperosmolality and hypernatremia: Secondary | ICD-10-CM

## 2018-08-14 DIAGNOSIS — E785 Hyperlipidemia, unspecified: Secondary | ICD-10-CM

## 2018-08-14 DIAGNOSIS — E119 Type 2 diabetes mellitus without complications: Secondary | ICD-10-CM

## 2018-08-14 LAB — COMPREHENSIVE METABOLIC PANEL
A/G RATIO: 1.7 (ref 1.2–2.2)
ALT: 12 IU/L (ref 0–44)
AST: 18 IU/L (ref 0–40)
Albumin: 4.1 g/dL (ref 3.5–4.8)
Alkaline Phosphatase: 89 IU/L (ref 39–117)
BILIRUBIN TOTAL: 0.4 mg/dL (ref 0.0–1.2)
BUN/Creatinine Ratio: 10 (ref 10–24)
BUN: 11 mg/dL (ref 8–27)
CALCIUM: 9 mg/dL (ref 8.6–10.2)
CHLORIDE: 106 mmol/L (ref 96–106)
CO2: 25 mmol/L (ref 20–29)
Creatinine, Ser: 1.13 mg/dL (ref 0.76–1.27)
GFR, EST AFRICAN AMERICAN: 73 mL/min/{1.73_m2} (ref >59)
GFR, EST NON AFRICAN AMERICAN: 63 mL/min/{1.73_m2} (ref >59)
GLOBULIN, TOTAL: 2.4 g/dL (ref 1.5–4.5)
Glucose: 123 mg/dL — ABNORMAL HIGH (ref 65–99)
POTASSIUM: 4.4 mmol/L (ref 3.5–5.2)
SODIUM: 142 mmol/L (ref 134–144)
Total Protein: 6.5 g/dL (ref 6.0–8.5)

## 2018-08-14 LAB — HEMOGLOBIN A1C
Est. average glucose Bld gHb Est-mCnc: 160 mg/dL
Hgb A1c MFr Bld: 7.2 % — ABNORMAL HIGH (ref 4.8–5.6)

## 2018-08-14 LAB — LIPID PANEL
CHOL/HDL RATIO: 2.3 ratio (ref 0.0–5.0)
CHOLESTEROL TOTAL: 150 mg/dL (ref 100–199)
HDL: 65 mg/dL (ref >39)
LDL CALC: 76 mg/dL (ref 0–99)
Triglycerides: 44 mg/dL (ref 0–149)
VLDL Cholesterol Cal: 9 mg/dL (ref 5–40)

## 2018-08-14 NOTE — Progress Notes (Signed)
Subjective:  By signing my name below, I, Essence Howell, attest that this documentation has been prepared under the direction and in the presence of [name], MD Electronically Signed: Ladene Artist, ED Scribe 08/14/2018 at 8:51 AM.   Patient ID: Tony Price, male    DOB: July 15, 1941, 77 y.o.   MRN: [id number]  Chief Complaint  Patient presents with  . Diabetes    3 m f/u    HPI  Tony Price is a 77 y.o. male who presents to Primary Care at [facility] for f/u. Pt is fasting at this time.  DM Lab Results  Component Value Date   HGBA1C 7.2 (H) 05/16/2018  Amaryl 2 mg bid, tolerating well last OV. - Pt has been checking his blood glucose with readings 110-130. Denies hypoglycemic symptoms, new side-effects.  HTN Lab Results  Component Value Date   CREATININE 1.18 05/16/2018   BP Readings from Last 3 Encounters:  08/14/18 (!) 160/74  05/16/18 132/62  02/07/18 128/82  Stable on prev regimen. - Denies new side-effects.  Hyperlipidemia Lab Results  Component Value Date   CHOL 177 05/16/2018   HDL 63 05/16/2018   LDLCALC 104 (H) 05/16/2018   TRIG 51 05/16/2018   CHOLHDL 2.8 05/16/2018   Lab Results  Component Value Date   ALT 7 05/16/2018   AST 15 05/16/2018   ALKPHOS 81 05/16/2018   BILITOT 0.4 05/16/2018  Discussed increasing atorvastatin with h/o DM. Planned for recheck within a few months. - Denies myalgias, arthralgias, new side-effects.  H/o Cp See last OV. Referred to cardiology, seen 8/2. EKG thought to be related to HTN. Planned for stress testing. - Pt has not had a stress test done yet. Denies cp, sob, lightheadedness, dizziness, abdominal pain, blood in stools.  Hyernatremia Noted on 7/30 labs, borderline at 146.  Patient Active Problem List   Diagnosis Date Noted  . Cough 10/21/2017  . Acute upper respiratory infection 10/21/2017  . Diabetic retinopathy ([place]) 03/31/2016  . Onychomycosis 02/25/2016  . UTI (lower urinary tract infection)  11/17/2014  . DM (diabetes mellitus), type 2 ([place]) 11/17/2014  . HTN (hypertension) 11/17/2014  . Sepsis ([place]) 11/17/2014   Past Medical History:  Diagnosis Date  . Diabetes mellitus   . Hypercholesterolemia   . Hypertension   . Kidney stones    Past Surgical History:  Procedure Laterality Date  . CATARACT EXTRACTION    . EYE SURGERY    . PROSTATE BIOPSY    . ROTATOR CUFF REPAIR     Right   No Known Allergies Prior to Admission medications   Medication Sig Start Date End Date Taking? Authorizing Provider  amLODipine (NORVASC) 5 MG tablet Take 1 tablet (5 mg total) by mouth daily. 05/16/18  Yes [name], MD  Ascorbic Acid (VITAMIN C) 1000 MG tablet Take 1,000 mg by mouth daily.   Yes [provider]  aspirin 81 MG chewable tablet Chew by mouth daily.   Yes [provider]  atorvastatin (LIPITOR) 20 MG tablet Take 1 tablet (20 mg total) by mouth daily. 05/16/18  Yes [name], MD  blood glucose meter kit and supplies KIT 1 touch ultra meter.   Check blood sugar once daily E11.9 07/14/17  Yes [name], MD  Blood Glucose Monitoring Suppl (ONE TOUCH ULTRA SYSTEM KIT) w/Device KIT Test blood sugar once daily. Dx: E11.9 07/01/16  Yes [name], MD  glimepiride (AMARYL) 2 MG tablet Take 1 tablet (2 mg total)  by mouth 2 (two) times daily. 05/16/18  Yes [name], MD  glucose blood test strip Test blood sugar once daily. Dx: E11.9 07/04/17  Yes [name], MD  Lancets [facility] ULTRASOFT) lancets Test blood sugar once daily. Dx: E11.9 07/04/17  Yes [name], MD  latanoprost (XALATAN) 0.005 % ophthalmic solution Place 1 drop into both eyes at bedtime.   Yes [provider]  quinapril (ACCUPRIL) 20 MG tablet Take 1 tablet (20 mg total) by mouth daily. 05/16/18  Yes [name], MD   Social History   Socioeconomic History  . Marital status: Divorced    Spouse name: Not on file  . Number of children: Not on  file  . Years of education: Not on file  . Highest education level: Not on file  Occupational History  . Not on file  Social Needs  . Financial resource strain: Not on file  . Food insecurity:    Worry: Not on file    Inability: Not on file  . Transportation needs:    Medical: Not on file    Non-medical: Not on file  Tobacco Use  . Smoking status: Former Smoker    Years: 2.00  . Smokeless tobacco: Never Used  Substance and Sexual Activity  . Alcohol use: No    Alcohol/week: 0.0 standard drinks  . Drug use: No  . Sexual activity: Not on file  Lifestyle  . Physical activity:    Days per week: Not on file    Minutes per session: Not on file  . Stress: Not on file  Relationships  . Social connections:    Talks on phone: Not on file    Gets together: Not on file    Attends religious service: Not on file    Active member of club or organization: Not on file    Attends meetings of clubs or organizations: Not on file    Relationship status: Not on file  . Intimate partner violence:    Fear of current or ex partner: Not on file    Emotionally abused: Not on file    Physically abused: Not on file    Forced sexual activity: Not on file  Other Topics Concern  . Not on file  Social History Narrative  . Not on file   Review of Systems  Constitutional: Negative for fatigue and unexpected weight change.  Eyes: Negative for visual disturbance.  Respiratory: Negative for cough, chest tightness and shortness of breath.   Cardiovascular: Negative for chest pain, palpitations and leg swelling.  Gastrointestinal: Negative for abdominal pain and blood in stool.  Musculoskeletal: Negative for arthralgias and myalgias.  Neurological: Negative for dizziness, light-headedness and headaches.      Objective:   Physical Exam  Constitutional: He is oriented to person, place, and time. He appears well-developed and well-nourished.  HENT:  Head: Normocephalic and atraumatic.  Eyes: Pupils  are equal, round, and reactive to light. EOM are normal.  Neck: No JVD present. Carotid bruit is not present.  Cardiovascular: Normal rate, regular rhythm and normal heart sounds.  No murmur heard. Pulmonary/Chest: Effort normal and breath sounds normal. He has no rales.  Musculoskeletal: He exhibits no edema.  Neurological: He is alert and oriented to person, place, and time.  Skin: Skin is warm and dry.  Psychiatric: He has a normal mood and affect.  Vitals reviewed.   Vitals:   08/14/18 0825 08/14/18 0829  BP: (!) 164/72 (!) 160/74  Pulse:  64   Temp: 98 F (36.7 C)   TempSrc: Oral   SpO2: 98%   Weight: 180 lb 12.8 oz (82 kg)   Height: '5\' 5"'  (1.651 m)       Assessment & Plan:  Tony Price is a 77 y.o. male Controlled type 2 diabetes mellitus without complication, without long-term current use of insulin ([place]) - Plan: Hemoglobin A1c  -Borderline controlled previously, recheck A1c, continue Amaryl same dose for now.  -Phone number provided for cardiology as planned stress testing.  Hyperlipidemia, unspecified hyperlipidemia type - Plan: Lipid panel  -Ideally goal LDL less than 70, recheck lipid panel.  Same  dose Lipitor for now.  Hypernatremia - Plan: Comprehensive metabolic panel  -Borderline prior, recheck labs.  No orders of the defined types were placed in this encounter.  Patient Instructions    Please call [facility] Cardiovascular as I do not see that you have had the stress test. [phone].   I will check A1c and cholesterol testing today.   Thanks for coming in today.     If you have lab work done today you will be contacted with your lab results within the next 2 weeks.  If you have not heard from [place] then please contact us. The fastest way to get your results is to register for My Chart.   IF you received an x-ray today, you will receive an invoice from [facility] Radiology. Please contact [facility] Radiology at [phone] with questions or concerns  regarding your invoice.   IF you received labwork today, you will receive an invoice from [place]. Please contact [facility] at [phone] with questions or concerns regarding your invoice.   Our billing staff will not be able to assist you with questions regarding bills from these companies.  You will be contacted with the lab results as soon as they are available. The fastest way to get your results is to activate your My Chart account. Instructions are located on the last page of this paperwork. If you have not heard from [place] regarding the results in 2 weeks, please contact this office.       I personally performed the services described in this documentation, which was scribed in my presence. The recorded information has been reviewed and considered for accuracy and completeness, addended by me as needed, and agree with information above.  Signed,   [name], MD Primary Care at [place].  08/14/18 9:38 AM

## 2018-08-14 NOTE — Patient Instructions (Addendum)
  Please call [facility] Cardiovascular as I do not see that you have had the stress test. [phone].   I will check A1c and cholesterol testing today.   Thanks for coming in today.     If you have lab work done today you will be contacted with your lab results within the next 2 weeks.  If you have not heard from [place] then please contact us. The fastest way to get your results is to register for My Chart.   IF you received an x-ray today, you will receive an invoice from [facility] Radiology. Please contact [facility] Radiology at [phone] with questions or concerns regarding your invoice.   IF you received labwork today, you will receive an invoice from [place]. Please contact [facility] at [phone] with questions or concerns regarding your invoice.   Our billing staff will not be able to assist you with questions regarding bills from these companies.  You will be contacted with the lab results as soon as they are available. The fastest way to get your results is to activate your My Chart account. Instructions are located on the last page of this paperwork. If you have not heard from [place] regarding the results in 2 weeks, please contact this office.

## 2018-08-28 DIAGNOSIS — R972 Elevated prostate specific antigen [PSA]: Secondary | ICD-10-CM | POA: Diagnosis not present

## 2018-08-29 ENCOUNTER — Ambulatory Visit (INDEPENDENT_AMBULATORY_CARE_PROVIDER_SITE_OTHER): Payer: Medicare Other | Admitting: Orthotics

## 2018-08-29 DIAGNOSIS — M79671 Pain in right foot: Secondary | ICD-10-CM

## 2018-08-29 DIAGNOSIS — M79675 Pain in left toe(s): Principal | ICD-10-CM

## 2018-08-29 DIAGNOSIS — M79672 Pain in left foot: Secondary | ICD-10-CM

## 2018-08-29 DIAGNOSIS — M79674 Pain in right toe(s): Principal | ICD-10-CM

## 2018-08-29 DIAGNOSIS — B351 Tinea unguium: Secondary | ICD-10-CM

## 2018-08-29 DIAGNOSIS — M2011 Hallux valgus (acquired), right foot: Secondary | ICD-10-CM

## 2018-08-29 DIAGNOSIS — E1142 Type 2 diabetes mellitus with diabetic polyneuropathy: Secondary | ICD-10-CM

## 2018-08-29 DIAGNOSIS — M21969 Unspecified acquired deformity of unspecified lower leg: Secondary | ICD-10-CM

## 2018-08-29 DIAGNOSIS — E1141 Type 2 diabetes mellitus with diabetic mononeuropathy: Secondary | ICD-10-CM | POA: Diagnosis not present

## 2018-08-29 DIAGNOSIS — M2012 Hallux valgus (acquired), left foot: Secondary | ICD-10-CM

## 2018-09-06 DIAGNOSIS — N401 Enlarged prostate with lower urinary tract symptoms: Secondary | ICD-10-CM | POA: Diagnosis not present

## 2018-09-06 DIAGNOSIS — R972 Elevated prostate specific antigen [PSA]: Secondary | ICD-10-CM | POA: Diagnosis not present

## 2018-09-06 DIAGNOSIS — R351 Nocturia: Secondary | ICD-10-CM | POA: Diagnosis not present

## 2018-09-06 NOTE — Progress Notes (Signed)
Patient had appointment today for definitive and final diabetic shoe fitting and delivery.  Patient was seen by Betha, C.Ped, OHI.   The inserts fit well and accomplished full contact with the plantar surface of the foot bilateral; the shoes fit well and offered forefoot freedom, no noticible heel slippage, and good toe clearance w/ the insert in place.  Patient was advised to monitor of any skin irritation, breakdown.  Patient was satisfied with fit and function. 

## 2018-09-27 ENCOUNTER — Other Ambulatory Visit: Payer: Self-pay | Admitting: Family Medicine

## 2018-09-27 DIAGNOSIS — E785 Hyperlipidemia, unspecified: Secondary | ICD-10-CM

## 2018-09-28 NOTE — Telephone Encounter (Signed)
Requested Prescriptions  Pending Prescriptions Disp Refills  . atorvastatin (LIPITOR) 20 MG tablet [Pharmacy Med Name: ATORVASTATIN 20 MG TABLET] 90 tablet 1    Sig: TAKE 1 TABLET BY MOUTH EVERY DAY     Cardiovascular:  Antilipid - Statins Passed - 09/27/2018  2:30 AM      Passed - Total Cholesterol in normal range and within 360 days    Cholesterol, Total  Date Value Ref Range Status  08/14/2018 150 100 - 199 mg/dL Final         Passed - LDL in normal range and within 360 days    LDL Calculated  Date Value Ref Range Status  08/14/2018 76 0 - 99 mg/dL Final         Passed - HDL in normal range and within 360 days    HDL  Date Value Ref Range Status  08/14/2018 65 >39 mg/dL Final         Passed - Triglycerides in normal range and within 360 days    Triglycerides  Date Value Ref Range Status  08/14/2018 44 0 - 149 mg/dL Final         Passed - Patient is not pregnant      Passed - Valid encounter within last 12 months    Recent Outpatient Visits          1 month ago Controlled type 2 diabetes mellitus without complication, without long-term current use of insulin ([place])   [facility] at [facility], Ranell Patrick, MD   4 months ago Controlled type 2 diabetes mellitus without complication, without long-term current use of insulin [facility])   [facility] at [facility], Ranell Patrick, MD   7 months ago Type 2 diabetes mellitus with retinopathy, without long-term current use of insulin, macular edema presence unspecified, unspecified laterality, unspecified retinopathy severity ([place])   [facility] at [facility], Ranell Patrick, MD   10 months ago Type 2 diabetes mellitus with retinopathy, without long-term current use of insulin, macular edema presence unspecified, unspecified laterality, unspecified retinopathy severity ([place])   [facility] at [facility], Ranell Patrick, MD   11 months ago Cough   Primary Care at [facility], Ines Bloomer, MD      Future Appointments            In 1 month Carlota Raspberry Ranell Patrick, MD Primary Care at [place], [facility]

## 2018-11-14 ENCOUNTER — Ambulatory Visit (INDEPENDENT_AMBULATORY_CARE_PROVIDER_SITE_OTHER): Payer: Medicare Other | Admitting: Family Medicine

## 2018-11-14 ENCOUNTER — Other Ambulatory Visit: Payer: Self-pay

## 2018-11-14 ENCOUNTER — Encounter: Payer: Self-pay | Admitting: Family Medicine

## 2018-11-14 VITALS — BP 134/78 | HR 71 | Temp 98.4°F | Resp 16 | Ht 65.0 in | Wt 177.6 lb

## 2018-11-14 DIAGNOSIS — E11319 Type 2 diabetes mellitus with unspecified diabetic retinopathy without macular edema: Secondary | ICD-10-CM

## 2018-11-14 LAB — HEMOGLOBIN A1C
ESTIMATED AVERAGE GLUCOSE: 154 mg/dL
Hgb A1c MFr Bld: 7 % — ABNORMAL HIGH (ref 4.8–5.6)

## 2018-11-14 NOTE — Patient Instructions (Addendum)
  Thanks for coming in today.  Depending on A1c, can discuss med changes. Follow up in 3 months.    If you have lab work done today you will be contacted with your lab results within the next 2 weeks.  If you have not heard from [place] then please contact us. The fastest way to get your results is to register for My Chart.   IF you received an x-ray today, you will receive an invoice from [facility]. Please contact [facility] Radiology at [phone] with questions or concerns regarding your invoice.   IF you received labwork today, you will receive an invoice from [place]. Please contact [facility] at [phone] with questions or concerns regarding your invoice.   Our billing staff will not be able to assist you with questions regarding bills from these companies.  You will be contacted with the lab results as soon as they are available. The fastest way to get your results is to activate your My Chart account. Instructions are located on the last page of this paperwork. If you have not heard from [place] regarding the results in 2 weeks, please contact this office.

## 2018-11-14 NOTE — Progress Notes (Signed)
Subjective:    Patient ID: Tony Price, male    DOB: May 25, 1941, 78 y.o.   MRN: [id number]  HPI Tony Price is a 78 y.o. male Presents today for: Chief Complaint  Patient presents with  . Diabetes    follow up blood sugar this morning was 146 before breakfast   Diabetes:  Borderline control previously with A1c hovering around 7.2.  Remained on same medication with recommendations of diet/activity and exercise improvements for improved control.  He has lost 3 pounds since last visit. Still exercising 3-5 days per week.  Rare fast food. Rare sweet tea, rare soda.  Takes glimepiride 2 mg twice daily, on statin, on ACE inhibitor, on aspirin. Occasional left flank pain if lying on side only.  Similar sx's in past. No hematuria, no groin pain/n/v/stool changes.   Microalbumin: Normal urine microalbumin 1 year ago Optho, foot exam, pneumovax: Pneumovax postponed as out of supply currently.  Up-to-date on foot exam and ophthalmology exam.  Wt Readings from Last 3 Encounters:  11/14/18 177 lb 9.6 oz (80.6 kg)  08/14/18 180 lb 12.8 oz (82 kg)  05/16/18 176 lb 3.2 oz (79.9 kg)    Lab Results  Component Value Date   HGBA1C 7.2 (H) 08/14/2018   HGBA1C 7.2 (H) 05/16/2018   HGBA1C 7.2 (H) 02/07/2018   Lab Results  Component Value Date   MICROALBUR 1.1 08/04/2015   [place] 76 08/14/2018   CREATININE 1.13 08/14/2018     Patient Active Problem List   Diagnosis Date Noted  . Cough 10/21/2017  . Acute upper respiratory infection 10/21/2017  . Diabetic retinopathy ([place]) 03/31/2016  . Onychomycosis 02/25/2016  . UTI (lower urinary tract infection) 11/17/2014  . DM (diabetes mellitus), type 2 ([place]) 11/17/2014  . HTN (hypertension) 11/17/2014  . Sepsis ([place]) 11/17/2014   Past Medical History:  Diagnosis Date  . Diabetes mellitus   . Hypercholesterolemia   . Hypertension   . Kidney stones    Past Surgical History:  Procedure Laterality Date  . CATARACT EXTRACTION    .  EYE SURGERY    . PROSTATE BIOPSY    . ROTATOR CUFF REPAIR     Right   No Known Allergies Prior to Admission medications   Medication Sig Start Date End Date Taking? Authorizing Provider  amLODipine (NORVASC) 5 MG tablet Take 1 tablet (5 mg total) by mouth daily. 05/16/18  Yes [name], MD  Ascorbic Acid (VITAMIN C) 1000 MG tablet Take 1,000 mg by mouth daily.   Yes [provider]  aspirin 81 MG chewable tablet Chew by mouth daily.   Yes [provider]  atorvastatin (LIPITOR) 20 MG tablet TAKE 1 TABLET BY MOUTH EVERY DAY 09/28/18  Yes [name], MD  blood glucose meter kit and supplies KIT 1 touch ultra meter.   Check blood sugar once daily E11.9 07/14/17  Yes [name], MD  Blood Glucose Monitoring Suppl (ONE TOUCH ULTRA SYSTEM KIT) w/Device KIT Test blood sugar once daily. Dx: E11.9 07/01/16  Yes [name], MD  glimepiride (AMARYL) 2 MG tablet Take 1 tablet (2 mg total) by mouth 2 (two) times daily. 05/16/18  Yes [name], MD  glucose blood test strip Test blood sugar once daily. Dx: E11.9 07/04/17  Yes [name], MD  Lancets [facility] ULTRASOFT) lancets Test blood sugar once daily. Dx: E11.9 07/04/17  Yes [name], MD  latanoprost (XALATAN) 0.005 % ophthalmic solution Place 1 drop into both  eyes at bedtime.   Yes [provider]  quinapril (ACCUPRIL) 20 MG tablet Take 1 tablet (20 mg total) by mouth daily. 05/16/18  Yes [name], MD   Social History   Socioeconomic History  . Marital status: Divorced    Spouse name: Not on file  . Number of children: Not on file  . Years of education: Not on file  . Highest education level: Not on file  Occupational History  . Not on file  Social Needs  . Financial resource strain: Not on file  . Food insecurity:    Worry: Not on file    Inability: Not on file  . Transportation needs:    Medical: Not on file    Non-medical: Not on file  Tobacco Use  .  Smoking status: Former Smoker    Years: 2.00  . Smokeless tobacco: Never Used  Substance and Sexual Activity  . Alcohol use: No    Alcohol/week: 0.0 standard drinks  . Drug use: No  . Sexual activity: Not on file  Lifestyle  . Physical activity:    Days per week: Not on file    Minutes per session: Not on file  . Stress: Not on file  Relationships  . Social connections:    Talks on phone: Not on file    Gets together: Not on file    Attends religious service: Not on file    Active member of club or organization: Not on file    Attends meetings of clubs or organizations: Not on file    Relationship status: Not on file  . Intimate partner violence:    Fear of current or ex partner: Not on file    Emotionally abused: Not on file    Physically abused: Not on file    Forced sexual activity: Not on file  Other Topics Concern  . Not on file  Social History Narrative  . Not on file    Review of Systems  Constitutional: Negative for fatigue and unexpected weight change.  Eyes: Negative for visual disturbance.  Respiratory: Negative for cough, chest tightness and shortness of breath.   Cardiovascular: Negative for chest pain, palpitations and leg swelling.  Gastrointestinal: Negative for abdominal pain and blood in stool.  Neurological: Negative for dizziness, light-headedness and headaches.       Objective:   Physical Exam Vitals signs reviewed.  Constitutional:      Appearance: He is well-developed.  HENT:     Head: Normocephalic and atraumatic.  Eyes:     Pupils: Pupils are equal, round, and reactive to light.  Neck:     Vascular: No carotid bruit or JVD.  Cardiovascular:     Rate and Rhythm: Normal rate and regular rhythm.     Heart sounds: Normal heart sounds. No murmur.  Pulmonary:     Effort: Pulmonary effort is normal.     Breath sounds: Normal breath sounds. No rales.  Skin:    General: Skin is warm and dry.  Neurological:     Mental Status: He is alert  and oriented to person, place, and time.   back/abdomen/flank nontender.   Vitals:   11/14/18 1003 11/14/18 1005 11/14/18 1006  BP: (!) 161/73 (!) 154/72 134/78  Pulse: 71    Resp: 16    Temp: 98.4 F (36.9 C)    TempSrc: Oral    SpO2: 97%    Weight: 177 lb 9.6 oz (80.6 kg)    Height: '5\' 5"'  (1.651  m)         Assessment & Plan:  Tony Price is a 78 y.o. male Type 2 diabetes mellitus with retinopathy, without long-term current use of insulin, macular edema presence unspecified, unspecified laterality, unspecified retinopathy severity ([place]) - Plan: Hemoglobin A1c  -Borderline control with previous A1c in the low sevens.  Repeat testing today, no med changes at this time, but could consider different class of medicines.   -Intermittent flank pain, but asymptomatic on exam.  Denies other associated symptoms.  If more persistent or worsening would recommend recheck and possible imaging.  Reassuring exam at present. Recheck 3 months.   No orders of the defined types were placed in this encounter.  Patient Instructions       If you have lab work done today you will be contacted with your lab results within the next 2 weeks.  If you have not heard from [place] then please contact us. The fastest way to get your results is to register for My Chart.   IF you received an x-ray today, you will receive an invoice from [facility] Radiology. Please contact [facility] Radiology at [phone] with questions or concerns regarding your invoice.   IF you received labwork today, you will receive an invoice from [place]. Please contact [facility] at [phone] with questions or concerns regarding your invoice.   Our billing staff will not be able to assist you with questions regarding bills from these companies.  You will be contacted with the lab results as soon as they are available. The fastest way to get your results is to activate your My Chart account. Instructions are located on the last  page of this paperwork. If you have not heard from [place] regarding the results in 2 weeks, please contact this office.       Signed,   [name], MD Primary Care at [place].  11/14/18 10:39 AM

## 2018-11-15 LAB — MICROALBUMIN / CREATININE URINE RATIO
Creatinine, Urine: 221.4 mg/dL
Microalb/Creat Ratio: 28 mg/g creat (ref 0–29)
Microalbumin, Urine: 61.8 ug/mL

## 2018-11-24 NOTE — Progress Notes (Signed)
Patient is here for follow up visit.  Subjective:   _0  ID: Tony Price, male    DOB: November 09, 1940, 78 y.o.   MRN: [id number]  Chief Complaint  Patient presents with  . Hypertension    31mo  . Chest Pain  . Follow-up    HPI  Very occasional chest pain on the right side. Nothing regular.  No regular BOP check at home.   78y/o [place] male with hypertension, hyperlipidemia, type 2 diabetes, was last seen in 05/2018 for evaluation of chest pain.    Workup with stress test showed equivocal EKG changes without any chest pain. I had then recommended continued medical management and follow up. He is here for 6 month follow up visit.   He stays active, with only occasional chest pain/ He does not check NP at home regularly. BP is elevated today.    Past Medical History:  Diagnosis Date  . Chest pain   . Diabetes mellitus   . Hypercholesterolemia   . Hypertension   . Kidney stones     Past Surgical History:  Procedure Laterality Date  . CATARACT EXTRACTION    . EYE SURGERY    . PROSTATE BIOPSY    . ROTATOR CUFF REPAIR     Right    Social History   Socioeconomic History  . Marital status: Divorced    Spouse name: Not on file  . Number of children: 3  . Years of education: Not on file  . Highest education level: Not on file  Occupational History  . Not on file  Social Needs  . Financial resource strain: Not on file  . Food insecurity:    Worry: Not on file    Inability: Not on file  . Transportation needs:    Medical: Not on file    Non-medical: Not on file  Tobacco Use  . Smoking status: Former Smoker    Years: 2.00  . Smokeless tobacco: Never Used  Substance and Sexual Activity  . Alcohol use: No    Alcohol/week: 0.0 standard drinks  . Drug use: No  . Sexual activity: Not on file  Lifestyle  . Physical activity:    Days per week: Not on file    Minutes per session: Not on file  . Stress: Not on file  Relationships  . Social  connections:    Talks on phone: Not on file    Gets together: Not on file    Attends religious service: Not on file    Active member of club or organization: Not on file    Attends meetings of clubs or organizations: Not on file    Relationship status: Not on file  . Intimate partner violence:    Fear of current or ex partner: Not on file    Emotionally abused: Not on file    Physically abused: Not on file    Forced sexual activity: Not on file  Other Topics Concern  . Not on file  Social History Narrative  . Not on file    Current Outpatient Medications on File Prior to Visit  Medication Sig Dispense Refill  . Ascorbic Acid (VITAMIN C) 1000 MG tablet Take 1,000 mg by mouth daily.    .Marland Kitchenaspirin 81 MG chewable tablet Chew by mouth daily.    .Marland Kitchenatorvastatin (LIPITOR) 20 MG tablet TAKE 1 TABLET BY MOUTH EVERY DAY 90 tablet 1  . blood glucose meter kit and supplies KIT 1 touch ultra meter.  Check blood sugar once daily E11.9 1 each 0  . Blood Glucose Monitoring Suppl (ONE TOUCH ULTRA SYSTEM KIT) w/Device KIT Test blood sugar once daily. Dx: E11.9 1 each 0  . glimepiride (AMARYL) 2 MG tablet Take 1 tablet (2 mg total) by mouth 2 (two) times daily. 180 tablet 1  . glucose blood test strip Test blood sugar once daily. Dx: E11.9 100 each 3  . Lancets (ONETOUCH ULTRASOFT) lancets Test blood sugar once daily. Dx: E11.9 100 each 3  . latanoprost (XALATAN) 0.005 % ophthalmic solution Place 1 drop into both eyes at bedtime.    . quinapril (ACCUPRIL) 20 MG tablet Take 1 tablet (20 mg total) by mouth daily. 90 tablet 1   No current facility-administered medications on file prior to visit.     Cardiovascular studies:  Exercise Treadmill Stress Test 05/29/2018:  Indication: Chest pain The patient exercised on Bruce protocol for 09:13 min. Patient achieved 10.49 METS and reached HR 148 bpm, which is 102 % of maximum age-predicted HR. Stress test terminated due to fatigue.  Exercise capacity  was normal. HR Response to Exercise: Appropriate. BP Response to Exercise: Mildly elevated resting BP 138/86 mmHg, with midly exaggerated exercise response., peak BP 210/96 mmHg.  Chest Pain: none. Arrhythmias: none. Resting EKG demonstrates Normal sinus rhythm. ST Changes: With peak exercise there were 1 mm upsloping ST depressions in inferolateral leads. These changes are equivocal for ischemia.   Overall Impression: Equicoval stress test. Continue primary/secondary prevention. Clinical correaltion recommended. In absence of chest pain symptoms, recommend clinical follow up.  Echocardiogram 02/10/2018: Study Conclusions   - Left ventricle: The cavity size was normal. Systolic function was  normal. The estimated ejection fraction was in the range of 60%  to 65%. Wall motion was normal; there were no regional wall  motion abnormalities. Doppler parameters are consistent with  abnormal left ventricular relaxation (grade 1 diastolic  dysfunction). Doppler parameters are consistent with high  ventricular filling pressure. 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 - Aortic valve: Trileaflet; mildly thickened, mildly calcified  leaflets. 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 - Atrial septum: No defect or patent foramen ovale was identified.  EKG 11/27/2018: Sinus  Rhythm  76 bpm. Borderline first degree AV block Baseline wander seen.   Review of Systems  Constitution: Negative for decreased appetite, malaise/fatigue, weight gain and weight loss.  HENT: Negative for congestion.   Eyes: Negative for visual disturbance.  Cardiovascular: Positive for chest pain (Occasional). Negative for dyspnea on exertion, leg swelling, palpitations and syncope.  Respiratory: Negative for shortness of breath.   Endocrine: Negative for cold intolerance.  Hematologic/Lymphatic: Does not bruise/bleed easily.  Skin: Negative for itching and  rash.  Musculoskeletal: Negative for myalgias.  Gastrointestinal: Negative for abdominal pain, nausea and vomiting.  Genitourinary: Negative for dysuria.  Neurological: Negative for dizziness and weakness.  Psychiatric/Behavioral: The patient is not nervous/anxious.   All other systems reviewed and are negative.      Objective:   Vitals:   11/27/18 1417  BP: (!) 147/79  Pulse: 80  SpO2: 95%     Physical Exam  Constitutional: He is oriented to person, place, and time. He appears well-developed and well-nourished. No distress.  HENT:  Head: Normocephalic and atraumatic.  Eyes: Pupils are equal, round, and reactive to light. Conjunctivae are normal.  Neck: No JVD present.  Cardiovascular: Normal rate, regular rhythm and intact distal pulses.  No murmur heard. Pulmonary/Chest: Effort normal  and breath sounds normal. He has no wheezes. He has no rales.  Abdominal: Soft. Bowel sounds are normal. There is no rebound.  Musculoskeletal:        General: No edema.  Lymphadenopathy:    He has no cervical adenopathy.  Neurological: He is alert and oriented to person, place, and time. No cranial nerve deficit.  Skin: Skin is warm and dry.  Psychiatric: He has a normal mood and affect.  Nursing note and vitals reviewed.       Assessment & Recommendations:   78 y/o [place] American male with hypertension, hyperlipidemia, type 2 diabetes, was last seen in 05/2018 for evaluation of chest pain.    Chest pain: Only occasional. EKG treadmill stress test 05/2018 with equivocal EKG changes without chest pain. Continue aspirin, statin, and risk factor modification.  Hypertension: Suboptimal control. Increase amlodipine to 10 mg daily. Also on Quinapril 20 mg daily.   Type 2 DM: Managed by PCP.  I will see him back in 3 months.    Nigel Mormon, MD [facility] Pager: [phone] Office: [phone] If no answer Cell [phone]

## 2018-11-27 ENCOUNTER — Encounter: Payer: Self-pay | Admitting: Cardiology

## 2018-11-27 ENCOUNTER — Ambulatory Visit (INDEPENDENT_AMBULATORY_CARE_PROVIDER_SITE_OTHER): Payer: Medicare Other | Admitting: Cardiology

## 2018-11-27 VITALS — BP 147/79 | HR 80 | Ht 65.0 in | Wt 179.2 lb

## 2018-11-27 DIAGNOSIS — I10 Essential (primary) hypertension: Secondary | ICD-10-CM

## 2018-11-27 DIAGNOSIS — R0789 Other chest pain: Secondary | ICD-10-CM | POA: Diagnosis not present

## 2018-11-27 DIAGNOSIS — R079 Chest pain, unspecified: Secondary | ICD-10-CM | POA: Insufficient documentation

## 2018-11-27 MED ORDER — AMLODIPINE BESYLATE 10 MG PO TABS: 10.0000 mg | ORAL_TABLET | Freq: Every day | ORAL | 3 refills | Status: DC

## 2018-11-27 NOTE — Patient Instructions (Signed)
Please take amlodipine 10 mg daily  That is,  2 pills of 5 mg daily. When you finish that bottle., you can then take 1 pill of 10 mg daily

## 2018-12-18 ENCOUNTER — Other Ambulatory Visit: Payer: Self-pay | Admitting: Family Medicine

## 2018-12-18 DIAGNOSIS — I10 Essential (primary) hypertension: Secondary | ICD-10-CM

## 2018-12-18 DIAGNOSIS — E119 Type 2 diabetes mellitus without complications: Secondary | ICD-10-CM

## 2018-12-18 NOTE — Telephone Encounter (Signed)
Requested Prescriptions  Pending Prescriptions Disp Refills  . glimepiride (AMARYL) 2 MG tablet [Pharmacy Med Name: GLIMEPIRIDE 2 MG TABLET] 180 tablet 0    Sig: TAKE 1 TABLET BY MOUTH TWICE A DAY     Endocrinology:  Diabetes - Sulfonylureas Passed - 12/18/2018  1:44 AM      Passed - HBA1C is between 0 and 7.9 and within 180 days    Hgb A1c MFr Bld  Date Value Ref Range Status  11/14/2018 7.0 (H) 4.8 - 5.6 % Final    Comment:             Prediabetes: 5.7 - 6.4          Diabetes: >6.4          Glycemic control for adults with diabetes: <7.0          Passed - Valid encounter within last 6 months    Recent Outpatient Visits          1 month ago Type 2 diabetes mellitus with retinopathy, without long-term current use of insulin, macular edema presence unspecified, unspecified laterality, unspecified retinopathy severity ([place])   [facility], Ranell Patrick, MD   4 months ago Controlled type 2 diabetes mellitus without complication, without long-term current use of insulin [facility])   [facility], Ranell Patrick, MD   7 months ago Controlled type 2 diabetes mellitus without complication, without long-term current use of insulin [facility])   [facility], Ranell Patrick, MD   10 months ago Type 2 diabetes mellitus with retinopathy, without long-term current use of insulin, macular edema presence unspecified, unspecified laterality, unspecified retinopathy severity ([place])   [facility], Ranell Patrick, MD   1 year ago Type 2 diabetes mellitus with retinopathy, without long-term current use of insulin, macular edema presence unspecified, unspecified laterality, unspecified retinopathy severity ([place])   [facility], Ranell Patrick, MD      Future Appointments            In 2 months Carlota Raspberry Ranell Patrick, MD Primary [facility] at [place], [facility]   In 2 months Patwardhan, Reynold Bowen, MD [facility]         . quinapril (ACCUPRIL) 20 MG  tablet [Pharmacy Med Name: QUINAPRIL 20 MG TABLET] 90 tablet 0    Sig: TAKE 1 TABLET BY MOUTH EVERY DAY     Cardiovascular:  ACE Inhibitors Failed - 12/18/2018  1:44 AM      Failed - Last BP in normal range    BP Readings from Last 1 Encounters:  11/27/18 (!) 147/79         Passed - Cr in normal range and within 180 days    Creat  Date Value Ref Range Status  08/20/2016 1.19 (H) 0.70 - 1.18 mg/dL Final    Comment:      For patients > or = 78 years of age: The upper reference limit for Creatinine is approximately 13% higher for people identified as African-American.      Creatinine, Ser  Date Value Ref Range Status  08/14/2018 1.13 0.76 - 1.27 mg/dL Final         Passed - K in normal range and within 180 days    Potassium  Date Value Ref Range Status  08/14/2018 4.4 3.5 - 5.2 mmol/L Final         Passed - Patient is not pregnant      Passed -  Valid encounter within last 6 months    Recent Outpatient Visits          1 month ago Type 2 diabetes mellitus with retinopathy, without long-term current use of insulin, macular edema presence unspecified, unspecified laterality, unspecified retinopathy severity ([place])   [facility], Ranell Patrick, MD   4 months ago Controlled type 2 diabetes mellitus without complication, without long-term current use of insulin [facility])   [facility], Ranell Patrick, MD   7 months ago Controlled type 2 diabetes mellitus without complication, without long-term current use of insulin [facility])   [facility], Ranell Patrick, MD   10 months ago Type 2 diabetes mellitus with retinopathy, without long-term current use of insulin, macular edema presence unspecified, unspecified laterality, unspecified retinopathy severity ([place])   [facility], Ranell Patrick, MD   1 year ago Type 2 diabetes mellitus with retinopathy, without long-term current use of insulin, macular edema presence unspecified, unspecified  laterality, unspecified retinopathy severity ([place])   [facility], Ranell Patrick, MD      Future Appointments            In 2 months Carlota Raspberry Ranell Patrick, MD Primary Care at [place], [facility]   In 2 months Patwardhan, Reynold Bowen, MD [facility]

## 2019-01-15 ENCOUNTER — Telehealth: Payer: Self-pay | Admitting: *Deleted

## 2019-01-15 NOTE — Telephone Encounter (Signed)
Schedule AWV.  

## 2019-02-16 ENCOUNTER — Ambulatory Visit: Payer: Medicare Other | Admitting: Family Medicine

## 2019-02-26 ENCOUNTER — Ambulatory Visit: Payer: Medicare Other | Admitting: Cardiology

## 2019-03-08 DIAGNOSIS — Z20828 Contact with and (suspected) exposure to other viral communicable diseases: Secondary | ICD-10-CM | POA: Diagnosis not present

## 2019-03-15 ENCOUNTER — Telehealth: Payer: Self-pay | Admitting: Family Medicine

## 2019-03-15 MED ORDER — IPRATROPIUM BROMIDE 0.06 % NA SOLN: 1.0000 | Freq: Three times a day (TID) | NASAL | 5 refills | Status: DC

## 2019-03-15 NOTE — Telephone Encounter (Signed)
Refilled. Keep appt.

## 2019-03-15 NOTE — Telephone Encounter (Signed)
Copied from [place] [phone]. Topic: Quick Communication - Rx Refill/Question >> Mar 15, 2019  2:41 PM Sheran Luz wrote: Medication: ipratropium (ATROVENT) 0.06 % nasal spray   Patient is requesting refill of this medication.   Preferred Pharmacy (with phone number or street name):[id number] [address], [place] [phone] (Phone) [phone] (Fax)

## 2019-03-15 NOTE — Telephone Encounter (Signed)
Dr Carlota Raspberry this patient have not been seen since 11/2018 requesting this nasal spray. This spray have not been filled since 2017. Patient has an upcoming appt with you in July. Please advise

## 2019-03-18 ENCOUNTER — Other Ambulatory Visit: Payer: Self-pay | Admitting: Family Medicine

## 2019-03-18 DIAGNOSIS — I10 Essential (primary) hypertension: Secondary | ICD-10-CM

## 2019-03-18 DIAGNOSIS — E119 Type 2 diabetes mellitus without complications: Secondary | ICD-10-CM

## 2019-03-18 NOTE — Telephone Encounter (Signed)
Requested Prescriptions  Pending Prescriptions Disp Refills  . quinapril (ACCUPRIL) 20 MG tablet [Pharmacy Med Name: QUINAPRIL 20 MG TABLET] 90 tablet 0    Sig: TAKE 1 TABLET BY MOUTH EVERY DAY     Cardiovascular:  ACE Inhibitors Failed - 03/18/2019  2:22 AM      Failed - Cr in normal range and within 180 days    Creat  Date Value Ref Range Status  08/20/2016 1.19 (H) 0.70 - 1.18 mg/dL Final    Comment:      For patients > or = 78 years of age: The upper reference limit for Creatinine is approximately 13% higher for people identified as African-American.      Creatinine, Ser  Date Value Ref Range Status  08/14/2018 1.13 0.76 - 1.27 mg/dL Final         Failed - K in normal range and within 180 days    Potassium  Date Value Ref Range Status  08/14/2018 4.4 3.5 - 5.2 mmol/L Final         Failed - Last BP in normal range    BP Readings from Last 1 Encounters:  11/27/18 (!) 147/79         Passed - Patient is not pregnant      Passed - Valid encounter within last 6 months    Recent Outpatient Visits          4 months ago Type 2 diabetes mellitus with retinopathy, without long-term current use of insulin, macular edema presence unspecified, unspecified laterality, unspecified retinopathy severity ([place])   [facility], Ranell Patrick, MD   7 months ago Controlled type 2 diabetes mellitus without complication, without long-term current use of insulin ([place])   [facility], Ranell Patrick, MD   10 months ago Controlled type 2 diabetes mellitus without complication, without long-term current use of insulin ([place])   [facility], Ranell Patrick, MD   1 year ago Type 2 diabetes mellitus with retinopathy, without long-term current use of insulin, macular edema presence unspecified, unspecified laterality, unspecified retinopathy severity ([place])   [facility], Ranell Patrick, MD   1 year ago Type 2 diabetes mellitus with retinopathy,  without long-term current use of insulin, macular edema presence unspecified, unspecified laterality, unspecified retinopathy severity ([place])   [facility], Ranell Patrick, MD      Future Appointments            In 1 month [name], MD Primary Care at [place], [facility]   In 2 months Patwardhan, Reynold Bowen, MD [facility]         . glimepiride (AMARYL) 2 MG tablet [Pharmacy Med Name: GLIMEPIRIDE 2 MG TABLET] 180 tablet 0    Sig: TAKE 1 TABLET BY MOUTH TWICE A DAY     Endocrinology:  Diabetes - Sulfonylureas Passed - 03/18/2019  2:22 AM      Passed - HBA1C is between 0 and 7.9 and within 180 days    Hgb A1c MFr Bld  Date Value Ref Range Status  11/14/2018 7.0 (H) 4.8 - 5.6 % Final    Comment:             Prediabetes: 5.7 - 6.4          Diabetes: >6.4          Glycemic control for adults with diabetes: <7.0          Passed -  Valid encounter within last 6 months    Recent Outpatient Visits          4 months ago Type 2 diabetes mellitus with retinopathy, without long-term current use of insulin, macular edema presence unspecified, unspecified laterality, unspecified retinopathy severity ([place])   [facility], Ranell Patrick, MD   7 months ago Controlled type 2 diabetes mellitus without complication, without long-term current use of insulin [facility])   [facility], Ranell Patrick, MD   10 months ago Controlled type 2 diabetes mellitus without complication, without long-term current use of insulin [facility])   [facility], Ranell Patrick, MD   1 year ago Type 2 diabetes mellitus with retinopathy, without long-term current use of insulin, macular edema presence unspecified, unspecified laterality, unspecified retinopathy severity ([place])   [facility], Ranell Patrick, MD   1 year ago Type 2 diabetes mellitus with retinopathy, without long-term current use of insulin, macular edema presence unspecified, unspecified  laterality, unspecified retinopathy severity ([place])   [facility], Ranell Patrick, MD      Future Appointments            In 1 month Carlota Raspberry Ranell Patrick, MD Primary Care at [place], [facility]   In 2 months Patwardhan, Reynold Bowen, MD [facility]

## 2019-04-19 DIAGNOSIS — R972 Elevated prostate specific antigen [PSA]: Secondary | ICD-10-CM | POA: Diagnosis not present

## 2019-04-26 DIAGNOSIS — N400 Enlarged prostate without lower urinary tract symptoms: Secondary | ICD-10-CM | POA: Diagnosis not present

## 2019-04-26 DIAGNOSIS — R972 Elevated prostate specific antigen [PSA]: Secondary | ICD-10-CM | POA: Diagnosis not present

## 2019-05-16 ENCOUNTER — Encounter: Payer: Self-pay | Admitting: Family Medicine

## 2019-05-16 ENCOUNTER — Ambulatory Visit (INDEPENDENT_AMBULATORY_CARE_PROVIDER_SITE_OTHER): Payer: Medicare Other | Admitting: Family Medicine

## 2019-05-16 ENCOUNTER — Other Ambulatory Visit: Payer: Self-pay

## 2019-05-16 VITALS — BP 140/82 | HR 74 | Temp 98.3°F | Resp 14 | Wt 173.4 lb

## 2019-05-16 DIAGNOSIS — I10 Essential (primary) hypertension: Secondary | ICD-10-CM

## 2019-05-16 DIAGNOSIS — E785 Hyperlipidemia, unspecified: Secondary | ICD-10-CM

## 2019-05-16 DIAGNOSIS — E11319 Type 2 diabetes mellitus with unspecified diabetic retinopathy without macular edema: Secondary | ICD-10-CM | POA: Diagnosis not present

## 2019-05-16 MED ORDER — AMLODIPINE BESYLATE 10 MG PO TABS: 10.0000 mg | ORAL_TABLET | Freq: Every day | ORAL | 1 refills | Status: DC

## 2019-05-16 MED ORDER — GLIMEPIRIDE 2 MG PO TABS: 2.0000 mg | ORAL_TABLET | Freq: Two times a day (BID) | ORAL | 1 refills | Status: DC

## 2019-05-16 MED ORDER — ATORVASTATIN CALCIUM 20 MG PO TABS: 20.0000 mg | ORAL_TABLET | Freq: Every day | ORAL | 1 refills | Status: DC

## 2019-05-16 MED ORDER — QUINAPRIL HCL 20 MG PO TABS: 20.0000 mg | ORAL_TABLET | Freq: Every day | ORAL | 1 refills | Status: DC

## 2019-05-16 NOTE — Progress Notes (Signed)
Subjective:    Patient ID: Tony Price, male    DOB: June 30, 1941, 78 y.o.   MRN: [id number]  HPI Tony Price is a 78 y.o. male Presents today for: Chief Complaint  Patient presents with  . Diabetes    3 month f/u on diabetes. Blood sugar has been running 117 in the am but have not taking this am   Diabetes: Complicated by retinopathy.  On ACE- I and statin.  Amaryl 2 mg twice daily. Microalbumin: Normal ratio 11/14/2018 Optho, foot exam, pneumovax: declines Pneumovax today, otherwise up-to-date. Six 6 pound weight loss since February - increased walking past few months.  Home readings 117-134 Lab Results  Component Value Date   HGBA1C 7.0 (H) 11/14/2018   HGBA1C 7.2 (H) 08/14/2018   HGBA1C 7.2 (H) 05/16/2018   Lab Results  Component Value Date   MICROALBUR 1.1 08/04/2015   LDLCALC 76 08/14/2018   CREATININE 1.13 08/14/2018   Wt Readings from Last 3 Encounters:  05/16/19 173 lb 6.4 oz (78.7 kg)  11/27/18 179 lb 3.2 oz (81.3 kg)  11/14/18 177 lb 9.6 oz (80.6 kg)   Hyperlipidemia:  Lab Results  Component Value Date   CHOL 150 08/14/2018   HDL 65 08/14/2018   LDLCALC 76 08/14/2018   TRIG 44 08/14/2018   CHOLHDL 2.3 08/14/2018   Lab Results  Component Value Date   ALT 12 08/14/2018   AST 18 08/14/2018   ALKPHOS 89 08/14/2018   BILITOT 0.4 08/14/2018  Lipitor 20 mg daily.  No new side effects/myalgias.   Hypertension: BP Readings from Last 3 Encounters:  05/16/19 140/82  11/27/18 (!) 147/79  11/14/18 134/78   Lab Results  Component Value Date   CREATININE 1.13 08/14/2018  Amlodipine 10 mg daily, Accupril 20 mg daily. Home BP up to 150/70.  No chest pain, no new side effects.  Asa 34m qd. No hx PUD.   Patient Active Problem List   Diagnosis Date Noted  . Chest pain   . Cough 10/21/2017  . Acute upper respiratory infection 10/21/2017  . Diabetic retinopathy ([place] 03/31/2016  . Onychomycosis 02/25/2016  . UTI (lower urinary tract infection)  11/17/2014  . DM (diabetes mellitus), type 2 ([facility] [place] 11/17/2014  . HTN (hypertension) 11/17/2014  . Sepsis ([facility] 11/17/2014   Past Medical History:  Diagnosis Date  . Chest pain   . Diabetes mellitus   . Hypercholesterolemia   . Hypertension   . Kidney stones    Past Surgical History:  Procedure Laterality Date  . CATARACT EXTRACTION    . EYE SURGERY    . PROSTATE BIOPSY    . ROTATOR CUFF REPAIR     Right   No Known Allergies Prior to Admission medications   Medication Sig Start Date End Date Taking? Authorizing Provider  amLODipine (NORVASC) 10 MG tablet Take 1 tablet (10 mg total) by mouth daily. 11/27/18   Patwardhan, MReynold Bowen MD  aspirin 81 MG chewable tablet Chew by mouth daily.    [provider]  atorvastatin (LIPITOR) 20 MG tablet TAKE 1 TABLET BY MOUTH EVERY DAY 09/28/18   GWendie Agreste MD  blood glucose meter kit and supplies KIT 1 touch ultra meter.   Check blood sugar once daily E11.9 07/14/17   GWendie Agreste MD  Blood Glucose Monitoring Suppl (ONE TOUCH ULTRA SYSTEM KIT) w/Device KIT Test blood sugar once daily. Dx: E11.9 07/01/16   GWendie Agreste MD  glimepiride (AMARYL) 2 MG tablet TAKE 1 TABLET BY  MOUTH TWICE A DAY 03/18/19   [name], MD  glucose blood test strip Test blood sugar once daily. Dx: E11.9 07/04/17   [name], MD  ipratropium (ATROVENT) 0.06 % nasal spray Place 1-2 sprays into both nostrils 3 (three) times daily. As needed for nasal congestion 03/15/19   [name], MD  Lancets [facility] ULTRASOFT) lancets Test blood sugar once daily. Dx: E11.9 07/04/17   [name], MD  latanoprost (XALATAN) 0.005 % ophthalmic solution Place 1 drop into both eyes at bedtime.    [provider]  quinapril (ACCUPRIL) 20 MG tablet TAKE 1 TABLET BY MOUTH EVERY DAY 03/18/19   [name], MD   Social History   Socioeconomic History  . Marital status: Divorced    Spouse name: Not on file  . Number of  children: 3  . Years of education: Not on file  . Highest education level: Not on file  Occupational History  . Not on file  Social Needs  . Financial resource strain: Not on file  . Food insecurity    Worry: Not on file    Inability: Not on file  . Transportation needs    Medical: Not on file    Non-medical: Not on file  Tobacco Use  . Smoking status: Former Smoker    Years: 2.00  . Smokeless tobacco: Never Used  Substance and Sexual Activity  . Alcohol use: No    Alcohol/week: 0.0 standard drinks  . Drug use: No  . Sexual activity: Not on file  Lifestyle  . Physical activity    Days per week: Not on file    Minutes per session: Not on file  . Stress: Not on file  Relationships  . Social Herbalist on phone: Not on file    Gets together: Not on file    Attends religious service: Not on file    Active member of club or organization: Not on file    Attends meetings of clubs or organizations: Not on file    Relationship status: Not on file  . Intimate partner violence    Fear of current or ex partner: Not on file    Emotionally abused: Not on file    Physically abused: Not on file    Forced sexual activity: Not on file  Other Topics Concern  . Not on file  Social History Narrative  . Not on file    Review of Systems  Constitutional: Negative for fatigue and unexpected weight change.  Eyes: Negative for visual disturbance.  Respiratory: Negative for cough, chest tightness and shortness of breath.   Cardiovascular: Negative for chest pain, palpitations and leg swelling.  Gastrointestinal: Negative for abdominal pain and blood in stool.  Neurological: Negative for dizziness, light-headedness and headaches.       Objective:   Physical Exam Vitals signs reviewed.  Constitutional:      Appearance: He is well-developed.  HENT:     Head: Normocephalic and atraumatic.  Eyes:     Pupils: Pupils are equal, round, and reactive to light.  Neck:      Vascular: No carotid bruit or JVD.  Cardiovascular:     Rate and Rhythm: Normal rate and regular rhythm.     Heart sounds: Normal heart sounds. No murmur.  Pulmonary:     Effort: Pulmonary effort is normal.     Breath sounds: Normal breath sounds. No rales.  Skin:    General: Skin is  warm and dry.  Neurological:     Mental Status: He is alert and oriented to person, place, and time.    Vitals:   05/16/19 0809  BP: 140/82  Pulse: 74  Resp: 14  Temp: 98.3 F (36.8 C)  TempSrc: Oral  SpO2: 98%  Weight: 173 lb 6.4 oz (78.7 kg)       Assessment & Plan:   Tony Price is a 78 y.o. male Type 2 diabetes mellitus with retinopathy, without long-term current use of insulin, macular edema presence unspecified, unspecified laterality, unspecified retinopathy severity ([place]) - Plan: Hemoglobin A1c, glimepiride (AMARYL) 2 MG tablet,   -Anticipate improved readings with weight loss.  Commended on walking/exercise.  Continue glimepiride same dose for now.  Pneumonia vaccine was declined at this time.  Ongoing follow-up with ophthalmologist with history of retinopathy.  Essential hypertension - Plan: Comprehensive metabolic panel, quinapril (ACCUPRIL) 20 MG tablet, amLODipine (NORVASC) 10 MG tablet,   -Borderline, but anticipate improved readings as exercise continues.  Continue same regimen, watch sodium in the diet, recheck 3 months  Hyperlipidemia, unspecified hyperlipidemia type - Plan: atorvastatin (LIPITOR) 20 MG tablet, Lipid panel,   -Check lipids, CMP.  Tolerating statin, continue Lipitor 20 mg daily   Meds ordered this encounter  Medications  . atorvastatin (LIPITOR) 20 MG tablet    Sig: Take 1 tablet (20 mg total) by mouth daily.    Dispense:  90 tablet    Refill:  1  . glimepiride (AMARYL) 2 MG tablet    Sig: Take 1 tablet (2 mg total) by mouth 2 (two) times daily.    Dispense:  180 tablet    Refill:  1  . quinapril (ACCUPRIL) 20 MG tablet    Sig: Take 1 tablet (20 mg  total) by mouth daily.    Dispense:  90 tablet    Refill:  1  . amLODipine (NORVASC) 10 MG tablet    Sig: Take 1 tablet (10 mg total) by mouth daily.    Dispense:  90 tablet    Refill:  1   Patient Instructions     Keep up the good work with exercise.  Blood pressure is borderline elevated here in the office, but okay to remain on same medicine as a suspect the continued exercise will help.  Also be careful with sodium/salt in the diet.  Follow-up in 3 months.  Let me know if there are questions in the meantime and take care.   If you have lab work done today you will be contacted with your lab results within the next 2 weeks.  If you have not heard from [place] then please contact us. The fastest way to get your results is to register for My Chart.   IF you received an x-ray today, you will receive an invoice from [facility]. Please contact [facility] at [phone] with questions or concerns regarding your invoice.   IF you received labwork today, you will receive an invoice from [facility]. Please contact [facility] at [phone] with questions or concerns regarding your invoice.   Our billing staff will not be able to assist you with questions regarding bills from these companies.  You will be contacted with the lab results as soon as they are available. The fastest way to get your results is to activate your My Chart account. Instructions are located on the last page of this paperwork. If you have not heard from [place] regarding the results in 2 weeks, please contact this office.     \  Signed,   [name], MD [facility].  05/16/19 8:25 AM

## 2019-05-16 NOTE — Patient Instructions (Addendum)
   Keep up the good work with exercise.  Blood pressure is borderline elevated here in the office, but okay to remain on same medicine as a suspect the continued exercise will help.  Also be careful with sodium/salt in the diet.  Follow-up in 3 months.  Let me know if there are questions in the meantime and take care.   If you have lab work done today you will be contacted with your lab results within the next 2 weeks.  If you have not heard from [place] then please contact us. The fastest way to get your results is to register for My Chart.   IF you received an x-ray today, you will receive an invoice from [facility]. Please contact [facility] at [phone] with questions or concerns regarding your invoice.   IF you received labwork today, you will receive an invoice from [place]. Please contact [facility] at [phone] with questions or concerns regarding your invoice.   Our billing staff will not be able to assist you with questions regarding bills from these companies.  You will be contacted with the lab results as soon as they are available. The fastest way to get your results is to activate your My Chart account. Instructions are located on the last page of this paperwork. If you have not heard from [place] regarding the results in 2 weeks, please contact this office.     \

## 2019-05-17 LAB — COMPREHENSIVE METABOLIC PANEL
ALT: 9 IU/L (ref 0–44)
AST: 17 IU/L (ref 0–40)
Albumin/Globulin Ratio: 1.6 (ref 1.2–2.2)
Albumin: 4.2 g/dL (ref 3.7–4.7)
Alkaline Phosphatase: 95 IU/L (ref 39–117)
BUN/Creatinine Ratio: 11 (ref 10–24)
BUN: 13 mg/dL (ref 8–27)
Bilirubin Total: 0.5 mg/dL (ref 0.0–1.2)
CO2: 25 mmol/L (ref 20–29)
Calcium: 9.2 mg/dL (ref 8.6–10.2)
Chloride: 103 mmol/L (ref 96–106)
Creatinine, Ser: 1.2 mg/dL (ref 0.76–1.27)
GFR calc Af Amer: 67 mL/min/{1.73_m2} (ref >59)
GFR calc non Af Amer: 58 mL/min/{1.73_m2} — ABNORMAL LOW (ref >59)
Globulin, Total: 2.6 g/dL (ref 1.5–4.5)
Glucose: 133 mg/dL — ABNORMAL HIGH (ref 65–99)
Potassium: 4.5 mmol/L (ref 3.5–5.2)
Sodium: 142 mmol/L (ref 134–144)
Total Protein: 6.8 g/dL (ref 6.0–8.5)

## 2019-05-17 LAB — LIPID PANEL
Chol/HDL Ratio: 2.7 ratio (ref 0.0–5.0)
Cholesterol, Total: 163 mg/dL (ref 100–199)
HDL: 61 mg/dL (ref >39)
LDL Calculated: 85 mg/dL (ref 0–99)
Triglycerides: 85 mg/dL (ref 0–149)
VLDL Cholesterol Cal: 17 mg/dL (ref 5–40)

## 2019-05-17 LAB — HEMOGLOBIN A1C
Est. average glucose Bld gHb Est-mCnc: 157 mg/dL
Hgb A1c MFr Bld: 7.1 % — ABNORMAL HIGH (ref 4.8–5.6)

## 2019-06-01 DIAGNOSIS — H35033 Hypertensive retinopathy, bilateral: Secondary | ICD-10-CM | POA: Diagnosis not present

## 2019-06-01 DIAGNOSIS — E113293 Type 2 diabetes mellitus with mild nonproliferative diabetic retinopathy without macular edema, bilateral: Secondary | ICD-10-CM | POA: Diagnosis not present

## 2019-06-01 DIAGNOSIS — H402231 Chronic angle-closure glaucoma, bilateral, mild stage: Secondary | ICD-10-CM | POA: Diagnosis not present

## 2019-06-01 DIAGNOSIS — H35341 Macular cyst, hole, or pseudohole, right eye: Secondary | ICD-10-CM | POA: Diagnosis not present

## 2019-06-01 LAB — HM DIABETES EYE EXAM

## 2019-06-04 ENCOUNTER — Telehealth: Payer: Medicare Other | Admitting: Cardiology

## 2019-06-06 DIAGNOSIS — Z1159 Encounter for screening for other viral diseases: Secondary | ICD-10-CM | POA: Diagnosis not present

## 2019-06-08 ENCOUNTER — Encounter: Payer: Self-pay | Admitting: Family Medicine

## 2019-06-11 ENCOUNTER — Telehealth: Payer: Self-pay | Admitting: *Deleted

## 2019-06-11 NOTE — Telephone Encounter (Signed)
Schedule AWV.  

## 2019-06-13 ENCOUNTER — Ambulatory Visit: Payer: Medicare Other

## 2019-06-13 ENCOUNTER — Other Ambulatory Visit: Payer: Self-pay

## 2019-06-13 ENCOUNTER — Telehealth (INDEPENDENT_AMBULATORY_CARE_PROVIDER_SITE_OTHER): Payer: Medicare Other | Admitting: Cardiology

## 2019-06-13 VITALS — BP 130/55

## 2019-06-13 DIAGNOSIS — I10 Essential (primary) hypertension: Secondary | ICD-10-CM

## 2019-06-13 DIAGNOSIS — R0789 Other chest pain: Secondary | ICD-10-CM

## 2019-06-13 NOTE — Progress Notes (Signed)
Patient is here for follow up visit.  Subjective:   '@Patient'  ID: Tony Price, male    DOB: 11-04-1940, 78 y.o.   MRN: [id number]   I connected with the patient on 06/13/2019 by a telephone call and verified that I am speaking with the correct person using two identifiers.     I offered the patient a video enabled application for a virtual visit. Unfortunately, this could not be accomplished due to technical difficulties/lack of video enabled phone/computer. I discussed the limitations of evaluation and management by telemedicine and the availability of in person appointments. The patient expressed understanding and agreed to proceed.   This visit type was conducted due to national recommendations for restrictions regarding the COVID-19 Pandemic (e.g. social distancing).  This format is felt to be most appropriate for this patient at this time.  All issues noted in this document were discussed and addressed.  No physical exam was performed (except for noted visual exam findings with Tele health visits).  The patient has consented to conduct a Tele health visit and understands insurance will be billed.    Chief Complaint  Patient presents with  . Chest Pain    HPI  78 y/o [place] male with hypertension, hyperlipidemia, type 2 diabetes, was last seen in 05/2018 for evaluation of chest pain.    Workup with stress test in 2019 showed equivocal EKG changes without any chest pain. I had then recommended continued medical management and follow up. Patient is very active and walks 3-5 miles 2-3 times/week without any symptoms of chest pain, shortness of breath. Blood pressure and diabetes are well controlled. He has regular follow up with his PCP Dr. Carlota Raspberry.   Past Medical History:  Diagnosis Date  . Chest pain   . Diabetes mellitus   . Hypercholesterolemia   . Hypertension   . Kidney stones     Past Surgical History:  Procedure Laterality Date  . CATARACT EXTRACTION     . EYE SURGERY    . PROSTATE BIOPSY    . ROTATOR CUFF REPAIR     Right    Social History   Socioeconomic History  . Marital status: Divorced    Spouse name: Not on file  . Number of children: 3  . Years of education: Not on file  . Highest education level: Not on file  Occupational History  . Not on file  Social Needs  . Financial resource strain: Not on file  . Food insecurity    Worry: Not on file    Inability: Not on file  . Transportation needs    Medical: Not on file    Non-medical: Not on file  Tobacco Use  . Smoking status: Former Smoker    Years: 2.00  . Smokeless tobacco: Never Used  Substance and Sexual Activity  . Alcohol use: No    Alcohol/week: 0.0 standard drinks  . Drug use: No  . Sexual activity: Not on file  Lifestyle  . Physical activity    Days per week: Not on file    Minutes per session: Not on file  . Stress: Not on file  Relationships  . Social Herbalist on phone: Not on file    Gets together: Not on file    Attends religious service: Not on file    Active member of club or organization: Not on file    Attends meetings of clubs or organizations: Not on file    Relationship status:  Not on file  . Intimate partner violence    Fear of current or ex partner: Not on file    Emotionally abused: Not on file    Physically abused: Not on file    Forced sexual activity: Not on file  Other Topics Concern  . Not on file  Social History Narrative  . Not on file    Current Outpatient Medications on File Prior to Visit  Medication Sig Dispense Refill  . amLODipine (NORVASC) 10 MG tablet Take 1 tablet (10 mg total) by mouth daily. 90 tablet 1  . aspirin 81 MG chewable tablet Chew by mouth daily.    [name] atorvastatin (LIPITOR) 20 MG tablet Take 1 tablet (20 mg total) by mouth daily. 90 tablet 1  . blood glucose meter kit and supplies KIT 1 touch ultra meter.   Check blood sugar once daily E11.9 1 each 0  . Blood Glucose Monitoring Suppl  (ONE TOUCH ULTRA SYSTEM KIT) w/Device KIT Test blood sugar once daily. Dx: E11.9 1 each 0  . glimepiride (AMARYL) 2 MG tablet Take 1 tablet (2 mg total) by mouth 2 (two) times daily. 180 tablet 1  . glucose blood test strip Test blood sugar once daily. Dx: E11.9 100 each 3  . ipratropium (ATROVENT) 0.06 % nasal spray Place 1-2 sprays into both nostrils 3 (three) times daily. As needed for nasal congestion 15 mL 5  . Lancets (ONETOUCH ULTRASOFT) lancets Test blood sugar once daily. Dx: E11.9 100 each 3  . latanoprost (XALATAN) 0.005 % ophthalmic solution Place 1 drop into both eyes at bedtime.    . quinapril (ACCUPRIL) 20 MG tablet Take 1 tablet (20 mg total) by mouth daily. 90 tablet 1   No current facility-administered medications on file prior to visit.     Cardiovascular studies:  Exercise Treadmill Stress Test 05/29/2018:  Indication: Chest pain The patient exercised on Bruce protocol for 09:13 min. Patient achieved 10.49 METS and reached HR 148 bpm, which is 102 % of maximum age-predicted HR. Stress test terminated due to fatigue.  Exercise capacity was normal. HR Response to Exercise: Appropriate. BP Response to Exercise: Mildly elevated resting BP 138/86 mmHg, with midly exaggerated exercise response., peak BP 210/96 mmHg.  Chest Pain: none. Arrhythmias: none. Resting EKG demonstrates Normal sinus rhythm. ST Changes: With peak exercise there were 1 mm upsloping ST depressions in inferolateral leads. These changes are equivocal for ischemia.   Overall Impression: Equicoval stress test. Continue primary/secondary prevention. Clinical correaltion recommended. In absence of chest pain symptoms, recommend clinical follow up.  Echocardiogram 02/10/2018: Study Conclusions   - Left ventricle: The cavity size was normal. Systolic function was  normal. The estimated ejection fraction was in the range of 60%  to 65%. Wall motion was normal; there were no regional wall  motion  abnormalities. Doppler parameters are consistent with  abnormal left ventricular relaxation (grade 1 diastolic  dysfunction). Doppler parameters are consistent with high  ventricular filling pressure. 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 - Aortic valve: Trileaflet; mildly thickened, mildly calcified  leaflets. 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 - Atrial septum: No defect or patent foramen ovale was identified.  EKG 11/27/2018: Sinus  Rhythm  76 bpm. Borderline first degree AV block Baseline wander seen.   Review of Systems  Constitution: Negative for decreased appetite, malaise/fatigue, weight gain and weight loss.  HENT: Negative for congestion.   Eyes: Negative for visual disturbance.  Cardiovascular: Positive  for chest pain (Occasional). Negative for dyspnea on exertion, leg swelling, palpitations and syncope.  Respiratory: Negative for shortness of breath.   Endocrine: Negative for cold intolerance.  Hematologic/Lymphatic: Does not bruise/bleed easily.  Skin: Negative for itching and rash.  Musculoskeletal: Negative for myalgias.  Gastrointestinal: Negative for abdominal pain, nausea and vomiting.  Genitourinary: Negative for dysuria.  Neurological: Negative for dizziness and weakness.  Psychiatric/Behavioral: The patient is not nervous/anxious.   All other systems reviewed and are negative.      Objective:    Vitals:   06/06/19 1253  BP: (!) 130/55     Physical Exam  Not performed. Telephone visit.       Assessment & Recommendations:   78 y/o [place] American male with hypertension, hyperlipidemia, type 2 diabetes, was last seen in 05/2018 for evaluation of chest pain.    Chest pain: No recurrence, in spite of 3-5 mile walks. Continue aspirin, statin, and risk factor modification.  Hypertension: Well controlled.   Type 2 DM: Well controlled. Managed by PCP.  I will see him on as needed  basis, in case of recurrence of chest pain symptoms.   Nigel Mormon, MD [facility] Pager: [phone] Office: [phone] If no answer Cell [phone]

## 2019-06-14 ENCOUNTER — Ambulatory Visit (INDEPENDENT_AMBULATORY_CARE_PROVIDER_SITE_OTHER): Payer: Medicare Other | Admitting: Family Medicine

## 2019-06-14 VITALS — BP 140/82 | Ht 65.0 in | Wt 173.0 lb

## 2019-06-14 DIAGNOSIS — Z0000 Encounter for general adult medical examination without abnormal findings: Secondary | ICD-10-CM

## 2019-06-14 NOTE — Progress Notes (Signed)
Presents today for [employer] Visit   Date of last exam: 05/16/2019  Interpreter used for this visit? No  I connected with  [name] on 06/14/19 by a telephone and verified that I am speaking with the correct person using two identifiers.     Patient Care Team: [name], MD as PCP - General (Family Medicine) Hayden Pedro, MD as Consulting Physician (Ophthalmology) Monna Fam, MD as Consulting Physician (Ophthalmology)   Other items to address today:   Discussed Immunizations Patient declined FLU-shot Discussed Eye/ Dental   Other Screening: Last screening for diabetes: 05/16/2019 Last lipid screening: 05/16/2019  ADVANCE DIRECTIVES: Discussed:  yes On File:no patient will bring copy when finished Materials Provided:  No  (patient states in process of doing this through lawyer.  Immunization status:  Immunization History  Administered Date(s) Administered  . Tdap 10/18/2010     Health Maintenance Due  Topic Date Due  . FOOT EXAM  05/17/2019     Functional Status Survey: Is the patient deaf or have difficulty hearing?: No Does the patient have difficulty seeing, even when wearing glasses/contacts?: No Does the patient have difficulty concentrating, remembering, or making decisions?: No Does the patient have difficulty walking or climbing stairs?: No Does the patient have difficulty dressing or bathing?: No Does the patient have difficulty doing errands alone such as visiting a doctor's office or shopping?: No   6CIT Screen 06/14/2019 07/08/2017  What Year? 0 points 0 points  What month? 0 points 0 points  What time? 0 points 0 points  Count back from 20 0 points 0 points  Months in reverse 0 points 0 points  Repeat phrase 0 points 6 points  Total Score 0 6        Clinical Support from 06/14/2019 in [facility] at [facility] [place]  AUDIT-C Score  0       Home Environment:   Lives in one story home  No trouble  climbing stairs No scattered rugs Yes Grab bars Adequate lighting/ no clutter   Patient Active Problem List   Diagnosis Date Noted  . Chest pain   . Cough 10/21/2017  . Acute upper respiratory infection 10/21/2017  . Diabetic retinopathy ([place]) 03/31/2016  . Onychomycosis 02/25/2016  . UTI (lower urinary tract infection) 11/17/2014  . DM (diabetes mellitus), type 2 ([place]) 11/17/2014  . HTN (hypertension) 11/17/2014  . Sepsis ([place]) 11/17/2014     Past Medical History:  Diagnosis Date  . Chest pain   . Diabetes mellitus   . Hypercholesterolemia   . Hypertension   . Kidney stones      Past Surgical History:  Procedure Laterality Date  . CATARACT EXTRACTION    . EYE SURGERY    . PROSTATE BIOPSY    . ROTATOR CUFF REPAIR     Right     Family History  Family history unknown: Yes     Social History   Socioeconomic History  . Marital status: Divorced    Spouse name: Not on file  . Number of children: 3  . Years of education: Not on file  . Highest education level: Not on file  Occupational History  . Not on file  Social Needs  . Financial resource strain: Not on file  . Food insecurity    Worry: Not on file    Inability: Not on file  . Transportation needs    Medical: Not on file    Non-medical: Not on file  Tobacco Use  . Smoking status: Former Smoker    Years: 2.00  . Smokeless tobacco: Never Used  Substance and Sexual Activity  . Alcohol use: No    Alcohol/week: 0.0 standard drinks  . Drug use: No  . Sexual activity: Not on file  Lifestyle  . Physical activity    Days per week: Not on file    Minutes per session: Not on file  . Stress: Not on file  Relationships  . Social Herbalist on phone: Not on file    Gets together: Not on file    Attends religious service: Not on file    Active member of club or organization: Not on file    Attends meetings of clubs or organizations: Not on file    Relationship status: Not on file  .  Intimate partner violence    Fear of current or ex partner: Not on file    Emotionally abused: Not on file    Physically abused: Not on file    Forced sexual activity: Not on file  Other Topics Concern  . Not on file  Social History Narrative  . Not on file     No Known Allergies   Prior to Admission medications   Medication Sig Start Date End Date Taking? Authorizing Provider  amLODipine (NORVASC) 10 MG tablet Take 1 tablet (10 mg total) by mouth daily. 05/16/19  Yes [name], MD  aspirin 81 MG chewable tablet Chew by mouth daily.   Yes [provider]  atorvastatin (LIPITOR) 20 MG tablet Take 1 tablet (20 mg total) by mouth daily. 05/16/19  Yes [name], MD  blood glucose meter kit and supplies KIT 1 touch ultra meter.   Check blood sugar once daily E11.9 07/14/17  Yes [name], MD  Blood Glucose Monitoring Suppl (ONE TOUCH ULTRA SYSTEM KIT) w/Device KIT Test blood sugar once daily. Dx: E11.9 07/01/16  Yes [name], MD  glimepiride (AMARYL) 2 MG tablet Take 1 tablet (2 mg total) by mouth 2 (two) times daily. 05/16/19  Yes [name], MD  glucose blood test strip Test blood sugar once daily. Dx: E11.9 07/04/17  Yes [name], MD  Lancets [facility] ULTRASOFT) lancets Test blood sugar once daily. Dx: E11.9 07/04/17  Yes [name], MD  latanoprost (XALATAN) 0.005 % ophthalmic solution Place 1 drop into both eyes at bedtime.   Yes [provider]  quinapril (ACCUPRIL) 20 MG tablet Take 1 tablet (20 mg total) by mouth daily. 05/16/19  Yes [name], MD  ipratropium (ATROVENT) 0.06 % nasal spray Place 1-2 sprays into both nostrils 3 (three) times daily. As needed for nasal congestion Patient not taking: Reported on 06/14/2019 03/15/19   [name], MD     Depression screen [facility] 2/9 06/14/2019 05/16/2019 11/14/2018 08/14/2018 05/16/2018  Decreased Interest 0 0 0 0 0  Down, Depressed, Hopeless 0 0 0 0 0  PHQ - 2  Score 0 0 0 0 0     Fall Risk  06/14/2019 05/16/2019 11/14/2018 08/14/2018 05/16/2018  Falls in the past year? 0 0 0 No No  Number falls in past yr: 0 0 - - -  Injury with Fall? 0 0 - - -  Follow up Falls evaluation completed;Education provided;Falls prevention discussed Falls evaluation completed - - -      PHYSICAL EXAM: BP 140/82 Comment: taken from previous visit  Ht '5\' 5"'  (1.651 m)  Wt 173 lb (78.5 kg) Comment: taken from previous visit  BMI 28.79 kg/m    Wt Readings from Last 3 Encounters:  06/14/19 173 lb (78.5 kg)  06/13/19 173 lb (78.5 kg)  05/16/19 173 lb 6.4 oz (78.7 kg)     Encounter for Medicare annual wellness exam    Physical Exam   Education/Counseling provided regarding diet and exercise, prevention of chronic diseases, smoking/tobacco cessation, if applicable, and reviewed "Covered Medicare Preventive Services."

## 2019-06-14 NOTE — Patient Instructions (Signed)
Thank you for taking time to come for your Medicare Wellness Visit. I appreciate your ongoing commitment to your health goals. Please review the following plan we discussed and let me know if I can assist you in the future.  Julie Greer LPN  Preventive Care 65 Years and Older, Male Preventive care refers to lifestyle choices and visits with your health care provider that can promote health and wellness. This includes:  A yearly physical exam. This is also called an annual well check.  Regular dental and eye exams.  Immunizations.  Screening for certain conditions.  Healthy lifestyle choices, such as diet and exercise. What can I expect for my preventive care visit? Physical exam Your health care provider will check:  Height and weight. These may be used to calculate body mass index (BMI), which is a measurement that tells if you are at a healthy weight.  Heart rate and blood pressure.  Your skin for abnormal spots. Counseling Your health care provider may ask you questions about:  Alcohol, tobacco, and drug use.  Emotional well-being.  Home and relationship well-being.  Sexual activity.  Eating habits.  History of falls.  Memory and ability to understand (cognition).  Work and work environment. What immunizations do I need?  Influenza (flu) vaccine  This is recommended every year. Tetanus, diphtheria, and pertussis (Tdap) vaccine  You may need a Td booster every 10 years. Varicella (chickenpox) vaccine  You may need this vaccine if you have not already been vaccinated. Zoster (shingles) vaccine  You may need this after age 60. Pneumococcal conjugate (PCV13) vaccine  One dose is recommended after age 78. Pneumococcal polysaccharide (PPSV23) vaccine  One dose is recommended after age 78. Measles, mumps, and rubella (MMR) vaccine  You may need at least one dose of MMR if you were born in 1957 or later. You may also need a second dose. Meningococcal  conjugate (MenACWY) vaccine  You may need this if you have certain conditions. Hepatitis A vaccine  You may need this if you have certain conditions or if you travel or work in places where you may be exposed to hepatitis A. Hepatitis B vaccine  You may need this if you have certain conditions or if you travel or work in places where you may be exposed to hepatitis B. Haemophilus influenzae type b (Hib) vaccine  You may need this if you have certain conditions. You may receive vaccines as individual doses or as more than one vaccine together in one shot (combination vaccines). Talk with your health care provider about the risks and benefits of combination vaccines. What tests do I need? Blood tests  Lipid and cholesterol levels. These may be checked every 5 years, or more frequently depending on your overall health.  Hepatitis C test.  Hepatitis B test. Screening  Lung cancer screening. You may have this screening every year starting at age 55 if you have a 30-pack-year history of smoking and currently smoke or have quit within the past 15 years.  Colorectal cancer screening. All adults should have this screening starting at age 50 and continuing until age 75. Your health care provider may recommend screening at age 45 if you are at increased risk. You will have tests every 1-10 years, depending on your results and the type of screening test.  Prostate cancer screening. Recommendations will vary depending on your family history and other risks.  Diabetes screening. This is done by checking your blood sugar (glucose) after you have not eaten for   a while (fasting). You may have this done every 1-3 years.  Abdominal aortic aneurysm (AAA) screening. You may need this if you are a current or former smoker.  Sexually transmitted disease (STD) testing. Follow these instructions at home: Eating and drinking  Eat a diet that includes fresh fruits and vegetables, whole grains, lean  protein, and low-fat dairy products. Limit your intake of foods with high amounts of sugar, saturated fats, and salt.  Take vitamin and mineral supplements as recommended by your health care provider.  Do not drink alcohol if your health care provider tells you not to drink.  If you drink alcohol: ? Limit how much you have to 0-2 drinks a day. ? Be aware of how much alcohol is in your drink. In the [place] one drink equals one 12 oz bottle of beer (355 mL), one 5 oz glass of wine (148 mL), or one 1 oz glass of hard liquor (44 mL). Lifestyle  Take daily care of your teeth and gums.  Stay active. Exercise for at least 30 minutes on 5 or more days each week.  Do not use any products that contain nicotine or tobacco, such as cigarettes, e-cigarettes, and chewing tobacco. If you need help quitting, ask your health care provider.  If you are sexually active, practice safe sex. Use a condom or other form of protection to prevent STIs (sexually transmitted infections).  Talk with your health care provider about taking a low-dose aspirin or statin. What's next?  Visit your health care provider once a year for a well check visit.  Ask your health care provider how often you should have your eyes and teeth checked.  Stay up to date on all vaccines. This information is not intended to replace advice given to you by your health care provider. Make sure you discuss any questions you have with your health care provider. Document Released: 10/31/2015 Document Revised: 09/28/2018 Document Reviewed: 09/28/2018 Elsevier Patient Education  2020 [employer].

## 2019-06-21 ENCOUNTER — Other Ambulatory Visit: Payer: Self-pay

## 2019-06-21 ENCOUNTER — Encounter: Payer: Self-pay | Admitting: Family Medicine

## 2019-06-21 ENCOUNTER — Ambulatory Visit (INDEPENDENT_AMBULATORY_CARE_PROVIDER_SITE_OTHER): Payer: Medicare Other | Admitting: Family Medicine

## 2019-06-21 VITALS — BP 147/76 | HR 78 | Temp 99.1°F | Resp 17 | Ht 65.0 in | Wt 171.6 lb

## 2019-06-21 DIAGNOSIS — N3000 Acute cystitis without hematuria: Secondary | ICD-10-CM

## 2019-06-21 DIAGNOSIS — R399 Unspecified symptoms and signs involving the genitourinary system: Secondary | ICD-10-CM

## 2019-06-21 LAB — POCT URINALYSIS DIP (MANUAL ENTRY)
Bilirubin, UA: NEGATIVE
Blood, UA: NEGATIVE
Glucose, UA: NEGATIVE mg/dL
Nitrite, UA: NEGATIVE
Protein Ur, POC: 30 mg/dL — AB
Spec Grav, UA: >=1.03 — AB (ref 1.010–1.025)
Urobilinogen, UA: 0.2 E.U./dL
pH, UA: 5.5 (ref 5.0–8.0)

## 2019-06-21 MED ORDER — CEPHALEXIN 500 MG PO CAPS: 500.0000 mg | ORAL_CAPSULE | Freq: Two times a day (BID) | ORAL | 0 refills | Status: DC

## 2019-06-21 NOTE — Patient Instructions (Addendum)
Drink plenty of fluids  Take cephalexin 500 mg 1 twice daily for 5 days  We will let you know if your urine culture shows anything of concern that we need to do differently with.  Return if problems  If you have lab work done today you will be contacted with your lab results within the next 2 weeks.  If you have not heard from [place] then please contact us. The fastest way to get your results is to register for My Chart.   IF you received an x-ray today, you will receive an invoice from [facility]. Please contact [facility] at [phone] with questions or concerns regarding your invoice.   IF you received labwork today, you will receive an invoice from [place]. Please contact [facility] at [phone] with questions or concerns regarding your invoice.   Our billing staff will not be able to assist you with questions regarding bills from these companies.  You will be contacted with the lab results as soon as they are available. The fastest way to get your results is to activate your My Chart account. Instructions are located on the last page of this paperwork. If you have not heard from [place] regarding the results in 2 weeks, please contact this office.

## 2019-06-21 NOTE — Progress Notes (Signed)
Patient ID: Tony Price, male    DOB: [birth year]  Age: 78 y.o. MRN: [id number]  Chief Complaint  Patient presents with  . f/u uti    pt f/u to make sure uti is gone.  pt on sulfamethoxazole ds and has finished    Subjective:   Patient had urinary symptoms and was treated at [place] urgent care for a UTI.  He is been okay since finishing the antibiotics 5 days ago, but wanted a recheck.  Current allergies, medications, problem list, past/family and social histories reviewed.  Objective:  BP (!) 147/76 (BP Location: Left Arm, Patient Position: Sitting, Cuff Size: Large)   Pulse 78   Temp 99.1 F (37.3 C) (Oral)   Resp 17   Ht 5\' 5"  (1.651 m)   Wt 171 lb 9.6 oz (77.8 kg)   SpO2 98%   BMI 28.56 kg/m   Urinalysis does have  Assessment & Plan:   Assessment: 1. UTI symptoms   2. Acute cystitis without hematuria       Plan: Urine culture is pending.  There were WBCs in the urine.  Will treat.  See instructions.  Orders Placed This Encounter  Procedures  . Urine Culture    Order Specific Question:   Source    Answer:   urine  . POCT urinalysis dipstick    Meds ordered this encounter  Medications  . cephALEXin (KEFLEX) 500 MG capsule    Sig: Take 1 capsule (500 mg total) by mouth 2 (two) times daily.    Dispense:  10 capsule    Refill:  0         Patient Instructions   Drink plenty of fluids  Take cephalexin 500 mg 1 twice daily for 5 days  We will let you know if your urine culture shows anything of concern that we need to do differently with.  Return if problems  If you have lab work done today you will be contacted with your lab results within the next 2 weeks.  If you have not heard from [place] then please contact us. The fastest way to get your results is to register for My Chart.   IF you received an x-ray today, you will receive an invoice from [facility]. Please contact [facility] Radiology at [phone] with questions or  concerns regarding your invoice.   IF you received labwork today, you will receive an invoice from [place]. Please contact [facility] at [phone] with questions or concerns regarding your invoice.   Our billing staff will not be able to assist you with questions regarding bills from these companies.  You will be contacted with the lab results as soon as they are available. The fastest way to get your results is to activate your My Chart account. Instructions are located on the last page of this paperwork. If you have not heard from [place] regarding the results in 2 weeks, please contact this office.        Return if symptoms worsen or fail to improve.   Ruben Reason, MD 9/3/2020Patient ID: Tony Price, male    DOB: [birth year]  Age: 78 y.o. MRN: [id number]  Chief Complaint  Patient presents with  . f/u uti    pt f/u to make sure uti is gone.  pt on sulfamethoxazole ds and has finished    Subjective:   Last week the patient went to an urgent care on [address] and was treated for urinary tract infection.  He is finished his  course of  sulfamethoxazole/trimethoprim and he wanted to get rechecked.  He denies back pain, fever chills, or dysuria.  Current allergies, medications, problem list, past/family and social histories reviewed.  Objective:  BP (!) 147/76 (BP Location: Left Arm, Patient Position: Sitting, Cuff Size: Large)   Pulse 78   Temp 99.1 F (37.3 C) (Oral)   Resp 17   Ht 5\' 5"  (1.651 m)   Wt 171 lb 9.6 oz (77.8 kg)   SpO2 98%   BMI 28.56 kg/m   No CVA tenderness.  Abdomen soft without mass or tenderness.  His urine however does have a small amount of leukocytes still and nitrite is negative.  Decided to go ahead and treat him for a few more days with a different antibiotic while culture is pending.  Assessment & Plan:   Assessment: 1. UTI symptoms   2. Acute cystitis without hematuria       Plan: -  Orders Placed This Encounter  Procedures  .  Urine Culture    Order Specific Question:   Source    Answer:   urine  . POCT urinalysis dipstick    Meds ordered this encounter  Medications  . cephALEXin (KEFLEX) 500 MG capsule    Sig: Take 1 capsule (500 mg total) by mouth 2 (two) times daily.    Dispense:  10 capsule    Refill:  0         Patient Instructions   Drink plenty of fluids  Take cephalexin 500 mg 1 twice daily for 5 days  We will let you know if your urine culture shows anything of concern that we need to do differently with.  Return if problems  If you have lab work done today you will be contacted with your lab results within the next 2 weeks.  If you have not heard from [place] then please contact us. The fastest way to get your results is to register for My Chart.   IF you received an x-ray today, you will receive an invoice from [facility]. Please contact [facility] Radiology at [phone] with questions or concerns regarding your invoice.   IF you received labwork today, you will receive an invoice from [place]. Please contact [facility] at [phone] with questions or concerns regarding your invoice.   Our billing staff will not be able to assist you with questions regarding bills from these companies.  You will be contacted with the lab results as soon as they are available. The fastest way to get your results is to activate your My Chart account. Instructions are located on the last page of this paperwork. If you have not heard from [place] regarding the results in 2 weeks, please contact this office.        Return if symptoms worsen or fail to improve.   Ruben Reason, MD 06/21/2019

## 2019-06-22 LAB — URINE CULTURE

## 2019-07-17 ENCOUNTER — Encounter: Payer: Self-pay | Admitting: Family Medicine

## 2019-07-17 NOTE — Progress Notes (Unsigned)
Impression: mild stable, background diabetic, retinopathy was detected

## 2019-07-18 ENCOUNTER — Ambulatory Visit (INDEPENDENT_AMBULATORY_CARE_PROVIDER_SITE_OTHER): Payer: Medicare Other | Admitting: Podiatry

## 2019-07-18 ENCOUNTER — Other Ambulatory Visit: Payer: Self-pay

## 2019-07-18 ENCOUNTER — Encounter: Payer: Self-pay | Admitting: Podiatry

## 2019-07-18 DIAGNOSIS — B351 Tinea unguium: Secondary | ICD-10-CM

## 2019-07-18 DIAGNOSIS — M79675 Pain in left toe(s): Secondary | ICD-10-CM

## 2019-07-18 DIAGNOSIS — M79674 Pain in right toe(s): Secondary | ICD-10-CM

## 2019-07-18 NOTE — Patient Instructions (Signed)
Diabetes Mellitus and Foot Care Foot care is an important part of your health, especially when you have diabetes. Diabetes may cause you to have problems because of poor blood flow (circulation) to your feet and legs, which can cause your skin to:  Become thinner and drier.  Break more easily.  Heal more slowly.  Peel and crack. You may also have nerve damage (neuropathy) in your legs and feet, causing decreased feeling in them. This means that you may not notice minor injuries to your feet that could lead to more serious problems. Noticing and addressing any potential problems early is the best way to prevent future foot problems. How to care for your feet Foot hygiene  Wash your feet daily with warm water and mild soap. Do not use hot water. Then, pat your feet and the areas between your toes until they are completely dry. Do not soak your feet as this can dry your skin.  Trim your toenails straight across. Do not dig under them or around the cuticle. File the edges of your nails with an emery board or nail file.  Apply a moisturizing lotion or petroleum jelly to the skin on your feet and to dry, brittle toenails. Use lotion that does not contain alcohol and is unscented. Do not apply lotion between your toes. Shoes and socks  Wear clean socks or stockings every day. Make sure they are not too tight. Do not wear knee-high stockings since they may decrease blood flow to your legs.  Wear shoes that fit properly and have enough cushioning. Always look in your shoes before you put them on to be sure there are no objects inside.  To break in new shoes, wear them for just a few hours a day. This prevents injuries on your feet. Wounds, scrapes, corns, and calluses  Check your feet daily for blisters, cuts, bruises, sores, and redness. If you cannot see the bottom of your feet, use a mirror or ask someone for help.  Do not cut corns or calluses or try to remove them with medicine.  If you  find a minor scrape, cut, or break in the skin on your feet, keep it and the skin around it clean and dry. You may clean these areas with mild soap and water. Do not clean the area with peroxide, alcohol, or iodine.  If you have a wound, scrape, corn, or callus on your foot, look at it several times a day to make sure it is healing and not infected. Check for: ? Redness, swelling, or pain. ? Fluid or blood. ? Warmth. ? Pus or a bad smell. General instructions  Do not cross your legs. This may decrease blood flow to your feet.  Do not use heating pads or hot water bottles on your feet. They may burn your skin. If you have lost feeling in your feet or legs, you may not know this is happening until it is too late.  Protect your feet from hot and cold by wearing shoes, such as at the beach or on hot pavement.  Schedule a complete foot exam at least once a year (annually) or more often if you have foot problems. If you have foot problems, report any cuts, sores, or bruises to your health care provider immediately. Contact a health care provider if:  You have a medical condition that increases your risk of infection and you have any cuts, sores, or bruises on your feet.  You have an injury that is not   healing.  You have redness on your legs or feet.  You feel burning or tingling in your legs or feet.  You have pain or cramps in your legs and feet.  Your legs or feet are numb.  Your feet always feel cold.  You have pain around a toenail. Get help right away if:  You have a wound, scrape, corn, or callus on your foot and: ? You have pain, swelling, or redness that gets worse. ? You have fluid or blood coming from the wound, scrape, corn, or callus. ? Your wound, scrape, corn, or callus feels warm to the touch. ? You have pus or a bad smell coming from the wound, scrape, corn, or callus. ? You have a fever. ? You have a red line going up your leg. Summary  Check your feet every day  for cuts, sores, red spots, swelling, and blisters.  Moisturize feet and legs daily.  Wear shoes that fit properly and have enough cushioning.  If you have foot problems, report any cuts, sores, or bruises to your health care provider immediately.  Schedule a complete foot exam at least once a year (annually) or more often if you have foot problems. This information is not intended to replace advice given to you by your health care provider. Make sure you discuss any questions you have with your health care provider. Document Released: 10/01/2000 Document Revised: 11/16/2017 Document Reviewed: 11/05/2016 Elsevier Patient Education  2020 [employer]   Onychomycosis/Fungal Toenails  WHAT IS IT? An infection that lies within the keratin of your nail plate that is caused by a fungus.  WHY ME? Fungal infections affect all ages, sexes, races, and creeds.  There may be many factors that predispose you to a fungal infection such as age, coexisting medical conditions such as diabetes, or an autoimmune disease; stress, medications, fatigue, genetics, etc.  Bottom line: fungus thrives in a warm, moist environment and your shoes offer such a location.  IS IT CONTAGIOUS? Theoretically, yes.  You do not want to share shoes, nail clippers or files with someone who has fungal toenails.  Walking around barefoot in the same room or sleeping in the same bed is unlikely to transfer the organism.  It is important to realize, however, that fungus can spread easily from one nail to the next on the same foot.  HOW DO WE TREAT THIS?  There are several ways to treat this condition.  Treatment may depend on many factors such as age, medications, pregnancy, liver and kidney conditions, etc.  It is best to ask your doctor which options are available to you.  1. No treatment.   Unlike many other medical concerns, you can live with this condition.  However for many people this can be a painful condition and may lead to  ingrown toenails or a bacterial infection.  It is recommended that you keep the nails cut short to help reduce the amount of fungal nail. 2. Topical treatment.  These range from herbal remedies to prescription strength nail lacquers.  About 40-50% effective, topicals require twice daily application for approximately 9 to 12 months or until an entirely new nail has grown out.  The most effective topicals are medical grade medications available through physicians offices. 3. Oral antifungal medications.  With an 80-90% cure rate, the most common oral medication requires 3 to 4 months of therapy and stays in your system for a year as the new nail grows out.  Oral antifungal medications do require   blood work to make sure it is a safe drug for you.  A liver function panel will be performed prior to starting the medication and after the first month of treatment.  It is important to have the blood work performed to avoid any harmful side effects.  In general, this medication safe but blood work is required. 4. Laser Therapy.  This treatment is performed by applying a specialized laser to the affected nail plate.  This therapy is noninvasive, fast, and non-painful.  It is not covered by insurance and is therefore, out of pocket.  The results have been very good with a 80-95% cure rate.  [facility] is the only practice in the area to offer this therapy. 5. Permanent Nail Avulsion.  Removing the entire nail so that a new nail will not grow back. 

## 2019-07-19 NOTE — Progress Notes (Signed)
Subjective:  Tony Price presents to clinic today with cc of  painful, thick, discolored, elongated toenails 1-5 b/l that become tender and cannot cut because of thickness. Pain is aggravated when wearing enclosed shoe gear.  He is requesting diabetic shoes on today's visit.  [name], MD is his PCP.   Current Outpatient Medications on File Prior to Visit  Medication Sig Dispense Refill  . amLODipine (NORVASC) 10 MG tablet Take 1 tablet (10 mg total) by mouth daily. 90 tablet 1  . aspirin 81 MG chewable tablet Chew by mouth daily.    [name] atorvastatin (LIPITOR) 20 MG tablet Take 1 tablet (20 mg total) by mouth daily. 90 tablet 1  . blood glucose meter kit and supplies KIT 1 touch ultra meter.   Check blood sugar once daily E11.9 1 each 0  . Blood Glucose Monitoring Suppl (ONE TOUCH ULTRA SYSTEM KIT) w/Device KIT Test blood sugar once daily. Dx: E11.9 1 each 0  . cephALEXin (KEFLEX) 500 MG capsule Take 1 capsule (500 mg total) by mouth 2 (two) times daily. 10 capsule 0  . glimepiride (AMARYL) 2 MG tablet Take 1 tablet (2 mg total) by mouth 2 (two) times daily. 180 tablet 1  . glucose blood test strip Test blood sugar once daily. Dx: E11.9 100 each 3  . ipratropium (ATROVENT) 0.06 % nasal spray Place 1-2 sprays into both nostrils 3 (three) times daily. As needed for nasal congestion 15 mL 5  . Lancets (ONETOUCH ULTRASOFT) lancets Test blood sugar once daily. Dx: E11.9 100 each 3  . latanoprost (XALATAN) 0.005 % ophthalmic solution Place 1 drop into both eyes at bedtime.    . quinapril (ACCUPRIL) 20 MG tablet Take 1 tablet (20 mg total) by mouth daily. 90 tablet 1   No current facility-administered medications on file prior to visit.      No Known Allergies   Objective: Physical Examination:  Vascular Examination: Capillary refill time immediate x 10 digits.  Palpable DP/PT pulses b/l.  Digital hair absent b/l.  No edema noted b/l.  Skin temperature gradient WNL  b/l.  Dermatological Examination: Skin with normal turgor, texture and tone b/l.  No open wounds b/l.  No interdigital macerations noted b/l.  Elongated, thick, discolored brittle toenails with subungual debris and pain on dorsal palpation of nailbeds 1-5 b/l. Incurvated nailplate b/l great toes with tenderness to palpation. No erythema, no edema, no drainage noted.  Musculoskeletal Examination: Muscle strength 5/5 to all muscle groups b/l.  HAV with bunion deformity b/l.  No pain, crepitus or joint discomfort with active/passive ROM.  Neurological Examination: Sensation intact 5/5 b/l with 10 gram monofilament.  Vibratory sensation diminished b/l.  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 Toenails 1-5 b/l were debrided in length and girth without iatrogenic laceration. Offending nail borders debrided and curretaged b/l great toes. Borders cleansed with alcohol. Antibiotic ointment applied. No further treatment required by patient. 2.  Continue soft, supportive shoe gear daily.Per Medicare guidelines, patient's feet need to be evaluated by an MD/DO managing patient's diabetes and diabetic shoe certification form needs to be signed by the MD/DO. If patient's diabetes is being managed by an Endocrinologist, the Endocrinologist must evaluate patient's feet and sign the Medicare diabetic shoe certification form. 3.  Report any pedal injuries to medical professional. 4.  Follow up 3 months. 5.  Patient/POA to call should there be a  question/concern in there interim.

## 2019-07-26 DIAGNOSIS — Z03818 Encounter for observation for suspected exposure to other biological agents ruled out: Secondary | ICD-10-CM | POA: Diagnosis not present

## 2019-08-02 ENCOUNTER — Ambulatory Visit: Payer: Medicare Other | Admitting: Orthotics

## 2019-08-02 ENCOUNTER — Other Ambulatory Visit: Payer: Self-pay

## 2019-08-02 DIAGNOSIS — B351 Tinea unguium: Secondary | ICD-10-CM

## 2019-08-02 DIAGNOSIS — M79672 Pain in left foot: Secondary | ICD-10-CM

## 2019-08-02 DIAGNOSIS — E1142 Type 2 diabetes mellitus with diabetic polyneuropathy: Secondary | ICD-10-CM

## 2019-08-02 DIAGNOSIS — M79671 Pain in right foot: Secondary | ICD-10-CM

## 2019-08-02 DIAGNOSIS — M21969 Unspecified acquired deformity of unspecified lower leg: Secondary | ICD-10-CM

## 2019-08-02 DIAGNOSIS — M2011 Hallux valgus (acquired), right foot: Secondary | ICD-10-CM

## 2019-08-02 DIAGNOSIS — M79674 Pain in right toe(s): Secondary | ICD-10-CM

## 2019-08-02 NOTE — Progress Notes (Signed)
Docs only..Shoes to come from [place], Dr. Gibson Ramp refresh size 11.  Richy to fab diabetic f/o.

## 2019-08-14 ENCOUNTER — Encounter (INDEPENDENT_AMBULATORY_CARE_PROVIDER_SITE_OTHER): Payer: Medicare Other | Admitting: Ophthalmology

## 2019-08-14 ENCOUNTER — Other Ambulatory Visit: Payer: Self-pay

## 2019-08-14 DIAGNOSIS — H35033 Hypertensive retinopathy, bilateral: Secondary | ICD-10-CM | POA: Diagnosis not present

## 2019-08-14 DIAGNOSIS — H43813 Vitreous degeneration, bilateral: Secondary | ICD-10-CM | POA: Diagnosis not present

## 2019-08-14 DIAGNOSIS — H35341 Macular cyst, hole, or pseudohole, right eye: Secondary | ICD-10-CM | POA: Diagnosis not present

## 2019-08-14 DIAGNOSIS — I10 Essential (primary) hypertension: Secondary | ICD-10-CM | POA: Diagnosis not present

## 2019-08-16 ENCOUNTER — Ambulatory Visit (INDEPENDENT_AMBULATORY_CARE_PROVIDER_SITE_OTHER): Payer: Medicare Other | Admitting: Family Medicine

## 2019-08-16 ENCOUNTER — Encounter: Payer: Self-pay | Admitting: Family Medicine

## 2019-08-16 ENCOUNTER — Other Ambulatory Visit: Payer: Self-pay

## 2019-08-16 VITALS — BP 135/71 | HR 69 | Temp 99.5°F | Wt 171.8 lb

## 2019-08-16 DIAGNOSIS — E11319 Type 2 diabetes mellitus with unspecified diabetic retinopathy without macular edema: Secondary | ICD-10-CM

## 2019-08-16 NOTE — Patient Instructions (Addendum)
  Keep up the good work with walking. No med changes today. Labs at visit in 3 months.    If you have lab work done today you will be contacted with your lab results within the next 2 weeks.  If you have not heard from [place] then please contact us. The fastest way to get your results is to register for My Chart.   IF you received an x-ray today, you will receive an invoice from [facility]. Please contact [facility] at [phone] with questions or concerns regarding your invoice.   IF you received labwork today, you will receive an invoice from [place]. Please contact [facility] at [phone] with questions or concerns regarding your invoice.   Our billing staff will not be able to assist you with questions regarding bills from these companies.  You will be contacted with the lab results as soon as they are available. The fastest way to get your results is to activate your My Chart account. Instructions are located on the last page of this paperwork. If you have not heard from [place] regarding the results in 2 weeks, please contact this office.

## 2019-08-16 NOTE — Progress Notes (Signed)
Subjective:    Patient ID: Tony Price, male    DOB: 01/27/1941, 78 y.o.   MRN: [id number]  HPI Tony Price is a 78 y.o. male Presents today for: Chief Complaint  Patient presents with   Diabetes    3 month f/u on diabetes   Diabetes: Complicated by retinopathy Glimepiride 2 mg twice daily.  Stable in July with weight loss, increased walking at that time.  Walking most days per week - up to 5-6 miles each time.  On ACE inhibitor and statin Microalbumin: Normal ratio 11/14/2018 Optho, foot exam, pneumovax: Has declined Pneumovax, otherwise up-to-date Home readings 117-135, no symptomatic lows.   Diabetic Foot Exam - Simple   Simple Foot Form Diabetic Foot exam was performed with the following findings: Yes 08/16/2019  8:22 AM  Visual Inspection Sensation Testing Pulse Check Comments    Lab Results  Component Value Date   HGBA1C 7.1 (H) 05/16/2019   HGBA1C 7.0 (H) 11/14/2018   HGBA1C 7.2 (H) 08/14/2018   Lab Results  Component Value Date   MICROALBUR 1.1 08/04/2015   LDLCALC 85 05/16/2019   CREATININE 1.20 05/16/2019   Wt Readings from Last 3 Encounters:  08/16/19 171 lb 12.8 oz (77.9 kg)  06/21/19 171 lb 9.6 oz (77.8 kg)  06/14/19 173 lb (78.5 kg)    Patient Active Problem List   Diagnosis Date Noted   Chest pain    Cough 10/21/2017   Acute upper respiratory infection 10/21/2017   Diabetic retinopathy ([place]) 03/31/2016   Onychomycosis 02/25/2016   UTI (lower urinary tract infection) 11/17/2014   DM (diabetes mellitus), type 2 ([place]) 11/17/2014   HTN (hypertension) 11/17/2014   Sepsis ([place]) 11/17/2014   Past Medical History:  Diagnosis Date   Chest pain    Diabetes mellitus    Hypercholesterolemia    Hypertension    Kidney stones    Past Surgical History:  Procedure Laterality Date   CATARACT EXTRACTION     EYE SURGERY     PROSTATE BIOPSY     ROTATOR CUFF REPAIR     Right   No Known Allergies Prior to Admission  medications   Medication Sig Start Date End Date Taking? Authorizing Provider  amLODipine (NORVASC) 10 MG tablet Take 1 tablet (10 mg total) by mouth daily. 05/16/19   [name], MD  aspirin 81 MG chewable tablet Chew by mouth daily.    [provider]  atorvastatin (LIPITOR) 20 MG tablet Take 1 tablet (20 mg total) by mouth daily. 05/16/19   [name], MD  blood glucose meter kit and supplies KIT 1 touch ultra meter.   Check blood sugar once daily E11.9 07/14/17   [name], MD  Blood Glucose Monitoring Suppl (ONE TOUCH ULTRA SYSTEM KIT) w/Device KIT Test blood sugar once daily. Dx: E11.9 07/01/16   [name], MD  cephALEXin (KEFLEX) 500 MG capsule Take 1 capsule (500 mg total) by mouth 2 (two) times daily. 06/21/19   Posey Boyer, MD  glimepiride (AMARYL) 2 MG tablet Take 1 tablet (2 mg total) by mouth 2 (two) times daily. 05/16/19   [name], MD  glucose blood test strip Test blood sugar once daily. Dx: E11.9 07/04/17   [name], MD  ipratropium (ATROVENT) 0.06 % nasal spray Place 1-2 sprays into both nostrils 3 (three) times daily. As needed for nasal congestion 03/15/19   [name], MD  Lancets [facility] ULTRASOFT) lancets Test blood sugar once daily.  Dx: E11.9 07/04/17   [name], MD  latanoprost (XALATAN) 0.005 % ophthalmic solution Place 1 drop into both eyes at bedtime.    [provider]  quinapril (ACCUPRIL) 20 MG tablet Take 1 tablet (20 mg total) by mouth daily. 05/16/19   [name], MD   Social History   Socioeconomic History   Marital status: Divorced    Spouse name: Not on file   Number of children: 3   Years of education: Not on file   Highest education level: Not on file  Occupational History   Not on file  Social Needs   Financial resource strain: Not on file   Food insecurity    Worry: Not on file    Inability: Not on file   Transportation needs    Medical: Not on file     Non-medical: Not on file  Tobacco Use   Smoking status: Former Smoker    Years: 2.00   Smokeless tobacco: Never Used  Substance and Sexual Activity   Alcohol use: No    Alcohol/week: 0.0 standard drinks   Drug use: No   Sexual activity: Not on file  Lifestyle   Physical activity    Days per week: Not on file    Minutes per session: Not on file   Stress: Not on file  Relationships   Social connections    Talks on phone: Not on file    Gets together: Not on file    Attends religious service: Not on file    Active member of club or organization: Not on file    Attends meetings of clubs or organizations: Not on file    Relationship status: Not on file   Intimate partner violence    Fear of current or ex partner: Not on file    Emotionally abused: Not on file    Physically abused: Not on file    Forced sexual activity: Not on file  Other Topics Concern   Not on file  Social History Narrative   Not on file    Review of Systems  Constitutional: Negative for fatigue and unexpected weight change.  Eyes: Negative for visual disturbance.  Respiratory: Negative for cough, chest tightness and shortness of breath.   Cardiovascular: Negative for chest pain, palpitations and leg swelling.  Gastrointestinal: Negative for abdominal pain and blood in stool.  Neurological: Negative for dizziness, light-headedness and headaches.       Objective:   Physical Exam Vitals signs reviewed.  Constitutional:      Appearance: He is well-developed.  HENT:     Head: Normocephalic and atraumatic.  Eyes:     Pupils: Pupils are equal, round, and reactive to light.  Neck:     Vascular: No carotid bruit or JVD.  Cardiovascular:     Rate and Rhythm: Normal rate and regular rhythm.     Heart sounds: Normal heart sounds. No murmur.  Pulmonary:     Effort: Pulmonary effort is normal.     Breath sounds: Normal breath sounds. No rales.  Skin:    General: Skin is warm and dry.    Neurological:     Mental Status: He is alert and oriented to person, place, and time.    Vitals:   08/16/19 0817  BP: 135/71  Pulse: 69  Temp: 99.5 F (37.5 C)  TempSrc: Oral  SpO2: 98%  Weight: 171 lb 12.8 oz (77.9 kg)        Assessment & Plan:  Tony Price is a 78 y.o. male Type 2 diabetes mellitus with retinopathy, without long-term current use of insulin, macular edema presence unspecified, unspecified laterality, unspecified retinopathy severity ([place]) - Plan: [facility] DIABETES FOOT EXAM  - controlled by last A1c given age. Stable home readings. BP ok at this time.   - recheck 3 months with labs at that time.   No orders of the defined types were placed in this encounter.  Patient Instructions    Keep up the good work with walking. No med changes today. Labs at visit in 3 months.    If you have lab work done today you will be contacted with your lab results within the next 2 weeks.  If you have not heard from [place] then please contact us. The fastest way to get your results is to register for My Chart.   IF you received an x-ray today, you will receive an invoice from [facility]. Please contact [facility] at [phone] with questions or concerns regarding your invoice.   IF you received labwork today, you will receive an invoice from [place]. Please contact [facility] at [phone] with questions or concerns regarding your invoice.   Our billing staff will not be able to assist you with questions regarding bills from these companies.  You will be contacted with the lab results as soon as they are available. The fastest way to get your results is to activate your My Chart account. Instructions are located on the last page of this paperwork. If you have not heard from [place] regarding the results in 2 weeks, please contact this office.      Signed,   [name], MD [facility].  08/16/19 8:41 AM

## 2019-08-29 DIAGNOSIS — M898X8 Other specified disorders of bone, other site: Secondary | ICD-10-CM | POA: Diagnosis not present

## 2019-09-10 DIAGNOSIS — M5136 Other intervertebral disc degeneration, lumbar region: Secondary | ICD-10-CM | POA: Diagnosis not present

## 2019-09-11 ENCOUNTER — Telehealth: Payer: Self-pay | Admitting: Podiatry

## 2019-09-11 NOTE — Telephone Encounter (Signed)
Pt left message checking on status of orthotics.   Returned call and left message the diabetic inserts are in production and should be here probably next week and I would call when they arrive.

## 2019-09-19 DIAGNOSIS — Z03818 Encounter for observation for suspected exposure to other biological agents ruled out: Secondary | ICD-10-CM | POA: Diagnosis not present

## 2019-09-25 DIAGNOSIS — Z20828 Contact with and (suspected) exposure to other viral communicable diseases: Secondary | ICD-10-CM | POA: Diagnosis not present

## 2019-10-24 ENCOUNTER — Ambulatory Visit (INDEPENDENT_AMBULATORY_CARE_PROVIDER_SITE_OTHER): Payer: Medicare Other | Admitting: Podiatry

## 2019-10-24 ENCOUNTER — Other Ambulatory Visit: Payer: Self-pay

## 2019-10-24 ENCOUNTER — Encounter: Payer: Self-pay | Admitting: Podiatry

## 2019-10-24 DIAGNOSIS — M79674 Pain in right toe(s): Secondary | ICD-10-CM

## 2019-10-24 DIAGNOSIS — E1142 Type 2 diabetes mellitus with diabetic polyneuropathy: Secondary | ICD-10-CM

## 2019-10-24 DIAGNOSIS — M2011 Hallux valgus (acquired), right foot: Secondary | ICD-10-CM

## 2019-10-24 DIAGNOSIS — M79675 Pain in left toe(s): Secondary | ICD-10-CM

## 2019-10-24 DIAGNOSIS — M2012 Hallux valgus (acquired), left foot: Secondary | ICD-10-CM

## 2019-10-24 DIAGNOSIS — B351 Tinea unguium: Secondary | ICD-10-CM

## 2019-10-24 NOTE — Patient Instructions (Signed)
Diabetes Mellitus and Foot Care Foot care is an important part of your health, especially when you have diabetes. Diabetes may cause you to have problems because of poor blood flow (circulation) to your feet and legs, which can cause your skin to:  Become thinner and drier.  Break more easily.  Heal more slowly.  Peel and crack. You may also have nerve damage (neuropathy) in your legs and feet, causing decreased feeling in them. This means that you may not notice minor injuries to your feet that could lead to more serious problems. Noticing and addressing any potential problems early is the best way to prevent future foot problems. How to care for your feet Foot hygiene  Wash your feet daily with warm water and mild soap. Do not use hot water. Then, pat your feet and the areas between your toes until they are completely dry. Do not soak your feet as this can dry your skin.  Trim your toenails straight across. Do not dig under them or around the cuticle. File the edges of your nails with an emery board or nail file.  Apply a moisturizing lotion or petroleum jelly to the skin on your feet and to dry, brittle toenails. Use lotion that does not contain alcohol and is unscented. Do not apply lotion between your toes. Shoes and socks  Wear clean socks or stockings every day. Make sure they are not too tight. Do not wear knee-high stockings since they may decrease blood flow to your legs.  Wear shoes that fit properly and have enough cushioning. Always look in your shoes before you put them on to be sure there are no objects inside.  To break in new shoes, wear them for just a few hours a day. This prevents injuries on your feet. Wounds, scrapes, corns, and calluses  Check your feet daily for blisters, cuts, bruises, sores, and redness. If you cannot see the bottom of your feet, use a mirror or ask someone for help.  Do not cut corns or calluses or try to remove them with medicine.  If you  find a minor scrape, cut, or break in the skin on your feet, keep it and the skin around it clean and dry. You may clean these areas with mild soap and water. Do not clean the area with peroxide, alcohol, or iodine.  If you have a wound, scrape, corn, or callus on your foot, look at it several times a day to make sure it is healing and not infected. Check for: ? Redness, swelling, or pain. ? Fluid or blood. ? Warmth. ? Pus or a bad smell. General instructions  Do not cross your legs. This may decrease blood flow to your feet.  Do not use heating pads or hot water bottles on your feet. They may burn your skin. If you have lost feeling in your feet or legs, you may not know this is happening until it is too late.  Protect your feet from hot and cold by wearing shoes, such as at the beach or on hot pavement.  Schedule a complete foot exam at least once a year (annually) or more often if you have foot problems. If you have foot problems, report any cuts, sores, or bruises to your health care provider immediately. Contact a health care provider if:  You have a medical condition that increases your risk of infection and you have any cuts, sores, or bruises on your feet.  You have an injury that is not   healing.  You have redness on your legs or feet.  You feel burning or tingling in your legs or feet.  You have pain or cramps in your legs and feet.  Your legs or feet are numb.  Your feet always feel cold.  You have pain around a toenail. Get help right away if:  You have a wound, scrape, corn, or callus on your foot and: ? You have pain, swelling, or redness that gets worse. ? You have fluid or blood coming from the wound, scrape, corn, or callus. ? Your wound, scrape, corn, or callus feels warm to the touch. ? You have pus or a bad smell coming from the wound, scrape, corn, or callus. ? You have a fever. ? You have a red line going up your leg. Summary  Check your feet every day  for cuts, sores, red spots, swelling, and blisters.  Moisturize feet and legs daily.  Wear shoes that fit properly and have enough cushioning.  If you have foot problems, report any cuts, sores, or bruises to your health care provider immediately.  Schedule a complete foot exam at least once a year (annually) or more often if you have foot problems. This information is not intended to replace advice given to you by your health care provider. Make sure you discuss any questions you have with your health care provider. Document Revised: 06/27/2019 Document Reviewed: 11/05/2016 Elsevier Patient Education  2020 [employer]  

## 2019-10-28 NOTE — Progress Notes (Signed)
Subjective: Tony Price presents with diabetes, diabetic neuropathy and cc of painful, discolored, thick toenails which interfere with activities of daily living. Pain is aggravated when wearing enclosed shoe gear. Pain is relieved with periodic professional debridement.  [name], MD is his PCP. Last visit was 08/16/2019.  He is also here to pick up his diabetic shoes on today's visit.  Medications reviewed in chart.  No Known Allergies  Objective: There were no vitals filed for this visit.  Vascular Examination: Capillary refill time palpable b/l.  Dorsalis pedis and posterior tibial pulses palpable b/l.  Digital hair absent b/l.  Skin temperature gradient WNL b/l.  Dermatological Examination: Skin with normal turgor, texture and tone b/l.  Toenails 1-5 b/l discolored, thick, dystrophic with subungual debris and pain with palpation to nailbeds due to thickness of nails.  Musculoskeletal: Muscle strength 5/5 to all LE muscle groups b/l.  HAV with bunion b/l.   No pain, crepitus or joint limitation with passive/active ROM.  Neurological: Sensation intact 5/5 with 10 gram monofilament bilaterally.  Vibratory sensation diminished bilaterally.  Assessment: 1. Painful onychomycosis toenails 1-5 b/l 2. Hallux valgus with bunion b/l 3. NIDDM with Diabetic neuropathy  Plan: 1. Continue diabetic foot care principles. Literature dispensed on today. 2. Toenails 1-5 b/l were debrided in length and girth without iatrogenic bleeding.  3. Dispensed one pair diabetic shoes and 3 pair total contact insoles. Shoes were appropriate fit with no heel slippage. Reviewed warranty information and patient signed all paperwork stating patient received shoes, insert(s)/filler(s), break-in instructions and warranty information. Patient instructed not to wear shoes outside unless completely satisfied. Patient related understanding. 4. Patient to continue soft, supportive shoe  gear. 5. Patient to report any pedal injuries to medical professional. 6. Follow up 3 months.  7. Patient/POA to call should there be a concern in the interim.

## 2019-10-30 ENCOUNTER — Ambulatory Visit: Payer: Medicare Other | Admitting: Orthotics

## 2019-10-30 ENCOUNTER — Other Ambulatory Visit: Payer: Self-pay

## 2019-10-30 DIAGNOSIS — B351 Tinea unguium: Secondary | ICD-10-CM

## 2019-10-30 DIAGNOSIS — M79674 Pain in right toe(s): Secondary | ICD-10-CM

## 2019-10-30 DIAGNOSIS — M2011 Hallux valgus (acquired), right foot: Secondary | ICD-10-CM

## 2019-10-30 DIAGNOSIS — E1142 Type 2 diabetes mellitus with diabetic polyneuropathy: Secondary | ICD-10-CM

## 2019-10-30 NOTE — Progress Notes (Signed)
Ordered replacement Dr. Gibson Ramp one size larger.

## 2019-11-02 ENCOUNTER — Ambulatory Visit: Payer: Medicare Other | Admitting: Orthotics

## 2019-11-06 ENCOUNTER — Ambulatory Visit: Payer: Medicare Other | Admitting: Orthotics

## 2019-11-06 DIAGNOSIS — R972 Elevated prostate specific antigen [PSA]: Secondary | ICD-10-CM | POA: Diagnosis not present

## 2019-11-12 ENCOUNTER — Ambulatory Visit: Payer: Medicare Other | Admitting: Orthotics

## 2019-11-12 DIAGNOSIS — N400 Enlarged prostate without lower urinary tract symptoms: Secondary | ICD-10-CM | POA: Diagnosis not present

## 2019-11-12 DIAGNOSIS — R972 Elevated prostate specific antigen [PSA]: Secondary | ICD-10-CM | POA: Diagnosis not present

## 2019-11-12 DIAGNOSIS — N5201 Erectile dysfunction due to arterial insufficiency: Secondary | ICD-10-CM | POA: Diagnosis not present

## 2019-11-13 ENCOUNTER — Other Ambulatory Visit: Payer: Self-pay

## 2019-11-13 ENCOUNTER — Ambulatory Visit: Payer: Medicare Other | Admitting: Orthotics

## 2019-11-14 ENCOUNTER — Other Ambulatory Visit: Payer: Self-pay | Admitting: Family Medicine

## 2019-11-14 DIAGNOSIS — E11319 Type 2 diabetes mellitus with unspecified diabetic retinopathy without macular edema: Secondary | ICD-10-CM

## 2019-11-23 ENCOUNTER — Other Ambulatory Visit: Payer: Self-pay

## 2019-11-23 ENCOUNTER — Encounter: Payer: Self-pay | Admitting: Family Medicine

## 2019-11-23 ENCOUNTER — Ambulatory Visit (INDEPENDENT_AMBULATORY_CARE_PROVIDER_SITE_OTHER): Payer: Medicare Other | Admitting: Family Medicine

## 2019-11-23 VITALS — BP 130/72 | HR 66 | Temp 98.3°F | Ht 65.0 in | Wt 173.0 lb

## 2019-11-23 DIAGNOSIS — I10 Essential (primary) hypertension: Secondary | ICD-10-CM

## 2019-11-23 DIAGNOSIS — E785 Hyperlipidemia, unspecified: Secondary | ICD-10-CM | POA: Diagnosis not present

## 2019-11-23 DIAGNOSIS — E11319 Type 2 diabetes mellitus with unspecified diabetic retinopathy without macular edema: Secondary | ICD-10-CM

## 2019-11-23 LAB — LIPID PANEL
Chol/HDL Ratio: 2.8 ratio (ref 0.0–5.0)
Cholesterol, Total: 157 mg/dL (ref 100–199)
HDL: 57 mg/dL (ref >39)
LDL Chol Calc (NIH): 88 mg/dL (ref 0–99)
Triglycerides: 58 mg/dL (ref 0–149)
VLDL Cholesterol Cal: 12 mg/dL (ref 5–40)

## 2019-11-23 LAB — COMPREHENSIVE METABOLIC PANEL
ALT: 8 IU/L (ref 0–44)
AST: 10 IU/L (ref 0–40)
Albumin/Globulin Ratio: 1.3 (ref 1.2–2.2)
Albumin: 3.9 g/dL (ref 3.7–4.7)
Alkaline Phosphatase: 101 IU/L (ref 39–117)
BUN/Creatinine Ratio: 9 — ABNORMAL LOW (ref 10–24)
BUN: 11 mg/dL (ref 8–27)
Bilirubin Total: 0.3 mg/dL (ref 0.0–1.2)
CO2: 22 mmol/L (ref 20–29)
Calcium: 9.2 mg/dL (ref 8.6–10.2)
Chloride: 105 mmol/L (ref 96–106)
Creatinine, Ser: 1.17 mg/dL (ref 0.76–1.27)
GFR calc Af Amer: 69 mL/min/{1.73_m2} (ref >59)
GFR calc non Af Amer: 59 mL/min/{1.73_m2} — ABNORMAL LOW (ref >59)
Globulin, Total: 2.9 g/dL (ref 1.5–4.5)
Glucose: 139 mg/dL — ABNORMAL HIGH (ref 65–99)
Potassium: 4.3 mmol/L (ref 3.5–5.2)
Sodium: 143 mmol/L (ref 134–144)
Total Protein: 6.8 g/dL (ref 6.0–8.5)

## 2019-11-23 LAB — HEMOGLOBIN A1C
Est. average glucose Bld gHb Est-mCnc: 157 mg/dL
Hgb A1c MFr Bld: 7.1 % — ABNORMAL HIGH (ref 4.8–5.6)

## 2019-11-23 MED ORDER — ATORVASTATIN CALCIUM 20 MG PO TABS: 20.0000 mg | ORAL_TABLET | Freq: Every day | ORAL | 1 refills | Status: DC

## 2019-11-23 MED ORDER — QUINAPRIL HCL 20 MG PO TABS: 20.0000 mg | ORAL_TABLET | Freq: Every day | ORAL | 1 refills | Status: DC

## 2019-11-23 MED ORDER — AMLODIPINE BESYLATE 10 MG PO TABS: 10.0000 mg | ORAL_TABLET | Freq: Every day | ORAL | 1 refills | Status: DC

## 2019-11-23 MED ORDER — GLIMEPIRIDE 2 MG PO TABS: 2.0000 mg | ORAL_TABLET | Freq: Two times a day (BID) | ORAL | 1 refills | Status: DC

## 2019-11-23 NOTE — Progress Notes (Signed)
Subjective:  Patient ID: Tony Price, male    DOB: [birth year]  Age: 79 y.o. MRN: [id number]  CC:  Chief Complaint  Patient presents with  . Follow-up    on type 2 diabetes and lab work. pt states no issues with his diabetes since last visit. blood sugar this morning read 140 mg/dl fasting. pt states he check his blood sugar every morning.pt states his current medicaition is working well for him with no side effects.    HPI Tony Price presents for   Diabetes: Complicated by retinopathy. Has appt with optho -Dr. Herbert Deaner in April.  Note reviewed from June 01, 2019, mild stable background diabetic retinopathy but only requires monitoring, no treatment indicated at that time. Glimepiride 2 mg twice daily.  Stable previously with exercise, weight loss.  He is on ACE inhibitor and statin.   Normal microalbumin ratio November 06, 2018.  Home readings: fasting 120-130 usually. No postprandials. No symptomatic lows.  No new side effects with meds.   Optho, foot exam, pneumovax: Up-to-date First Covid vaccine to be received later today.  Lab Results  Component Value Date   HGBA1C 7.1 (H) 05/16/2019   HGBA1C 7.0 (H) 11/14/2018   HGBA1C 7.2 (H) 08/14/2018   Lab Results  Component Value Date   MICROALBUR 1.1 08/04/2015   LDLCALC 85 05/16/2019   CREATININE 1.20 05/16/2019   Hyperlipidemia: Lipitor 20 mg daily.  No new myalgias/side effects Walking for exercise 3-4 days per week. 1-29mles  limited recently by weather. .  Lab Results  Component Value Date   CHOL 163 05/16/2019   HDL 61 05/16/2019   LDLCALC 85 05/16/2019   TRIG 85 05/16/2019   CHOLHDL 2.7 05/16/2019   Lab Results  Component Value Date   ALT 9 05/16/2019   AST 17 05/16/2019   ALKPHOS 95 05/16/2019   BILITOT 0.5 05/16/2019   Hypertension: Amlodipine 10 mg daily, quinapril 20 mg daily he is taking aspirin 81 mg daily. Home readings: 140/72? BP Readings from Last 3 Encounters:  11/23/19 130/72    08/16/19 135/71  06/21/19 (!) 147/76   Lab Results  Component Value Date   CREATININE 1.20 05/16/2019       History Patient Active Problem List   Diagnosis Date Noted  . Chest pain   . Cough 10/21/2017  . Acute upper respiratory infection 10/21/2017  . Diabetic retinopathy ([facility] 03/31/2016  . Onychomycosis 02/25/2016  . UTI (lower urinary tract infection) 11/17/2014  . DM (diabetes mellitus), type 2 ([place] 11/17/2014  . HTN (hypertension) 11/17/2014  . Sepsis ([facility] 11/17/2014   Past Medical History:  Diagnosis Date  . Chest pain   . Diabetes mellitus   . Hypercholesterolemia   . Hypertension   . Kidney stones    Past Surgical History:  Procedure Laterality Date  . CATARACT EXTRACTION    . EYE SURGERY    . PROSTATE BIOPSY    . ROTATOR CUFF REPAIR     Right   No Known Allergies Prior to Admission medications   Medication Sig Start Date End Date Taking? Authorizing Provider  amLODipine (NORVASC) 10 MG tablet Take 1 tablet (10 mg total) by mouth daily. 05/16/19  Yes GWendie Agreste MD  aspirin 81 MG chewable tablet Chew by mouth daily.   Yes [provider]  atorvastatin (LIPITOR) 20 MG tablet Take 1 tablet (20 mg total) by mouth daily. 05/16/19  Yes GWendie Agreste MD  blood glucose meter kit and supplies KIT 1 touch ultra  meter.   Check blood sugar once daily E11.9 07/14/17  Yes [name], MD  Blood Glucose Monitoring Suppl (ONE TOUCH ULTRA SYSTEM KIT) w/Device KIT Test blood sugar once daily. Dx: E11.9 07/01/16  Yes [name], MD  carboxymethylcellulose (REFRESH PLUS) 0.5 % SOLN 1 drop 2 (two) times daily as needed. Into both eyes   Yes [provider]  glimepiride (AMARYL) 2 MG tablet TAKE 1 TABLET BY MOUTH TWICE A DAY 11/14/19  Yes [name], MD  glucose blood test strip Test blood sugar once daily. Dx: E11.9 07/04/17  Yes [name], MD  Lancets [facility] ULTRASOFT) lancets Test blood sugar once daily. Dx: E11.9  07/04/17  Yes [name], MD  latanoprost (XALATAN) 0.005 % ophthalmic solution 1 drop at bedtime. 08/31/19  Yes [provider]  quinapril (ACCUPRIL) 20 MG tablet Take 1 tablet (20 mg total) by mouth daily. 05/16/19  Yes [name], MD   Social History   Socioeconomic History  . Marital status: Divorced    Spouse name: Not on file  . Number of children: 3  . Years of education: Not on file  . Highest education level: Not on file  Occupational History  . Not on file  Tobacco Use  . Smoking status: Former Smoker    Years: 2.00  . Smokeless tobacco: Never Used  Substance and Sexual Activity  . Alcohol use: No    Alcohol/week: 0.0 standard drinks  . Drug use: No  . Sexual activity: Not on file  Other Topics Concern  . Not on file  Social History Narrative  . Not on file   Social Determinants of Health   Financial Resource Strain:   . Difficulty of Paying Living Expenses: Not on file  Food Insecurity:   . Worried About Charity fundraiser in the Last Year: Not on file  . Ran Out of Food in the Last Year: Not on file  Transportation Needs:   . Lack of Transportation (Medical): Not on file  . Lack of Transportation (Non-Medical): Not on file  Physical Activity:   . Days of Exercise per Week: Not on file  . Minutes of Exercise per Session: Not on file  Stress:   . Feeling of Stress : Not on file  Social Connections:   . Frequency of Communication with Friends and Family: Not on file  . Frequency of Social Gatherings with Friends and Family: Not on file  . Attends Religious Services: Not on file  . Active Member of Clubs or Organizations: Not on file  . Attends Archivist Meetings: Not on file  . Marital Status: Not on file  Intimate Partner Violence:   . Fear of Current or Ex-Partner: Not on file  . Emotionally Abused: Not on file  . Physically Abused: Not on file  . Sexually Abused: Not on file    Review of Systems  Constitutional:  Negative for fatigue and unexpected weight change.  Eyes: Negative for visual disturbance.  Respiratory: Negative for cough, chest tightness and shortness of breath.   Cardiovascular: Negative for chest pain, palpitations and leg swelling.  Gastrointestinal: Negative for abdominal pain and blood in stool.  Neurological: Negative for dizziness, light-headedness and headaches.     Objective:   Vitals:   11/23/19 0810 11/23/19 0815  BP: (!) 152/72 130/72  Pulse: 66   Temp: 98.3 F (36.8 C)   TempSrc: Temporal   SpO2: 98%   Weight: 173 lb (  78.5 kg)   Height: '5\' 5"'  (1.651 m)      Physical Exam Vitals reviewed.  Constitutional:      Appearance: He is well-developed.  HENT:     Head: Normocephalic and atraumatic.  Eyes:     Pupils: Pupils are equal, round, and reactive to light.  Neck:     Vascular: No carotid bruit or JVD.  Cardiovascular:     Rate and Rhythm: Normal rate and regular rhythm.     Heart sounds: Normal heart sounds. No murmur.  Pulmonary:     Effort: Pulmonary effort is normal.     Breath sounds: Normal breath sounds. No rales.  Skin:    General: Skin is warm and dry.  Neurological:     Mental Status: He is alert and oriented to person, place, and time.        Assessment & Plan:  Tony Price is a 79 y.o. male . Type 2 diabetes mellitus with retinopathy, without long-term current use of insulin, macular edema presence unspecified, unspecified laterality, unspecified retinopathy severity ([place]) - Plan: Hemoglobin A1c, Comprehensive metabolic panel, Lipid panel, glimepiride (AMARYL) 2 MG tablet  -Borderline control previously.  Recheck A1c, continue same regimen for now, continue exercise.  Essential hypertension - Plan: amLODipine (NORVASC) 10 MG tablet, quinapril (ACCUPRIL) 20 MG tablet  - Stable, tolerating current regimen. Medications refilled. Labs pending as above.   Hyperlipidemia, unspecified hyperlipidemia type - Plan: atorvastatin (LIPITOR)  20 MG tablet  -  Stable, tolerating current regimen. Medications refilled. Labs pending as above.     Meds ordered this encounter  Medications  . glimepiride (AMARYL) 2 MG tablet    Sig: Take 1 tablet (2 mg total) by mouth 2 (two) times daily.    Dispense:  180 tablet    Refill:  1  . amLODipine (NORVASC) 10 MG tablet    Sig: Take 1 tablet (10 mg total) by mouth daily.    Dispense:  90 tablet    Refill:  1  . atorvastatin (LIPITOR) 20 MG tablet    Sig: Take 1 tablet (20 mg total) by mouth daily.    Dispense:  90 tablet    Refill:  1  . quinapril (ACCUPRIL) 20 MG tablet    Sig: Take 1 tablet (20 mg total) by mouth daily.    Dispense:  90 tablet    Refill:  1   Patient Instructions    No medication changes at this time.  I will check lab work today.  Keep up the good work with exercise and walking.   If you have lab work done today you will be contacted with your lab results within the next 2 weeks.  If you have not heard from [place] then please contact us. The fastest way to get your results is to register for My Chart.   IF you received an x-ray today, you will receive an invoice from [facility]. Please contact [facility] at [phone] with questions or concerns regarding your invoice.   IF you received labwork today, you will receive an invoice from [place]. Please contact [facility] at [phone] with questions or concerns regarding your invoice.   Our billing staff will not be able to assist you with questions regarding bills from these companies.  You will be contacted with the lab results as soon as they are available. The fastest way to get your results is to activate your My Chart account. Instructions are located on the last page of this paperwork.  If you have not heard from [place] regarding the results in 2 weeks, please contact this office.         Signed, [name], MD [facility]

## 2019-11-23 NOTE — Patient Instructions (Addendum)
  No medication changes at this time.  I will check lab work today.  Keep up the good work with exercise and walking.   If you have lab work done today you will be contacted with your lab results within the next 2 weeks.  If you have not heard from [place] then please contact us. The fastest way to get your results is to register for My Chart.   IF you received an x-ray today, you will receive an invoice from [facility]. Please contact [facility] at [phone] with questions or concerns regarding your invoice.   IF you received labwork today, you will receive an invoice from [place]. Please contact [facility] at [phone] with questions or concerns regarding your invoice.   Our billing staff will not be able to assist you with questions regarding bills from these companies.  You will be contacted with the lab results as soon as they are available. The fastest way to get your results is to activate your My Chart account. Instructions are located on the last page of this paperwork. If you have not heard from [place] regarding the results in 2 weeks, please contact this office.

## 2020-01-23 ENCOUNTER — Other Ambulatory Visit: Payer: Self-pay

## 2020-01-23 ENCOUNTER — Encounter: Payer: Self-pay | Admitting: Podiatry

## 2020-01-23 ENCOUNTER — Ambulatory Visit (INDEPENDENT_AMBULATORY_CARE_PROVIDER_SITE_OTHER): Payer: Medicare Other | Admitting: Podiatry

## 2020-01-23 VITALS — Temp 97.3°F

## 2020-01-23 DIAGNOSIS — L600 Ingrowing nail: Secondary | ICD-10-CM

## 2020-01-23 DIAGNOSIS — M79674 Pain in right toe(s): Secondary | ICD-10-CM | POA: Diagnosis not present

## 2020-01-23 DIAGNOSIS — M79675 Pain in left toe(s): Secondary | ICD-10-CM

## 2020-01-23 DIAGNOSIS — B351 Tinea unguium: Secondary | ICD-10-CM | POA: Diagnosis not present

## 2020-01-23 NOTE — Patient Instructions (Addendum)
EPSOM SALT FOOT SOAK INSTRUCTIONS  Shopping List:  A. Plain epsom salt (not scented) B. Neosporin Cream/Ointment or Bacitracin Cream/Ointment C. 1-inch fabric band-aids   1.  Place 1/4 cup of epsom salts in 2 quarts of warm tap water. IF YOU ARE DIABETIC, OR HAVE NEUROPATHY, CHECK THE TEMPERATURE OF THE WATER WITH YOUR ELBOW.  2.  Submerge your foot/feet in the solution and soak for 10-15 minutes.      3.  Next, remove your foot or feet from solution, blot dry the affected area.    4.  Apply antibiotic ointment and cover with fabric band-aid .  5.  This soak should be done once a day for 7 days.   6.  Monitor for any signs/symptoms of infection such as redness, swelling, odor, drainage, increased pain, or non-healing of digit.   7.  Please do not hesitate to call the office and speak to a Nurse or Doctor if you have questions.   8.  If you experience fever, chills, nightsweats, nausea or vomiting with worsening of digit, please go to the emergency room.   Ingrown Toenail An ingrown toenail occurs when the corner or sides of a toenail grow into the surrounding skin. This causes discomfort and pain. The big toe is most commonly affected, but any of the toes can be affected. If an ingrown toenail is not treated, it can become infected. What are the causes? This condition may be caused by:  Wearing shoes that are too small or tight.  An injury, such as stubbing your toe or having your toe stepped on.  Improper cutting or care of your toenails.  Having nail or foot abnormalities that were present from birth (congenital abnormalities), such as having a nail that is too big for your toe. What increases the risk? The following factors may make you more likely to develop ingrown toenails:  Age. Nails tend to get thicker with age, so ingrown nails are more common among older people.  Cutting your toenails incorrectly, such as cutting them very short or cutting them unevenly. An  ingrown toenail is more likely to get infected if you have:  Diabetes.  Blood flow (circulation) problems. What are the signs or symptoms? Symptoms of an ingrown toenail may include:  Pain, soreness, or tenderness.  Redness.  Swelling.  Hardening of the skin that surrounds the toenail. Signs that an ingrown toenail may be infected include:  Fluid or pus.  Symptoms that get worse instead of better. How is this diagnosed? An ingrown toenail may be diagnosed based on your medical history, your symptoms, and a physical exam. If you have fluid or blood coming from your toenail, a sample may be collected to test for the specific type of bacteria that is causing the infection. How is this treated? Treatment depends on how severe your ingrown toenail is. You may be able to care for your toenail at home.  If you have an infection, you may be prescribed antibiotic medicines.  If you have fluid or pus draining from your toenail, your health care provider may drain it.  If you have trouble walking, you may be given crutches to use.  If you have a severe or infected ingrown toenail, you may need a procedure to remove part or all of the nail. Follow these instructions at home: Foot care   Do not pick at your toenail or try to remove it yourself.  Soak your foot in warm, soapy water. Do this for 20 minutes,  3 times a day, or as often as told by your health care provider. This helps to keep your toe clean and keep your skin soft.  Wear shoes that fit well and are not too tight. Your health care provider may recommend that you wear open-toed shoes while you heal.  Trim your toenails regularly and carefully. Cut your toenails straight across to prevent injury to the skin at the corners of the toenail. Do not cut your nails in a curved shape.  Keep your feet clean and dry to help prevent infection. Medicines  Take over-the-counter and prescription medicines only as told by your health  care provider.  If you were prescribed an antibiotic, take it as told by your health care provider. Do not stop taking the antibiotic even if you start to feel better. Activity  Return to your normal activities as told by your health care provider. Ask your health care provider what activities are safe for you.  Avoid activities that cause pain. General instructions  If your health care provider told you to use crutches to help you move around, use them as instructed.  Keep all follow-up visits as told by your health care provider. This is important. Contact a health care provider if:  You have more redness, swelling, pain, or other symptoms that do not improve with treatment.  You have fluid, blood, or pus coming from your toenail. Get help right away if:  You have a red streak on your skin that starts at your foot and spreads up your leg.  You have a fever. Summary  An ingrown toenail occurs when the corner or sides of a toenail grow into the surrounding skin. This causes discomfort and pain. The big toe is most commonly affected, but any of the toes can be affected.  If an ingrown toenail is not treated, it can become infected.  Fluid or pus draining from your toenail is a sign of infection. Your health care provider may need to drain it. You may be given antibiotics to treat the infection.  Trimming your toenails regularly and properly can help you prevent an ingrown toenail. This information is not intended to replace advice given to you by your health care provider. Make sure you discuss any questions you have with your health care provider. Document Revised: 01/26/2019 Document Reviewed: 06/22/2017 Elsevier Patient Education  [place].  Diabetes Mellitus and [place] care is an important part of your health, especially when you have diabetes. Diabetes may cause you to have problems because of poor blood flow (circulation) to your feet and legs, which can  cause your skin to:  Become thinner and drier.  Break more easily.  Heal more slowly.  Peel and crack. You may also have nerve damage (neuropathy) in your legs and feet, causing decreased feeling in them. This means that you may not notice minor injuries to your feet that could lead to more serious problems. Noticing and addressing any potential problems early is the best way to prevent future foot problems. How to care for your feet Foot hygiene  Wash your feet daily with warm water and mild soap. Do not use hot water. Then, pat your feet and the areas between your toes until they are completely dry. Do not soak your feet as this can dry your skin.  Trim your toenails straight across. Do not dig under them or around the cuticle. File the edges of your nails with an emery board or nail file.  Apply a moisturizing lotion or petroleum jelly to the skin on your feet and to dry, brittle toenails. Use lotion that does not contain alcohol and is unscented. Do not apply lotion between your toes. Shoes and socks  Wear clean socks or stockings every day. Make sure they are not too tight. Do not wear knee-high stockings since they may decrease blood flow to your legs.  Wear shoes that fit properly and have enough cushioning. Always look in your shoes before you put them on to be sure there are no objects inside.  To break in new shoes, wear them for just a few hours a day. This prevents injuries on your feet. Wounds, scrapes, corns, and calluses  Check your feet daily for blisters, cuts, bruises, sores, and redness. If you cannot see the bottom of your feet, use a mirror or ask someone for help.  Do not cut corns or calluses or try to remove them with medicine.  If you find a minor scrape, cut, or break in the skin on your feet, keep it and the skin around it clean and dry. You may clean these areas with mild soap and water. Do not clean the area with peroxide, alcohol, or iodine.  If you have  a wound, scrape, corn, or callus on your foot, look at it several times a day to make sure it is healing and not infected. Check for: ? Redness, swelling, or pain. ? Fluid or blood. ? Warmth. ? Pus or a bad smell. General instructions  Do not cross your legs. This may decrease blood flow to your feet.  Do not use heating pads or hot water bottles on your feet. They may burn your skin. If you have lost feeling in your feet or legs, you may not know this is happening until it is too late.  Protect your feet from hot and cold by wearing shoes, such as at the beach or on hot pavement.  Schedule a complete foot exam at least once a year (annually) or more often if you have foot problems. If you have foot problems, report any cuts, sores, or bruises to your health care provider immediately. Contact a health care provider if:  You have a medical condition that increases your risk of infection and you have any cuts, sores, or bruises on your feet.  You have an injury that is not healing.  You have redness on your legs or feet.  You feel burning or tingling in your legs or feet.  You have pain or cramps in your legs and feet.  Your legs or feet are numb.  Your feet always feel cold.  You have pain around a toenail. Get help right away if:  You have a wound, scrape, corn, or callus on your foot and: ? You have pain, swelling, or redness that gets worse. ? You have fluid or blood coming from the wound, scrape, corn, or callus. ? Your wound, scrape, corn, or callus feels warm to the touch. ? You have pus or a bad smell coming from the wound, scrape, corn, or callus. ? You have a fever. ? You have a red line going up your leg. Summary  Check your feet every day for cuts, sores, red spots, swelling, and blisters.  Moisturize feet and legs daily.  Wear shoes that fit properly and have enough cushioning.  If you have foot problems, report any cuts, sores, or bruises to your health  care provider immediately.  Schedule a complete  foot exam at least once a year (annually) or more often if you have foot problems. This information is not intended to replace advice given to you by your health care provider. Make sure you discuss any questions you have with your health care provider. Document Revised: 06/27/2019 Document Reviewed: 11/05/2016 Elsevier Patient Education  [place]? An infection that lies within the keratin of your nail plate that is caused by a fungus.  WHY ME? Fungal infections affect all ages, sexes, races, and creeds.  There may be many factors that predispose you to a fungal infection such as age, coexisting medical conditions such as diabetes, or an autoimmune disease; stress, medications, fatigue, genetics, etc.  Bottom line: fungus thrives in a warm, moist environment and your shoes offer such a location.  IS IT CONTAGIOUS? Theoretically, yes.  You do not want to share shoes, nail clippers or files with someone who has fungal toenails.  Walking around barefoot in the same room or sleeping in the same bed is unlikely to transfer the organism.  It is important to realize, however, that fungus can spread easily from one nail to the next on the same foot.  HOW DO WE TREAT THIS?  There are several ways to treat this condition.  Treatment may depend on many factors such as age, medications, pregnancy, liver and kidney conditions, etc.  It is best to ask your doctor which options are available to you.  5. No treatment.   Unlike many other medical concerns, you can live with this condition.  However for many people this can be a painful condition and may lead to ingrown toenails or a bacterial infection.  It is recommended that you keep the nails cut short to help reduce the amount of fungal nail. 6. Topical treatment.  These range from herbal remedies to prescription strength nail lacquers.  About 40-50% effective,  topicals require twice daily application for approximately 9 to 12 months or until an entirely new nail has grown out.  The most effective topicals are medical grade medications available through physicians offices. 7. Oral antifungal medications.  With an 80-90% cure rate, the most common oral medication requires 3 to 4 months of therapy and stays in your system for a year as the new nail grows out.  Oral antifungal medications do require blood work to make sure it is a safe drug for you.  A liver function panel will be performed prior to starting the medication and after the first month of treatment.  It is important to have the blood work performed to avoid any harmful side effects.  In general, this medication safe but blood work is required. 8. Laser Therapy.  This treatment is performed by applying a specialized laser to the affected nail plate.  This therapy is noninvasive, fast, and non-painful.  It is not covered by insurance and is therefore, out of pocket.  The results have been very good with a 80-95% cure rate.  [facility] is the only practice in the area to offer this therapy. 9. Permanent Nail Avulsion.  Removing the entire nail so that a new nail will not grow back.

## 2020-01-28 DIAGNOSIS — D3192 Benign neoplasm of unspecified part of left eye: Secondary | ICD-10-CM | POA: Diagnosis not present

## 2020-01-28 DIAGNOSIS — H402231 Chronic angle-closure glaucoma, bilateral, mild stage: Secondary | ICD-10-CM | POA: Diagnosis not present

## 2020-01-28 DIAGNOSIS — H04123 Dry eye syndrome of bilateral lacrimal glands: Secondary | ICD-10-CM | POA: Diagnosis not present

## 2020-01-28 LAB — HM DIABETES EYE EXAM

## 2020-01-28 NOTE — Progress Notes (Signed)
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 Pain interferes with ambulation. Aggravating factors include wearing enclosed shoe gear. Pain is relieved with periodic professional debridement.  Patient states he feels his right great toe medial border is ingrown. He has been soaking in epsom salt and warm water.   No Known Allergies   Objective: Vitals:   01/23/20 1110  Temp: (!) 97.3 F (36.3 C)    Pt 79 y.o. year old African American male in NAD. AAO x 3.   Vascular Examination:  Capillary refill time to digits immediate b/l. Palpable DP pulses b/l. Palpable PT pulses b/l. Pedal hair absent b/l Skin temperature gradient within normal limits b/l.  Dermatological Examination: Pedal skin with normal turgor, texture and tone bilaterally. No open wounds bilaterally. No interdigital macerations bilaterally. Toenails 1-5 b/l elongated, dystrophic, thickened, crumbly with subungual debris and tenderness to dorsal palpation.   Incurvated nailplate right great toe medial border(s) with tenderness to palpation. No erythema, no edema, no drainage noted.  Musculoskeletal: Normal muscle strength 5/5 to all lower extremity muscle groups bilaterally, no pain crepitus or joint limitation noted with ROM b/l and bunion deformity noted b/l  Neurological: Protective sensation intact 5/5 intact bilaterally with 10g monofilament b/l Vibratory sensation absent b/l  Assessment: No diagnosis found.  Plan: -Continue diabetic foot care principles. Literature dispensed on today.  -Toenails 1-5 b/l were debrided in length and girth with sterile nail nippers and dremel without iatrogenic bleeding.  -Patient to continue soft, supportive shoe gear daily. -Patient to report any pedal injuries to medical professional immediately. -Offending nail border debrided and curretaged R hallux. Border cleansed with  alcohol and triple antibiotic applied.  -Dispensed written instructions for once daily epsom salt soaks for 7 days for ingrown toenail. of R hallux.  -Patient/POA to call should there be question/concern in the interim.  Return in about 9 weeks (around 03/26/2020) for diabetic nail trim.

## 2020-02-21 ENCOUNTER — Encounter: Payer: Self-pay | Admitting: Family Medicine

## 2020-02-21 ENCOUNTER — Other Ambulatory Visit: Payer: Self-pay

## 2020-02-21 ENCOUNTER — Ambulatory Visit (INDEPENDENT_AMBULATORY_CARE_PROVIDER_SITE_OTHER): Payer: Medicare Other | Admitting: Family Medicine

## 2020-02-21 VITALS — BP 134/68 | HR 81 | Temp 98.0°F | Ht 65.0 in | Wt 169.8 lb

## 2020-02-21 DIAGNOSIS — Z86010 Personal history of colonic polyps: Secondary | ICD-10-CM | POA: Diagnosis not present

## 2020-02-21 DIAGNOSIS — I10 Essential (primary) hypertension: Secondary | ICD-10-CM | POA: Diagnosis not present

## 2020-02-21 DIAGNOSIS — E785 Hyperlipidemia, unspecified: Secondary | ICD-10-CM | POA: Diagnosis not present

## 2020-02-21 DIAGNOSIS — Z23 Encounter for immunization: Secondary | ICD-10-CM | POA: Diagnosis not present

## 2020-02-21 DIAGNOSIS — E11319 Type 2 diabetes mellitus with unspecified diabetic retinopathy without macular edema: Secondary | ICD-10-CM | POA: Diagnosis not present

## 2020-02-21 LAB — POCT GLYCOSYLATED HEMOGLOBIN (HGB A1C): Hemoglobin A1C: 6.9 % — AB (ref 4.0–5.6)

## 2020-02-21 MED ORDER — AMLODIPINE BESYLATE 10 MG PO TABS: 10.0000 mg | ORAL_TABLET | Freq: Every day | ORAL | 1 refills | Status: DC

## 2020-02-21 MED ORDER — QUINAPRIL HCL 20 MG PO TABS: 20.0000 mg | ORAL_TABLET | Freq: Every day | ORAL | 1 refills | Status: DC

## 2020-02-21 MED ORDER — ATORVASTATIN CALCIUM 20 MG PO TABS: 20.0000 mg | ORAL_TABLET | Freq: Every day | ORAL | 1 refills | Status: DC

## 2020-02-21 MED ORDER — GLIMEPIRIDE 2 MG PO TABS: 2.0000 mg | ORAL_TABLET | Freq: Two times a day (BID) | ORAL | 1 refills | Status: DC

## 2020-02-21 NOTE — Patient Instructions (Addendum)
  No med changes. recheck in 6 months.   If you have lab work done today you will be contacted with your lab results within the next 2 weeks.  If you have not heard from [place] then please contact us. The fastest way to get your results is to register for My Chart.   IF you received an x-ray today, you will receive an invoice from [facility]. Please contact [facility] at [phone] with questions or concerns regarding your invoice.   IF you received labwork today, you will receive an invoice from [facility]. Please contact [facility] at [phone] with questions or concerns regarding your invoice.   Our billing staff will not be able to assist you with questions regarding bills from these companies.  You will be contacted with the lab results as soon as they are available. The fastest way to get your results is to activate your My Chart account. Instructions are located on the last page of this paperwork. If you have not heard from [place] regarding the results in 2 weeks, please contact this office.

## 2020-02-21 NOTE — Progress Notes (Signed)
Subjective:  Patient ID: Tony Price, male    DOB: 1941/01/29  Age: 79 y.o. MRN: [id number]  CC:  Chief Complaint  Patient presents with  . Medical Management of Chronic Issues    3 month f/u on diabetes and hypertension. Patient glucose this am was 108. Last A1c was 7.1 on 11/23/19    HPI Tony Price presents for   Diabetes: Complicated by retinopathy. On glimepiride 28m BID.  On ACE-I and statin.  Fasting 108 this am.   Microalbumin: nl ratio. In 10/2018.  Optho, foot exam, pneumovax: due for pneumovax.    Immunization History  Administered Date(s) Administered  . PFIZER SARS-COV-2 Vaccination 12/03/2019, 12/29/2019  . Tdap 10/18/2010     Lab Results  Component Value Date   HGBA1C 6.9 (A) 02/21/2020   HGBA1C 7.1 (H) 11/23/2019   HGBA1C 7.1 (H) 05/16/2019   Lab Results  Component Value Date   MICROALBUR 1.1 08/04/2015   LDLCALC 88 11/23/2019   CREATININE 1.17 11/23/2019   Hypertension: norvasc 181m quinapril 2072md.  Home readings:130/60.  No new side effects of meds.   BP Readings from Last 3 Encounters:  02/21/20 134/68  11/23/19 130/72  08/16/19 135/71   Lab Results  Component Value Date   CREATININE 1.17 11/23/2019   Hyperlipidemia: Lipitor 28m71m, no new myalgias/side effects.   Lab Results  Component Value Date   CHOL 157 11/23/2019   HDL 57 11/23/2019   LDLCALC 88 11/23/2019   TRIG 58 11/23/2019   CHOLHDL 2.8 11/23/2019   Lab Results  Component Value Date   ALT 8 11/23/2019   AST 10 11/23/2019   ALKPHOS 101 11/23/2019   BILITOT 0.3 11/23/2019    History Patient Active Problem List   Diagnosis Date Noted  . Chest pain   . Cough 10/21/2017  . Acute upper respiratory infection 10/21/2017  . Diabetic retinopathy (HCC)[place]/14/2017  . Onychomycosis 02/25/2016  . UTI (lower urinary tract infection) 11/17/2014  . DM (diabetes mellitus), type 2 (HCC)[place]/31/2016  . HTN (hypertension) 11/17/2014  . Sepsis (HCC)[place]/31/2016    Past Medical History:  Diagnosis Date  . Chest pain   . Diabetes mellitus   . Hypercholesterolemia   . Hypertension   . Kidney stones    Past Surgical History:  Procedure Laterality Date  . CATARACT EXTRACTION    . EYE SURGERY    . PROSTATE BIOPSY    . ROTATOR CUFF REPAIR     Right   No Known Allergies Prior to Admission medications   Medication Sig Start Date End Date Taking? Authorizing Provider  amLODipine (NORVASC) 10 MG tablet Take 1 tablet (10 mg total) by mouth daily. 11/23/19  Yes GreeWendie Agreste  aspirin 81 MG chewable tablet Chew by mouth daily.   Yes [provider]  atorvastatin (LIPITOR) 20 MG tablet Take 1 tablet (20 mg total) by mouth daily. 11/23/19  Yes GreeWendie Agreste  blood glucose meter kit and supplies KIT 1 touch ultra meter.   Check blood sugar once daily E11.9 07/14/17  Yes GreeWendie Agreste  Blood Glucose Monitoring Suppl (ONE TOUCH ULTRA SYSTEM KIT) w/Device KIT Test blood sugar once daily. Dx: E11.9 07/01/16  Yes GreeWendie Agreste  carboxymethylcellulose (REFRESH PLUS) 0.5 % SOLN 1 drop 2 (two) times daily as needed. Into both eyes   Yes [provider]  glimepiride (AMARYL) 2 MG tablet Take 1 tablet (2 mg total) by mouth 2 (two) times daily.  11/23/19  Yes [name], MD  glucose blood test strip Test blood sugar once daily. Dx: E11.9 07/04/17  Yes [name], MD  Lancets [facility] ULTRASOFT) lancets Test blood sugar once daily. Dx: E11.9 07/04/17  Yes [name], MD  latanoprost (XALATAN) 0.005 % ophthalmic solution 1 drop at bedtime. 08/31/19  Yes [provider]  quinapril (ACCUPRIL) 20 MG tablet Take 1 tablet (20 mg total) by mouth daily. 11/23/19  Yes [name], MD   Social History   Socioeconomic History  . Marital status: Divorced    Spouse name: Not on file  . Number of children: 3  . Years of education: Not on file  . Highest education level: Not on file  Occupational  History  . Not on file  Tobacco Use  . Smoking status: Former Smoker    Years: 2.00  . Smokeless tobacco: Never Used  Substance and Sexual Activity  . Alcohol use: No    Alcohol/week: 0.0 standard drinks  . Drug use: No  . Sexual activity: Not on file  Other Topics Concern  . Not on file  Social History Narrative  . Not on file   Social Determinants of Health   Financial Resource Strain:   . Difficulty of Paying Living Expenses:   Food Insecurity:   . Worried About Charity fundraiser in the Last Year:   . Arboriculturist in the Last Year:   Transportation Needs:   . Film/video editor (Medical):   [name] Lack of Transportation (Non-Medical):   Physical Activity:   . Days of Exercise per Week:   . Minutes of Exercise per Session:   Stress:   . Feeling of Stress :   Social Connections:   . Frequency of Communication with Friends and Family:   . Frequency of Social Gatherings with Friends and Family:   . Attends Religious Services:   . Active Member of Clubs or Organizations:   . Attends Archivist Meetings:   [name] Marital Status:   Intimate Partner Violence:   . Fear of Current or Ex-Partner:   . Emotionally Abused:   [name] Physically Abused:   . Sexually Abused:     Review of Systems  Constitutional: Negative for fatigue and unexpected weight change.  Eyes: Negative for visual disturbance.  Respiratory: Negative for cough, chest tightness and shortness of breath.   Cardiovascular: Negative for chest pain, palpitations and leg swelling.  Gastrointestinal: Negative for abdominal pain and blood in stool.  Neurological: Negative for dizziness, light-headedness and headaches.     Objective:   Vitals:   02/21/20 0841  BP: 134/68  Pulse: 81  Temp: 98 F (36.7 C)  TempSrc: Temporal  SpO2: 97%  Weight: 169 lb 12.8 oz (77 kg)  Height: '5\' 5"'  (1.651 m)     Physical Exam Vitals reviewed.  Constitutional:      Appearance: He is well-developed.  HENT:      Head: Normocephalic and atraumatic.  Eyes:     Pupils: Pupils are equal, round, and reactive to light.  Neck:     Vascular: No carotid bruit or JVD.  Cardiovascular:     Rate and Rhythm: Normal rate and regular rhythm.     Heart sounds: Normal heart sounds. No murmur.  Pulmonary:     Effort: Pulmonary effort is normal.     Breath sounds: Normal breath sounds. No rales.  Skin:    General: Skin is warm and dry.  Neurological:     Mental Status: He is alert and oriented to person, place, and time.     Results for orders placed or performed in visit on 02/21/20  POCT glycosylated hemoglobin (Hb A1C)  Result Value Ref Range   Hemoglobin A1C 6.9 (A) 4.0 - 5.6 %   HbA1c POC (<> result, manual entry)     HbA1c, POC (prediabetic range)     HbA1c, POC (controlled diabetic range)        Assessment & Plan:  Tony Price is a 79 y.o. male . Type 2 diabetes mellitus with retinopathy, without long-term current use of insulin, macular edema presence unspecified, unspecified laterality, unspecified retinopathy severity ([place]) - Plan: Comprehensive metabolic panel, POCT glycosylated hemoglobin (Hb A1C), Microalbumin / creatinine urine ratio, Pneumococcal polysaccharide vaccine 23-valent greater than or equal to 2yo subcutaneous/IM, glimepiride (AMARYL) 2 MG tablet  -  Stable, tolerating current regimen. Medications refilled. Labs pending as above.   Need for prophylactic vaccination against Streptococcus pneumoniae (pneumococcus) - Plan: Pneumococcal polysaccharide vaccine 23-valent greater than or equal to 2yo subcutaneous/IM  Essential hypertension - Plan: Comprehensive metabolic panel, amLODipine (NORVASC) 10 MG tablet, quinapril (ACCUPRIL) 20 MG tablet  - stable. Continue same meds.   Hyperlipidemia, unspecified hyperlipidemia type - Plan: atorvastatin (LIPITOR) 20 MG tablet  -  Stable, tolerating current regimen. Medications refilled. Labs pending as above.    Meds ordered this  encounter  Medications  . amLODipine (NORVASC) 10 MG tablet    Sig: Take 1 tablet (10 mg total) by mouth daily.    Dispense:  90 tablet    Refill:  1  . atorvastatin (LIPITOR) 20 MG tablet    Sig: Take 1 tablet (20 mg total) by mouth daily.    Dispense:  90 tablet    Refill:  1  . glimepiride (AMARYL) 2 MG tablet    Sig: Take 1 tablet (2 mg total) by mouth 2 (two) times daily.    Dispense:  180 tablet    Refill:  1  . quinapril (ACCUPRIL) 20 MG tablet    Sig: Take 1 tablet (20 mg total) by mouth daily.    Dispense:  90 tablet    Refill:  1   Patient Instructions    No med changes. recheck in 6 months.   If you have lab work done today you will be contacted with your lab results within the next 2 weeks.  If you have not heard from [place] then please contact us. The fastest way to get your results is to register for My Chart.   IF you received an x-ray today, you will receive an invoice from [facility] Radiology. Please contact [facility] Radiology at [phone] with questions or concerns regarding your invoice.   IF you received labwork today, you will receive an invoice from [place]. Please contact [facility] at [phone] with questions or concerns regarding your invoice.   Our billing staff will not be able to assist you with questions regarding bills from these companies.  You will be contacted with the lab results as soon as they are available. The fastest way to get your results is to activate your My Chart account. Instructions are located on the last page of this paperwork. If you have not heard from [place] regarding the results in 2 weeks, please contact this office.          Signed, [name], MD [facility]

## 2020-02-22 LAB — COMPREHENSIVE METABOLIC PANEL
ALT: 9 IU/L (ref 0–44)
AST: 19 IU/L (ref 0–40)
Albumin/Globulin Ratio: 1.4 (ref 1.2–2.2)
Albumin: 4.2 g/dL (ref 3.7–4.7)
Alkaline Phosphatase: 96 IU/L (ref 39–117)
BUN/Creatinine Ratio: 12 (ref 10–24)
BUN: 16 mg/dL (ref 8–27)
Bilirubin Total: 0.4 mg/dL (ref 0.0–1.2)
CO2: 22 mmol/L (ref 20–29)
Calcium: 9.1 mg/dL (ref 8.6–10.2)
Chloride: 106 mmol/L (ref 96–106)
Creatinine, Ser: 1.3 mg/dL — ABNORMAL HIGH (ref 0.76–1.27)
GFR calc Af Amer: 60 mL/min/{1.73_m2} (ref >59)
GFR calc non Af Amer: 52 mL/min/{1.73_m2} — ABNORMAL LOW (ref >59)
Globulin, Total: 2.9 g/dL (ref 1.5–4.5)
Glucose: 119 mg/dL — ABNORMAL HIGH (ref 65–99)
Potassium: 4.3 mmol/L (ref 3.5–5.2)
Sodium: 143 mmol/L (ref 134–144)
Total Protein: 7.1 g/dL (ref 6.0–8.5)

## 2020-03-14 DIAGNOSIS — Z1159 Encounter for screening for other viral diseases: Secondary | ICD-10-CM | POA: Diagnosis not present

## 2020-03-19 DIAGNOSIS — K640 First degree hemorrhoids: Secondary | ICD-10-CM | POA: Diagnosis not present

## 2020-03-19 DIAGNOSIS — D123 Benign neoplasm of transverse colon: Secondary | ICD-10-CM | POA: Diagnosis not present

## 2020-03-19 DIAGNOSIS — Z86010 Personal history of colonic polyps: Secondary | ICD-10-CM | POA: Diagnosis not present

## 2020-03-19 LAB — HM COLONOSCOPY

## 2020-03-21 DIAGNOSIS — D123 Benign neoplasm of transverse colon: Secondary | ICD-10-CM | POA: Diagnosis not present

## 2020-04-23 ENCOUNTER — Encounter: Payer: Self-pay | Admitting: Podiatry

## 2020-04-23 ENCOUNTER — Ambulatory Visit (INDEPENDENT_AMBULATORY_CARE_PROVIDER_SITE_OTHER): Payer: Medicare Other | Admitting: Podiatry

## 2020-04-23 ENCOUNTER — Other Ambulatory Visit: Payer: Self-pay

## 2020-04-23 DIAGNOSIS — B351 Tinea unguium: Secondary | ICD-10-CM

## 2020-04-23 DIAGNOSIS — M79675 Pain in left toe(s): Secondary | ICD-10-CM | POA: Diagnosis not present

## 2020-04-23 DIAGNOSIS — E1142 Type 2 diabetes mellitus with diabetic polyneuropathy: Secondary | ICD-10-CM

## 2020-04-23 DIAGNOSIS — M79674 Pain in right toe(s): Secondary | ICD-10-CM

## 2020-04-23 DIAGNOSIS — M2011 Hallux valgus (acquired), right foot: Secondary | ICD-10-CM

## 2020-04-23 DIAGNOSIS — M2012 Hallux valgus (acquired), left foot: Secondary | ICD-10-CM

## 2020-04-27 NOTE — Progress Notes (Signed)
Subjective:  Patient ID: Tony Price, male    DOB: [birth year],  MRN: [id number]  Tony Price presents to clinic today for preventative diabetic foot care and painful thick toenails that are difficult to trim. Pain interferes with ambulation. Aggravating factors include wearing enclosed shoe gear. Pain is relieved with periodic professional debridement..  79 y.o. male presents with the above complaint.  Reports painfully elongated nails to both feet.  Review of Systems: Negative except as noted in the HPI. Past Medical History:  Diagnosis Date  . Chest pain   . Diabetes mellitus   . Hypercholesterolemia   . Hypertension   . Kidney stones    Past Surgical History:  Procedure Laterality Date  . CATARACT EXTRACTION    . EYE SURGERY    . PROSTATE BIOPSY    . ROTATOR CUFF REPAIR     Right    Current Outpatient Medications:  .  amLODipine (NORVASC) 10 MG tablet, Take 1 tablet (10 mg total) by mouth daily., Disp: 90 tablet, Rfl: 1 .  aspirin 81 MG chewable tablet, Chew by mouth daily., Disp: , Rfl:  .  atorvastatin (LIPITOR) 20 MG tablet, Take 1 tablet (20 mg total) by mouth daily., Disp: 90 tablet, Rfl: 1 .  blood glucose meter kit and supplies KIT, 1 touch ultra meter.   Check blood sugar once daily E11.9, Disp: 1 each, Rfl: 0 .  Blood Glucose Monitoring Suppl (ONE TOUCH ULTRA SYSTEM KIT) w/Device KIT, Test blood sugar once daily. Dx: E11.9, Disp: 1 each, Rfl: 0 .  carboxymethylcellulose (REFRESH PLUS) 0.5 % SOLN, 1 drop 2 (two) times daily as needed. Into both eyes, Disp: , Rfl:  .  glimepiride (AMARYL) 2 MG tablet, Take 1 tablet (2 mg total) by mouth 2 (two) times daily., Disp: 180 tablet, Rfl: 1 .  glucose blood test strip, Test blood sugar once daily. Dx: E11.9, Disp: 100 each, Rfl: 3 .  Lancets (ONETOUCH ULTRASOFT) lancets, Test blood sugar once daily. Dx: E11.9, Disp: 100 each, Rfl: 3 .  latanoprost (XALATAN) 0.005 % ophthalmic solution, 1 drop at bedtime., Disp: , Rfl:    .  quinapril (ACCUPRIL) 20 MG tablet, Take 1 tablet (20 mg total) by mouth daily., Disp: 90 tablet, Rfl: 1 No Known Allergies _0 @ Objective:   Constitutional Tony Price is a pleasant 79 y.o. African American male, WD, WN in NAD. AAO x 3.   Vascular Dorsalis pedis pulses palpable bilaterally. Posterior tibial pulses palpable bilaterally. Capillary refill normal to all digits.  No cyanosis or clubbing noted. Pedal hair growth absent b/l LE.  Neurologic Normal speech. Oriented to person, place, and time. Epicritic sensation to light touch grossly present bilaterally. Vibratory sensation absent b/l LE.  Dermatologic Pedal skin with normal turgor, texture and tone b/l.  Nails thick, elongated, dystrophic with pain on palpation x 10 No open wounds. No skin lesions.  Orthopedic: Normal joint ROM without pain or crepitus bilaterally. HAV with bunion deformity b/l LE. No bony tenderness.   Radiographs: None Assessment:   1. Pain due to onychomycosis of toenails of both feet   2. Hallux valgus, acquired, bilateral   3. Diabetic peripheral neuropathy associated with type 2 diabetes mellitus ([place])    Plan:  Patient was evaluated and treated and all questions answered.  Onychomycosis with pain -Nails palliatively debridement as below -Educated on self-care  Procedure: Nail Debridement Rationale: Pain Type of Debridement: manual, sharp debridement. Instrumentation: Nail nipper, rotary burr. Number of Nails: 10 -Examined patient. -Continue diabetic foot  care principles. -Toenails 1-5 b/l were debrided in length and girth with sterile nail nippers and dremel without iatrogenic bleeding.  -Patient to report any pedal injuries to medical professional immediately. -Patient to continue soft, supportive shoe gear daily. -Patient/POA to call should there be question/concern in the interim.  Return in about 3 months (around 07/24/2020) for diabetic nail trim.  Marzetta Board, DPM

## 2020-05-06 DIAGNOSIS — R972 Elevated prostate specific antigen [PSA]: Secondary | ICD-10-CM | POA: Diagnosis not present

## 2020-05-13 DIAGNOSIS — N5201 Erectile dysfunction due to arterial insufficiency: Secondary | ICD-10-CM | POA: Diagnosis not present

## 2020-05-13 DIAGNOSIS — N400 Enlarged prostate without lower urinary tract symptoms: Secondary | ICD-10-CM | POA: Diagnosis not present

## 2020-05-13 DIAGNOSIS — R972 Elevated prostate specific antigen [PSA]: Secondary | ICD-10-CM | POA: Diagnosis not present

## 2020-06-09 DIAGNOSIS — E113293 Type 2 diabetes mellitus with mild nonproliferative diabetic retinopathy without macular edema, bilateral: Secondary | ICD-10-CM | POA: Diagnosis not present

## 2020-06-09 DIAGNOSIS — D492 Neoplasm of unspecified behavior of bone, soft tissue, and skin: Secondary | ICD-10-CM | POA: Diagnosis not present

## 2020-06-09 DIAGNOSIS — H35033 Hypertensive retinopathy, bilateral: Secondary | ICD-10-CM | POA: Diagnosis not present

## 2020-06-09 DIAGNOSIS — H402231 Chronic angle-closure glaucoma, bilateral, mild stage: Secondary | ICD-10-CM | POA: Diagnosis not present

## 2020-06-09 LAB — HM DIABETES EYE EXAM

## 2020-06-26 DIAGNOSIS — H35033 Hypertensive retinopathy, bilateral: Secondary | ICD-10-CM | POA: Diagnosis not present

## 2020-06-26 DIAGNOSIS — D492 Neoplasm of unspecified behavior of bone, soft tissue, and skin: Secondary | ICD-10-CM | POA: Diagnosis not present

## 2020-06-26 DIAGNOSIS — E113293 Type 2 diabetes mellitus with mild nonproliferative diabetic retinopathy without macular edema, bilateral: Secondary | ICD-10-CM | POA: Diagnosis not present

## 2020-06-26 DIAGNOSIS — H402231 Chronic angle-closure glaucoma, bilateral, mild stage: Secondary | ICD-10-CM | POA: Diagnosis not present

## 2020-06-26 LAB — HM DIABETES EYE EXAM

## 2020-07-09 ENCOUNTER — Encounter: Payer: Self-pay | Admitting: Family Medicine

## 2020-07-09 ENCOUNTER — Ambulatory Visit (INDEPENDENT_AMBULATORY_CARE_PROVIDER_SITE_OTHER): Payer: Medicare Other | Admitting: Family Medicine

## 2020-07-09 ENCOUNTER — Other Ambulatory Visit: Payer: Self-pay

## 2020-07-09 VITALS — BP 134/80 | HR 78 | Temp 98.7°F | Ht 65.0 in | Wt 167.0 lb

## 2020-07-09 DIAGNOSIS — L299 Pruritus, unspecified: Secondary | ICD-10-CM

## 2020-07-09 NOTE — Progress Notes (Signed)
Subjective:  Patient ID: Tony Price, male    DOB: 1940/12/18  Age: 79 y.o. MRN: [id number]  CC:  Chief Complaint  Patient presents with  . itching on the back    PT reports some itching on his back it started a week ago. pt reports he hasn't been in the woods and he hasn't had any change in laundry detergent. pt has used Eucerin and that has helped. pt reports no bumps or sighns of a rash that he has noticed.    HPI Tony Price presents for   Itching of back: Started 1 week ago. No new soap/detergent/fabric softener.  No rash known.  No outside work, no rash or itching of arms/legs. No poison ivy exposure known.  No fever, no genital/mouth lesions. Feels well otherwise.  Tx: eucerin lotion - better past few days.  1 shower per day, no new clothing.  Sleeps in same bed with partner, she has not had rash or itching.    History Patient Active Problem List   Diagnosis Date Noted  . Chest pain   . Cough 10/21/2017  . Acute upper respiratory infection 10/21/2017  . Diabetic retinopathy ([place]) 03/31/2016  . Onychomycosis 02/25/2016  . UTI (lower urinary tract infection) 11/17/2014  . DM (diabetes mellitus), type 2 ([place]) 11/17/2014  . HTN (hypertension) 11/17/2014  . Sepsis ([place]) 11/17/2014   Past Medical History:  Diagnosis Date  . Chest pain   . Diabetes mellitus   . Hypercholesterolemia   . Hypertension   . Kidney stones    Past Surgical History:  Procedure Laterality Date  . CATARACT EXTRACTION    . EYE SURGERY    . PROSTATE BIOPSY    . ROTATOR CUFF REPAIR     Right   No Known Allergies Prior to Admission medications   Medication Sig Start Date End Date Taking? Authorizing Provider  amLODipine (NORVASC) 10 MG tablet Take 1 tablet (10 mg total) by mouth daily. 02/21/20  Yes [name], MD  aspirin 81 MG chewable tablet Chew by mouth daily.   Yes [provider]  atorvastatin (LIPITOR) 20 MG tablet Take 1 tablet (20 mg total) by mouth daily.  02/21/20  Yes [name], MD  blood glucose meter kit and supplies KIT 1 touch ultra meter.   Check blood sugar once daily E11.9 07/14/17  Yes [name], MD  Blood Glucose Monitoring Suppl (ONE TOUCH ULTRA SYSTEM KIT) w/Device KIT Test blood sugar once daily. Dx: E11.9 07/01/16  Yes [name], MD  carboxymethylcellulose (REFRESH PLUS) 0.5 % SOLN 1 drop 2 (two) times daily as needed. Into both eyes   Yes [provider]  glimepiride (AMARYL) 2 MG tablet Take 1 tablet (2 mg total) by mouth 2 (two) times daily. 02/21/20  Yes [name], MD  glucose blood test strip Test blood sugar once daily. Dx: E11.9 07/04/17  Yes [name], MD  Lancets [facility] ULTRASOFT) lancets Test blood sugar once daily. Dx: E11.9 07/04/17  Yes [name], MD  latanoprost (XALATAN) 0.005 % ophthalmic solution 1 drop at bedtime. 08/31/19  Yes [provider]  quinapril (ACCUPRIL) 20 MG tablet Take 1 tablet (20 mg total) by mouth daily. 02/21/20  Yes [name], MD   Social History   Socioeconomic History  . Marital status: Divorced    Spouse name: Not on file  . Number of children: 3  . Years of education: Not on file  . Highest education  level: Not on file  Occupational History  . Not on file  Tobacco Use  . Smoking status: Former Smoker    Years: 2.00  . Smokeless tobacco: Never Used  Substance and Sexual Activity  . Alcohol use: No    Alcohol/week: 0.0 standard drinks  . Drug use: No  . Sexual activity: Not on file  Other Topics Concern  . Not on file  Social History Narrative  . Not on file   Social Determinants of Health   Financial Resource Strain:   . Difficulty of Paying Living Expenses: Not on file  Food Insecurity:   . Worried About Charity fundraiser in the Last Year: Not on file  . Ran Out of Food in the Last Year: Not on file  Transportation Needs:   . Lack of Transportation (Medical): Not on file  . Lack of Transportation  (Non-Medical): Not on file  Physical Activity:   . Days of Exercise per Week: Not on file  . Minutes of Exercise per Session: Not on file  Stress:   . Feeling of Stress : Not on file  Social Connections:   . Frequency of Communication with Friends and Family: Not on file  . Frequency of Social Gatherings with Friends and Family: Not on file  . Attends Religious Services: Not on file  . Active Member of Clubs or Organizations: Not on file  . Attends Archivist Meetings: Not on file  . Marital Status: Not on file  Intimate Partner Violence:   . Fear of Current or Ex-Partner: Not on file  . Emotionally Abused: Not on file  . Physically Abused: Not on file  . Sexually Abused: Not on file    Review of Systems  Per hpi.  Objective:   Vitals:   07/09/20 1414 07/09/20 1418  BP: (!) 143/74 134/80  Pulse: 78   Temp: 98.7 F (37.1 C)   TempSrc: Temporal   SpO2: 99%   Weight: 167 lb (75.8 kg)   Height: _0  (1.651 m)      Physical Exam Vitals reviewed.  Constitutional:      General: He is not in acute distress.    Appearance: He is well-developed.  HENT:     Head: Normocephalic and atraumatic.  Cardiovascular:     Rate and Rhythm: Normal rate.  Pulmonary:     Effort: Pulmonary effort is normal. No respiratory distress.     Breath sounds: No stridor. No wheezing.  Skin:    General: Skin is warm and dry.     Findings: No rash.     Comments: Skin of back without apparent excoriation, urticaria, or other lesions.  No erythema.  No rash.  Neurological:     Mental Status: He is alert and oriented to person, place, and time.     Assessment & Plan:  Tony Price is a 79 y.o. male . Pruritus Onset 1 week ago, now improved.  Differential includes contact dermatitis but no other area of involvement.  Dry skin also possible with improvement using Eucerin.  Does use Dove soap, no other dermatologic product changes recently.  Continue Eucerin next week to 10 days,  RTC precautions if symptoms return.   No orders of the defined types were placed in this encounter.  Patient Instructions    I do not see any concerning rash in the office today and because the itching has improved with Eucerin, it is possible that you could have had dry skin as the  cause.  Okay to continue Eucerin once or twice per day for the next week or so then if itching returns, follow-up and we can look at other possibilities.   Pruritus Pruritus is an itchy feeling on the skin. One of the most common causes is dry skin, but many different things can cause itching. Most cases of itching do not require medical attention. Sometimes itchy skin can turn into a rash. Follow these instructions at home: Skin care   Apply moisturizing lotion to your skin as needed. Lotion that contains petroleum jelly is best.  Take medicines or apply medicated creams only as told by your health care provider. This may include: ? Corticosteroid cream. ? Anti-itch lotions. ? Oral antihistamines.  Apply a cool, wet cloth (cool compress) to the affected areas.  Take baths with one of the following: ? Epsom salts. You can get these at your local pharmacy or grocery store. Follow the instructions on the packaging. ? Baking soda. Pour a small amount into the bath as told by your health care provider. ? Colloidal oatmeal. You can get this at your local pharmacy or grocery store. Follow the instructions on the packaging.  Apply baking soda paste to your skin. To make the paste, stir water into a small amount of baking soda until it reaches a paste-like consistency.  Do not scratch your skin.  Do not take hot showers or baths, which can make itching worse. A cool shower may help with itching as long as you apply moisturizing lotion after the shower.  Do not use scented soaps, detergents, perfumes, and cosmetic products. Instead, use gentle, unscented versions of these items. General instructions  Avoid  wearing tight clothes.  Keep a journal to help find out what is causing your itching. Write down: ? What you eat and drink. ? What cosmetic products you use. ? What soaps or detergents you use. ? What you wear, including jewelry.  Use a humidifier. This keeps the air moist, which helps to prevent dry skin.  Be aware of any changes in your itchiness. Contact a health care provider if:  The itching does not go away after several days.  You are unusually thirsty or urinating more than normal.  Your skin tingles or feels numb.  Your skin or the white parts of your eyes turn yellow (jaundice).  You feel weak.  You have any of the following: ? Night sweats. ? Tiredness (fatigue). ? Weight loss. ? Abdominal pain. Summary  Pruritus is an itchy feeling on the skin. One of the most common causes is dry skin, but many different conditions and factors can cause itching.  Apply moisturizing lotion to your skin as needed. Lotion that contains petroleum jelly is best.  Take medicines or apply medicated creams only as told by your health care provider.  Do not take hot showers or baths. Do not use scented soaps, detergents, perfumes, or cosmetic products. This information is not intended to replace advice given to you by your health care provider. Make sure you discuss any questions you have with your health care provider. Document Revised: 10/18/2017 Document Reviewed: 10/18/2017 Elsevier Patient Education  [employer].    If you have lab work done today you will be contacted with your lab results within the next 2 weeks.  If you have not heard from [place] then please contact us. The fastest way to get your results is to register for My Chart.   IF you received an x-ray today, you  will receive an invoice from [facility] Radiology. Please contact [facility] Radiology at [phone] with questions or concerns regarding your invoice.   IF you received labwork today, you will receive  an invoice from [place]. Please contact [facility] at [phone] with questions or concerns regarding your invoice.   Our billing staff will not be able to assist you with questions regarding bills from these companies.  You will be contacted with the lab results as soon as they are available. The fastest way to get your results is to activate your My Chart account. Instructions are located on the last page of this paperwork. If you have not heard from [place] regarding the results in 2 weeks, please contact this office.         Signed, [name], MD [facility]

## 2020-07-09 NOTE — Patient Instructions (Addendum)
I do not see any concerning rash in the office today and because the itching has improved with Eucerin, it is possible that you could have had dry skin as the cause.  Okay to continue Eucerin once or twice per day for the next week or so then if itching returns, follow-up and we can look at other possibilities.   Pruritus Pruritus is an itchy feeling on the skin. One of the most common causes is dry skin, but many different things can cause itching. Most cases of itching do not require medical attention. Sometimes itchy skin can turn into a rash. Follow these instructions at home: Skin care   Apply moisturizing lotion to your skin as needed. Lotion that contains petroleum jelly is best.  Take medicines or apply medicated creams only as told by your health care provider. This may include: ? Corticosteroid cream. ? Anti-itch lotions. ? Oral antihistamines.  Apply a cool, wet cloth (cool compress) to the affected areas.  Take baths with one of the following: ? Epsom salts. You can get these at your local pharmacy or grocery store. Follow the instructions on the packaging. ? Baking soda. Pour a small amount into the bath as told by your health care provider. ? Colloidal oatmeal. You can get this at your local pharmacy or grocery store. Follow the instructions on the packaging.  Apply baking soda paste to your skin. To make the paste, stir water into a small amount of baking soda until it reaches a paste-like consistency.  Do not scratch your skin.  Do not take hot showers or baths, which can make itching worse. A cool shower may help with itching as long as you apply moisturizing lotion after the shower.  Do not use scented soaps, detergents, perfumes, and cosmetic products. Instead, use gentle, unscented versions of these items. General instructions  Avoid wearing tight clothes.  Keep a journal to help find out what is causing your itching. Write down: ? What you eat and  drink. ? What cosmetic products you use. ? What soaps or detergents you use. ? What you wear, including jewelry.  Use a humidifier. This keeps the air moist, which helps to prevent dry skin.  Be aware of any changes in your itchiness. Contact a health care provider if:  The itching does not go away after several days.  You are unusually thirsty or urinating more than normal.  Your skin tingles or feels numb.  Your skin or the white parts of your eyes turn yellow (jaundice).  You feel weak.  You have any of the following: ? Night sweats. ? Tiredness (fatigue). ? Weight loss. ? Abdominal pain. Summary  Pruritus is an itchy feeling on the skin. One of the most common causes is dry skin, but many different conditions and factors can cause itching.  Apply moisturizing lotion to your skin as needed. Lotion that contains petroleum jelly is best.  Take medicines or apply medicated creams only as told by your health care provider.  Do not take hot showers or baths. Do not use scented soaps, detergents, perfumes, or cosmetic products. This information is not intended to replace advice given to you by your health care provider. Make sure you discuss any questions you have with your health care provider. Document Revised: 10/18/2017 Document Reviewed: 10/18/2017 Elsevier Patient Education  [employer].    If you have lab work done today you will be contacted with your lab results within the next 2 weeks.  If you  have not heard from [place] then please contact us. The fastest way to get your results is to register for My Chart.   IF you received an x-ray today, you will receive an invoice from [facility]. Please contact [facility] at [phone] with questions or concerns regarding your invoice.   IF you received labwork today, you will receive an invoice from [place]. Please contact [facility] at [phone] with questions or concerns regarding your invoice.    Our billing staff will not be able to assist you with questions regarding bills from these companies.  You will be contacted with the lab results as soon as they are available. The fastest way to get your results is to activate your My Chart account. Instructions are located on the last page of this paperwork. If you have not heard from [place] regarding the results in 2 weeks, please contact this office.

## 2020-07-20 DIAGNOSIS — Z20822 Contact with and (suspected) exposure to covid-19: Secondary | ICD-10-CM | POA: Diagnosis not present

## 2020-07-30 ENCOUNTER — Encounter: Payer: Self-pay | Admitting: Podiatry

## 2020-07-30 ENCOUNTER — Ambulatory Visit (INDEPENDENT_AMBULATORY_CARE_PROVIDER_SITE_OTHER): Payer: Medicare Other | Admitting: Podiatry

## 2020-07-30 ENCOUNTER — Other Ambulatory Visit: Payer: Self-pay

## 2020-07-30 DIAGNOSIS — B351 Tinea unguium: Secondary | ICD-10-CM | POA: Diagnosis not present

## 2020-07-30 DIAGNOSIS — M79674 Pain in right toe(s): Secondary | ICD-10-CM

## 2020-07-30 DIAGNOSIS — M79675 Pain in left toe(s): Secondary | ICD-10-CM | POA: Diagnosis not present

## 2020-07-30 DIAGNOSIS — M2011 Hallux valgus (acquired), right foot: Secondary | ICD-10-CM

## 2020-07-30 DIAGNOSIS — E1142 Type 2 diabetes mellitus with diabetic polyneuropathy: Secondary | ICD-10-CM | POA: Diagnosis not present

## 2020-07-30 DIAGNOSIS — M2012 Hallux valgus (acquired), left foot: Secondary | ICD-10-CM

## 2020-07-30 NOTE — Patient Instructions (Signed)
Purchase tea tree oil and apply to affected toenails once daily.

## 2020-08-02 DIAGNOSIS — Z23 Encounter for immunization: Secondary | ICD-10-CM | POA: Diagnosis not present

## 2020-08-03 NOTE — Progress Notes (Signed)
Subjective:  Patient ID: Tony Price, male    DOB: 1941/05/19,  MRN: [id number]  Tony Price presents to clinic today for preventative diabetic foot care and painful thick toenails that are difficult to trim. Pain interferes with ambulation. Aggravating factors include wearing enclosed shoe gear. Pain is relieved with periodic professional debridement..  79 y.o. male presents with the above complaint.  Reports painfully elongated nails to both feet.  Review of Systems: Negative except as noted in the HPI. Past Medical History:  Diagnosis Date  . Chest pain   . Diabetes mellitus   . Hypercholesterolemia   . Hypertension   . Kidney stones    Past Surgical History:  Procedure Laterality Date  . CATARACT EXTRACTION    . EYE SURGERY    . PROSTATE BIOPSY    . ROTATOR CUFF REPAIR     Right    Current Outpatient Medications:  .  amLODipine (NORVASC) 10 MG tablet, Take 1 tablet (10 mg total) by mouth daily., Disp: 90 tablet, Rfl: 1 .  aspirin 81 MG chewable tablet, Chew by mouth daily., Disp: , Rfl:  .  atorvastatin (LIPITOR) 20 MG tablet, Take 1 tablet (20 mg total) by mouth daily., Disp: 90 tablet, Rfl: 1 .  blood glucose meter kit and supplies KIT, 1 touch ultra meter.   Check blood sugar once daily E11.9, Disp: 1 each, Rfl: 0 .  Blood Glucose Monitoring Suppl (ONE TOUCH ULTRA SYSTEM KIT) w/Device KIT, Test blood sugar once daily. Dx: E11.9, Disp: 1 each, Rfl: 0 .  carboxymethylcellulose (REFRESH PLUS) 0.5 % SOLN, 1 drop 2 (two) times daily as needed. Into both eyes, Disp: , Rfl:  .  glimepiride (AMARYL) 2 MG tablet, Take 1 tablet (2 mg total) by mouth 2 (two) times daily., Disp: 180 tablet, Rfl: 1 .  glucose blood test strip, Test blood sugar once daily. Dx: E11.9, Disp: 100 each, Rfl: 3 .  Lancets (ONETOUCH ULTRASOFT) lancets, Test blood sugar once daily. Dx: E11.9, Disp: 100 each, Rfl: 3 .  latanoprost (XALATAN) 0.005 % ophthalmic solution, 1 drop at bedtime., Disp: , Rfl:    .  quinapril (ACCUPRIL) 20 MG tablet, Take 1 tablet (20 mg total) by mouth daily., Disp: 90 tablet, Rfl: 1 .  neomycin-polymyxin b-dexamethasone (MAXITROL) 3.5-10000-0.1 OINT, SMARTSIG:Topical, Disp: , Rfl:  No Known Allergies Objective:   Constitutional [name] is a pleasant 79 y.o. African American male, WD, WN in NAD. AAO x 3.   Vascular Dorsalis pedis pulses palpable bilaterally. Posterior tibial pulses palpable bilaterally. Capillary refill normal to all digits. No cyanosis or clubbing noted. Pedal hair growth absent b/l LE.  Neurologic Normal speech. Epicritic sensation to light touch grossly present bilaterally. Vibratory sensation absent b/l LE.  Dermatologic Pedal skin with normal turgor, texture and tone b/l. Nails thick, elongated, dystrophic with pain on palpation x 10.No open wounds. No skin lesions.  Orthopedic: Normal joint ROM without pain or crepitus bilaterally. HAV with bunion deformity b/l LE.No bony tenderness.   Radiographs: None Assessment:   No diagnosis found. Plan:  Patient was evaluated and treated and all questions answered.  Onychomycosis with pain -Nails palliatively debridement as below -Educated on self-care  Procedure: Nail Debridement Rationale: Pain Type of Debridement: manual, sharp debridement. Instrumentation: Nail nipper, rotary burr. Number of Nails: 10 -Examined patient. -Continue diabetic foot care principles. -Toenails 1-5 b/l were debrided in length and girth with sterile nail nippers and dremel without iatrogenic bleeding.  -Patient to report any pedal injuries to medical  professional immediately. -Patient to continue soft, supportive shoe gear daily. -Patient/POA to call should there be question/concern in the interim.  Return in about 3 months (around 10/30/2020).  Marzetta Board, DPM

## 2020-08-13 ENCOUNTER — Encounter (INDEPENDENT_AMBULATORY_CARE_PROVIDER_SITE_OTHER): Payer: Medicare Other | Admitting: Ophthalmology

## 2020-08-20 ENCOUNTER — Encounter (INDEPENDENT_AMBULATORY_CARE_PROVIDER_SITE_OTHER): Payer: Medicare Other | Admitting: Ophthalmology

## 2020-08-20 ENCOUNTER — Other Ambulatory Visit: Payer: Self-pay

## 2020-08-20 DIAGNOSIS — I10 Essential (primary) hypertension: Secondary | ICD-10-CM | POA: Diagnosis not present

## 2020-08-20 DIAGNOSIS — H35033 Hypertensive retinopathy, bilateral: Secondary | ICD-10-CM | POA: Diagnosis not present

## 2020-08-20 DIAGNOSIS — H35341 Macular cyst, hole, or pseudohole, right eye: Secondary | ICD-10-CM | POA: Diagnosis not present

## 2020-08-20 DIAGNOSIS — H43813 Vitreous degeneration, bilateral: Secondary | ICD-10-CM

## 2020-08-20 LAB — HM DIABETES EYE EXAM

## 2020-08-21 ENCOUNTER — Ambulatory Visit (INDEPENDENT_AMBULATORY_CARE_PROVIDER_SITE_OTHER): Payer: Medicare Other | Admitting: Family Medicine

## 2020-08-21 ENCOUNTER — Encounter: Payer: Self-pay | Admitting: Family Medicine

## 2020-08-21 DIAGNOSIS — E785 Hyperlipidemia, unspecified: Secondary | ICD-10-CM | POA: Diagnosis not present

## 2020-08-21 DIAGNOSIS — I10 Essential (primary) hypertension: Secondary | ICD-10-CM

## 2020-08-21 DIAGNOSIS — E11319 Type 2 diabetes mellitus with unspecified diabetic retinopathy without macular edema: Secondary | ICD-10-CM | POA: Diagnosis not present

## 2020-08-21 LAB — HEMOGLOBIN A1C
Est. average glucose Bld gHb Est-mCnc: 151 mg/dL
Hgb A1c MFr Bld: 6.9 % — ABNORMAL HIGH (ref 4.8–5.6)

## 2020-08-21 LAB — LIPID PANEL
Chol/HDL Ratio: 2.6 ratio (ref 0.0–5.0)
Cholesterol, Total: 174 mg/dL (ref 100–199)
HDL: 66 mg/dL (ref >39)
LDL Chol Calc (NIH): 98 mg/dL (ref 0–99)
Triglycerides: 49 mg/dL (ref 0–149)
VLDL Cholesterol Cal: 10 mg/dL (ref 5–40)

## 2020-08-21 LAB — COMPREHENSIVE METABOLIC PANEL
ALT: 11 IU/L (ref 0–44)
AST: 14 IU/L (ref 0–40)
Albumin/Globulin Ratio: 1.4 (ref 1.2–2.2)
Albumin: 4.3 g/dL (ref 3.7–4.7)
Alkaline Phosphatase: 95 IU/L (ref 44–121)
BUN/Creatinine Ratio: 13 (ref 10–24)
BUN: 14 mg/dL (ref 8–27)
Bilirubin Total: 0.4 mg/dL (ref 0.0–1.2)
CO2: 25 mmol/L (ref 20–29)
Calcium: 9.6 mg/dL (ref 8.6–10.2)
Chloride: 105 mmol/L (ref 96–106)
Creatinine, Ser: 1.1 mg/dL (ref 0.76–1.27)
GFR calc Af Amer: 74 mL/min/{1.73_m2} (ref >59)
GFR calc non Af Amer: 64 mL/min/{1.73_m2} (ref >59)
Globulin, Total: 3.1 g/dL (ref 1.5–4.5)
Glucose: 124 mg/dL — ABNORMAL HIGH (ref 65–99)
Potassium: 4.2 mmol/L (ref 3.5–5.2)
Sodium: 145 mmol/L — ABNORMAL HIGH (ref 134–144)
Total Protein: 7.4 g/dL (ref 6.0–8.5)

## 2020-08-21 MED ORDER — ATORVASTATIN CALCIUM 20 MG PO TABS: 20.0000 mg | ORAL_TABLET | Freq: Every day | ORAL | 1 refills | Status: DC

## 2020-08-21 MED ORDER — GLIMEPIRIDE 2 MG PO TABS: 2.0000 mg | ORAL_TABLET | Freq: Two times a day (BID) | ORAL | 1 refills | Status: DC

## 2020-08-21 MED ORDER — AMLODIPINE BESYLATE 10 MG PO TABS: 10.0000 mg | ORAL_TABLET | Freq: Every day | ORAL | 1 refills | Status: DC

## 2020-08-21 MED ORDER — QUINAPRIL HCL 20 MG PO TABS: 20.0000 mg | ORAL_TABLET | Freq: Every day | ORAL | 1 refills | Status: DC

## 2020-08-21 NOTE — Addendum Note (Signed)
Addended by: [name] R on: 08/21/2020 09:13 AM   Modules accepted: Orders

## 2020-08-21 NOTE — Patient Instructions (Addendum)
°  No med changes at this time. Thanks for coming in today.    If you have lab work done today you will be contacted with your lab results within the next 2 weeks.  If you have not heard from [place] then please contact us. The fastest way to get your results is to register for My Chart.   IF you received an x-ray today, you will receive an invoice from [facility]. Please contact [facility] at [phone] with questions or concerns regarding your invoice.   IF you received labwork today, you will receive an invoice from [place]. Please contact [facility] at [phone] with questions or concerns regarding your invoice.   Our billing staff will not be able to assist you with questions regarding bills from these companies.  You will be contacted with the lab results as soon as they are available. The fastest way to get your results is to activate your My Chart account. Instructions are located on the last page of this paperwork. If you have not heard from [place] regarding the results in 2 weeks, please contact this office.

## 2020-08-21 NOTE — Progress Notes (Signed)
Subjective:  Patient ID: Tony Price, male    DOB: 1941/06/07  Age: 79 y.o. MRN: [id number]  CC:  Chief Complaint  Patient presents with  . Follow-up    on hypertension,hyperlipidemia, and diabetes. Pt reports no issues with these conditions since last OV. PT reports doing his best to watch his diet. pt states he dosn't eat much any way. Pt states thismornings BS was 173m/dl fasting. pt states his BP when he checks it at home seems to stay in normal range. PT reports no physical symptoms of these conditions.    HPI Tony Schoenfeldpresents for   Diabetes: Type II with retinopathy.  Has ongoing care with ophthalmology, appt this week - told was stable.  Treated with glimepiride 2 mg twice daily.  He is on statin and ACE inhibitor. Fasting 118 this morning.  Postprandial - none.  No symptomatic lows.  Microalbumin: Due Optho, foot exam, pneumovax: Up to date Wt Readings from Last 3 Encounters:  08/21/20 167 lb (75.8 kg)  07/09/20 167 lb (75.8 kg)  02/21/20 169 lb 12.8 oz (77 kg)     Lab Results  Component Value Date   HGBA1C 6.9 (A) 02/21/2020   HGBA1C 7.1 (H) 11/23/2019   HGBA1C 7.1 (H) 05/16/2019   Lab Results  Component Value Date   MICROALBUR 1.1 08/04/2015   LDLCALC 88 11/23/2019   CREATININE 1.30 (H) 02/21/2020    Hypertension: Amlodipine 10 mg daily, quinapril 20 mg daily. Home readings: 1953systolic.  No new side effects with meds.  BP Readings from Last 3 Encounters:  08/21/20 (!) 142/74  07/09/20 134/80  02/21/20 134/68   Lab Results  Component Value Date   CREATININE 1.30 (H) 02/21/2020    Hyperlipidemia: Lipitor 20 mg daily, no new myalgias.  Lab Results  Component Value Date   CHOL 157 11/23/2019   HDL 57 11/23/2019   LDLCALC 88 11/23/2019   TRIG 58 11/23/2019   CHOLHDL 2.8 11/23/2019   Lab Results  Component Value Date   ALT 9 02/21/2020   AST 19 02/21/2020   ALKPHOS 96 02/21/2020   BILITOT 0.4 02/21/2020       History Patient Active Problem List   Diagnosis Date Noted  . Chest pain   . Cough 10/21/2017  . Acute upper respiratory infection 10/21/2017  . Diabetic retinopathy ([place] 03/31/2016  . Onychomycosis 02/25/2016  . UTI (lower urinary tract infection) 11/17/2014  . DM (diabetes mellitus), type 2 ([place] 11/17/2014  . HTN (hypertension) 11/17/2014  . Sepsis ([place] 11/17/2014   Past Medical History:  Diagnosis Date  . Chest pain   . Diabetes mellitus   . Hypercholesterolemia   . Hypertension   . Kidney stones    Past Surgical History:  Procedure Laterality Date  . CATARACT EXTRACTION    . EYE SURGERY    . PROSTATE BIOPSY    . ROTATOR CUFF REPAIR     Right   No Known Allergies Prior to Admission medications   Medication Sig Start Date End Date Taking? Authorizing Provider  amLODipine (NORVASC) 10 MG tablet Take 1 tablet (10 mg total) by mouth daily. 02/21/20  Yes GWendie Agreste MD  aspirin 81 MG chewable tablet Chew by mouth daily.   Yes [provider]  atorvastatin (LIPITOR) 20 MG tablet Take 1 tablet (20 mg total) by mouth daily. 02/21/20  Yes GWendie Agreste MD  blood glucose meter kit and supplies KIT 1 touch ultra meter.   Check blood sugar once  daily E11.9 07/14/17  Yes [name], MD  Blood Glucose Monitoring Suppl (ONE TOUCH ULTRA SYSTEM KIT) w/Device KIT Test blood sugar once daily. Dx: E11.9 07/01/16  Yes [name], MD  carboxymethylcellulose (REFRESH PLUS) 0.5 % SOLN 1 drop 2 (two) times daily as needed. Into both eyes   Yes [provider]  glimepiride (AMARYL) 2 MG tablet Take 1 tablet (2 mg total) by mouth 2 (two) times daily. 02/21/20  Yes [name], MD  glucose blood test strip Test blood sugar once daily. Dx: E11.9 07/04/17  Yes [name], MD  Lancets [facility] ULTRASOFT) lancets Test blood sugar once daily. Dx: E11.9 07/04/17  Yes [name], MD  latanoprost (XALATAN) 0.005 % ophthalmic solution 1 drop  at bedtime. 08/31/19  Yes [provider]  neomycin-polymyxin b-dexamethasone (MAXITROL) 3.5-10000-0.1 OINT SMARTSIG:Topical 06/26/20  Yes [provider]  quinapril (ACCUPRIL) 20 MG tablet Take 1 tablet (20 mg total) by mouth daily. 02/21/20  Yes [name], MD   Social History   Socioeconomic History  . Marital status: Divorced    Spouse name: Not on file  . Number of children: 3  . Years of education: Not on file  . Highest education level: Not on file  Occupational History  . Not on file  Tobacco Use  . Smoking status: Former Smoker    Years: 2.00  . Smokeless tobacco: Never Used  Substance and Sexual Activity  . Alcohol use: No    Alcohol/week: 0.0 standard drinks  . Drug use: No  . Sexual activity: Not on file  Other Topics Concern  . Not on file  Social History Narrative  . Not on file   Social Determinants of Health   Financial Resource Strain:   . Difficulty of Paying Living Expenses: Not on file  Food Insecurity:   . Worried About Charity fundraiser in the Last Year: Not on file  . Ran Out of Food in the Last Year: Not on file  Transportation Needs:   . Lack of Transportation (Medical): Not on file  . Lack of Transportation (Non-Medical): Not on file  Physical Activity:   . Days of Exercise per Week: Not on file  . Minutes of Exercise per Session: Not on file  Stress:   . Feeling of Stress : Not on file  Social Connections:   . Frequency of Communication with Friends and Family: Not on file  . Frequency of Social Gatherings with Friends and Family: Not on file  . Attends Religious Services: Not on file  . Active Member of Clubs or Organizations: Not on file  . Attends Archivist Meetings: Not on file  . Marital Status: Not on file  Intimate Partner Violence:   . Fear of Current or Ex-Partner: Not on file  . Emotionally Abused: Not on file  . Physically Abused: Not on file  . Sexually Abused: Not on file    Review of  Systems  Constitutional: Negative for fatigue and unexpected weight change.  Eyes: Negative for visual disturbance.  Respiratory: Negative for cough, chest tightness and shortness of breath.   Cardiovascular: Negative for chest pain, palpitations and leg swelling.  Gastrointestinal: Negative for abdominal pain and blood in stool.  Neurological: Negative for dizziness, light-headedness and headaches.     Objective:   Vitals:   08/21/20 0834  BP: (!) 142/74  Pulse: 73  Temp: 98.1 F (36.7 C)  TempSrc: Temporal  SpO2: 99%  Weight: 167 lb (75.8 kg)  Height: '5\' 5"'  (1.651 m)     Physical Exam Vitals reviewed.  Constitutional:      Appearance: He is well-developed.  HENT:     Head: Normocephalic and atraumatic.  Eyes:     Pupils: Pupils are equal, round, and reactive to light.  Neck:     Vascular: No carotid bruit or JVD.  Cardiovascular:     Rate and Rhythm: Normal rate and regular rhythm.     Heart sounds: Normal heart sounds. No murmur heard.   Pulmonary:     Effort: Pulmonary effort is normal.     Breath sounds: Normal breath sounds. No rales.  Skin:    General: Skin is warm and dry.  Neurological:     Mental Status: He is alert and oriented to person, place, and time.    Assessment & Plan:  Tony Price is a 79 y.o. male . Essential hypertension - Plan: amLODipine (NORVASC) 10 MG tablet, quinapril (ACCUPRIL) 20 MG tablet  - Stable, tolerating current regimen. Medications refilled. Labs pending as above.   Hyperlipidemia, unspecified hyperlipidemia type - Plan: atorvastatin (LIPITOR) 20 MG tablet  -  Stable, tolerating current regimen. Medications refilled. Labs pending as above.   Type 2 diabetes mellitus with retinopathy, without long-term current use of insulin, macular edema presence unspecified, unspecified laterality, unspecified retinopathy severity ([place]) - Plan: glimepiride (AMARYL) 2 MG tablet  - tolerating regimen. Check labs, continue optho follow  up.   No orders of the defined types were placed in this encounter.  Patient Instructions       If you have lab work done today you will be contacted with your lab results within the next 2 weeks.  If you have not heard from [place] then please contact us. The fastest way to get your results is to register for My Chart.   IF you received an x-ray today, you will receive an invoice from [facility]. Please contact [facility] at [phone] with questions or concerns regarding your invoice.   IF you received labwork today, you will receive an invoice from [place]. Please contact [facility] at [phone] with questions or concerns regarding your invoice.   Our billing staff will not be able to assist you with questions regarding bills from these companies.  You will be contacted with the lab results as soon as they are available. The fastest way to get your results is to activate your My Chart account. Instructions are located on the last page of this paperwork. If you have not heard from [place] regarding the results in 2 weeks, please contact this office.         Signed, [name], MD [facility]

## 2020-08-22 LAB — MICROALBUMIN / CREATININE URINE RATIO
Creatinine, Urine: 150.6 mg/dL
Microalb/Creat Ratio: 20 mg/g creat (ref 0–29)
Microalbumin, Urine: 30.1 ug/mL

## 2020-09-09 ENCOUNTER — Other Ambulatory Visit: Payer: Self-pay | Admitting: Family Medicine

## 2020-09-23 ENCOUNTER — Telehealth: Payer: Self-pay | Admitting: Family Medicine

## 2020-09-23 MED ORDER — IPRATROPIUM BROMIDE 0.06 % NA SOLN
1.0000 | Freq: Three times a day (TID) | NASAL | 5 refills | Status: DC
Start: 2020-09-23 — End: 2022-07-29

## 2020-09-23 NOTE — Telephone Encounter (Signed)
Nasal spray not on current medication list. Please advise

## 2020-09-23 NOTE — Telephone Encounter (Signed)
Medication Refill - Medication: ipratropium (ATROVENT) 0.06 % nasal spray   Has the patient contacted their pharmacy? No. (Agent: If no, request that the patient contact the pharmacy for the refill.) (Agent: If yes, when and what did the pharmacy advise?)  Preferred Pharmacy (with phone number or street name): [facility] [address]   Agent: Please be advised that RX refills may take up to 3 business days. We ask that you follow-up with your pharmacy.

## 2020-09-23 NOTE — Telephone Encounter (Signed)
Please refill if appropriate ipratropium (ATROVENT) 0.06 % nasal spray last given in 2020.

## 2020-09-23 NOTE — Telephone Encounter (Signed)
Refilled

## 2020-09-29 ENCOUNTER — Telehealth: Payer: Self-pay | Admitting: *Deleted

## 2020-09-29 NOTE — Telephone Encounter (Signed)
Schedule AWV.  

## 2020-09-30 ENCOUNTER — Ambulatory Visit: Payer: Medicare Other

## 2020-11-04 ENCOUNTER — Other Ambulatory Visit: Payer: Self-pay

## 2020-11-04 ENCOUNTER — Ambulatory Visit (INDEPENDENT_AMBULATORY_CARE_PROVIDER_SITE_OTHER): Payer: Medicare Other | Admitting: Podiatry

## 2020-11-04 ENCOUNTER — Encounter: Payer: Self-pay | Admitting: Podiatry

## 2020-11-04 DIAGNOSIS — M2011 Hallux valgus (acquired), right foot: Secondary | ICD-10-CM

## 2020-11-04 DIAGNOSIS — Z860100 Personal history of colon polyps, unspecified: Secondary | ICD-10-CM

## 2020-11-04 DIAGNOSIS — M79674 Pain in right toe(s): Secondary | ICD-10-CM

## 2020-11-04 DIAGNOSIS — M2012 Hallux valgus (acquired), left foot: Secondary | ICD-10-CM

## 2020-11-04 DIAGNOSIS — B351 Tinea unguium: Secondary | ICD-10-CM | POA: Diagnosis not present

## 2020-11-04 DIAGNOSIS — M79675 Pain in left toe(s): Secondary | ICD-10-CM | POA: Diagnosis not present

## 2020-11-04 HISTORY — DX: Personal history of colon polyps, unspecified: Z86.0100

## 2020-11-04 HISTORY — DX: Personal history of colonic polyps: Z86.010

## 2020-11-04 NOTE — Progress Notes (Signed)
Subjective:  Patient ID: Tony Price, male    DOB: 09/18/1941,  MRN: [id number]  80 y.o. male presents with preventative diabetic foot care and painful thick toenails that are difficult to trim. Pain interferes with ambulation. Aggravating factors include wearing enclosed shoe gear. Pain is relieved with periodic professional debridement.   He states he may have picked at his right 3rd toe where there is some dried blood. No redness, drainage or swelling.  Patient's blood sugar was 118 mg/dl this morning.  PCP: [name], MD and last visit was: 08/21/2020.  Review of Systems: Negative except as noted in the HPI.  Past Medical History:  Diagnosis Date  . Chest pain   . Diabetes mellitus   . Hypercholesterolemia   . Hypertension   . Kidney stones    Past Surgical History:  Procedure Laterality Date  . CATARACT EXTRACTION    . EYE SURGERY    . PROSTATE BIOPSY    . ROTATOR CUFF REPAIR     Right   Patient Active Problem List   Diagnosis Date Noted  . Personal history of colonic polyps 11/04/2020  . Chest pain   . Cough 10/21/2017  . Acute upper respiratory infection 10/21/2017  . Diabetic retinopathy ([place]) 03/31/2016  . Onychomycosis 02/25/2016  . UTI (lower urinary tract infection) 11/17/2014  . DM (diabetes mellitus), type 2 ([place]) 11/17/2014  . HTN (hypertension) 11/17/2014  . Sepsis ([place]) 11/17/2014    Current Outpatient Medications:  .  amLODipine (NORVASC) 5 MG tablet, 1 tablet, Disp: , Rfl:  .  Ascorbic Acid (VITAMIN C) 500 MG CAPS, See admin instructions., Disp: , Rfl:  .  atorvastatin (LIPITOR) 20 MG tablet, 1 tablet, Disp: , Rfl:  .  blood glucose meter kit and supplies KIT, 1 touch ultra meter.   Check blood sugar once daily E11.9, Disp: 1 each, Rfl: 0 .  Blood Glucose Monitoring Suppl (ONE TOUCH ULTRA SYSTEM KIT) w/Device KIT, Test blood sugar once daily. Dx: E11.9, Disp: 1 each, Rfl: 0 .  carboxymethylcellulose (REFRESH PLUS) 0.5 % SOLN, 1 drop 2  (two) times daily as needed. Into both eyes, Disp: , Rfl:  .  glimepiride (AMARYL) 2 MG tablet, 1 tablet with breakfast or the first main meal of the day, Disp: , Rfl:  .  glucose blood test strip, Test blood sugar once daily. Dx: E11.9, Disp: 100 each, Rfl: 3 .  ipratropium (ATROVENT) 0.06 % nasal spray, Place 1-2 sprays into both nostrils 3 (three) times daily. As needed for nasal congestion, Disp: 15 mL, Rfl: 5 .  Lancets (ONETOUCH ULTRASOFT) lancets, Test blood sugar once daily. Dx: E11.9, Disp: 100 each, Rfl: 3 .  latanoprost (XALATAN) 0.005 % ophthalmic solution, 1 drop at bedtime., Disp: , Rfl:  .  neomycin-polymyxin b-dexamethasone (MAXITROL) 3.5-10000-0.1 OINT, SMARTSIG:Topical, Disp: , Rfl:  .  quinapril (ACCUPRIL) 20 MG tablet, 1 tablet, Disp: , Rfl:  No Known Allergies Social History   Tobacco Use  Smoking Status Former Smoker  . Years: 2.00  Smokeless Tobacco Never Used    Objective:  There were no vitals filed for this visit. Constitutional Patient is a pleasant 80 y.o. African American male WD, WN in NAD. AAO x 3.  Vascular Capillary refill time to digits immediate b/l. Palpable pedal pulses b/l LE. Pedal hair absent. Lower extremity skin temperature gradient within normal limits. No cyanosis or clubbing noted.  Neurologic Normal speech. Protective sensation intact 5/5 intact bilaterally with 10g monofilament b/l. Vibratory sensation intact b/l.  Dermatologic Pedal skin with normal turgor, texture and tone bilaterally. No open wounds bilaterally. No interdigital macerations bilaterally. Toenails 1-5 left, R hallux, R 2nd toe, R 4th toe and R 5th toe elongated, discolored, dystrophic, thickened, and crumbly with subungual debris and tenderness to dorsal palpation. Anonychia noted R 3rd toe. Nailbed(s) epithelialized.   Orthopedic: Normal muscle strength 5/5 to all lower extremity muscle groups bilaterally. No pain crepitus or joint limitation noted with ROM b/l. Hallux valgus  with bunion deformity noted b/l lower extremities.   Hemoglobin A1C Latest Ref Rng & Units 08/21/2020 02/21/2020 11/23/2019  HGBA1C 4.8 - 5.6 % 6.9(H) 6.9(A) 7.1(H)  Some recent data might be hidden    Assessment:   1. Pain due to onychomycosis of toenails of both feet   2. Hallux valgus, acquired, bilateral    Plan:  Patient was evaluated and treated and all questions answered.  Onychomycosis with pain -Nails palliatively debridement as below. -Educated on self-care  Procedure: Nail Debridement Rationale: Pain Type of Debridement: manual, sharp debridement. Instrumentation: Nail nipper, rotary burr. Number of Nails: 9  -Examined patient. -Continue diabetic foot care principles. -Patient to continue soft, supportive shoe gear daily. -Toenails 1-5 left, R hallux, R 2nd toe, R 4th toe and R 5th toe debrided in length and girth without iatrogenic bleeding with sterile nail nipper and dremel.  -Monitor right 3rd digit for any changes.  -Patient to report any pedal injuries to medical professional immediately. -Patient/POA to call should there be question/concern in the interim.  Return in about 3 months (around 02/02/2021).  Marzetta Board, DPM

## 2021-02-13 ENCOUNTER — Other Ambulatory Visit: Payer: Self-pay | Admitting: Family Medicine

## 2021-02-13 DIAGNOSIS — E785 Hyperlipidemia, unspecified: Secondary | ICD-10-CM

## 2021-02-13 DIAGNOSIS — I10 Essential (primary) hypertension: Secondary | ICD-10-CM

## 2021-02-17 ENCOUNTER — Ambulatory Visit (INDEPENDENT_AMBULATORY_CARE_PROVIDER_SITE_OTHER): Payer: Medicare Other | Admitting: Podiatry

## 2021-02-17 ENCOUNTER — Other Ambulatory Visit: Payer: Self-pay

## 2021-02-17 ENCOUNTER — Encounter: Payer: Self-pay | Admitting: Podiatry

## 2021-02-17 DIAGNOSIS — M79675 Pain in left toe(s): Secondary | ICD-10-CM | POA: Diagnosis not present

## 2021-02-17 DIAGNOSIS — B351 Tinea unguium: Secondary | ICD-10-CM

## 2021-02-17 DIAGNOSIS — M79674 Pain in right toe(s): Secondary | ICD-10-CM

## 2021-02-17 DIAGNOSIS — M2012 Hallux valgus (acquired), left foot: Secondary | ICD-10-CM

## 2021-02-17 DIAGNOSIS — M2011 Hallux valgus (acquired), right foot: Secondary | ICD-10-CM

## 2021-02-22 NOTE — Progress Notes (Signed)
Subjective: Tony Price is a pleasant 80 y.o. male patient seen today painful thick toenails that are difficult to trim. Pain interferes with ambulation. Aggravating factors include wearing enclosed shoe gear. Pain is relieved with periodic professional debridement.  He states his blood glucose was 110 mg/dl this morning.  PCP is Dr. [name] and last visit was 08/21/2020.  No Known Allergies  Objective: Physical Exam  General: Tony Price is a pleasant 80 y.o. African American male, in NAD. AAO x 3.   Vascular:  Capillary refill time to digits immediate b/l. Palpable pedal pulses b/l LE. Pedal hair absent b/l lower extremities. Lower extremity skin temperature gradient within normal limits. No pain with calf compression b/l. No edema noted b/l lower extremities.   Dermatological:  Pedal skin with normal turgor, texture and tone bilaterally. No open wounds bilaterally. No interdigital macerations bilaterally. Toenails 1-5 left, 1, 2, 4, 5 right elongated, discolored, dystrophic, thickened, crumbly with subungual debris and tenderness to dorsal palpation. No hyperkeratotic nor porokeratotic lesions present on today's visit.  Musculoskeletal:  Normal muscle strength 5/5 to all lower extremity muscle groups bilaterally. No pain crepitus or joint limitation noted with ROM b/l. Hallux valgus with bunion deformity noted b/l lower extremities.  Neurological:  Protective sensation intact 5/5 intact bilaterally with 10g monofilament b/l. Vibratory sensation intact b/l. Clonus negative b/l.  Assessment and Plan:  1. Pain due to onychomycosis of toenails of both feet   2. Hallux valgus, acquired, bilateral     -Examined patient. -No new findings. No new orders. -Patient to continue soft, supportive shoe gear daily. -Toenails 1-5 left, R hallux, R 2nd toe, R 4th toe and R 5th toe debrided in length and girth without iatrogenic bleeding with sterile nail nipper and dremel.   -Patient to report any pedal injuries to medical professional immediately. -Patient/POA to call should there be question/concern in the interim.  Return in about 3 months (around 05/20/2021).  Marzetta Board, DPM

## 2021-02-23 ENCOUNTER — Ambulatory Visit: Payer: Self-pay | Admitting: Family Medicine

## 2021-03-09 ENCOUNTER — Other Ambulatory Visit: Payer: Self-pay

## 2021-03-09 ENCOUNTER — Encounter: Payer: Self-pay | Admitting: Emergency Medicine

## 2021-03-09 ENCOUNTER — Ambulatory Visit (INDEPENDENT_AMBULATORY_CARE_PROVIDER_SITE_OTHER): Payer: Medicare Other | Admitting: Emergency Medicine

## 2021-03-09 VITALS — BP 146/82 | HR 81 | Temp 98.3°F | Ht 65.0 in | Wt 168.0 lb

## 2021-03-09 DIAGNOSIS — L989 Disorder of the skin and subcutaneous tissue, unspecified: Secondary | ICD-10-CM

## 2021-03-09 DIAGNOSIS — I152 Hypertension secondary to endocrine disorders: Secondary | ICD-10-CM | POA: Diagnosis not present

## 2021-03-09 DIAGNOSIS — E1159 Type 2 diabetes mellitus with other circulatory complications: Secondary | ICD-10-CM

## 2021-03-09 DIAGNOSIS — Z7689 Persons encountering health services in other specified circumstances: Secondary | ICD-10-CM

## 2021-03-09 LAB — POCT GLYCOSYLATED HEMOGLOBIN (HGB A1C): Hemoglobin A1C: 6.5 % — AB (ref 4.0–5.6)

## 2021-03-09 LAB — CBC WITH DIFFERENTIAL/PLATELET
Basophils Absolute: 0.1 10*3/uL (ref 0.0–0.1)
Basophils Relative: 0.6 % (ref 0.0–3.0)
Eosinophils Absolute: 0.1 10*3/uL (ref 0.0–0.7)
Eosinophils Relative: 0.6 % (ref 0.0–5.0)
HCT: 37.8 % — ABNORMAL LOW (ref 39.0–52.0)
Hemoglobin: 12.3 g/dL — ABNORMAL LOW (ref 13.0–17.0)
Lymphocytes Relative: 27.7 % (ref 12.0–46.0)
Lymphs Abs: 2.5 10*3/uL (ref 0.7–4.0)
MCHC: 32.5 g/dL (ref 30.0–36.0)
MCV: 81.1 fl (ref 78.0–100.0)
Monocytes Absolute: 0.5 10*3/uL (ref 0.1–1.0)
Monocytes Relative: 5.7 % (ref 3.0–12.0)
Neutro Abs: 5.9 10*3/uL (ref 1.4–7.7)
Neutrophils Relative %: 65.4 % (ref 43.0–77.0)
Platelets: 258 10*3/uL (ref 150.0–400.0)
RBC: 4.66 Mil/uL (ref 4.22–5.81)
RDW: 15.4 % (ref 11.5–15.5)
WBC: 9 10*3/uL (ref 4.0–10.5)

## 2021-03-09 NOTE — Assessment & Plan Note (Signed)
Well-controlled hypertension.  Continue amlodipine 10 mg, Accupril 20 mg daily. Well-controlled diabetes with hemoglobin A1c of 6.5.  Continue glimepiride 2 mg daily. Diet and nutrition discussed. Follow-up in 3 months.

## 2021-03-09 NOTE — Patient Instructions (Signed)
Health Maintenance After Age 80 After age 80, you are at a higher risk for certain long-term diseases and infections as well as injuries from falls. Falls are a major cause of broken bones and head injuries in people who are older than age 80. Getting regular preventive care can help to keep you healthy and well. Preventive care includes getting regular testing and making lifestyle changes as recommended by your health care provider. Talk with your health care provider about:  Which screenings and tests you should have. A screening is a test that checks for a disease when you have no symptoms.  A diet and exercise plan that is right for you. What should I know about screenings and tests to prevent falls? Screening and testing are the best ways to find a health problem early. Early diagnosis and treatment give you the best chance of managing medical conditions that are common after age 80. Certain conditions and lifestyle choices may make you more likely to have a fall. Your health care provider may recommend:  Regular vision checks. Poor vision and conditions such as cataracts can make you more likely to have a fall. If you wear glasses, make sure to get your prescription updated if your vision changes.  Medicine review. Work with your health care provider to regularly review all of the medicines you are taking, including over-the-counter medicines. Ask your health care provider about any side effects that may make you more likely to have a fall. Tell your health care provider if any medicines that you take make you feel dizzy or sleepy.  Osteoporosis screening. Osteoporosis is a condition that causes the bones to get weaker. This can make the bones weak and cause them to break more easily.  Blood pressure screening. Blood pressure changes and medicines to control blood pressure can make you feel dizzy.  Strength and balance checks. Your health care provider may recommend certain tests to check your  strength and balance while standing, walking, or changing positions.  Foot health exam. Foot pain and numbness, as well as not wearing proper footwear, can make you more likely to have a fall.  Depression screening. You may be more likely to have a fall if you have a fear of falling, feel emotionally low, or feel unable to do activities that you used to do.  Alcohol use screening. Using too much alcohol can affect your balance and may make you more likely to have a fall. What actions can I take to lower my risk of falls? General instructions  Talk with your health care provider about your risks for falling. Tell your health care provider if: ? You fall. Be sure to tell your health care provider about all falls, even ones that seem minor. ? You feel dizzy, sleepy, or off-balance.  Take over-the-counter and prescription medicines only as told by your health care provider. These include any supplements.  Eat a healthy diet and maintain a healthy weight. A healthy diet includes low-fat dairy products, low-fat (lean) meats, and fiber from whole grains, beans, and lots of fruits and vegetables. Home safety  Remove any tripping hazards, such as rugs, cords, and clutter.  Install safety equipment such as grab bars in bathrooms and safety rails on stairs.  Keep rooms and walkways well-lit. Activity  Follow a regular exercise program to stay fit. This will help you maintain your balance. Ask your health care provider what types of exercise are appropriate for you.  If you need a cane or walker,   use it as recommended by your health care provider.  Wear supportive shoes that have nonskid soles.   Lifestyle  Do not drink alcohol if your health care provider tells you not to drink.  If you drink alcohol, limit how much you have: ? 0-1 drink a day for women. ? 0-2 drinks a day for men.  Be aware of how much alcohol is in your drink. In the [place] one drink equals one typical bottle of beer (12  oz), one-half glass of wine (5 oz), or one shot of hard liquor (1 oz).  Do not use any products that contain nicotine or tobacco, such as cigarettes and e-cigarettes. If you need help quitting, ask your health care provider. Summary  Having a healthy lifestyle and getting preventive care can help to protect your health and wellness after age 80.  Screening and testing are the best way to find a health problem early and help you avoid having a fall. Early diagnosis and treatment give you the best chance for managing medical conditions that are more common for people who are older than age 80.  Falls are a major cause of broken bones and head injuries in people who are older than age 80. Take precautions to prevent a fall at home.  Work with your health care provider to learn what changes you can make to improve your health and wellness and to prevent falls. This information is not intended to replace advice given to you by your health care provider. Make sure you discuss any questions you have with your health care provider. Document Revised: 01/25/2019 Document Reviewed: 08/17/2017 [employer] Patient Education  2021 [employer]  

## 2021-03-09 NOTE — Progress Notes (Signed)
Tony Price 80 y.o.   Chief Complaint  Patient presents with  . Transitions Of Care    6 month check on BP and diabetes    HISTORY OF PRESENT ILLNESS: This is a 80 y.o. male here to establish care with me.  Former patient of Dr. Carlota Raspberry. Has history of diabetes and hypertension. Presently on amlodipine, glimepiride, ACE inhibitor, and statin. Has no complaints or medical concerns today. Lab Results  Component Value Date   HGBA1C 6.9 (H) 08/21/2020   BP Readings from Last 3 Encounters:  03/09/21 (!) 146/82  08/21/20 126/72  07/09/20 134/80    HPI   Prior to Admission medications   Medication Sig Start Date End Date Taking? Authorizing Provider  amLODipine (NORVASC) 10 MG tablet Take 1 tablet by mouth daily. 12/27/20  Yes [provider]  amLODipine (NORVASC) 5 MG tablet 1 tablet   Yes [provider]  Ascorbic Acid (VITAMIN C) 500 MG CAPS See admin instructions.   Yes [provider]  aspirin 81 MG EC tablet 1 tablet   Yes [provider]  atorvastatin (LIPITOR) 20 MG tablet TAKE 1 TABLET BY MOUTH EVERY DAY 02/13/21  Yes [name], MD  blood glucose meter kit and supplies KIT 1 touch ultra meter.   Check blood sugar once daily E11.9 07/14/17  Yes [name], MD  Blood Glucose Monitoring Suppl (ONE TOUCH ULTRA SYSTEM KIT) w/Device KIT Test blood sugar once daily. Dx: E11.9 07/01/16  Yes [name], MD  carboxymethylcellulose (REFRESH PLUS) 0.5 % SOLN 1 drop 2 (two) times daily as needed. Into both eyes   Yes [provider]  glimepiride (AMARYL) 2 MG tablet 1 tablet with breakfast or the first main meal of the day   Yes [provider]  glucose blood test strip Test blood sugar once daily. Dx: E11.9 07/04/17  Yes [name], MD  ipratropium (ATROVENT) 0.06 % nasal spray Place 1-2 sprays into both nostrils 3 (three) times daily. As needed for nasal congestion 09/23/20  Yes [name], MD   Lancets [facility] ULTRASOFT) lancets Test blood sugar once daily. Dx: E11.9 07/04/17  Yes [name], MD  latanoprost (XALATAN) 0.005 % ophthalmic solution 1 drop at bedtime. 08/31/19  Yes [provider]  neomycin-polymyxin b-dexamethasone (MAXITROL) 3.5-10000-0.1 OINT SMARTSIG:Topical 06/26/20  Yes [provider]  quinapril (ACCUPRIL) 20 MG tablet TAKE 1 TABLET BY MOUTH EVERY DAY 02/13/21  Yes [name], MD    No Known Allergies  Patient Active Problem List   Diagnosis Date Noted  . Personal history of colonic polyps 11/04/2020  . Diabetic retinopathy ([place]) 03/31/2016  . DM (diabetes mellitus), type 2 ([place]) 11/17/2014  . HTN (hypertension) 11/17/2014    Past Medical History:  Diagnosis Date  . Chest pain   . Diabetes mellitus   . Hypercholesterolemia   . Hypertension   . Kidney stones     Past Surgical History:  Procedure Laterality Date  . CATARACT EXTRACTION    . EYE SURGERY    . PROSTATE BIOPSY    . ROTATOR CUFF REPAIR     Right    Social History   Socioeconomic History  . Marital status: Divorced    Spouse name: Not on file  . Number of children: 3  . Years of education: Not on file  . Highest education level: Not on file  Occupational History  . Not on file  Tobacco Use  . Smoking status: Former  Smoker    Years: 2.00  . Smokeless tobacco: Never Used  Substance and Sexual Activity  . Alcohol use: No    Alcohol/week: 0.0 standard drinks  . Drug use: No  . Sexual activity: Not on file  Other Topics Concern  . Not on file  Social History Narrative  . Not on file   Social Determinants of Health   Financial Resource Strain: Not on file  Food Insecurity: Not on file  Transportation Needs: Not on file  Physical Activity: Not on file  Stress: Not on file  Social Connections: Not on file  Intimate Partner Violence: Not on file    Family History  Family history unknown: Yes     Review of Systems  Constitutional:  Negative.  Negative for chills and fever.  HENT: Negative.  Negative for congestion and sore throat.   Respiratory: Negative.  Negative for cough and shortness of breath.   Cardiovascular: Negative.  Negative for chest pain and palpitations.  Gastrointestinal: Negative for abdominal pain, nausea and vomiting.  Genitourinary: Negative.  Negative for dysuria and hematuria.  Skin: Positive for rash (Lower extremities).  Neurological: Negative.  Negative for dizziness and headaches.  All other systems reviewed and are negative.    Physical Exam Vitals reviewed.  Constitutional:      Appearance: Normal appearance.  HENT:     Head: Normocephalic.  Eyes:     Extraocular Movements: Extraocular movements intact.     Conjunctiva/sclera: Conjunctivae normal.     Pupils: Pupils are equal, round, and reactive to light.  Cardiovascular:     Rate and Rhythm: Normal rate and regular rhythm.     Pulses: Normal pulses.     Heart sounds: Normal heart sounds.  Pulmonary:     Effort: Pulmonary effort is normal.     Breath sounds: Normal breath sounds.  Musculoskeletal:        General: Normal range of motion.     Cervical back: Normal range of motion and neck supple.     Right lower leg: No edema.     Left lower leg: No edema.  Skin:    General: Skin is warm and dry.     Capillary Refill: Capillary refill takes less than 2 seconds.     Findings: Lesion present.     Comments: Hyperpigmented round skin lesions to lower legs mostly left-sided  Neurological:     General: No focal deficit present.     Mental Status: He is alert and oriented to person, place, and time.  Psychiatric:        Mood and Affect: Mood normal.        Behavior: Behavior normal.    Results for orders placed or performed in visit on 03/09/21 (from the past 24 hour(s))  POCT glycosylated hemoglobin (Hb A1C)     Status: Abnormal   Collection Time: 03/09/21  3:36 PM  Result Value Ref Range   Hemoglobin A1C 6.5 (A) 4.0 - 5.6  %   HbA1c POC (<> result, manual entry)     HbA1c, POC (prediabetic range)     HbA1c, POC (controlled diabetic range)      ASSESSMENT & PLAN: A total of 30 minutes was spent with the patient and counseling/coordination of care regarding establishing care with me, diabetes and hypertension cardiovascular risks associated with these conditions, review of all medications, review of most recent office visit notes, review of most recent blood work results including today's hemoglobin A1c, health maintenance items, prognosis, documentation,  need for follow-up.  Hypertension associated with diabetes ([place]) Well-controlled hypertension.  Continue amlodipine 10 mg, Accupril 20 mg daily. Well-controlled diabetes with hemoglobin A1c of 6.5.  Continue glimepiride 2 mg daily. Diet and nutrition discussed. Follow-up in 3 months.  Tony Price was seen today for transitions of care.  Diagnoses and all orders for this visit:  Hypertension associated with diabetes ([place]) -     POCT glycosylated hemoglobin (Hb A1C) -     Comprehensive metabolic panel -     CBC with Differential/Platelet -     Cancel: Hemoglobin A1c -     Lipid panel  Encounter to establish care  Skin lesions -     Ambulatory referral to Dermatology    Patient Instructions   Health Maintenance After Age 68 After age 48, you are at a higher risk for certain long-term diseases and infections as well as injuries from falls. Falls are a major cause of broken bones and head injuries in people who are older than age 4. Getting regular preventive care can help to keep you healthy and well. Preventive care includes getting regular testing and making lifestyle changes as recommended by your health care provider. Talk with your health care provider about:  Which screenings and tests you should have. A screening is a test that checks for a disease when you have no symptoms.  A diet and exercise plan that is right for you. What should I know  about screenings and tests to prevent falls? Screening and testing are the best ways to find a health problem early. Early diagnosis and treatment give you the best chance of managing medical conditions that are common after age 7. Certain conditions and lifestyle choices may make you more likely to have a fall. Your health care provider may recommend:  Regular vision checks. Poor vision and conditions such as cataracts can make you more likely to have a fall. If you wear glasses, make sure to get your prescription updated if your vision changes.  Medicine review. Work with your health care provider to regularly review all of the medicines you are taking, including over-the-counter medicines. Ask your health care provider about any side effects that may make you more likely to have a fall. Tell your health care provider if any medicines that you take make you feel dizzy or sleepy.  Osteoporosis screening. Osteoporosis is a condition that causes the bones to get weaker. This can make the bones weak and cause them to break more easily.  Blood pressure screening. Blood pressure changes and medicines to control blood pressure can make you feel dizzy.  Strength and balance checks. Your health care provider may recommend certain tests to check your strength and balance while standing, walking, or changing positions.  Foot health exam. Foot pain and numbness, as well as not wearing proper footwear, can make you more likely to have a fall.  Depression screening. You may be more likely to have a fall if you have a fear of falling, feel emotionally low, or feel unable to do activities that you used to do.  Alcohol use screening. Using too much alcohol can affect your balance and may make you more likely to have a fall. What actions can I take to lower my risk of falls? General instructions  Talk with your health care provider about your risks for falling. Tell your health care provider if: ? You fall.  Be sure to tell your health care provider about all falls, even ones that seem  minor. ? You feel dizzy, sleepy, or off-balance.  Take over-the-counter and prescription medicines only as told by your health care provider. These include any supplements.  Eat a healthy diet and maintain a healthy weight. A healthy diet includes low-fat dairy products, low-fat (lean) meats, and fiber from whole grains, beans, and lots of fruits and vegetables. Home safety  Remove any tripping hazards, such as rugs, cords, and clutter.  Install safety equipment such as grab bars in bathrooms and safety rails on stairs.  Keep rooms and walkways well-lit. Activity  Follow a regular exercise program to stay fit. This will help you maintain your balance. Ask your health care provider what types of exercise are appropriate for you.  If you need a cane or walker, use it as recommended by your health care provider.  Wear supportive shoes that have nonskid soles.   Lifestyle  Do not drink alcohol if your health care provider tells you not to drink.  If you drink alcohol, limit how much you have: ? 0-1 drink a day for women. ? 0-2 drinks a day for men.  Be aware of how much alcohol is in your drink. In the [place] one drink equals one typical bottle of beer (12 oz), one-half glass of wine (5 oz), or one shot of hard liquor (1 oz).  Do not use any products that contain nicotine or tobacco, such as cigarettes and e-cigarettes. If you need help quitting, ask your health care provider. Summary  Having a healthy lifestyle and getting preventive care can help to protect your health and wellness after age 38.  Screening and testing are the best way to find a health problem early and help you avoid having a fall. Early diagnosis and treatment give you the best chance for managing medical conditions that are more common for people who are older than age 86.  Falls are a major cause of broken bones and head injuries in  people who are older than age 33. Take precautions to prevent a fall at home.  Work with your health care provider to learn what changes you can make to improve your health and wellness and to prevent falls. This information is not intended to replace advice given to you by your health care provider. Make sure you discuss any questions you have with your health care provider. Document Revised: 01/25/2019 Document Reviewed: 08/17/2017 Elsevier Patient Education  2021 [place], MD [facility]

## 2021-03-10 LAB — LIPID PANEL
Cholesterol: 165 mg/dL (ref 0–200)
HDL: 59.6 mg/dL (ref >39.00)
LDL Cholesterol: 86 mg/dL (ref 0–99)
NonHDL: 104.9
Total CHOL/HDL Ratio: 3
Triglycerides: 94 mg/dL (ref 0.0–149.0)
VLDL: 18.8 mg/dL (ref 0.0–40.0)

## 2021-03-10 LAB — COMPREHENSIVE METABOLIC PANEL
ALT: 8 U/L (ref 0–53)
AST: 15 U/L (ref 0–37)
Albumin: 4.1 g/dL (ref 3.5–5.2)
Alkaline Phosphatase: 68 U/L (ref 39–117)
BUN: 16 mg/dL (ref 6–23)
CO2: 29 mEq/L (ref 19–32)
Calcium: 9.2 mg/dL (ref 8.4–10.5)
Chloride: 106 mEq/L (ref 96–112)
Creatinine, Ser: 1.27 mg/dL (ref 0.40–1.50)
GFR: 53.76 mL/min — ABNORMAL LOW (ref >60.00)
Glucose, Bld: 115 mg/dL — ABNORMAL HIGH (ref 70–99)
Potassium: 3.8 mEq/L (ref 3.5–5.1)
Sodium: 143 mEq/L (ref 135–145)
Total Bilirubin: 0.4 mg/dL (ref 0.2–1.2)
Total Protein: 7.4 g/dL (ref 6.0–8.3)

## 2021-03-12 ENCOUNTER — Telehealth: Payer: Self-pay | Admitting: Emergency Medicine

## 2021-03-12 DIAGNOSIS — R413 Other amnesia: Secondary | ICD-10-CM

## 2021-03-12 NOTE — Telephone Encounter (Signed)
Patient's wife called to request referrals for a memory test and hearing test. Per patient's wife he is really struggling with his memory.   Please advise.

## 2021-03-17 ENCOUNTER — Telehealth: Payer: Self-pay

## 2021-03-17 NOTE — Telephone Encounter (Signed)
Needs neurology referral please.  Thanks.

## 2021-03-17 NOTE — Telephone Encounter (Signed)
Referral for neurology was placed 03/17/21.

## 2021-03-17 NOTE — Telephone Encounter (Signed)
Cancel referral order on this encounter.

## 2021-03-18 ENCOUNTER — Telehealth: Payer: Self-pay

## 2021-03-18 NOTE — Telephone Encounter (Signed)
Patient's wife called and stated that she wanted a referral for memory loss and hearing issues for [name]. A referral was placed for neurology, but they are requiring additional notes on the diagnoses.This issue has not been discussed with provider.  Called patient to schedule OV to discuss memory loss, left a VM to callback.

## 2021-03-25 ENCOUNTER — Ambulatory Visit: Payer: Medicare Other | Admitting: Family Medicine

## 2021-03-26 ENCOUNTER — Other Ambulatory Visit: Payer: Self-pay | Admitting: Family Medicine

## 2021-03-26 DIAGNOSIS — E11319 Type 2 diabetes mellitus with unspecified diabetic retinopathy without macular edema: Secondary | ICD-10-CM

## 2021-03-26 DIAGNOSIS — I10 Essential (primary) hypertension: Secondary | ICD-10-CM

## 2021-03-27 DIAGNOSIS — R972 Elevated prostate specific antigen [PSA]: Secondary | ICD-10-CM | POA: Diagnosis not present

## 2021-04-03 DIAGNOSIS — N5201 Erectile dysfunction due to arterial insufficiency: Secondary | ICD-10-CM | POA: Diagnosis not present

## 2021-04-03 DIAGNOSIS — N400 Enlarged prostate without lower urinary tract symptoms: Secondary | ICD-10-CM | POA: Diagnosis not present

## 2021-04-03 DIAGNOSIS — R972 Elevated prostate specific antigen [PSA]: Secondary | ICD-10-CM | POA: Diagnosis not present

## 2021-04-15 ENCOUNTER — Other Ambulatory Visit: Payer: Self-pay | Admitting: Urology

## 2021-04-15 DIAGNOSIS — R972 Elevated prostate specific antigen [PSA]: Secondary | ICD-10-CM

## 2021-04-17 DIAGNOSIS — Z20822 Contact with and (suspected) exposure to covid-19: Secondary | ICD-10-CM | POA: Diagnosis not present

## 2021-04-29 DIAGNOSIS — Z23 Encounter for immunization: Secondary | ICD-10-CM | POA: Diagnosis not present

## 2021-05-10 ENCOUNTER — Other Ambulatory Visit: Payer: Self-pay

## 2021-05-10 ENCOUNTER — Ambulatory Visit
Admission: RE | Admit: 2021-05-10 | Discharge: 2021-05-10 | Disposition: A | Discharge status: Home / Self Care | Payer: Medicare Other | Source: Ambulatory Visit | Attending: Urology | Admitting: Urology

## 2021-05-10 DIAGNOSIS — R972 Elevated prostate specific antigen [PSA]: Secondary | ICD-10-CM | POA: Diagnosis not present

## 2021-05-10 DIAGNOSIS — N400 Enlarged prostate without lower urinary tract symptoms: Secondary | ICD-10-CM | POA: Diagnosis not present

## 2021-05-10 DIAGNOSIS — R590 Localized enlarged lymph nodes: Secondary | ICD-10-CM | POA: Diagnosis not present

## 2021-05-10 MED ORDER — GADOBENATE DIMEGLUMINE 529 MG/ML IV SOLN
15.0000 mL | Freq: Once | INTRAVENOUS | Status: AC | PRN
  Administered 2021-05-10: 15 mL via INTRAVENOUS

## 2021-05-20 ENCOUNTER — Other Ambulatory Visit: Payer: Self-pay

## 2021-05-20 ENCOUNTER — Ambulatory Visit (INDEPENDENT_AMBULATORY_CARE_PROVIDER_SITE_OTHER): Payer: Medicare Other | Admitting: Podiatry

## 2021-05-20 DIAGNOSIS — B351 Tinea unguium: Secondary | ICD-10-CM | POA: Diagnosis not present

## 2021-05-20 DIAGNOSIS — M79674 Pain in right toe(s): Secondary | ICD-10-CM | POA: Diagnosis not present

## 2021-05-20 DIAGNOSIS — M79675 Pain in left toe(s): Secondary | ICD-10-CM | POA: Diagnosis not present

## 2021-05-24 ENCOUNTER — Encounter: Payer: Self-pay | Admitting: Podiatry

## 2021-05-24 NOTE — Progress Notes (Signed)
Subjective: Tony Price is a pleasant 80 y.o. male patient seen today painful thick toenails that are difficult to trim. Pain interferes with ambulation. Aggravating factors include wearing enclosed shoe gear. Pain is relieved with periodic professional debridement.  He has h/o diabetes. Patient states his blood glucose was 118 mg/dl today. He voices no new pedal problems on today's visit.  PCP is Horald Pollen, MD. Last visit was: 03/09/2021.  No Known Allergies  Objective: Physical Exam  General: Tony Price is a pleasant 80 y.o. African American male, WD, WN in NAD. AAO x 3.   Vascular:  Capillary refill time to digits immediate b/l. Palpable DP pulse(s) b/l lower extremities Palpable PT pulse(s) b/l lower extremities Pedal hair absent. Lower extremity skin temperature gradient within normal limits. No pain with calf compression b/l. No edema noted b/l lower extremities.  Dermatological:  Pedal skin with normal turgor, texture and tone b/l lower extremities. No open wounds b/l lower extremities. No interdigital macerations b/l lower extremities. Toenails 1-5 left, R hallux, R 2nd toe, R 4th toe, and R 5th toe elongated, discolored, dystrophic, thickened, and crumbly with subungual debris and tenderness to dorsal palpation. Anonychia noted R 3rd toe. Nailbed(s) epithelialized.   Musculoskeletal:  Normal muscle strength 5/5 to all lower extremity muscle groups bilaterally. Hallux valgus with bunion deformity noted b/l lower extremities.  Neurological:  Protective sensation intact 5/5 intact bilaterally with 10g monofilament b/l. Vibratory sensation intact b/l.  Assessment and Plan:  1. Pain due to onychomycosis of toenails of both feet   -No new findings. No new orders. -Continue diabetic foot care principles. -Patient to continue soft, supportive shoe gear daily. -Toenails 1-5 left, R hallux, R 2nd toe, R 4th toe, and R 5th toe debrided in length and girth without  iatrogenic bleeding with sterile nail nipper and dremel.  -Patient to report any pedal injuries to medical professional immediately. -Patient/POA to call should there be question/concern in the interim.  Return in about 3 months (around 08/20/2021).  Marzetta Board, DPM

## 2021-06-04 DIAGNOSIS — L281 Prurigo nodularis: Secondary | ICD-10-CM | POA: Diagnosis not present

## 2021-06-23 DIAGNOSIS — R972 Elevated prostate specific antigen [PSA]: Secondary | ICD-10-CM | POA: Diagnosis not present

## 2021-06-29 DIAGNOSIS — R972 Elevated prostate specific antigen [PSA]: Secondary | ICD-10-CM | POA: Diagnosis not present

## 2021-06-29 DIAGNOSIS — N401 Enlarged prostate with lower urinary tract symptoms: Secondary | ICD-10-CM | POA: Diagnosis not present

## 2021-06-29 DIAGNOSIS — R351 Nocturia: Secondary | ICD-10-CM | POA: Diagnosis not present

## 2021-07-05 ENCOUNTER — Ambulatory Visit (INDEPENDENT_AMBULATORY_CARE_PROVIDER_SITE_OTHER): Payer: Medicare Other | Admitting: *Deleted

## 2021-07-05 DIAGNOSIS — Z0000 Encounter for general adult medical examination without abnormal findings: Secondary | ICD-10-CM

## 2021-07-05 NOTE — Progress Notes (Addendum)
Subjective:   Dillion Stowers is a 80 y.o. male who presents for Medicare Annual/Subsequent preventive examination.  I connected with  [name] on 07/05/21 by audio enabled telemedicine application and verified that I am speaking with the correct person using two identifiers.   I discussed the limitations of evaluation and management by telemedicine. The patient expressed understanding and agreed to proceed.   Location of Patient: Home Location of Provider: Home Office Persons participating in visit: Tony Price (patient) & Jari Favre, CMA  Review of Systems    Defer to PCP Cardiac Risk Factors include: none     Objective:    There were no vitals filed for this visit. There is no height or weight on file to calculate BMI.  Advanced Directives 07/05/2021 06/14/2019 07/08/2017 11/17/2014 05/02/2014  Does Patient Have a Medical Advance Directive? Yes No No No Patient would like information  Type of Advance Directive Living will - - - -  Does patient want to make changes to medical advance directive? No - Patient declined - - - -  Copy of [place] in Chart? No - copy requested - - - -  Would patient like information on creating a medical advance directive? - No - Patient declined Yes (MAU/Ambulatory/Procedural Areas - Information given) No - patient declined information Advance directive brochure given (Outpatient ONLY)    Current Medications (verified) Outpatient Encounter Medications as of 07/05/2021  Medication Sig   amLODipine (NORVASC) 10 MG tablet TAKE 1 TABLET BY MOUTH EVERY DAY   amLODipine (NORVASC) 5 MG tablet 1 tablet   Ascorbic Acid (VITAMIN C) 500 MG CAPS See admin instructions.   aspirin 81 MG EC tablet 1 tablet   atorvastatin (LIPITOR) 20 MG tablet TAKE 1 TABLET BY MOUTH EVERY DAY   blood glucose meter kit and supplies KIT 1 touch ultra meter.   Check blood sugar once daily E11.9   Blood Glucose Monitoring Suppl (ONE TOUCH ULTRA SYSTEM KIT) w/Device  KIT Test blood sugar once daily. Dx: E11.9   carboxymethylcellulose (REFRESH PLUS) 0.5 % SOLN 1 drop 2 (two) times daily as needed. Into both eyes   glimepiride (AMARYL) 2 MG tablet TAKE 1 TABLET BY MOUTH 2 TIMES DAILY.   glucose blood test strip Test blood sugar once daily. Dx: E11.9   ipratropium (ATROVENT) 0.06 % nasal spray Place 1-2 sprays into both nostrils 3 (three) times daily. As needed for nasal congestion   Lancets (ONETOUCH ULTRASOFT) lancets Test blood sugar once daily. Dx: E11.9   latanoprost (XALATAN) 0.005 % ophthalmic solution 1 drop at bedtime.   neomycin-polymyxin b-dexamethasone (MAXITROL) 3.5-10000-0.1 OINT SMARTSIG:Topical   quinapril (ACCUPRIL) 20 MG tablet TAKE 1 TABLET BY MOUTH EVERY DAY   No facility-administered encounter medications on file as of 07/05/2021.    Allergies (verified) Patient has no known allergies.   History: Past Medical History:  Diagnosis Date   Chest pain    Diabetes mellitus    Hypercholesterolemia    Hypertension    Kidney stones    Past Surgical History:  Procedure Laterality Date   CATARACT EXTRACTION     EYE SURGERY     PROSTATE BIOPSY     ROTATOR CUFF REPAIR     Right   Family History  Family history unknown: Yes   Social History   Socioeconomic History   Marital status: Divorced    Spouse name: Not on file   Number of children: 3   Years of education: Not on file  Highest education level: Not on file  Occupational History   Not on file  Tobacco Use   Smoking status: Former    Years: 2.00    Types: Cigarettes   Smokeless tobacco: Never  Substance and Sexual Activity   Alcohol use: No    Alcohol/week: 0.0 standard drinks   Drug use: No   Sexual activity: Not on file  Other Topics Concern   Not on file  Social History Narrative   Not on file   Social Determinants of Health   Financial Resource Strain: Low Risk    Difficulty of Paying Living Expenses: Not hard at all  Food Insecurity: No Food Insecurity    Worried About Charity fundraiser in the Last Year: Never true   [place] in the Last Year: Never true  Transportation Needs: No Transportation Needs   Lack of Transportation (Medical): No   Lack of Transportation (Non-Medical): No  Physical Activity: Sufficiently Active   Days of Exercise per Week: 7 days   Minutes of Exercise per Session: 60 min  Stress: No Stress Concern Present   Feeling of Stress : Not at all  Social Connections: Moderately Isolated   Frequency of Communication with Friends and Family: More than three times a week   Frequency of Social Gatherings with Friends and Family: Twice a week   Attends Religious Services: More than 4 times per year   Active Member of [employer] or Organizations: No   Attends Archivist Meetings: Never   Marital Status: Widowed    Tobacco Counseling Counseling given: Not Answered   Clinical Intake:     Pain : No/denies pain     BMI - recorded: 32.62 Nutritional Status: BMI > 30  Obese Nutritional Risks: Other (Comment) Diabetes: Yes CBG done?: No Did pt. bring in CBG monitor from home?: No  How often do you need to have someone help you when you read instructions, pamphlets, or other written materials from your doctor or pharmacy?: 1 - Never  Diabetic? Yes  Interpreter Needed?: No      Activities of Daily Living In your present state of health, do you have any difficulty performing the following activities: 07/05/2021  Hearing? N  Vision? N  Difficulty concentrating or making decisions? N  Walking or climbing stairs? N  Dressing or bathing? N  Doing errands, shopping? N  Preparing Food and eating ? N  Using the Toilet? N  In the past six months, have you accidently leaked urine? N  Do you have problems with loss of bowel control? N  Managing your Medications? N  Managing your Finances? N  Housekeeping or managing your Housekeeping? N  Some recent data might be hidden    Patient Care  Team: Horald Pollen, MD as PCP - General (Internal Medicine) Hayden Pedro, MD as Consulting Physician (Ophthalmology) Monna Fam, MD as Consulting Physician (Ophthalmology)  Indicate any recent Medical Services you may have received from other than [facility] providers in the past year (date may be approximate).     Assessment:   This is a routine wellness examination for H[place].  Hearing/Vision screen No results found.  Dietary issues and exercise activities discussed: Current Exercise Habits: Home exercise routine, Type of exercise: walking, Time (Minutes): 60, Frequency (Times/Week): 7, Weekly Exercise (Minutes/Week): 420, Intensity: Mild, Exercise limited by: None identified   Goals Addressed   None    Depression Screen [facility] 2/9 Scores 07/05/2021 03/09/2021 07/09/2020 02/21/2020 11/23/2019 08/16/2019 06/21/2019  PHQ - 2 Score 0 0 0 0 0 0 0    Fall Risk Fall Risk  07/05/2021 03/09/2021 07/09/2020 02/21/2020 11/23/2019  Falls in the past year? 0 0 0 0 0  Number falls in past yr: 0 0 - 0 0  Injury with Fall? 0 0 - 0 0  Follow up - - Falls evaluation completed Falls evaluation completed -    FALL RISK PREVENTION PERTAINING TO THE HOME:  Any stairs in or around the home? Yes  If so, are there any without handrails? Yes  Home free of loose throw rugs in walkways, pet beds, electrical cords, etc? Yes  Adequate lighting in your home to reduce risk of falls? Yes   ASSISTIVE DEVICES UTILIZED TO PREVENT FALLS:  Life alert? No  Use of a cane, walker or w/c? No  Grab bars in the bathroom? Yes  Shower chair or bench in shower? YES Elevated toilet seat or a handicapped toilet? No   TIMED UP AND GO:  Was the test performed?  n/a .  Length of time to ambulate 10 feet: n/a sec.     Cognitive Function: MMSE - Mini Mental State Exam 06/17/2016  Orientation to time 5  Orientation to Place 5  Registration 3  Attention/ Calculation 5  Recall 2  Language- name 2 objects 2   Language- repeat 1  Language- follow 3 step command 3  Language- read & follow direction 1  Write a sentence 1  Copy design 1  Total score 29     6CIT Screen 07/05/2021 06/14/2019 07/08/2017  What Year? 0 points 0 points 0 points  What month? 0 points 0 points 0 points  What time? 0 points 0 points 0 points  Count back from 20 0 points 0 points 0 points  Months in reverse 0 points 0 points 0 points  Repeat phrase 10 points 0 points 6 points  Total Score 10 0 6    Immunizations Immunization History  Administered Date(s) Administered   PFIZER(Purple Top)SARS-COV-2 Vaccination 12/03/2019, 12/29/2019, 08/02/2020   Pneumococcal Polysaccharide-23 02/21/2020   Tdap 10/18/2010    TDAP status: Up to date  Flu Vaccine status: Declined, Education has been provided regarding the importance of this vaccine but patient still declined. Advised may receive this vaccine at local pharmacy or Health Dept. Aware to provide a copy of the vaccination record if obtained from local pharmacy or Health Dept. Verbalized acceptance and understanding.  Pneumococcal vaccine status: Up to date  Covid-19 vaccine status: Information provided on how to obtain vaccines.   Qualifies for Shingles Vaccine? Yes   Zostavax completed No   Shingrix Completed?: No.    Education has been provided regarding the importance of this vaccine. Patient has been advised to call insurance company to determine out of pocket expense if they have not yet received this vaccine. Advised may also receive vaccine at local pharmacy or Health Dept. Verbalized acceptance and understanding.  Screening Tests Health Maintenance  Topic Date Due   Hepatitis C Screening  07/09/2021 (Originally 10/03/1959)   COVID-19 Vaccine (4 - Booster for [employer] series) 07/21/2021 (Originally 12/03/2020)   Zoster Vaccines- Shingrix (1 of 2) 10/04/2021 (Originally 10/03/1991)   TETANUS/TDAP  03/09/2022 (Originally 10/18/2020)   INFLUENZA VACCINE  07/09/2027  (Originally 05/18/2021)   OPHTHALMOLOGY EXAM  08/20/2021   FOOT EXAM  08/21/2021   HEMOGLOBIN A1C  09/09/2021   HPV VACCINES  Aged Out    Health Maintenance  There are no preventive care reminders to  display for this patient.  Colorectal cancer screening: No longer required.   Lung Cancer Screening: (Low Dose CT Chest recommended if Age 11-80 years, 30 pack-year currently smoking OR have quit w/in 15years.) does not qualify.     Additional Screening:  Hepatitis C Screening: does qualify; Not Completed   Vision Screening: Recommended annual ophthalmology exams for early detection of glaucoma and other disorders of the eye. Is the patient up to date with their annual eye exam?  Yes  Who is the provider or what is the name of the office in which the patient attends annual eye exams? Dr. Herbert Deaner If pt is not established with a provider, would they like to be referred to a provider to establish care? No .   Dental Screening: Recommended annual dental exams for proper oral hygiene  Community Resource Referral / Chronic Care Management: CRR required this visit?  No   CCM required this visit?  No      Plan:     I have personally reviewed and noted the following in the patient's chart:   Medical and social history Use of alcohol, tobacco or illicit drugs  Current medications and supplements including opioid prescriptions. Patient is not currently taking opioid prescriptions. Functional ability and status Nutritional status Physical activity Advanced directives List of other physicians Hospitalizations, surgeries, and ER visits in previous 12 months Vitals Screenings to include cognitive, depression, and falls Referrals and appointments  In addition, I have reviewed and discussed with patient certain preventive protocols, quality metrics, and best practice recommendations. A written personalized care plan for preventive services as well as general preventive health  recommendations were provided to patient.     Cannon K[place], [place]   07/05/2021   Nurse Notes: 12 minutes non face to face   Mr. Oyster , Thank you for taking time to come for your Medicare Wellness Visit. I appreciate your ongoing commitment to your health goals. Please review the following plan we discussed and let me know if I can assist you in the future.   These are the goals we discussed:  Goals      Increase water intake     Patient will try to increase his daily water intake     Patient Stated     Patient states want to continue to loose.        This is a list of the screening recommended for you and due dates:  Health Maintenance  Topic Date Due   Hepatitis C Screening: USPSTF Recommendation to screen - Ages 18-79 yo.  07/09/2021*   COVID-19 Vaccine (4 - Booster for Pfizer series) 07/21/2021*   Zoster (Shingles) Vaccine (1 of 2) 10/04/2021*   Tetanus Vaccine  03/09/2022*   Flu Shot  07/09/2027*   Eye exam for diabetics  08/20/2021   Complete foot exam   08/21/2021   Hemoglobin A1C  09/09/2021   HPV Vaccine  Aged Out  *Topic was postponed. The date shown is not the original due date.

## 2021-07-05 NOTE — Patient Instructions (Signed)
Health Maintenance, Male Adopting a healthy lifestyle and getting preventive care are important in promoting health and wellness. Ask your health care provider about: The right schedule for you to have regular tests and exams. Things you can do on your own to prevent diseases and keep yourself healthy. What should I know about diet, weight, and exercise? Eat a healthy diet  Eat a diet that includes plenty of vegetables, fruits, low-fat dairy products, and lean protein. Do not eat a lot of foods that are high in solid fats, added sugars, or sodium. Maintain a healthy weight Body mass index (BMI) is a measurement that can be used to identify possible weight problems. It estimates body fat based on height and weight. Your health care provider can help determine your BMI and help you achieve or maintain a healthy weight. Get regular exercise Get regular exercise. This is one of the most important things you can do for your health. Most adults should: Exercise for at least 150 minutes each week. The exercise should increase your heart rate and make you sweat (moderate-intensity exercise). Do strengthening exercises at least twice a week. This is in addition to the moderate-intensity exercise. Spend less time sitting. Even light physical activity can be beneficial. Watch cholesterol and blood lipids Have your blood tested for lipids and cholesterol at 80 years of age, then have this test every 5 years. You may need to have your cholesterol levels checked more often if: Your lipid or cholesterol levels are high. You are older than 80 years of age. You are at high risk for heart disease. What should I know about cancer screening? Many types of cancers can be detected early and may often be prevented. Depending on your health history and family history, you may need to have cancer screening at various ages. This may include screening for: Colorectal cancer. Prostate cancer. Skin cancer. Lung  cancer. What should I know about heart disease, diabetes, and high blood pressure? Blood pressure and heart disease High blood pressure causes heart disease and increases the risk of stroke. This is more likely to develop in people who have high blood pressure readings, are of African descent, or are overweight. Talk with your health care provider about your target blood pressure readings. Have your blood pressure checked: Every 3-5 years if you are 18-39 years of age. Every year if you are 40 years old or older. If you are between the ages of 65 and 75 and are a current or former smoker, ask your health care provider if you should have a one-time screening for abdominal aortic aneurysm (AAA). Diabetes Have regular diabetes screenings. This checks your fasting blood sugar level. Have the screening done: Once every three years after age 45 if you are at a normal weight and have a low risk for diabetes. More often and at a younger age if you are overweight or have a high risk for diabetes. What should I know about preventing infection? Hepatitis B If you have a higher risk for hepatitis B, you should be screened for this virus. Talk with your health care provider to find out if you are at risk for hepatitis B infection. Hepatitis C Blood testing is recommended for: Everyone born from 1945 through 1965. Anyone with known risk factors for hepatitis C. Sexually transmitted infections (STIs) You should be screened each year for STIs, including gonorrhea and chlamydia, if: You are sexually active and are younger than 80 years of age. You are older than 80 years   of age and your health care provider tells you that you are at risk for this type of infection. Your sexual activity has changed since you were last screened, and you are at increased risk for chlamydia or gonorrhea. Ask your health care provider if you are at risk. Ask your health care provider about whether you are at high risk for HIV.  Your health care provider may recommend a prescription medicine to help prevent HIV infection. If you choose to take medicine to prevent HIV, you should first get tested for HIV. You should then be tested every 3 months for as long as you are taking the medicine. Follow these instructions at home: Lifestyle Do not use any products that contain nicotine or tobacco, such as cigarettes, e-cigarettes, and chewing tobacco. If you need help quitting, ask your health care provider. Do not use street drugs. Do not share needles. Ask your health care provider for help if you need support or information about quitting drugs. Alcohol use Do not drink alcohol if your health care provider tells you not to drink. If you drink alcohol: Limit how much you have to 0-2 drinks a day. Be aware of how much alcohol is in your drink. In the [place] one drink equals one 12 oz bottle of beer (355 mL), one 5 oz glass of wine (148 mL), or one 1 oz glass of hard liquor (44 mL). General instructions Schedule regular health, dental, and eye exams. Stay current with your vaccines. Tell your health care provider if: You often feel depressed. You have ever been abused or do not feel safe at home. Summary Adopting a healthy lifestyle and getting preventive care are important in promoting health and wellness. Follow your health care provider's instructions about healthy diet, exercising, and getting tested or screened for diseases. Follow your health care provider's instructions on monitoring your cholesterol and blood pressure. This information is not intended to replace advice given to you by your health care provider. Make sure you discuss any questions you have with your health care provider. Document Revised: 12/12/2020 Document Reviewed: 09/27/2018 Elsevier Patient Education  2022 Elsevier Inc.  

## 2021-07-06 ENCOUNTER — Other Ambulatory Visit: Payer: Self-pay

## 2021-07-06 ENCOUNTER — Encounter: Payer: Self-pay | Admitting: Emergency Medicine

## 2021-07-06 ENCOUNTER — Ambulatory Visit (INDEPENDENT_AMBULATORY_CARE_PROVIDER_SITE_OTHER): Payer: Medicare Other | Admitting: Emergency Medicine

## 2021-07-06 VITALS — BP 128/80 | HR 88 | Temp 98.7°F | Ht 65.0 in | Wt 159.0 lb

## 2021-07-06 DIAGNOSIS — E1169 Type 2 diabetes mellitus with other specified complication: Secondary | ICD-10-CM

## 2021-07-06 DIAGNOSIS — Z23 Encounter for immunization: Secondary | ICD-10-CM | POA: Diagnosis not present

## 2021-07-06 DIAGNOSIS — E785 Hyperlipidemia, unspecified: Secondary | ICD-10-CM | POA: Diagnosis not present

## 2021-07-06 DIAGNOSIS — E1159 Type 2 diabetes mellitus with other circulatory complications: Secondary | ICD-10-CM

## 2021-07-06 DIAGNOSIS — I152 Hypertension secondary to endocrine disorders: Secondary | ICD-10-CM | POA: Diagnosis not present

## 2021-07-06 DIAGNOSIS — R21 Rash and other nonspecific skin eruption: Secondary | ICD-10-CM

## 2021-07-06 MED ORDER — TRIAMCINOLONE ACETONIDE 0.1 % EX CREA: 1.0000 "application " | TOPICAL_CREAM | Freq: Two times a day (BID) | CUTANEOUS | 0 refills | Status: DC

## 2021-07-06 NOTE — Assessment & Plan Note (Addendum)
Well-controlled hypertension.  Continue Accupril 20 mg and amlodipine 10 mg daily. Dietary approaches to stop hypertension discussed. Well-controlled diabetes with hemoglobin A1c of 6.5.  Continue glimepiride 2 mg twice a day. Diet and nutrition discussed. Follow-up in 6 months.

## 2021-07-06 NOTE — Patient Instructions (Signed)

## 2021-07-06 NOTE — Assessment & Plan Note (Signed)
Diet and nutrition discussed.  Continue atorvastatin 20 mg daily. The 10-year ASCVD risk score (Arnett DK, et al., 2019) is: 38.4%   Values used to calculate the score:     Age: 80 years     Sex: Male     Is Non-Hispanic African American: Yes     Diabetic: Yes     Tobacco smoker: No     Systolic Blood Pressure: 0000000 mmHg     Is BP treated: Yes     HDL Cholesterol: 59.6 mg/dL     Total Cholesterol: 165 mg/dL

## 2021-07-06 NOTE — Progress Notes (Signed)
Tony Price 80 y.o.   Chief Complaint  Patient presents with   Hypertension    Follow up    HISTORY OF PRESENT ILLNESS: This is a 80 y.o. male with history of hypertension here for follow-up.  On Accupril 20 mg amlodipine 10 mg daily. BP Readings from Last 3 Encounters:  07/06/21 128/80  03/09/21 (!) 146/82  08/21/20 126/72    History of diabetes on glimepiride 2 mg twice a day. Lab Results  Component Value Date   HGBA1C 6.5 (A) 03/09/2021    Also complaining of itchy rash to suprapubic area. No other complaints or medical concerns today.  Hypertension Pertinent negatives include no chest pain, headaches, palpitations or shortness of breath.    Prior to Admission medications   Medication Sig Start Date End Date Taking? Authorizing Provider  amLODipine (NORVASC) 10 MG tablet TAKE 1 TABLET BY MOUTH EVERY DAY 03/26/21  Yes Bethania Schlotzhauer, Ines Bloomer, MD  amLODipine (NORVASC) 5 MG tablet 1 tablet   Yes [provider]  Ascorbic Acid (VITAMIN C) 500 MG CAPS See admin instructions.   Yes [provider]  aspirin 81 MG EC tablet 1 tablet   Yes [provider]  atorvastatin (LIPITOR) 20 MG tablet TAKE 1 TABLET BY MOUTH EVERY DAY 02/13/21  Yes [name], MD  blood glucose meter kit and supplies KIT 1 touch ultra meter.   Check blood sugar once daily E11.9 07/14/17  Yes [name], MD  Blood Glucose Monitoring Suppl (ONE TOUCH ULTRA SYSTEM KIT) w/Device KIT Test blood sugar once daily. Dx: E11.9 07/01/16  Yes [name], MD  carboxymethylcellulose (REFRESH PLUS) 0.5 % SOLN 1 drop 2 (two) times daily as needed. Into both eyes   Yes [provider]  glimepiride (AMARYL) 2 MG tablet TAKE 1 TABLET BY MOUTH 2 TIMES DAILY. 03/26/21  Yes Eastin Swing, Ines Bloomer, MD  glucose blood test strip Test blood sugar once daily. Dx: E11.9 07/04/17  Yes [name], MD  ipratropium (ATROVENT) 0.06 % nasal spray Place 1-2 sprays into both nostrils 3  (three) times daily. As needed for nasal congestion 09/23/20  Yes [name], MD  Lancets [facility] ULTRASOFT) lancets Test blood sugar once daily. Dx: E11.9 07/04/17  Yes [name], MD  latanoprost (XALATAN) 0.005 % ophthalmic solution 1 drop at bedtime. 08/31/19  Yes [provider]  neomycin-polymyxin b-dexamethasone (MAXITROL) 3.5-10000-0.1 OINT SMARTSIG:Topical 06/26/20  Yes [provider]  quinapril (ACCUPRIL) 20 MG tablet TAKE 1 TABLET BY MOUTH EVERY DAY 02/13/21  Yes [name], MD    No Known Allergies  Patient Active Problem List   Diagnosis Date Noted   Personal history of colonic polyps 11/04/2020   Diabetic retinopathy ([place]) 03/31/2016   DM (diabetes mellitus), type 2 ([place]) 11/17/2014   Hypertension associated with diabetes ([place]) 11/17/2014    Past Medical History:  Diagnosis Date   Chest pain    Diabetes mellitus    Hypercholesterolemia    Hypertension    Kidney stones     Past Surgical History:  Procedure Laterality Date   CATARACT EXTRACTION     EYE SURGERY     PROSTATE BIOPSY     ROTATOR CUFF REPAIR     Right    Social History   Socioeconomic History   Marital status: Divorced    Spouse name: Not on file   Number of children: 3   Years of education: Not on file   Highest education level: Not  on file  Occupational History   Not on file  Tobacco Use   Smoking status: Former    Years: 2.00    Types: Cigarettes   Smokeless tobacco: Never  Substance and Sexual Activity   Alcohol use: No    Alcohol/week: 0.0 standard drinks   Drug use: No   Sexual activity: Not on file  Other Topics Concern   Not on file  Social History Narrative   Not on file   Social Determinants of Health   Financial Resource Strain: Low Risk    Difficulty of Paying Living Expenses: Not hard at all  Food Insecurity: No Food Insecurity   Worried About Charity fundraiser in the Last Year: Never true   [place] in the Last Year:  Never true  Transportation Needs: No Transportation Needs   Lack of Transportation (Medical): No   Lack of Transportation (Non-Medical): No  Physical Activity: Sufficiently Active   Days of Exercise per Week: 7 days   Minutes of Exercise per Session: 60 min  Stress: No Stress Concern Present   Feeling of Stress : Not at all  Social Connections: Moderately Isolated   Frequency of Communication with Friends and Family: More than three times a week   Frequency of Social Gatherings with Friends and Family: Twice a week   Attends Religious Services: More than 4 times per year   Active Member of [employer] or Organizations: No   Attends Archivist Meetings: Never   Marital Status: Widowed  Human resources officer Violence: Not At Risk   Fear of Current or Ex-Partner: No   Emotionally Abused: No   Physically Abused: No   Sexually Abused: No    Family History  Family history unknown: Yes     Review of Systems  Constitutional: Negative.  Negative for chills and fever.  HENT: Negative.  Negative for congestion and sore throat.   Respiratory: Negative.  Negative for cough and shortness of breath.   Cardiovascular: Negative.  Negative for chest pain and palpitations.  Gastrointestinal:  Negative for abdominal pain, diarrhea, nausea and vomiting.  Genitourinary: Negative.  Negative for dysuria and hematuria.  Skin:  Positive for itching and rash.       Suprapubic area  Neurological: Negative.  Negative for dizziness and headaches.  All other systems reviewed and are negative.  Today's Vitals   07/06/21 1418  BP: 128/80  Pulse: 88  Temp: 98.7 F (37.1 C)  TempSrc: Oral  SpO2: 96%  Weight: 159 lb (72.1 kg)  Height: _0  (1.651 m)   Body mass index is 26.46 kg/m. Wt Readings from Last 3 Encounters:  07/06/21 159 lb (72.1 kg)  03/09/21 168 lb (76.2 kg)  08/21/20 167 lb (75.8 kg)    Physical Exam Vitals reviewed.  Constitutional:      Appearance: Normal appearance.  HENT:      Head: Normocephalic.  Eyes:     Extraocular Movements: Extraocular movements intact.     Conjunctiva/sclera: Conjunctivae normal.     Pupils: Pupils are equal, round, and reactive to light.  Cardiovascular:     Rate and Rhythm: Normal rate and regular rhythm.     Heart sounds: Murmur (Aortic systolic murmur 3/6) heard.  Pulmonary:     Effort: Pulmonary effort is normal.     Breath sounds: Normal breath sounds.  Abdominal:     Palpations: Abdomen is soft.     Tenderness: There is no abdominal tenderness.  Musculoskeletal:  Cervical back: Normal range of motion and neck supple.  Skin:    General: Skin is warm and dry.     Capillary Refill: Capillary refill takes less than 2 seconds.     Findings: Rash (Dry dermatitis to suprapubic area) present.  Neurological:     General: No focal deficit present.     Mental Status: He is alert and oriented to person, place, and time.  Psychiatric:        Mood and Affect: Mood normal.        Behavior: Behavior normal.     ASSESSMENT & PLAN: Hypertension associated with diabetes ([place]) Well-controlled hypertension.  Continue Accupril 20 mg and amlodipine 10 mg daily. Dietary approaches to stop hypertension discussed. Well-controlled diabetes with hemoglobin A1c of 6.5.  Continue glimepiride 2 mg twice a day. Diet and nutrition discussed. Follow-up in 6 months.  Dyslipidemia associated with type 2 diabetes mellitus ([place]) Diet and nutrition discussed.  Continue atorvastatin 20 mg daily. The 10-year ASCVD risk score (Arnett DK, et al., 2019) is: 38.4%   Values used to calculate the score:     Age: 48 years     Sex: Male     Is Non-Hispanic African American: Yes     Diabetic: Yes     Tobacco smoker: No     Systolic Blood Pressure: 203 mmHg     Is BP treated: Yes     HDL Cholesterol: 59.6 mg/dL     Total Cholesterol: 165 mg/dL  Jemel was seen today for hypertension.  Diagnoses and all orders for this visit:  Hypertension  associated with diabetes ([place])  Need for influenza vaccination -     Flu Vaccine QUAD High Dose(Fluad)  Skin rash -     triamcinolone cream (KENALOG) 0.1 %; Apply 1 application topically 2 (two) times daily.  Dyslipidemia associated with type 2 diabetes mellitus ([place])  Patient Instructions  Hypertension, Adult High blood pressure (hypertension) is when the force of blood pumping through the arteries is too strong. The arteries are the blood vessels that carry blood from the heart throughout the body. Hypertension forces the heart to work harder to pump blood and may cause arteries to become narrow or stiff. Untreated or uncontrolled hypertension can cause a heart attack, heart failure, a stroke, kidney disease, and other problems. A blood pressure reading consists of a higher number over a lower number. Ideally, your blood pressure should be below 120/80. The first ("top") number is called the systolic pressure. It is a measure of the pressure in your arteries as your heart beats. The second ("bottom") number is called the diastolic pressure. It is a measure of the pressure in your arteries as the heart relaxes. What are the causes? The exact cause of this condition is not known. There are some conditions that result in or are related to high blood pressure. What increases the risk? Some risk factors for high blood pressure are under your control. The following factors may make you more likely to develop this condition: Smoking. Having type 2 diabetes mellitus, high cholesterol, or both. Not getting enough exercise or physical activity. Being overweight. Having too much fat, sugar, calories, or salt (sodium) in your diet. Drinking too much alcohol. Some risk factors for high blood pressure may be difficult or impossible to change. Some of these factors include: Having chronic kidney disease. Having a family history of high blood pressure. Age. Risk increases with age. Race. You may be at  higher risk if you  are African American. Gender. Men are at higher risk than women before age 44. After age 12, women are at higher risk than men. Having obstructive sleep apnea. Stress. What are the signs or symptoms? High blood pressure may not cause symptoms. Very high blood pressure (hypertensive crisis) may cause: Headache. Anxiety. Shortness of breath. Nosebleed. Nausea and vomiting. Vision changes. Severe chest pain. Seizures. How is this diagnosed? This condition is diagnosed by measuring your blood pressure while you are seated, with your arm resting on a flat surface, your legs uncrossed, and your feet flat on the floor. The cuff of the blood pressure monitor will be placed directly against the skin of your upper arm at the level of your heart. It should be measured at least twice using the same arm. Certain conditions can cause a difference in blood pressure between your right and left arms. Certain factors can cause blood pressure readings to be lower or higher than normal for a short period of time: When your blood pressure is higher when you are in a health care provider's office than when you are at home, this is called white coat hypertension. Most people with this condition do not need medicines. When your blood pressure is higher at home than when you are in a health care provider's office, this is called masked hypertension. Most people with this condition may need medicines to control blood pressure. If you have a high blood pressure reading during one visit or you have normal blood pressure with other risk factors, you may be asked to: Return on a different day to have your blood pressure checked again. Monitor your blood pressure at home for 1 week or longer. If you are diagnosed with hypertension, you may have other blood or imaging tests to help your health care provider understand your overall risk for other conditions. How is this treated? This condition is treated  by making healthy lifestyle changes, such as eating healthy foods, exercising more, and reducing your alcohol intake. Your health care provider may prescribe medicine if lifestyle changes are not enough to get your blood pressure under control, and if: Your systolic blood pressure is above 130. Your diastolic blood pressure is above 80. Your personal target blood pressure may vary depending on your medical conditions, your age, and other factors. Follow these instructions at home: Eating and drinking  Eat a diet that is high in fiber and potassium, and low in sodium, added sugar, and fat. An example eating plan is called the DASH (Dietary Approaches to Stop Hypertension) diet. To eat this way: Eat plenty of fresh fruits and vegetables. Try to fill one half of your plate at each meal with fruits and vegetables. Eat whole grains, such as whole-wheat pasta, brown rice, or whole-grain bread. Fill about one fourth of your plate with whole grains. Eat or drink low-fat dairy products, such as skim milk or low-fat yogurt. Avoid fatty cuts of meat, processed or cured meats, and poultry with skin. Fill about one fourth of your plate with lean proteins, such as fish, chicken without skin, beans, eggs, or tofu. Avoid pre-made and processed foods. These tend to be higher in sodium, added sugar, and fat. Reduce your daily sodium intake. Most people with hypertension should eat less than 1,500 mg of sodium a day. Do not drink alcohol if: Your health care provider tells you not to drink. You are pregnant, may be pregnant, or are planning to become pregnant. If you drink alcohol: Limit how much you  use to: 0-1 drink a day for women. 0-2 drinks a day for men. Be aware of how much alcohol is in your drink. In the [place] one drink equals one 12 oz bottle of beer (355 mL), one 5 oz glass of wine (148 mL), or one 1 oz glass of hard liquor (44 mL). Lifestyle  Work with your health care provider to maintain a  healthy body weight or to lose weight. Ask what an ideal weight is for you. Get at least 30 minutes of exercise most days of the week. Activities may include walking, swimming, or biking. Include exercise to strengthen your muscles (resistance exercise), such as Pilates or lifting weights, as part of your weekly exercise routine. Try to do these types of exercises for 30 minutes at least 3 days a week. Do not use any products that contain nicotine or tobacco, such as cigarettes, e-cigarettes, and chewing tobacco. If you need help quitting, ask your health care provider. Monitor your blood pressure at home as told by your health care provider. Keep all follow-up visits as told by your health care provider. This is important. Medicines Take over-the-counter and prescription medicines only as told by your health care provider. Follow directions carefully. Blood pressure medicines must be taken as prescribed. Do not skip doses of blood pressure medicine. Doing this puts you at risk for problems and can make the medicine less effective. Ask your health care provider about side effects or reactions to medicines that you should watch for. Contact a health care provider if you: Think you are having a reaction to a medicine you are taking. Have headaches that keep coming back (recurring). Feel dizzy. Have swelling in your ankles. Have trouble with your vision. Get help right away if you: Develop a severe headache or confusion. Have unusual weakness or numbness. Feel faint. Have severe pain in your chest or abdomen. Vomit repeatedly. Have trouble breathing. Summary Hypertension is when the force of blood pumping through your arteries is too strong. If this condition is not controlled, it may put you at risk for serious complications. Your personal target blood pressure may vary depending on your medical conditions, your age, and other factors. For most people, a normal blood pressure is less than  120/80. Hypertension is treated with lifestyle changes, medicines, or a combination of both. Lifestyle changes include losing weight, eating a healthy, low-sodium diet, exercising more, and limiting alcohol. This information is not intended to replace advice given to you by your health care provider. Make sure you discuss any questions you have with your health care provider. Document Revised: 06/14/2018 Document Reviewed: 06/14/2018 Elsevier Patient Education  2022 [place], MD [facility]

## 2021-07-13 ENCOUNTER — Other Ambulatory Visit: Payer: Self-pay | Admitting: Emergency Medicine

## 2021-07-13 DIAGNOSIS — R21 Rash and other nonspecific skin eruption: Secondary | ICD-10-CM

## 2021-07-15 ENCOUNTER — Other Ambulatory Visit: Payer: Self-pay

## 2021-07-15 ENCOUNTER — Encounter (HOSPITAL_COMMUNITY): Payer: Self-pay | Admitting: *Deleted

## 2021-07-15 ENCOUNTER — Emergency Department (HOSPITAL_COMMUNITY)
Admission: EM | Admit: 2021-07-15 | Discharge: 2021-07-15 | Disposition: A | Discharge status: Home / Self Care | Payer: Medicare Other | Attending: Emergency Medicine | Admitting: Emergency Medicine

## 2021-07-15 DIAGNOSIS — E11319 Type 2 diabetes mellitus with unspecified diabetic retinopathy without macular edema: Secondary | ICD-10-CM | POA: Insufficient documentation

## 2021-07-15 DIAGNOSIS — R21 Rash and other nonspecific skin eruption: Secondary | ICD-10-CM | POA: Diagnosis not present

## 2021-07-15 DIAGNOSIS — Z7984 Long term (current) use of oral hypoglycemic drugs: Secondary | ICD-10-CM | POA: Insufficient documentation

## 2021-07-15 DIAGNOSIS — I10 Essential (primary) hypertension: Secondary | ICD-10-CM | POA: Insufficient documentation

## 2021-07-15 DIAGNOSIS — Z87891 Personal history of nicotine dependence: Secondary | ICD-10-CM | POA: Diagnosis not present

## 2021-07-15 DIAGNOSIS — Z79899 Other long term (current) drug therapy: Secondary | ICD-10-CM | POA: Insufficient documentation

## 2021-07-15 DIAGNOSIS — Z7982 Long term (current) use of aspirin: Secondary | ICD-10-CM | POA: Insufficient documentation

## 2021-07-15 LAB — CBG MONITORING, ED: Glucose-Capillary: 128 mg/dL — ABNORMAL HIGH (ref 70–99)

## 2021-07-15 NOTE — ED Triage Notes (Signed)
Pt complains of rash to groin area, was prescribed kenalog cream 2 weeks ago for it, rash has not improved.

## 2021-07-15 NOTE — ED Provider Notes (Signed)
[facility] DEPT Provider Note   CSN: [id number] Arrival date & time: 07/15/21  1023     History Chief Complaint  Patient presents with   Rash    Tony Price is a 80 y.o. male.  Patient presents ER chief complaint of itchiness and a rash to the groin area ongoing for about 2 weeks.  He saw his primary care doctor recently and was given topical antifungal cream which she states has not really been working well.  Otherwise denies any fevers or cough denies vomiting or diarrhea.  Denies any pain.      Past Medical History:  Diagnosis Date   Chest pain    Diabetes mellitus    Hypercholesterolemia    Hypertension    Kidney stones     Patient Active Problem List   Diagnosis Date Noted   Personal history of colonic polyps 11/04/2020   Diabetic retinopathy ([place]) 03/31/2016   Dyslipidemia associated with type 2 diabetes mellitus ([place]) 11/17/2014   Hypertension associated with diabetes ([place]) 11/17/2014    Past Surgical History:  Procedure Laterality Date   CATARACT EXTRACTION     EYE SURGERY     PROSTATE BIOPSY     ROTATOR CUFF REPAIR     Right       Family History  Family history unknown: Yes    Social History   Tobacco Use   Smoking status: Former    Years: 2.00    Types: Cigarettes   Smokeless tobacco: Never  Substance Use Topics   Alcohol use: No    Alcohol/week: 0.0 standard drinks   Drug use: No    Home Medications Prior to Admission medications   Medication Sig Start Date End Date Taking? Authorizing Provider  amLODipine (NORVASC) 10 MG tablet TAKE 1 TABLET BY MOUTH EVERY DAY 03/26/21   Horald Pollen, MD  Ascorbic Acid (VITAMIN C) 500 MG CAPS See admin instructions.    [provider]  aspirin 81 MG EC tablet 1 tablet    [provider]  atorvastatin (LIPITOR) 20 MG tablet TAKE 1 TABLET BY MOUTH EVERY DAY 02/13/21   [name], MD  blood glucose meter kit and supplies KIT 1 touch  ultra meter.   Check blood sugar once daily E11.9 07/14/17   [name], MD  Blood Glucose Monitoring Suppl (ONE TOUCH ULTRA SYSTEM KIT) w/Device KIT Test blood sugar once daily. Dx: E11.9 07/01/16   [name], MD  carboxymethylcellulose (REFRESH PLUS) 0.5 % SOLN 1 drop 2 (two) times daily as needed. Into both eyes    [provider]  glimepiride (AMARYL) 2 MG tablet TAKE 1 TABLET BY MOUTH 2 TIMES DAILY. 03/26/21   Horald Pollen, MD  glucose blood test strip Test blood sugar once daily. Dx: E11.9 07/04/17   [name], MD  ipratropium (ATROVENT) 0.06 % nasal spray Place 1-2 sprays into both nostrils 3 (three) times daily. As needed for nasal congestion 09/23/20   [name], MD  Lancets [facility] ULTRASOFT) lancets Test blood sugar once daily. Dx: E11.9 07/04/17   [name], MD  latanoprost (XALATAN) 0.005 % ophthalmic solution 1 drop at bedtime. 08/31/19   [provider]  neomycin-polymyxin b-dexamethasone (MAXITROL) 3.5-10000-0.1 OINT SMARTSIG:Topical 06/26/20   [provider]  quinapril (ACCUPRIL) 20 MG tablet TAKE 1 TABLET BY MOUTH EVERY DAY 02/13/21   [name], MD  triamcinolone cream (KENALOG) 0.1 % Apply 1 application topically 2 (two) times  daily. 07/06/21   Horald Pollen, MD    Allergies    Patient has no known allergies.  Review of Systems   Review of Systems  Constitutional:  Negative for fever.  HENT:  Negative for ear pain and sore throat.   Eyes:  Negative for pain.  Respiratory:  Negative for cough.   Cardiovascular:  Negative for chest pain.  Gastrointestinal:  Negative for abdominal pain.  Genitourinary:  Negative for flank pain.  Musculoskeletal:  Negative for back pain.  Skin:  Positive for rash. Negative for color change.  Neurological:  Negative for syncope.  All other systems reviewed and are negative.  Physical Exam Updated Vital Signs BP (!) 141/77 (BP Location: Right Arm)   Pulse  78   Temp 98.7 F (37.1 C) (Oral)   Resp 18   SpO2 100%   Physical Exam Constitutional:      Appearance: He is well-developed.  HENT:     Head: Normocephalic.     Nose: Nose normal.  Eyes:     Extraocular Movements: Extraocular movements intact.  Cardiovascular:     Rate and Rhythm: Normal rate.  Pulmonary:     Effort: Pulmonary effort is normal.  Skin:    Coloration: Skin is not jaundiced.     Comments: No significant skin lesion or rash or erythema noted.  No ulcerative lesions noted.  Neurological:     Mental Status: He is alert. Mental status is at baseline.    ED Results / Procedures / Treatments   Labs (all labs ordered are listed, but only abnormal results are displayed) Labs Reviewed  CBG MONITORING, ED - Abnormal; Notable for the following components:      Result Value   Glucose-Capillary 128 (*)    All other components within normal limits    EKG None  Radiology No results found.  Procedures Procedures   Medications Ordered in ED Medications - No data to display  ED Course  I have reviewed the triage vital signs and the nursing notes.  Pertinent labs & imaging results that were available during my care of the patient were reviewed by me and considered in my medical decision making (see chart for details).    MDM Rules/Calculators/A&P                           I was not able to visualize any significant skin abnormalities in the groin area.  Patient's wife was at bedside and I asked her for assistance in locating the rash in question, however she also states that everything looks normal to her.  Advised patient to continue the cream to keep the area dry and to wash daily.  Advised him to follow-up again with her primary care doctor as needed.  Bedside glucose check appears normal at 129.  Advising immediate return if he has fevers worsening redness worsening pain or any additional concerns to return back to the ER. Final Clinical Impression(s) /  ED Diagnoses Final diagnoses:  Rash    Rx / DC Orders ED Discharge Orders     None        Luna Fuse, MD 07/15/21 1142

## 2021-07-15 NOTE — Discharge Instructions (Addendum)
Return to the ER if you notice redness pain fevers or any additional concerns.

## 2021-07-20 ENCOUNTER — Other Ambulatory Visit: Payer: Self-pay | Admitting: Emergency Medicine

## 2021-07-20 DIAGNOSIS — R21 Rash and other nonspecific skin eruption: Secondary | ICD-10-CM

## 2021-07-22 DIAGNOSIS — E113293 Type 2 diabetes mellitus with mild nonproliferative diabetic retinopathy without macular edema, bilateral: Secondary | ICD-10-CM | POA: Diagnosis not present

## 2021-07-22 DIAGNOSIS — H26492 Other secondary cataract, left eye: Secondary | ICD-10-CM | POA: Diagnosis not present

## 2021-07-22 DIAGNOSIS — H35341 Macular cyst, hole, or pseudohole, right eye: Secondary | ICD-10-CM | POA: Diagnosis not present

## 2021-07-22 DIAGNOSIS — H402231 Chronic angle-closure glaucoma, bilateral, mild stage: Secondary | ICD-10-CM | POA: Diagnosis not present

## 2021-07-22 LAB — HM DIABETES EYE EXAM

## 2021-07-27 ENCOUNTER — Encounter: Payer: Self-pay | Admitting: Emergency Medicine

## 2021-07-27 ENCOUNTER — Ambulatory Visit (INDEPENDENT_AMBULATORY_CARE_PROVIDER_SITE_OTHER): Payer: Medicare Other | Admitting: Emergency Medicine

## 2021-07-27 ENCOUNTER — Other Ambulatory Visit: Payer: Self-pay

## 2021-07-27 VITALS — BP 128/60 | HR 71 | Ht 65.0 in | Wt 158.0 lb

## 2021-07-27 DIAGNOSIS — N138 Other obstructive and reflux uropathy: Secondary | ICD-10-CM | POA: Insufficient documentation

## 2021-07-27 DIAGNOSIS — E1159 Type 2 diabetes mellitus with other circulatory complications: Secondary | ICD-10-CM

## 2021-07-27 DIAGNOSIS — N400 Enlarged prostate without lower urinary tract symptoms: Secondary | ICD-10-CM | POA: Diagnosis not present

## 2021-07-27 DIAGNOSIS — E785 Hyperlipidemia, unspecified: Secondary | ICD-10-CM

## 2021-07-27 DIAGNOSIS — R21 Rash and other nonspecific skin eruption: Secondary | ICD-10-CM | POA: Diagnosis not present

## 2021-07-27 DIAGNOSIS — I152 Hypertension secondary to endocrine disorders: Secondary | ICD-10-CM

## 2021-07-27 DIAGNOSIS — N401 Enlarged prostate with lower urinary tract symptoms: Secondary | ICD-10-CM | POA: Insufficient documentation

## 2021-07-27 DIAGNOSIS — E1169 Type 2 diabetes mellitus with other specified complication: Secondary | ICD-10-CM | POA: Diagnosis not present

## 2021-07-27 HISTORY — DX: Benign prostatic hyperplasia with lower urinary tract symptoms: N13.8

## 2021-07-27 NOTE — Progress Notes (Signed)
Tony Price 80 y.o.   Chief Complaint  Patient presents with   Rash    F/u, pt states he feels good.    HISTORY OF PRESENT ILLNESS: This is a 80 y.o. male here for follow-up of rash from office visit 07/06/2021.  Doing much better. Has history of hypertension and diabetes.  Normal blood pressure readings at home.  Normal blood glucose at home. History of prostate enlargement with increased PSA.  Recently seen by urologist.   Rash Pertinent negatives include no congestion, cough, fever, shortness of breath, sore throat or vomiting.    Prior to Admission medications   Medication Sig Start Date End Date Taking? Authorizing Provider  amLODipine (NORVASC) 10 MG tablet TAKE 1 TABLET BY MOUTH EVERY DAY 03/26/21  Yes Abdurrahman Petersheim, Ines Bloomer, MD  Ascorbic Acid (VITAMIN C) 500 MG CAPS See admin instructions.   Yes [provider]  aspirin 81 MG EC tablet 1 tablet   Yes [provider]  atorvastatin (LIPITOR) 20 MG tablet TAKE 1 TABLET BY MOUTH EVERY DAY 02/13/21  Yes [name], MD  blood glucose meter kit and supplies KIT 1 touch ultra meter.   Check blood sugar once daily E11.9 07/14/17  Yes [name], MD  Blood Glucose Monitoring Suppl (ONE TOUCH ULTRA SYSTEM KIT) w/Device KIT Test blood sugar once daily. Dx: E11.9 07/01/16  Yes [name], MD  carboxymethylcellulose (REFRESH PLUS) 0.5 % SOLN 1 drop 2 (two) times daily as needed. Into both eyes   Yes [provider]  glimepiride (AMARYL) 2 MG tablet TAKE 1 TABLET BY MOUTH 2 TIMES DAILY. 03/26/21  Yes Vane Yapp, Ines Bloomer, MD  glucose blood test strip Test blood sugar once daily. Dx: E11.9 07/04/17  Yes [name], MD  ipratropium (ATROVENT) 0.06 % nasal spray Place 1-2 sprays into both nostrils 3 (three) times daily. As needed for nasal congestion 09/23/20  Yes [name], MD  Lancets [facility] ULTRASOFT) lancets Test blood sugar once daily. Dx: E11.9 07/04/17  Yes [name], MD   latanoprost (XALATAN) 0.005 % ophthalmic solution 1 drop at bedtime. 08/31/19  Yes [provider]  neomycin-polymyxin b-dexamethasone (MAXITROL) 3.5-10000-0.1 OINT SMARTSIG:Topical 06/26/20  Yes [provider]  quinapril (ACCUPRIL) 20 MG tablet TAKE 1 TABLET BY MOUTH EVERY DAY 02/13/21  Yes [name], MD  triamcinolone cream (KENALOG) 0.1 % APPLY TO AFFECTED AREA TWICE A DAY 07/20/21  Yes Horald Pollen, MD    No Known Allergies  Patient Active Problem List   Diagnosis Date Noted   Personal history of colonic polyps 11/04/2020   Diabetic retinopathy ([place]) 03/31/2016   Dyslipidemia associated with type 2 diabetes mellitus ([place]) 11/17/2014   Hypertension associated with diabetes ([place]) 11/17/2014    Past Medical History:  Diagnosis Date   Chest pain    Diabetes mellitus    Hypercholesterolemia    Hypertension    Kidney stones     Past Surgical History:  Procedure Laterality Date   CATARACT EXTRACTION     EYE SURGERY     PROSTATE BIOPSY     ROTATOR CUFF REPAIR     Right    Social History   Socioeconomic History   Marital status: Divorced    Spouse name: Not on file   Number of children: 3   Years of education: Not on file   Highest education level: Not on file  Occupational History   Not on file  Tobacco Use   Smoking  status: Former    Years: 2.00    Types: Cigarettes   Smokeless tobacco: Never  Substance and Sexual Activity   Alcohol use: No    Alcohol/week: 0.0 standard drinks   Drug use: No   Sexual activity: Not on file  Other Topics Concern   Not on file  Social History Narrative   Not on file   Social Determinants of Health   Financial Resource Strain: Low Risk    Difficulty of Paying Living Expenses: Not hard at all  Food Insecurity: No Food Insecurity   Worried About Charity fundraiser in the Last Year: Never true   [place] in the Last Year: Never true  Transportation Needs: No Transportation Needs   Lack  of Transportation (Medical): No   Lack of Transportation (Non-Medical): No  Physical Activity: Sufficiently Active   Days of Exercise per Week: 7 days   Minutes of Exercise per Session: 60 min  Stress: No Stress Concern Present   Feeling of Stress : Not at all  Social Connections: Moderately Isolated   Frequency of Communication with Friends and Family: More than three times a week   Frequency of Social Gatherings with Friends and Family: Twice a week   Attends Religious Services: More than 4 times per year   Active Member of [employer] or Organizations: No   Attends Archivist Meetings: Never   Marital Status: Widowed  Human resources officer Violence: Not At Risk   Fear of Current or Ex-Partner: No   Emotionally Abused: No   Physically Abused: No   Sexually Abused: No    Family History  Family history unknown: Yes     Review of Systems  Constitutional: Negative.  Negative for chills and fever.  HENT: Negative.  Negative for congestion and sore throat.   Respiratory: Negative.  Negative for cough and shortness of breath.   Cardiovascular: Negative.  Negative for chest pain and palpitations.  Gastrointestinal: Negative.  Negative for abdominal pain, nausea and vomiting.  Genitourinary: Negative.  Negative for dysuria, flank pain, frequency and hematuria.  Musculoskeletal:  Negative for myalgias.  Skin:  Positive for rash.  Neurological: Negative.  Negative for dizziness and headaches.  All other systems reviewed and are negative. Today's Vitals   07/27/21 0956  BP: 128/60  Pulse: 71  SpO2: 98%  Weight: 158 lb (71.7 kg)  Height: '5\' 5"'  (1.651 m)   Body mass index is 26.29 kg/m. Wt Readings from Last 3 Encounters:  07/27/21 158 lb (71.7 kg)  07/06/21 159 lb (72.1 kg)  03/09/21 168 lb (76.2 kg)     Physical Exam Vitals reviewed.  Constitutional:      Appearance: Normal appearance.  HENT:     Head: Normocephalic.  Eyes:     Extraocular Movements: Extraocular  movements intact.     Pupils: Pupils are equal, round, and reactive to light.  Cardiovascular:     Rate and Rhythm: Normal rate.  Pulmonary:     Effort: Pulmonary effort is normal.  Musculoskeletal:     Cervical back: No tenderness.  Skin:    General: Skin is warm and dry.     Capillary Refill: Capillary refill takes less than 2 seconds.     Findings: No rash.  Neurological:     General: No focal deficit present.     Mental Status: He is alert and oriented to person, place, and time.  Psychiatric:        Mood and Affect: Mood  normal.        Behavior: Behavior normal.     ASSESSMENT & PLAN: Clinically stable.  Rash much improved. Well-controlled hypertension. Well-controlled diabetes. Continue present medications.  No changes. No medical concerns identified during this visit. Follow-up in 6 months as already scheduled.  Problem List Items Addressed This Visit       Cardiovascular and Mediastinum   Hypertension associated with diabetes ([place])     Endocrine   Dyslipidemia associated with type 2 diabetes mellitus ([facility])     Genitourinary   Benign prostatic hyperplasia without lower urinary tract symptoms   Other Visit Diagnoses     Skin rash    -  Primary   Much improved      Patient Instructions  Health Maintenance After Age 23 After age 65, you are at a higher risk for certain long-term diseases and infections as well as injuries from falls. Falls are a major cause of broken bones and head injuries in people who are older than age 98. Getting regular preventive care can help to keep you healthy and well. Preventive care includes getting regular testing and making lifestyle changes as recommended by your health care provider. Talk with your health care provider about: Which screenings and tests you should have. A screening is a test that checks for a disease when you have no symptoms. A diet and exercise plan that is right for you. What should I know about screenings  and tests to prevent falls? Screening and testing are the best ways to find a health problem early. Early diagnosis and treatment give you the best chance of managing medical conditions that are common after age 53. Certain conditions and lifestyle choices may make you more likely to have a fall. Your health care provider may recommend: Regular vision checks. Poor vision and conditions such as cataracts can make you more likely to have a fall. If you wear glasses, make sure to get your prescription updated if your vision changes. Medicine review. Work with your health care provider to regularly review all of the medicines you are taking, including over-the-counter medicines. Ask your health care provider about any side effects that may make you more likely to have a fall. Tell your health care provider if any medicines that you take make you feel dizzy or sleepy. Osteoporosis screening. Osteoporosis is a condition that causes the bones to get weaker. This can make the bones weak and cause them to break more easily. Blood pressure screening. Blood pressure changes and medicines to control blood pressure can make you feel dizzy. Strength and balance checks. Your health care provider may recommend certain tests to check your strength and balance while standing, walking, or changing positions. Foot health exam. Foot pain and numbness, as well as not wearing proper footwear, can make you more likely to have a fall. Depression screening. You may be more likely to have a fall if you have a fear of falling, feel emotionally low, or feel unable to do activities that you used to do. Alcohol use screening. Using too much alcohol can affect your balance and may make you more likely to have a fall. What actions can I take to lower my risk of falls? General instructions Talk with your health care provider about your risks for falling. Tell your health care provider if: You fall. Be sure to tell your health care  provider about all falls, even ones that seem minor. You feel dizzy, sleepy, or off-balance. Take over-the-counter and prescription  medicines only as told by your health care provider. These include any supplements. Eat a healthy diet and maintain a healthy weight. A healthy diet includes low-fat dairy products, low-fat (lean) meats, and fiber from whole grains, beans, and lots of fruits and vegetables. Home safety Remove any tripping hazards, such as rugs, cords, and clutter. Install safety equipment such as grab bars in bathrooms and safety rails on stairs. Keep rooms and walkways well-lit. Activity  Follow a regular exercise program to stay fit. This will help you maintain your balance. Ask your health care provider what types of exercise are appropriate for you. If you need a cane or walker, use it as recommended by your health care provider. Wear supportive shoes that have nonskid soles. Lifestyle Do not drink alcohol if your health care provider tells you not to drink. If you drink alcohol, limit how much you have: 0-1 drink a day for women. 0-2 drinks a day for men. Be aware of how much alcohol is in your drink. In the [place] one drink equals one typical bottle of beer (12 oz), one-half glass of wine (5 oz), or one shot of hard liquor (1 oz). Do not use any products that contain nicotine or tobacco, such as cigarettes and e-cigarettes. If you need help quitting, ask your health care provider. Summary Having a healthy lifestyle and getting preventive care can help to protect your health and wellness after age 55. Screening and testing are the best way to find a health problem early and help you avoid having a fall. Early diagnosis and treatment give you the best chance for managing medical conditions that are more common for people who are older than age 81. Falls are a major cause of broken bones and head injuries in people who are older than age 12. Take precautions to prevent a fall  at home. Work with your health care provider to learn what changes you can make to improve your health and wellness and to prevent falls. This information is not intended to replace advice given to you by your health care provider. Make sure you discuss any questions you have with your health care provider. Document Revised: 12/12/2020 Document Reviewed: 09/19/2020 Elsevier Patient Education  2022 [place], MD [facility]

## 2021-07-27 NOTE — Patient Instructions (Signed)
Health Maintenance After Age 80 After age 80, you are at a higher risk for certain long-term diseases and infections as well as injuries from falls. Falls are a major cause of broken bones and head injuries in people who are older than age 80. Getting regular preventive care can help to keep you healthy and well. Preventive care includes getting regular testing and making lifestyle changes as recommended by your health care provider. Talk with your health care provider about: Which screenings and tests you should have. A screening is a test that checks for a disease when you have no symptoms. A diet and exercise plan that is right for you. What should I know about screenings and tests to prevent falls? Screening and testing are the best ways to find a health problem early. Early diagnosis and treatment give you the best chance of managing medical conditions that are common after age 80. Certain conditions and lifestyle choices may make you more likely to have a fall. Your health care provider may recommend: Regular vision checks. Poor vision and conditions such as cataracts can make you more likely to have a fall. If you wear glasses, make sure to get your prescription updated if your vision changes. Medicine review. Work with your health care provider to regularly review all of the medicines you are taking, including over-the-counter medicines. Ask your health care provider about any side effects that may make you more likely to have a fall. Tell your health care provider if any medicines that you take make you feel dizzy or sleepy. Osteoporosis screening. Osteoporosis is a condition that causes the bones to get weaker. This can make the bones weak and cause them to break more easily. Blood pressure screening. Blood pressure changes and medicines to control blood pressure can make you feel dizzy. Strength and balance checks. Your health care provider may recommend certain tests to check your strength and  balance while standing, walking, or changing positions. Foot health exam. Foot pain and numbness, as well as not wearing proper footwear, can make you more likely to have a fall. Depression screening. You may be more likely to have a fall if you have a fear of falling, feel emotionally low, or feel unable to do activities that you used to do. Alcohol use screening. Using too much alcohol can affect your balance and may make you more likely to have a fall. What actions can I take to lower my risk of falls? General instructions Talk with your health care provider about your risks for falling. Tell your health care provider if: You fall. Be sure to tell your health care provider about all falls, even ones that seem minor. You feel dizzy, sleepy, or off-balance. Take over-the-counter and prescription medicines only as told by your health care provider. These include any supplements. Eat a healthy diet and maintain a healthy weight. A healthy diet includes low-fat dairy products, low-fat (lean) meats, and fiber from whole grains, beans, and lots of fruits and vegetables. Home safety Remove any tripping hazards, such as rugs, cords, and clutter. Install safety equipment such as grab bars in bathrooms and safety rails on stairs. Keep rooms and walkways well-lit. Activity  Follow a regular exercise program to stay fit. This will help you maintain your balance. Ask your health care provider what types of exercise are appropriate for you. If you need a cane or walker, use it as recommended by your health care provider. Wear supportive shoes that have nonskid soles. Lifestyle Do not   drink alcohol if your health care provider tells you not to drink. If you drink alcohol, limit how much you have: 0-1 drink a day for women. 0-2 drinks a day for men. Be aware of how much alcohol is in your drink. In the [place] one drink equals one typical bottle of beer (12 oz), one-half glass of wine (5 oz), or one shot of  hard liquor (1 oz). Do not use any products that contain nicotine or tobacco, such as cigarettes and e-cigarettes. If you need help quitting, ask your health care provider. Summary Having a healthy lifestyle and getting preventive care can help to protect your health and wellness after age 80. Screening and testing are the best way to find a health problem early and help you avoid having a fall. Early diagnosis and treatment give you the best chance for managing medical conditions that are more common for people who are older than age 80. Falls are a major cause of broken bones and head injuries in people who are older than age 80. Take precautions to prevent a fall at home. Work with your health care provider to learn what changes you can make to improve your health and wellness and to prevent falls. This information is not intended to replace advice given to you by your health care provider. Make sure you discuss any questions you have with your health care provider. Document Revised: 12/12/2020 Document Reviewed: 09/19/2020 Elsevier Patient Education  2022 Elsevier Inc.  

## 2021-08-05 IMAGING — MR MR PROSTATE WO/W CM
12 series · 48 of 48 positions shown · IV contrast (multihance)
Comparison: 12/11/2015

CLINICAL DATA: Elevated PSA level of 14.90 on 03/20/2021. Prior
biopsy 11/04/2014 showed high-grade PIN in the right mid gland and
right lateral apex

EXAM:
MR PROSTATE WITHOUT AND WITH CONTRAST
TECHNIQUE: Multiplanar multisequence MRI images were obtained of the pelvis
centered about the prostate. Pre and post contrast images were
obtained.
CONTRAST:  15mL MULTIHANCE GADOBENATE DIMEGLUMINE 529 MG/ML IV SOLN

[Series 3: T2 · coronal · 3.0mm · 0.56mm/px · 1 of 27 slices shown (1 of 3)]
[im 1/27]
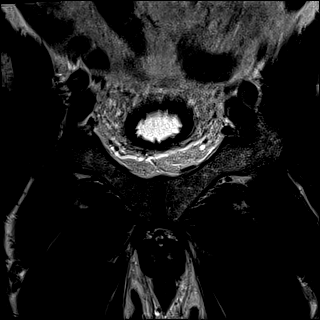

[Series 4: T1 · axial · 5.0mm · 1.25mm/px · 1 of 88 slices shown]
[im 1/88]
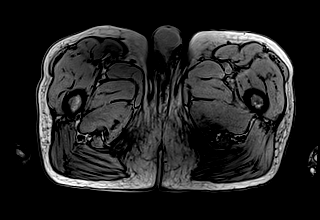

[Series 5: DWI · axial · 3.0mm · 1.75mm/px · 1 of 82 slices shown (1 of 3)]
[im 1/82]
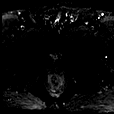

[Series 6: DWI · axial · 3.0mm · 1.75mm/px · 1 of 28 slices shown (2 of 3)]
[im 1/28]
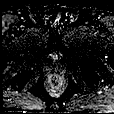

[Series 7: DWI · axial · 3.0mm · 1.75mm/px · 1 of 28 slices shown (3 of 3)]
[im 1/28]
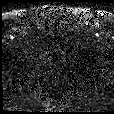

[Series 8: T2 · axial · 3.0mm · 0.56mm/px · 1 of 28 slices shown (2 of 3)]
[im 1/28]
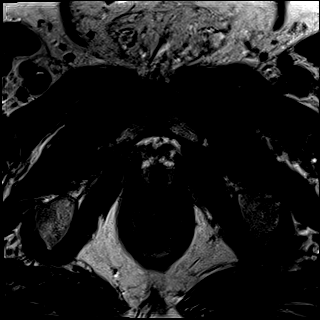

[Series 9: T2 · axial · 1.0mm · 1.04mm/px · z∈[-52,+27]mm · 2 of 80 slices shown (3 of 3)]
[im 1/80]
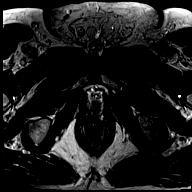
[im 80/80]
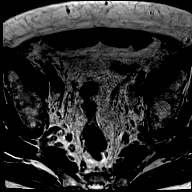

[Series 10: pre t1_twist_tra_dyn · axial · non-contrast · 3.5mm · 0.83mm/px · 1 of 24 slices shown]
[im 1/24]
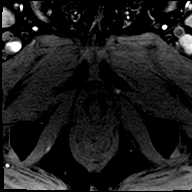

[Series 11: post t1_twist_tra_dyn-copy center · axial · non-contrast · 3.5mm · 0.83mm/px · z∈[-53,+27]mm · 18 of 720 slices shown]
[im 1/720]
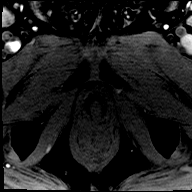
[im 43/720]
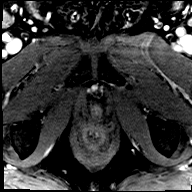
[im 85/720]
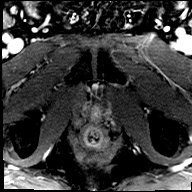
[im 127/720]
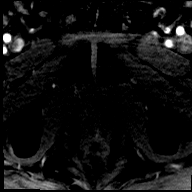
[im 170/720]
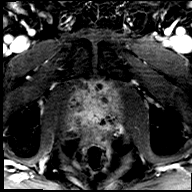
[im 212/720]
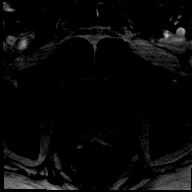
[im 254/720]
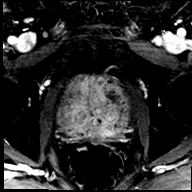
[im 297/720]
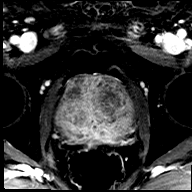
[im 339/720]
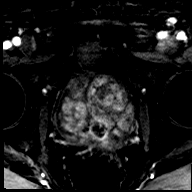
[im 381/720]
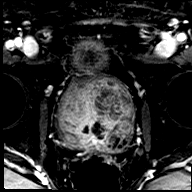
[im 423/720]
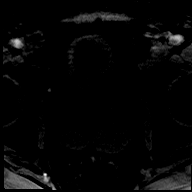
[im 466/720]
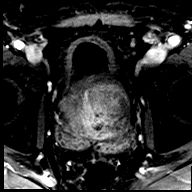
[im 508/720]
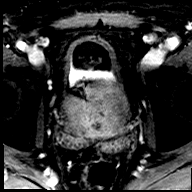
[im 550/720]
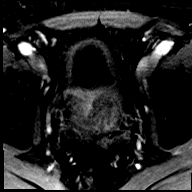
[im 593/720]
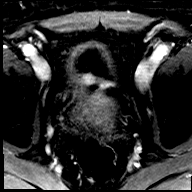
[im 635/720]
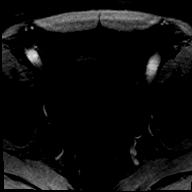
[im 677/720]
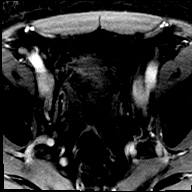
[im 720/720]
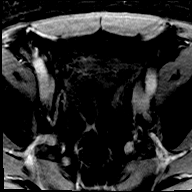

[Series 12: post t1_twist_tra_dyn-copy cent_sub · axial · 3.5mm · 0.83mm/px · z∈[-53,+27]mm · 17 of 696 slices shown]
[im 1/696]
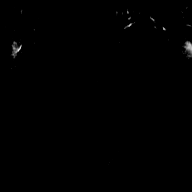
[im 44/696]
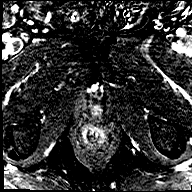
[im 87/696]
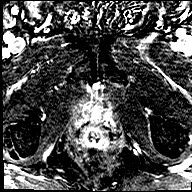
[im 131/696]
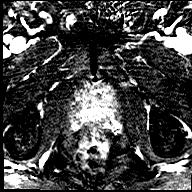
[im 174/696]
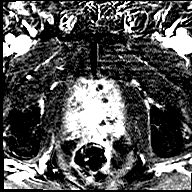
[im 218/696]
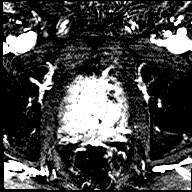
[im 261/696]
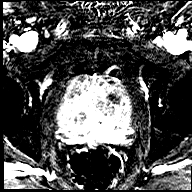
[im 305/696]
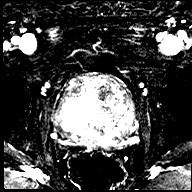
[im 348/696]
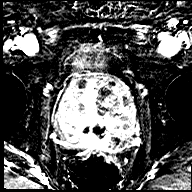
[im 391/696]
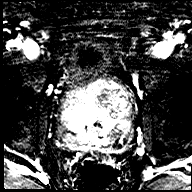
[im 435/696]
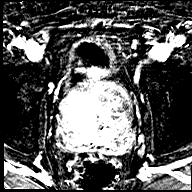
[im 478/696]
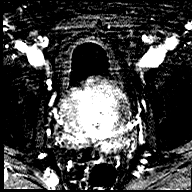
[im 522/696]
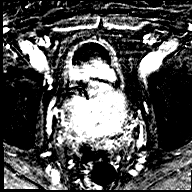
[im 565/696]
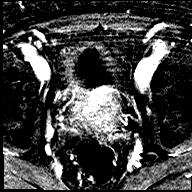
[im 609/696]
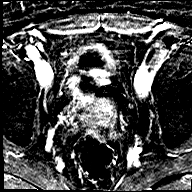
[im 652/696]
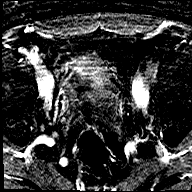
[im 696/696]
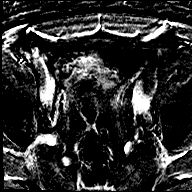

[Series 13: t1_vibe_dixon_tra_f · axial · 2.5mm · 0.91mm/px · z∈[-91,+107]mm · 2 of 80 slices shown]
[im 1/80]
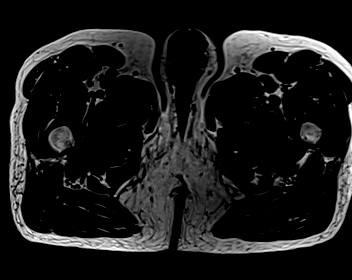
[im 80/80]
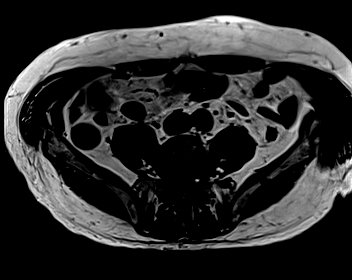

[Series 14: t1_vibe_dixon_tra_w · axial · 2.5mm · 0.91mm/px · z∈[-91,+107]mm · 2 of 80 slices shown]
[im 1/80]
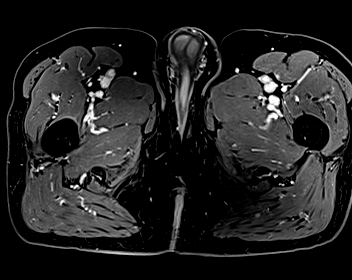
[im 80/80]
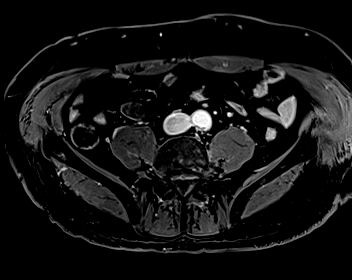

[48 of 48 positions shown; findings below may reference images not displayed]

FINDINGS: Prostate:

Region of interest # 1: Small PI-RADS category 3 lesion of the right
posterolateral peripheral zone at the apex with focally reduced T2
signal but no appreciable early enhancement. This measures
cubic cm (1.0 by 0.6 by 0.7 cm) and is shown for example on image 21
series 8.

Low T2 signal stranding and diffuse low-grade enhancement noted in
the peripheral zone, likely postinflammatory in considered PI-RADS
category 2.

A focus of accentuated signal on diffusion-weighted images
corresponds to a high T2 signal in the posterior transition zone on
the left on image 14 series 7, the lack of focal reduced T2 signal
indicates a likely benign lesion.

Encapsulated nodularity in the transition zone compatible with
benign prosthetic hypertrophy.

Volume: 3D volumetric analysis: Prostate volume 123.98 cubic cm (6.6
by 6.5 by 6.0 cm).

Transcapsular spread:  Absent

Seminal vesicle involvement: Absent

Neurovascular bundle involvement: Absent

Pelvic adenopathy: Absent

Bone metastasis: Absent

Other findings: No supplemental non-categorized findings.
IMPRESSION: 1. Small PI-RADS category 3 lesion of the right posterolateral
peripheral zone at the apex. Targeting data sent to [facility].
2. Benign prostatic hypertrophy, prostate volume 123.90 cubic cm.

## 2021-08-07 DIAGNOSIS — L281 Prurigo nodularis: Secondary | ICD-10-CM | POA: Diagnosis not present

## 2021-08-07 DIAGNOSIS — Z23 Encounter for immunization: Secondary | ICD-10-CM | POA: Diagnosis not present

## 2021-08-07 DIAGNOSIS — L810 Postinflammatory hyperpigmentation: Secondary | ICD-10-CM | POA: Diagnosis not present

## 2021-08-08 ENCOUNTER — Other Ambulatory Visit: Payer: Self-pay | Admitting: Family Medicine

## 2021-08-08 DIAGNOSIS — I10 Essential (primary) hypertension: Secondary | ICD-10-CM

## 2021-08-08 DIAGNOSIS — E785 Hyperlipidemia, unspecified: Secondary | ICD-10-CM

## 2021-08-14 ENCOUNTER — Other Ambulatory Visit: Payer: Self-pay

## 2021-08-14 DIAGNOSIS — E785 Hyperlipidemia, unspecified: Secondary | ICD-10-CM

## 2021-08-14 DIAGNOSIS — I10 Essential (primary) hypertension: Secondary | ICD-10-CM

## 2021-08-14 MED ORDER — ATORVASTATIN CALCIUM 20 MG PO TABS: 20.0000 mg | ORAL_TABLET | Freq: Every day | ORAL | 1 refills | Status: DC

## 2021-08-14 MED ORDER — QUINAPRIL HCL 20 MG PO TABS: 20.0000 mg | ORAL_TABLET | Freq: Every day | ORAL | 1 refills | Status: DC

## 2021-08-21 ENCOUNTER — Other Ambulatory Visit: Payer: Self-pay

## 2021-08-21 ENCOUNTER — Encounter (INDEPENDENT_AMBULATORY_CARE_PROVIDER_SITE_OTHER): Payer: Medicare Other | Admitting: Ophthalmology

## 2021-08-21 DIAGNOSIS — H35033 Hypertensive retinopathy, bilateral: Secondary | ICD-10-CM | POA: Diagnosis not present

## 2021-08-21 DIAGNOSIS — H43813 Vitreous degeneration, bilateral: Secondary | ICD-10-CM | POA: Diagnosis not present

## 2021-08-21 DIAGNOSIS — H35341 Macular cyst, hole, or pseudohole, right eye: Secondary | ICD-10-CM | POA: Diagnosis not present

## 2021-08-21 DIAGNOSIS — I10 Essential (primary) hypertension: Secondary | ICD-10-CM | POA: Diagnosis not present

## 2021-08-21 DIAGNOSIS — H35373 Puckering of macula, bilateral: Secondary | ICD-10-CM

## 2021-08-24 ENCOUNTER — Other Ambulatory Visit: Payer: Self-pay

## 2021-08-24 ENCOUNTER — Encounter: Payer: Self-pay | Admitting: Podiatry

## 2021-08-24 ENCOUNTER — Ambulatory Visit (INDEPENDENT_AMBULATORY_CARE_PROVIDER_SITE_OTHER): Payer: Medicare Other | Admitting: Podiatry

## 2021-08-24 DIAGNOSIS — M79675 Pain in left toe(s): Secondary | ICD-10-CM | POA: Diagnosis not present

## 2021-08-24 DIAGNOSIS — B351 Tinea unguium: Secondary | ICD-10-CM | POA: Diagnosis not present

## 2021-08-24 DIAGNOSIS — M79674 Pain in right toe(s): Secondary | ICD-10-CM

## 2021-08-24 DIAGNOSIS — L600 Ingrowing nail: Secondary | ICD-10-CM | POA: Diagnosis not present

## 2021-08-27 NOTE — Progress Notes (Signed)
  Subjective:  Patient ID: Tony Price, male    DOB: Jun 03, 1941,  MRN: [id number]  Tony Price presents to clinic today for preventative diabetic foot care and painful thick toenails that are difficult to trim. Pain interferes with ambulation. Aggravating factors include wearing enclosed shoe gear. Pain is relieved with periodic professional debridement.  Patient states blood glucose was 107 mg/dl today.    Patient states his right great toe is tender today.  PCP is Horald Pollen, MD , and last visit was 07/27/2021 No Known Allergies  Review of Systems: Negative except as noted in the HPI. Objective:   Constitutional Tony Price is a pleasant 80 y.o. African American male, WD, WN in NAD. AAO x 3.   Vascular Capillary refill time to digits immediate b/l. Palpable pedal pulses b/l LE. Pedal hair absent. No pain with calf compression b/l. Lower extremity skin temperature gradient within normal limits. No edema noted b/l LE. No cyanosis or clubbing noted.  Neurologic Normal speech. Oriented to person, place, and time. Protective sensation intact 5/5 intact bilaterally with 10g monofilament b/l.  Dermatologic Pedal integument with normal turgor, texture and tone b/l LE. No open wounds b/l. No interdigital macerations b/l. Toenails 1-5 left, R hallux, R 2nd toe, R 4th toe, and R 5th toe elongated, thickened, discolored with subungual debris. +Tenderness with dorsal palpation of nailplates. No hyperkeratotic or porokeratotic lesions present. Incurvated nailplate medial  border(s) R hallux.  Nail border hypertrophy absent. There is tenderness to palpation. Sign(s) of infection: no clinical signs of infection noted on examination today.. Anonychia noted R 3rd toe. Nailbed(s) epithelialized.   Orthopedic: Normal muscle strength 5/5 to all lower extremity muscle groups bilaterally. HAV with bunion deformity noted b/l LE.   Radiographs: None Assessment:   1. Pain due to  onychomycosis of toenails of both feet   2. Ingrown toenail without infection    Plan:  Patient was evaluated and treated and all questions answered. Consent given for treatment as described below: -Examined patient. -Continue diabetic foot care principles: inspect feet daily, monitor glucose as recommended by PCP and/or Endocrinologist, and follow prescribed diet per PCP, Endocrinologist and/or dietician. -Toenails 1-5 left, R hallux, R 2nd toe, R 4th toe, and R 5th toe debrided in length and girth without iatrogenic bleeding with sterile nail nipper and dremel.  -Offending nail border debrided and curretaged R hallux utilizing sterile nail nipper and currette. Border(s) cleansed with alcohol and triple antibiotic ointment applied. Dispensed written instructions for once daily epsom salt soaks for 1-3 days. -Patient/POA to call should there be question/concern in the interim.  Return in about 3 months (around 11/24/2021).  Marzetta Board, DPM

## 2021-09-09 ENCOUNTER — Ambulatory Visit: Payer: Medicare Other | Admitting: Dermatology

## 2021-09-14 ENCOUNTER — Encounter: Payer: Self-pay | Admitting: Emergency Medicine

## 2021-09-14 ENCOUNTER — Other Ambulatory Visit: Payer: Self-pay

## 2021-09-14 ENCOUNTER — Ambulatory Visit (INDEPENDENT_AMBULATORY_CARE_PROVIDER_SITE_OTHER): Payer: Medicare Other | Admitting: Emergency Medicine

## 2021-09-14 VITALS — BP 136/68 | HR 86 | Ht 65.0 in | Wt 153.0 lb

## 2021-09-14 DIAGNOSIS — I152 Hypertension secondary to endocrine disorders: Secondary | ICD-10-CM | POA: Diagnosis not present

## 2021-09-14 DIAGNOSIS — E785 Hyperlipidemia, unspecified: Secondary | ICD-10-CM

## 2021-09-14 DIAGNOSIS — N400 Enlarged prostate without lower urinary tract symptoms: Secondary | ICD-10-CM | POA: Diagnosis not present

## 2021-09-14 DIAGNOSIS — E1159 Type 2 diabetes mellitus with other circulatory complications: Secondary | ICD-10-CM | POA: Diagnosis not present

## 2021-09-14 DIAGNOSIS — N5201 Erectile dysfunction due to arterial insufficiency: Secondary | ICD-10-CM

## 2021-09-14 DIAGNOSIS — E1169 Type 2 diabetes mellitus with other specified complication: Secondary | ICD-10-CM | POA: Diagnosis not present

## 2021-09-14 HISTORY — DX: Erectile dysfunction due to arterial insufficiency: N52.01

## 2021-09-14 LAB — COMPREHENSIVE METABOLIC PANEL
ALT: 18 U/L (ref 0–53)
AST: 21 U/L (ref 0–37)
Albumin: 3.9 g/dL (ref 3.5–5.2)
Alkaline Phosphatase: 70 U/L (ref 39–117)
BUN: 20 mg/dL (ref 6–23)
CO2: 27 mEq/L (ref 19–32)
Calcium: 9.3 mg/dL (ref 8.4–10.5)
Chloride: 104 mEq/L (ref 96–112)
Creatinine, Ser: 1.28 mg/dL (ref 0.40–1.50)
GFR: 53.07 mL/min — ABNORMAL LOW (ref >60.00)
Glucose, Bld: 134 mg/dL — ABNORMAL HIGH (ref 70–99)
Potassium: 3.9 mEq/L (ref 3.5–5.1)
Sodium: 142 mEq/L (ref 135–145)
Total Bilirubin: 0.4 mg/dL (ref 0.2–1.2)
Total Protein: 7.4 g/dL (ref 6.0–8.3)

## 2021-09-14 LAB — LIPID PANEL
Cholesterol: 154 mg/dL (ref 0–200)
HDL: 45.2 mg/dL (ref >39.00)
LDL Cholesterol: 90 mg/dL (ref 0–99)
NonHDL: 108.92
Total CHOL/HDL Ratio: 3
Triglycerides: 93 mg/dL (ref 0.0–149.0)
VLDL: 18.6 mg/dL (ref 0.0–40.0)

## 2021-09-14 LAB — POCT GLYCOSYLATED HEMOGLOBIN (HGB A1C): Hemoglobin A1C: 6.4 % — AB (ref 4.0–5.6)

## 2021-09-14 NOTE — Assessment & Plan Note (Signed)
Stable.  Diet and nutrition discussed. Continue atorvastatin 20 mg daily. The 10-year ASCVD risk score (Arnett DK, et al., 2019) is: 41.9%   Values used to calculate the score:     Age: 79 years     Sex: Male     Is Non-Hispanic African American: Yes     Diabetic: Yes     Tobacco smoker: No     Systolic Blood Pressure: 449 mmHg     Is BP treated: Yes     HDL Cholesterol: 59.6 mg/dL     Total Cholesterol: 165 mg/dL

## 2021-09-14 NOTE — Assessment & Plan Note (Signed)
Stable and responding to medication.

## 2021-09-14 NOTE — Progress Notes (Signed)
Case Tony Price 80 y.o.   Chief Complaint  Patient presents with   Rash    Pt states rash is better   Diabetes    HISTORY OF PRESENT ILLNESS: This is a 80 y.o. male with history of hypertension and diabetes here for follow-up. Had rash a couple of months ago which is 100% better.  No other concerns today. More concerned about erectile dysfunction but states his urologist prescribed medication that helps. 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  Rash Pertinent negatives include no congestion, cough, diarrhea, fever, shortness of breath, sore throat or vomiting.  Diabetes Pertinent negatives for hypoglycemia include no dizziness or headaches. Pertinent negatives for diabetes include no chest pain.    Prior to Admission medications   Medication Sig Start Date End Date Taking? Authorizing Provider  amLODipine (NORVASC) 10 MG tablet TAKE 1 TABLET BY MOUTH EVERY DAY 03/26/21  Yes , Ines Bloomer, MD  Ascorbic Acid (VITAMIN C) 500 MG CAPS See admin instructions.   Yes [provider]  aspirin 81 MG EC tablet 1 tablet   Yes [provider]  atorvastatin (LIPITOR) 20 MG tablet Take 1 tablet (20 mg total) by mouth daily. 08/14/21  Yes , Ines Bloomer, MD  augmented betamethasone dipropionate (DIPROLENE-AF) 0.05 % ointment Apply topically 2 (two) times daily. 08/07/21  Yes [provider]  blood glucose meter kit and supplies KIT 1 touch ultra meter.   Check blood sugar once daily E11.9 07/14/17  Yes [name], MD  Blood Glucose Monitoring Suppl (ONE TOUCH ULTRA SYSTEM KIT) w/Device KIT Test blood sugar once daily. Dx: E11.9 07/01/16  Yes [name], MD  carboxymethylcellulose (REFRESH PLUS) 0.5 %  SOLN 1 drop 2 (two) times daily as needed. Into both eyes   Yes [provider]  glimepiride (AMARYL) 2 MG tablet TAKE 1 TABLET BY MOUTH 2 TIMES DAILY. 03/26/21  Yes , Ines Bloomer, MD  glucose blood test strip Test blood sugar once daily. Dx: E11.9 07/04/17  Yes [name], MD  ipratropium (ATROVENT) 0.06 % nasal spray Place 1-2 sprays into both nostrils 3 (three) times daily. As needed for nasal congestion 09/23/20  Yes [name], MD  Lancets [facility] ULTRASOFT) lancets Test blood sugar once daily. Dx: E11.9 07/04/17  Yes [name], MD  latanoprost (XALATAN) 0.005 % ophthalmic solution 1 drop at bedtime. 08/31/19  Yes [provider]  neomycin-polymyxin b-dexamethasone (MAXITROL) 3.5-10000-0.1 OINT SMARTSIG:Topical 06/26/20  Yes [provider]  quinapril (ACCUPRIL) 20 MG tablet Take 1 tablet (20 mg total) by mouth daily. 08/14/21  Yes [place], Ines Bloomer, MD  triamcinolone cream (KENALOG) 0.1 % APPLY TO AFFECTED AREA TWICE A DAY 07/20/21  Yes Horald Pollen, MD    No Known Allergies  Patient Active Problem List   Diagnosis Date Noted   Benign prostatic hyperplasia without lower urinary tract symptoms 07/27/2021   Personal history of colonic polyps 11/04/2020   Diabetic retinopathy ([place]) 03/31/2016   Dyslipidemia associated with type 2 diabetes mellitus ([place]) 11/17/2014   Hypertension associated with diabetes ([place]) 11/17/2014    Past Medical History:  Diagnosis Date   Chest pain    Diabetes mellitus    Hypercholesterolemia    Hypertension    Kidney stones  Past Surgical History:  Procedure Laterality Date   CATARACT EXTRACTION     EYE SURGERY     PROSTATE BIOPSY     ROTATOR CUFF REPAIR     Right    Social History   Socioeconomic History   Marital status: Divorced    Spouse name: Not on file   Number of children: 3   Years of education: Not on file   Highest education level: Not on file  Occupational  History   Not on file  Tobacco Use   Smoking status: Former    Years: 2.00    Types: Cigarettes   Smokeless tobacco: Never  Substance and Sexual Activity   Alcohol use: No    Alcohol/week: 0.0 standard drinks   Drug use: No   Sexual activity: Not on file  Other Topics Concern   Not on file  Social History Narrative   Not on file   Social Determinants of Health   Financial Resource Strain: Low Risk    Difficulty of Paying Living Expenses: Not hard at all  Food Insecurity: No Food Insecurity   Worried About Charity fundraiser in the Last Year: Never true   [place] in the Last Year: Never true  Transportation Needs: No Transportation Needs   Lack of Transportation (Medical): No   Lack of Transportation (Non-Medical): No  Physical Activity: Sufficiently Active   Days of Exercise per Week: 7 days   Minutes of Exercise per Session: 60 min  Stress: No Stress Concern Present   Feeling of Stress : Not at all  Social Connections: Moderately Isolated   Frequency of Communication with Friends and Family: More than three times a week   Frequency of Social Gatherings with Friends and Family: Twice a week   Attends Religious Services: More than 4 times per year   Active Member of [employer] or Organizations: No   Attends Archivist Meetings: Never   Marital Status: Widowed  Human resources officer Violence: Not At Risk   Fear of Current or Ex-Partner: No   Emotionally Abused: No   Physically Abused: No   Sexually Abused: No    Family History  Family history unknown: Yes     Review of Systems  Constitutional: Negative.  Negative for chills and fever.  HENT:  Negative for congestion and sore throat.   Respiratory: Negative.  Negative for cough and shortness of breath.   Cardiovascular: Negative.  Negative for chest pain and palpitations.  Gastrointestinal: Negative.  Negative for abdominal pain, diarrhea, nausea and vomiting.  Genitourinary: Negative.  Negative for  dysuria and hematuria.  Skin:  Positive for rash.  Neurological: Negative.  Negative for dizziness and headaches.  All other systems reviewed and are negative. Today's Vitals   09/14/21 1058  BP: 136/68  Pulse: 86  SpO2: 97%  Weight: 153 lb (69.4 kg)  Height: 5' 5" (1.651 m)   Body mass index is 25.46 kg/m. Wt Readings from Last 3 Encounters:  09/14/21 153 lb (69.4 kg)  07/27/21 158 lb (71.7 kg)  07/06/21 159 lb (72.1 kg)     Physical Exam Vitals reviewed.  Constitutional:      Appearance: Normal appearance.  HENT:     Head: Normocephalic.  Eyes:     Extraocular Movements: Extraocular movements intact.     Pupils: Pupils are equal, round, and reactive to light.  Cardiovascular:     Rate and Rhythm: Normal rate and regular rhythm.     Pulses:  Normal pulses.     Heart sounds: Normal heart sounds.  Pulmonary:     Effort: Pulmonary effort is normal.     Breath sounds: Normal breath sounds.  Abdominal:     General: Bowel sounds are normal. There is no distension.     Palpations: Abdomen is soft.     Tenderness: There is no abdominal tenderness.  Musculoskeletal:        General: Normal range of motion.     Cervical back: No tenderness.  Lymphadenopathy:     Cervical: No cervical adenopathy.  Skin:    General: Skin is warm and dry.     Capillary Refill: Capillary refill takes less than 2 seconds.  Neurological:     General: No focal deficit present.     Mental Status: He is alert and oriented to person, place, and time.  Psychiatric:        Mood and Affect: Mood normal.        Behavior: Behavior normal.     ASSESSMENT & PLAN: Problem List Items Addressed This Visit       Cardiovascular and Mediastinum   Hypertension associated with diabetes ([place]) - Primary    Well-controlled hypertension. BP Readings from Last 3 Encounters:  09/14/21 136/68  07/27/21 128/60  07/15/21 (!) 141/77  Continue Accupril 20 mg and amlodipine 10 mg daily. Well-controlled diabetes  with hemoglobin A1c of 6.4  Lab Results  Component Value Date   HGBA1C 6.4 (A) 09/14/2021  Continue glimepiride 2 mg daily. Diet and nutrition discussed. Follow-up in 6 months.        Relevant Orders   POCT glycosylated hemoglobin (Hb A1C) (Completed)   Comprehensive metabolic panel   Lipid panel   Erectile dysfunction due to arterial insufficiency    Stable and responding to medication.        Endocrine   Dyslipidemia associated with type 2 diabetes mellitus ([place])    Stable.  Diet and nutrition discussed. Continue atorvastatin 20 mg daily. The 10-year ASCVD risk score (Arnett DK, et al., 2019) is: 41.9%   Values used to calculate the score:     Age: 31 years     Sex: Male     Is Non-Hispanic African American: Yes     Diabetic: Yes     Tobacco smoker: No     Systolic Blood Pressure: 604 mmHg     Is BP treated: Yes     HDL Cholesterol: 59.6 mg/dL     Total Cholesterol: 165 mg/dL         Genitourinary   Benign prostatic hyperplasia without lower urinary tract symptoms    Stable with occasional dribbling.  Sees urologist on a regular basis. Has history of chronically elevated PSA.      Patient Instructions  Health Maintenance After Age 25 After age 61, you are at a higher risk for certain long-term diseases and infections as well as injuries from falls. Falls are a major cause of broken bones and head injuries in people who are older than age 74. Getting regular preventive care can help to keep you healthy and well. Preventive care includes getting regular testing and making lifestyle changes as recommended by your health care provider. Talk with your health care provider about: Which screenings and tests you should have. A screening is a test that checks for a disease when you have no symptoms. A diet and exercise plan that is right for you. What should I know about screenings and tests to prevent  falls? Screening and testing are the best ways to find a health  problem early. Early diagnosis and treatment give you the best chance of managing medical conditions that are common after age 44. Certain conditions and lifestyle choices may make you more likely to have a fall. Your health care provider may recommend: Regular vision checks. Poor vision and conditions such as cataracts can make you more likely to have a fall. If you wear glasses, make sure to get your prescription updated if your vision changes. Medicine review. Work with your health care provider to regularly review all of the medicines you are taking, including over-the-counter medicines. Ask your health care provider about any side effects that may make you more likely to have a fall. Tell your health care provider if any medicines that you take make you feel dizzy or sleepy. Strength and balance checks. Your health care provider may recommend certain tests to check your strength and balance while standing, walking, or changing positions. Foot health exam. Foot pain and numbness, as well as not wearing proper footwear, can make you more likely to have a fall. Screenings, including: Osteoporosis screening. Osteoporosis is a condition that causes the bones to get weaker and break more easily. Blood pressure screening. Blood pressure changes and medicines to control blood pressure can make you feel dizzy. Depression screening. You may be more likely to have a fall if you have a fear of falling, feel depressed, or feel unable to do activities that you used to do. Alcohol use screening. Using too much alcohol can affect your balance and may make you more likely to have a fall. Follow these instructions at home: Lifestyle Do not drink alcohol if: Your health care provider tells you not to drink. If you drink alcohol: Limit how much you have to: 0-1 drink a day for women. 0-2 drinks a day for men. Know how much alcohol is in your drink. In the [place] one drink equals one 12 oz bottle of beer (355 mL),  one 5 oz glass of wine (148 mL), or one 1 oz glass of hard liquor (44 mL). Do not use any products that contain nicotine or tobacco. These products include cigarettes, chewing tobacco, and vaping devices, such as e-cigarettes. If you need help quitting, ask your health care provider. Activity  Follow a regular exercise program to stay fit. This will help you maintain your balance. Ask your health care provider what types of exercise are appropriate for you. If you need a cane or walker, use it as recommended by your health care provider. Wear supportive shoes that have nonskid soles. Safety  Remove any tripping hazards, such as rugs, cords, and clutter. Install safety equipment such as grab bars in bathrooms and safety rails on stairs. Keep rooms and walkways well-lit. General instructions Talk with your health care provider about your risks for falling. Tell your health care provider if: You fall. Be sure to tell your health care provider about all falls, even ones that seem minor. You feel dizzy, tiredness (fatigue), or off-balance. Take over-the-counter and prescription medicines only as told by your health care provider. These include supplements. Eat a healthy diet and maintain a healthy weight. A healthy diet includes low-fat dairy products, low-fat (lean) meats, and fiber from whole grains, beans, and lots of fruits and vegetables. Stay current with your vaccines. Schedule regular health, dental, and eye exams. Summary Having a healthy lifestyle and getting preventive care can help to protect your health and wellness after  age 83. Screening and testing are the best way to find a health problem early and help you avoid having a fall. Early diagnosis and treatment give you the best chance for managing medical conditions that are more common for people who are older than age 12. Falls are a major cause of broken bones and head injuries in people who are older than age 30. Take precautions  to prevent a fall at home. Work with your health care provider to learn what changes you can make to improve your health and wellness and to prevent falls. This information is not intended to replace advice given to you by your health care provider. Make sure you discuss any questions you have with your health care provider. Document Revised: 02/23/2021 Document Reviewed: 02/23/2021 Elsevier Patient Education  2022 [place], [place] [facility]

## 2021-09-14 NOTE — Assessment & Plan Note (Signed)
Stable with occasional dribbling.  Sees urologist on a regular basis. Has history of chronically elevated PSA.

## 2021-09-14 NOTE — Assessment & Plan Note (Signed)
Well-controlled hypertension. BP Readings from Last 3 Encounters:  09/14/21 136/68  07/27/21 128/60  07/15/21 (!) 141/77  Continue Accupril 20 mg and amlodipine 10 mg daily. Well-controlled diabetes with hemoglobin A1c of 6.4  Lab Results  Component Value Date   HGBA1C 6.4 (A) 09/14/2021  Continue glimepiride 2 mg daily. Diet and nutrition discussed. Follow-up in 6 months.

## 2021-09-14 NOTE — Patient Instructions (Signed)
Health Maintenance After Age 80 After age 80, you are at a higher risk for certain long-term diseases and infections as well as injuries from falls. Falls are a major cause of broken bones and head injuries in people who are older than age 80. Getting regular preventive care can help to keep you healthy and well. Preventive care includes getting regular testing and making lifestyle changes as recommended by your health care provider. Talk with your health care provider about: Which screenings and tests you should have. A screening is a test that checks for a disease when you have no symptoms. A diet and exercise plan that is right for you. What should I know about screenings and tests to prevent falls? Screening and testing are the best ways to find a health problem early. Early diagnosis and treatment give you the best chance of managing medical conditions that are common after age 80. Certain conditions and lifestyle choices may make you more likely to have a fall. Your health care provider may recommend: Regular vision checks. Poor vision and conditions such as cataracts can make you more likely to have a fall. If you wear glasses, make sure to get your prescription updated if your vision changes. Medicine review. Work with your health care provider to regularly review all of the medicines you are taking, including over-the-counter medicines. Ask your health care provider about any side effects that may make you more likely to have a fall. Tell your health care provider if any medicines that you take make you feel dizzy or sleepy. Strength and balance checks. Your health care provider may recommend certain tests to check your strength and balance while standing, walking, or changing positions. Foot health exam. Foot pain and numbness, as well as not wearing proper footwear, can make you more likely to have a fall. Screenings, including: Osteoporosis screening. Osteoporosis is a condition that causes  the bones to get weaker and break more easily. Blood pressure screening. Blood pressure changes and medicines to control blood pressure can make you feel dizzy. Depression screening. You may be more likely to have a fall if you have a fear of falling, feel depressed, or feel unable to do activities that you used to do. Alcohol use screening. Using too much alcohol can affect your balance and may make you more likely to have a fall. Follow these instructions at home: Lifestyle Do not drink alcohol if: Your health care provider tells you not to drink. If you drink alcohol: Limit how much you have to: 0-1 drink a day for women. 0-2 drinks a day for men. Know how much alcohol is in your drink. In the [place] one drink equals one 12 oz bottle of beer (355 mL), one 5 oz glass of wine (148 mL), or one 1 oz glass of hard liquor (44 mL). Do not use any products that contain nicotine or tobacco. These products include cigarettes, chewing tobacco, and vaping devices, such as e-cigarettes. If you need help quitting, ask your health care provider. Activity  Follow a regular exercise program to stay fit. This will help you maintain your balance. Ask your health care provider what types of exercise are appropriate for you. If you need a cane or walker, use it as recommended by your health care provider. Wear supportive shoes that have nonskid soles. Safety  Remove any tripping hazards, such as rugs, cords, and clutter. Install safety equipment such as grab bars in bathrooms and safety rails on stairs. Keep rooms and walkways   well-lit. General instructions Talk with your health care provider about your risks for falling. Tell your health care provider if: You fall. Be sure to tell your health care provider about all falls, even ones that seem minor. You feel dizzy, tiredness (fatigue), or off-balance. Take over-the-counter and prescription medicines only as told by your health care provider. These include  supplements. Eat a healthy diet and maintain a healthy weight. A healthy diet includes low-fat dairy products, low-fat (lean) meats, and fiber from whole grains, beans, and lots of fruits and vegetables. Stay current with your vaccines. Schedule regular health, dental, and eye exams. Summary Having a healthy lifestyle and getting preventive care can help to protect your health and wellness after age 80. Screening and testing are the best way to find a health problem early and help you avoid having a fall. Early diagnosis and treatment give you the best chance for managing medical conditions that are more common for people who are older than age 80. Falls are a major cause of broken bones and head injuries in people who are older than age 80. Take precautions to prevent a fall at home. Work with your health care provider to learn what changes you can make to improve your health and wellness and to prevent falls. This information is not intended to replace advice given to you by your health care provider. Make sure you discuss any questions you have with your health care provider. Document Revised: 02/23/2021 Document Reviewed: 02/23/2021 Elsevier Patient Education  2022 Elsevier Inc[employer]  

## 2021-09-19 ENCOUNTER — Other Ambulatory Visit: Payer: Self-pay | Admitting: Emergency Medicine

## 2021-09-19 DIAGNOSIS — I10 Essential (primary) hypertension: Secondary | ICD-10-CM

## 2021-09-19 DIAGNOSIS — E11319 Type 2 diabetes mellitus with unspecified diabetic retinopathy without macular edema: Secondary | ICD-10-CM

## 2021-09-24 ENCOUNTER — Encounter (HOSPITAL_COMMUNITY): Payer: Self-pay | Admitting: Emergency Medicine

## 2021-09-24 ENCOUNTER — Emergency Department (HOSPITAL_COMMUNITY)
Admission: EM | Admit: 2021-09-24 | Discharge: 2021-09-24 | Disposition: A | Discharge status: Home / Self Care | Payer: Medicare Other | Attending: Emergency Medicine | Admitting: Emergency Medicine

## 2021-09-24 ENCOUNTER — Other Ambulatory Visit: Payer: Self-pay

## 2021-09-24 DIAGNOSIS — Z79899 Other long term (current) drug therapy: Secondary | ICD-10-CM | POA: Diagnosis not present

## 2021-09-24 DIAGNOSIS — I10 Essential (primary) hypertension: Secondary | ICD-10-CM | POA: Insufficient documentation

## 2021-09-24 DIAGNOSIS — N3000 Acute cystitis without hematuria: Secondary | ICD-10-CM | POA: Insufficient documentation

## 2021-09-24 DIAGNOSIS — E119 Type 2 diabetes mellitus without complications: Secondary | ICD-10-CM | POA: Diagnosis not present

## 2021-09-24 DIAGNOSIS — Z7982 Long term (current) use of aspirin: Secondary | ICD-10-CM | POA: Insufficient documentation

## 2021-09-24 DIAGNOSIS — Z87891 Personal history of nicotine dependence: Secondary | ICD-10-CM | POA: Diagnosis not present

## 2021-09-24 DIAGNOSIS — R32 Unspecified urinary incontinence: Secondary | ICD-10-CM | POA: Diagnosis present

## 2021-09-24 LAB — URINALYSIS, ROUTINE W REFLEX MICROSCOPIC
Bilirubin Urine: NEGATIVE
Glucose, UA: NEGATIVE mg/dL
Hgb urine dipstick: NEGATIVE
Ketones, ur: NEGATIVE mg/dL
Nitrite: NEGATIVE
Protein, ur: NEGATIVE mg/dL
Specific Gravity, Urine: 1.019 (ref 1.005–1.030)
WBC, UA: >50 WBC/hpf — ABNORMAL HIGH (ref 0–5)
pH: 5 (ref 5.0–8.0)

## 2021-09-24 LAB — CBC WITH DIFFERENTIAL/PLATELET
Abs Immature Granulocytes: 0.02 10*3/uL (ref 0.00–0.07)
Basophils Absolute: 0 10*3/uL (ref 0.0–0.1)
Basophils Relative: 1 %
Eosinophils Absolute: 0 10*3/uL (ref 0.0–0.5)
Eosinophils Relative: 0 %
HCT: 38.5 % — ABNORMAL LOW (ref 39.0–52.0)
Hemoglobin: 11.8 g/dL — ABNORMAL LOW (ref 13.0–17.0)
Immature Granulocytes: 0 %
Lymphocytes Relative: 21 %
Lymphs Abs: 1.4 10*3/uL (ref 0.7–4.0)
MCH: 25.9 pg — ABNORMAL LOW (ref 26.0–34.0)
MCHC: 30.6 g/dL (ref 30.0–36.0)
MCV: 84.6 fL (ref 80.0–100.0)
Monocytes Absolute: 0.3 10*3/uL (ref 0.1–1.0)
Monocytes Relative: 4 %
Neutro Abs: 5 10*3/uL (ref 1.7–7.7)
Neutrophils Relative %: 74 %
Platelets: 356 10*3/uL (ref 150–400)
RBC: 4.55 MIL/uL (ref 4.22–5.81)
RDW: 14.5 % (ref 11.5–15.5)
WBC: 6.8 10*3/uL (ref 4.0–10.5)
nRBC: 0 % (ref 0.0–0.2)

## 2021-09-24 LAB — BASIC METABOLIC PANEL
Anion gap: 5 (ref 5–15)
BUN: 19 mg/dL (ref 8–23)
CO2: 28 mmol/L (ref 22–32)
Calcium: 9.2 mg/dL (ref 8.9–10.3)
Chloride: 108 mmol/L (ref 98–111)
Creatinine, Ser: 1.08 mg/dL (ref 0.61–1.24)
GFR, Estimated: >60 mL/min (ref >60)
Glucose, Bld: 136 mg/dL — ABNORMAL HIGH (ref 70–99)
Potassium: 4.3 mmol/L (ref 3.5–5.1)
Sodium: 141 mmol/L (ref 135–145)

## 2021-09-24 LAB — LIPASE, BLOOD: Lipase: 26 U/L (ref 11–51)

## 2021-09-24 MED ORDER — CEPHALEXIN 500 MG PO CAPS: 500.0000 mg | ORAL_CAPSULE | Freq: Three times a day (TID) | ORAL | 0 refills | Status: DC

## 2021-09-24 MED ORDER — CEPHALEXIN 500 MG PO CAPS
500.0000 mg | ORAL_CAPSULE | Freq: Once | ORAL | Status: AC
  Administered 2021-09-24: 500 mg via ORAL
  Filled 2021-09-24: qty 1

## 2021-09-24 NOTE — Discharge Instructions (Addendum)
You have symptoms likely due to urinary tract infection.  Please take antibiotic as prescribed for the full duration.  Call and follow-up with your primary care doctor as needed.  Return if you have any concern.

## 2021-09-24 NOTE — ED Provider Notes (Signed)
[place] DEPT Provider Note   CSN: [id number] Arrival date & time: 09/24/21  4196     History No chief complaint on file.   Tony Price is a 80 y.o. male.  The history is provided by the patient and medical records. No language interpreter was used.   80 year old male significant history of diabetes, hypercholesterolemia, hypertension, who presents complaining of urinary symptoms.  Patient report for the past 2 to 3 days he noticed that after he urinates he would have some transient urinary incontinence which is new.  He denies any associated fever or chills no abdominal pain or back pain no constipation no burning on urination urinary urgency or frequency.  When asked if he has any history of BPH patient denies however he does have history of BPH.  However patient denies trouble initiating or maintaining his urinary streams.  No other complaint.  Pt try to have an appointment with his PCP but the next appointment is not until 2 months later.  Past Medical History:  Diagnosis Date   Chest pain    Diabetes mellitus    Hypercholesterolemia    Hypertension    Kidney stones     Patient Active Problem List   Diagnosis Date Noted   Erectile dysfunction due to arterial insufficiency 09/14/2021   Benign prostatic hyperplasia without lower urinary tract symptoms 07/27/2021   Personal history of colonic polyps 11/04/2020   Diabetic retinopathy ([place]) 03/31/2016   Dyslipidemia associated with type 2 diabetes mellitus ([place]) 11/17/2014   Hypertension associated with diabetes ([place]) 11/17/2014    Past Surgical History:  Procedure Laterality Date   CATARACT EXTRACTION     EYE SURGERY     PROSTATE BIOPSY     ROTATOR CUFF REPAIR     Right       Family History  Family history unknown: Yes    Social History   Tobacco Use   Smoking status: Former    Years: 2.00    Types: Cigarettes   Smokeless tobacco: Never  Substance Use Topics   Alcohol  use: No    Alcohol/week: 0.0 standard drinks   Drug use: No    Home Medications Prior to Admission medications   Medication Sig Start Date End Date Taking? Authorizing Provider  amLODipine (NORVASC) 10 MG tablet TAKE 1 TABLET BY MOUTH EVERY DAY 09/20/21   Horald Pollen, MD  Ascorbic Acid (VITAMIN C) 500 MG CAPS See admin instructions.    [provider]  aspirin 81 MG EC tablet 1 tablet    [provider]  atorvastatin (LIPITOR) 20 MG tablet Take 1 tablet (20 mg total) by mouth daily. 08/14/21   Horald Pollen, MD  augmented betamethasone dipropionate (DIPROLENE-AF) 0.05 % ointment Apply topically 2 (two) times daily. 08/07/21   [provider]  blood glucose meter kit and supplies KIT 1 touch ultra meter.   Check blood sugar once daily E11.9 07/14/17   [name], MD  Blood Glucose Monitoring Suppl (ONE TOUCH ULTRA SYSTEM KIT) w/Device KIT Test blood sugar once daily. Dx: E11.9 07/01/16   [name], MD  carboxymethylcellulose (REFRESH PLUS) 0.5 % SOLN 1 drop 2 (two) times daily as needed. Into both eyes    [provider]  glimepiride (AMARYL) 2 MG tablet TAKE 1 TABLET BY MOUTH TWICE A DAY 09/20/21   Horald Pollen, MD  glucose blood test strip Test blood sugar once daily. Dx: E11.9 07/04/17   [name],  MD  ipratropium (ATROVENT) 0.06 % nasal spray Place 1-2 sprays into both nostrils 3 (three) times daily. As needed for nasal congestion 09/23/20   [name], MD  Lancets [facility] ULTRASOFT) lancets Test blood sugar once daily. Dx: E11.9 07/04/17   [name], MD  latanoprost (XALATAN) 0.005 % ophthalmic solution 1 drop at bedtime. 08/31/19   [provider]  neomycin-polymyxin b-dexamethasone (MAXITROL) 3.5-10000-0.1 OINT SMARTSIG:Topical 06/26/20   [provider]  quinapril (ACCUPRIL) 20 MG tablet Take 1 tablet (20 mg total) by mouth daily. 08/14/21   Horald Pollen, MD   triamcinolone cream (KENALOG) 0.1 % APPLY TO AFFECTED AREA TWICE A DAY 07/20/21   Horald Pollen, MD    Allergies    Patient has no known allergies.  Review of Systems   Review of Systems  All other systems reviewed and are negative.  Physical Exam Updated Vital Signs BP 132/74   Pulse 89   Temp 98.5 F (36.9 C) (Oral)   Resp 18   Ht _0  (1.702 m)   Wt 70.3 kg   SpO2 100%   BMI 24.28 kg/m   Physical Exam Vitals and nursing note reviewed.  Constitutional:      General: He is not in acute distress.    Appearance: He is well-developed.  HENT:     Head: Atraumatic.  Eyes:     Conjunctiva/sclera: Conjunctivae normal.  Cardiovascular:     Rate and Rhythm: Normal rate and regular rhythm.     Pulses: Normal pulses.     Heart sounds: Normal heart sounds.  Pulmonary:     Effort: Pulmonary effort is normal.     Breath sounds: Normal breath sounds.  Abdominal:     Palpations: Abdomen is soft.     Tenderness: There is no abdominal tenderness.  Musculoskeletal:     Cervical back: Neck supple.  Skin:    Findings: No rash.  Neurological:     Mental Status: He is alert.  Psychiatric:        Mood and Affect: Mood normal.    ED Results / Procedures / Treatments   Labs (all labs ordered are listed, but only abnormal results are displayed) Labs Reviewed  CBC WITH DIFFERENTIAL/PLATELET - Abnormal; Notable for the following components:      Result Value   Hemoglobin 11.8 (*)    HCT 38.5 (*)    MCH 25.9 (*)    All other components within normal limits  URINALYSIS, ROUTINE W REFLEX MICROSCOPIC - Abnormal; Notable for the following components:   APPearance HAZY (*)    Leukocytes,Ua LARGE (*)    WBC, UA >50 (*)    Bacteria, UA MANY (*)    All other components within normal limits  BASIC METABOLIC PANEL - Abnormal; Notable for the following components:   Glucose, Bld 136 (*)    All other components within normal limits  LIPASE, BLOOD     EKG None  Radiology No results found.  Procedures Procedures   Medications Ordered in ED Medications  cephALEXin (KEFLEX) capsule 500 mg (500 mg Oral Given 09/24/21 1214)    ED Course  I have reviewed the triage vital signs and the nursing notes.  Pertinent labs & imaging results that were available during my care of the patient were reviewed by me and considered in my medical decision making (see chart for details).    MDM Rules/Calculators/A&P  BP 132/74   Pulse 89   Temp 98.5 F (36.9 C) (Oral)   Resp 18   Ht _0  (1.702 m)   Wt 70.3 kg   SpO2 100%   BMI 24.28 kg/m   Final Clinical Impression(s) / ED Diagnoses Final diagnoses:  Acute cystitis without hematuria    Rx / DC Orders ED Discharge Orders          Ordered    cephALEXin (KEFLEX) 500 MG capsule  3 times daily        09/24/21 1247           11:18 AM Patient endorsed urinary incontinence for the past few days.  No complaint of low back pain focal numbness or focal weakness constipation.  Does have history of BPH.  He has a benign abdominal exam.  Work-up initiated.  12:49 PM Labs are reassuring, urinalysis shows finding consistent with a urinary tract infection.  Will treat with Keflex.  Outpatient follow-up recommended.  Return precaution given.   Domenic Moras, PA-C 09/24/21 1249    Gareth Morgan, MD 09/24/21 2237

## 2021-09-24 NOTE — ED Triage Notes (Signed)
Patient reports dribbling after urinating x1 week and he wants to get checked. Had tried to make an apt w/ his PCP and they quoted him 2 months. No hx of BPH per pt. Denies dysuria, no odor.

## 2021-10-01 ENCOUNTER — Other Ambulatory Visit: Payer: Self-pay

## 2021-10-01 ENCOUNTER — Encounter: Payer: Self-pay | Admitting: Emergency Medicine

## 2021-10-01 ENCOUNTER — Ambulatory Visit (INDEPENDENT_AMBULATORY_CARE_PROVIDER_SITE_OTHER): Payer: Medicare Other | Admitting: Emergency Medicine

## 2021-10-01 VITALS — BP 130/70 | HR 65 | Temp 98.4°F | Ht 67.0 in | Wt 156.0 lb

## 2021-10-01 DIAGNOSIS — Z87440 Personal history of urinary (tract) infections: Secondary | ICD-10-CM | POA: Diagnosis not present

## 2021-10-01 DIAGNOSIS — N3000 Acute cystitis without hematuria: Secondary | ICD-10-CM

## 2021-10-01 DIAGNOSIS — N400 Enlarged prostate without lower urinary tract symptoms: Secondary | ICD-10-CM | POA: Diagnosis not present

## 2021-10-01 LAB — POCT URINALYSIS DIP (MANUAL ENTRY)
Bilirubin, UA: NEGATIVE
Blood, UA: NEGATIVE
Glucose, UA: NEGATIVE mg/dL
Ketones, POC UA: NEGATIVE mg/dL
Nitrite, UA: NEGATIVE
Spec Grav, UA: >=1.03 — AB (ref 1.010–1.025)
Urobilinogen, UA: 0.2 E.U./dL
pH, UA: 6 (ref 5.0–8.0)

## 2021-10-01 MED ORDER — CIPROFLOXACIN HCL 500 MG PO TABS: 500.0000 mg | ORAL_TABLET | Freq: Two times a day (BID) | ORAL | 0 refills | Status: AC

## 2021-10-01 NOTE — Assessment & Plan Note (Signed)
Asymptomatic.  Feeling better.  However urinalysis still showing significant amount of leukocytes.  Partially treated infection.  Will start Cipro 500 mg twice a day for 7 days.  Urine sent for culture today.

## 2021-10-01 NOTE — Progress Notes (Signed)
Tony Price 80 y.o.   Chief Complaint  Patient presents with   Hospitalization Follow-up    Acute cystitis    HISTORY OF PRESENT ILLNESS: This is a 80 y.o. male here for follow-up of emergency department visit when he presented with UTI symptoms on 09/24/2021. Urinalysis showed signs of infection.  No urine culture done.  Normal CBC and BMP.  We will started on Keflex 500 mg 3 times a day which he finished yesterday.  Asymptomatic. No other complaints or medical concerns today.  HPI   Prior to Admission medications   Medication Sig Start Date End Date Taking? Authorizing Provider  amLODipine (NORVASC) 10 MG tablet TAKE 1 TABLET BY MOUTH EVERY DAY 09/20/21  Yes Karleigh Bunte, Ines Bloomer, MD  Ascorbic Acid (VITAMIN C) 500 MG CAPS See admin instructions.   Yes [provider]  aspirin 81 MG EC tablet 1 tablet   Yes [provider]  atorvastatin (LIPITOR) 20 MG tablet Take 1 tablet (20 mg total) by mouth daily. 08/14/21  Yes Mohamedamin Nifong, Ines Bloomer, MD  augmented betamethasone dipropionate (DIPROLENE-AF) 0.05 % ointment Apply topically 2 (two) times daily. 08/07/21  Yes [provider]  blood glucose meter kit and supplies KIT 1 touch ultra meter.   Check blood sugar once daily E11.9 07/14/17  Yes [name], MD  Blood Glucose Monitoring Suppl (ONE TOUCH ULTRA SYSTEM KIT) w/Device KIT Test blood sugar once daily. Dx: E11.9 07/01/16  Yes [name], MD  carboxymethylcellulose (REFRESH PLUS) 0.5 % SOLN 1 drop 2 (two) times daily as needed. Into both eyes   Yes [provider]  cephALEXin (KEFLEX) 500 MG capsule Take 1 capsule (500 mg total) by mouth 3 (three) times daily. 09/24/21  Yes Domenic Moras, PA-C  glimepiride (AMARYL) 2 MG tablet TAKE 1 TABLET BY MOUTH TWICE A DAY 09/20/21  Yes Jahrell Hamor, Ines Bloomer, MD  glucose blood test strip Test blood sugar once daily. Dx: E11.9 07/04/17  Yes [name], MD  ipratropium (ATROVENT) 0.06 % nasal  spray Place 1-2 sprays into both nostrils 3 (three) times daily. As needed for nasal congestion 09/23/20  Yes [name], MD  Lancets [facility] ULTRASOFT) lancets Test blood sugar once daily. Dx: E11.9 07/04/17  Yes [name], MD  latanoprost (XALATAN) 0.005 % ophthalmic solution 1 drop at bedtime. 08/31/19  Yes [provider]  neomycin-polymyxin b-dexamethasone (MAXITROL) 3.5-10000-0.1 OINT SMARTSIG:Topical 06/26/20  Yes [provider]  quinapril (ACCUPRIL) 20 MG tablet Take 1 tablet (20 mg total) by mouth daily. 08/14/21  Yes [place], Ines Bloomer, MD  triamcinolone cream (KENALOG) 0.1 % APPLY TO AFFECTED AREA TWICE A DAY 07/20/21  Yes Horald Pollen, MD    No Known Allergies  Patient Active Problem List   Diagnosis Date Noted   Erectile dysfunction due to arterial insufficiency 09/14/2021   Benign prostatic hyperplasia without lower urinary tract symptoms 07/27/2021   Personal history of colonic polyps 11/04/2020   Diabetic retinopathy ([place]) 03/31/2016   Dyslipidemia associated with type 2 diabetes mellitus ([place]) 11/17/2014   Hypertension associated with diabetes ([place]) 11/17/2014    Past Medical History:  Diagnosis Date   Chest pain    Diabetes mellitus    Hypercholesterolemia    Hypertension    Kidney stones     Past Surgical History:  Procedure Laterality Date   CATARACT EXTRACTION     EYE SURGERY     PROSTATE BIOPSY     ROTATOR CUFF REPAIR  Right    Social History   Socioeconomic History   Marital status: Divorced    Spouse name: Not on file   Number of children: 3   Years of education: Not on file   Highest education level: Not on file  Occupational History   Not on file  Tobacco Use   Smoking status: Former    Years: 2.00    Types: Cigarettes   Smokeless tobacco: Never  Substance and Sexual Activity   Alcohol use: No    Alcohol/week: 0.0 standard drinks   Drug use: No   Sexual activity: Not on file  Other Topics  Concern   Not on file  Social History Narrative   Not on file   Social Determinants of Health   Financial Resource Strain: Low Risk    Difficulty of Paying Living Expenses: Not hard at all  Food Insecurity: No Food Insecurity   Worried About Charity fundraiser in the Last Year: Never true   [place] in the Last Year: Never true  Transportation Needs: No Transportation Needs   Lack of Transportation (Medical): No   Lack of Transportation (Non-Medical): No  Physical Activity: Sufficiently Active   Days of Exercise per Week: 7 days   Minutes of Exercise per Session: 60 min  Stress: No Stress Concern Present   Feeling of Stress : Not at all  Social Connections: Moderately Isolated   Frequency of Communication with Friends and Family: More than three times a week   Frequency of Social Gatherings with Friends and Family: Twice a week   Attends Religious Services: More than 4 times per year   Active Member of [employer] or Organizations: No   Attends Archivist Meetings: Never   Marital Status: Widowed  Human resources officer Violence: Not At Risk   Fear of Current or Ex-Partner: No   Emotionally Abused: No   Physically Abused: No   Sexually Abused: No    Family History  Family history unknown: Yes     Review of Systems  Constitutional: Negative.  Negative for chills and fever.  HENT: Negative.  Negative for congestion and sore throat.   Respiratory: Negative.  Negative for cough and shortness of breath.   Cardiovascular: Negative.  Negative for chest pain and palpitations.  Gastrointestinal: Negative.  Negative for abdominal pain, diarrhea, nausea and vomiting.  Genitourinary: Negative.  Negative for dysuria and hematuria.  Musculoskeletal: Negative.   Skin: Negative.  Negative for rash.  Neurological: Negative.  Negative for dizziness and headaches.  All other systems reviewed and are negative.  Today's Vitals   10/01/21 0953  BP: (!) 142/68  Pulse: 65  Temp:  98.4 F (36.9 C)  TempSrc: Oral  SpO2: 98%  Weight: 156 lb (70.8 kg)  Height: _0  (1.702 m)   Body mass index is 24.43 kg/m. BP Readings from Last 3 Encounters:  10/01/21 (!) 142/68  09/24/21 137/72  09/14/21 136/68    Physical Exam Vitals reviewed.  Constitutional:      Appearance: Normal appearance.  HENT:     Head: Normocephalic.     Mouth/Throat:     Mouth: Mucous membranes are moist.     Pharynx: Oropharynx is clear.  Eyes:     Extraocular Movements: Extraocular movements intact.     Conjunctiva/sclera: Conjunctivae normal.     Pupils: Pupils are equal, round, and reactive to light.  Cardiovascular:     Rate and Rhythm: Normal rate and regular rhythm.  Pulses: Normal pulses.     Heart sounds: Normal heart sounds.  Pulmonary:     Effort: Pulmonary effort is normal.     Breath sounds: Normal breath sounds.  Abdominal:     General: There is no distension.     Palpations: Abdomen is soft.     Tenderness: There is no abdominal tenderness. There is no right CVA tenderness or left CVA tenderness.  Musculoskeletal:        General: Normal range of motion.     Cervical back: Normal range of motion and neck supple.     Right lower leg: No edema.     Left lower leg: No edema.  Skin:    General: Skin is warm and dry.     Capillary Refill: Capillary refill takes less than 2 seconds.  Neurological:     General: No focal deficit present.     Mental Status: He is alert and oriented to person, place, and time.  Psychiatric:        Mood and Affect: Mood normal.        Behavior: Behavior normal.   Results for orders placed or performed in visit on 10/01/21 (from the past 24 hour(s))  POCT urinalysis dipstick     Status: Abnormal   Collection Time: 10/01/21 10:14 AM  Result Value Ref Range   Color, UA yellow yellow   Clarity, UA clear clear   Glucose, UA negative negative mg/dL   Bilirubin, UA negative negative   Ketones, POC UA negative negative mg/dL   Spec Grav,  UA >=1.030 (A) 1.010 - 1.025   Blood, UA negative negative   pH, UA 6.0 5.0 - 8.0   Protein Ur, POC trace (A) negative mg/dL   Urobilinogen, UA 0.2 0.2 or 1.0 E.U./dL   Nitrite, UA Negative Negative   Leukocytes, UA Large (3+) (A) Negative      ASSESSMENT & PLAN: Problem List Items Addressed This Visit       Genitourinary   Benign prostatic hyperplasia without lower urinary tract symptoms   Acute cystitis without hematuria - Primary    Asymptomatic.  Feeling better.  However urinalysis still showing significant amount of leukocytes.  Partially treated infection.  Will start Cipro 500 mg twice a day for 7 days.  Urine sent for culture today.      Relevant Medications   ciprofloxacin (CIPRO) 500 MG tablet   Other Relevant Orders   Urine Culture     Other   Recent urinary tract infection   Relevant Orders   Urine Culture   POCT urinalysis dipstick (Completed)   Patient Instructions  Benign Prostatic Hyperplasia Benign prostatic hyperplasia (BPH) is an enlarged prostate gland that is caused by the normal aging process. The prostate may get bigger as a man gets older. The condition is not caused by cancer. The prostate is a walnut-sized gland that is involved in the production of semen. It is located in front of the rectum and below the bladder. The bladder stores urine. The urethra carries stored urine out of the body. An enlarged prostate can press on the urethra. This can make it harder to pass urine. The buildup of urine in the bladder can cause infection. Back pressure and infection may progress to bladder damage and kidney (renal) failure. What are the causes? This condition is part of the normal aging process. However, not all men develop problems from this condition. If the prostate enlarges away from the urethra, urine flow will not be  blocked. If it enlarges toward the urethra and compresses it, there will be problems passing urine. What increases the risk? This condition  is more likely to develop in men older than 50 years. What are the signs or symptoms? Symptoms of this condition include: Getting up often during the night to urinate. Needing to urinate frequently during the day. Difficulty starting urine flow. Decrease in size and strength of your urine stream. Leaking (dribbling) after urinating. Inability to pass urine. This needs immediate treatment. Inability to completely empty your bladder. Pain when you pass urine. This is more common if there is also an infection. Urinary tract infection (UTI). How is this diagnosed? This condition is diagnosed based on your medical history, a physical exam, and your symptoms. Tests will also be done, such as: A post-void bladder scan. This measures any amount of urine that may remain in your bladder after you finish urinating. A digital rectal exam. In a rectal exam, your health care provider checks your prostate by putting a lubricated, gloved finger into your rectum to feel the back of your prostate gland. This exam detects the size of your gland and any abnormal lumps or growths. An exam of your urine (urinalysis). A prostate specific antigen (PSA) screening. This is a blood test used to screen for prostate cancer. An ultrasound. This test uses sound waves to electronically produce a picture of your prostate gland. Your health care provider may refer you to a specialist in kidney and prostate diseases (urologist). How is this treated? Once symptoms begin, your health care provider will monitor your condition (active surveillance or watchful waiting). Treatment for this condition will depend on the severity of your condition. Treatment may include: Observation and yearly exams. This may be the only treatment needed if your condition and symptoms are mild. Medicines to relieve your symptoms, including: Medicines to shrink the prostate. Medicines to relax the muscle of the prostate. Surgery in severe cases.  Surgery may include: Prostatectomy. In this procedure, the prostate tissue is removed completely through an open incision or with a laparoscope or robotics. Transurethral resection of the prostate (TURP). In this procedure, a tool is inserted through the opening at the tip of the penis (urethra). It is used to cut away tissue of the inner core of the prostate. The pieces are removed through the same opening of the penis. This removes the blockage. Transurethral incision (TUIP). In this procedure, small cuts are made in the prostate. This lessens the prostate's pressure on the urethra. Transurethral microwave thermotherapy (TUMT). This procedure uses microwaves to create heat. The heat destroys and removes a small amount of prostate tissue. Transurethral needle ablation (TUNA). This procedure uses radio frequencies to destroy and remove a small amount of prostate tissue. Interstitial laser coagulation ([place]). This procedure uses a laser to destroy and remove a small amount of prostate tissue. Transurethral electrovaporization (TUVP). This procedure uses electrodes to destroy and remove a small amount of prostate tissue. Prostatic urethral lift. This procedure inserts an implant to push the lobes of the prostate away from the urethra. Follow these instructions at home: Take over-the-counter and prescription medicines only as told by your health care provider. Monitor your symptoms for any changes. Contact your health care provider with any changes. Avoid drinking large amounts of liquid before going to bed or out in public. Avoid or reduce how much caffeine or alcohol you drink. Give yourself time when you urinate. Keep all follow-up visits. This is important. Contact a health care provider  if: You have unexplained back pain. Your symptoms do not get better with treatment. You develop side effects from the medicine you are taking. Your urine becomes very dark or has a bad smell. Your lower abdomen  becomes distended and you have trouble passing urine. Get help right away if: You have a fever or chills. You suddenly cannot urinate. You feel light-headed or very dizzy, or you faint. There are large amounts of blood or clots in your urine. Your urinary problems become hard to manage. You develop moderate to severe low back or flank pain. The flank is the side of your body between the ribs and the hip. These symptoms may be an emergency. Get help right away. Call 911. Do not wait to see if the symptoms will go away. Do not drive yourself to the hospital. Summary Benign prostatic hyperplasia (BPH) is an enlarged prostate that is caused by the normal aging process. It is not caused by cancer. An enlarged prostate can press on the urethra. This can make it hard to pass urine. This condition is more likely to develop in men older than 50 years. Get help right away if you suddenly cannot urinate. This information is not intended to replace advice given to you by your health care provider. Make sure you discuss any questions you have with your health care provider. Document Revised: 04/22/2021 Document Reviewed: 04/22/2021 Elsevier Patient Education  2022 Camino Tassajara, MD [facility]

## 2021-10-01 NOTE — Patient Instructions (Signed)

## 2021-10-02 LAB — URINE CULTURE: Result:: NO GROWTH

## 2021-10-29 DIAGNOSIS — R972 Elevated prostate specific antigen [PSA]: Secondary | ICD-10-CM | POA: Diagnosis not present

## 2021-10-29 DIAGNOSIS — N401 Enlarged prostate with lower urinary tract symptoms: Secondary | ICD-10-CM | POA: Diagnosis not present

## 2021-10-29 DIAGNOSIS — N138 Other obstructive and reflux uropathy: Secondary | ICD-10-CM | POA: Diagnosis not present

## 2021-11-04 DIAGNOSIS — R972 Elevated prostate specific antigen [PSA]: Secondary | ICD-10-CM | POA: Diagnosis not present

## 2021-11-04 DIAGNOSIS — N401 Enlarged prostate with lower urinary tract symptoms: Secondary | ICD-10-CM | POA: Diagnosis not present

## 2021-11-04 DIAGNOSIS — N138 Other obstructive and reflux uropathy: Secondary | ICD-10-CM | POA: Diagnosis not present

## 2021-11-06 ENCOUNTER — Other Ambulatory Visit: Payer: Self-pay

## 2021-11-06 ENCOUNTER — Ambulatory Visit: Payer: Medicare Other

## 2021-11-06 DIAGNOSIS — E1142 Type 2 diabetes mellitus with diabetic polyneuropathy: Secondary | ICD-10-CM

## 2021-11-06 DIAGNOSIS — M2011 Hallux valgus (acquired), right foot: Secondary | ICD-10-CM

## 2021-11-06 DIAGNOSIS — M2012 Hallux valgus (acquired), left foot: Secondary | ICD-10-CM

## 2021-11-07 NOTE — Progress Notes (Signed)
SITUATION Reason for Consult: Evaluation for Prefabricated Diabetic Shoes and Bilateral Custom Diabetic Inserts. Patient / Caregiver Report: Patient would like well fitting shoes  OBJECTIVE DATA: Patient History / Diagnosis:    ICD-10-CM   1. Diabetic peripheral neuropathy associated with type 2 diabetes mellitus (HCC)  E11.42     2. 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   GOALS OF SHOES AND INSOLES - Reduce shear and pressure - Reduce / Prevent callus formation - Reduce / Prevent ulceration - Protect the fragile healing compromised diabetic foot.  Patient would benefit from diabetic shoes and inserts as patient has diabetes mellitus and the patient has one or more of the following conditions: - History of partial or complete amputation of the foot - History of previous foot ulceration. - History of pre-ulcerative callus - Peripheral neuropathy with evidence of callus formation - Foot deformity - Poor circulation  ACTIONS PERFORMED Patient was casted for insoles via crush box and measured for shoes via brannock device. Procedure was explained and patient tolerated procedure well. All questions were answered and concerns addressed.  PLAN Patient is to ensure treating physician receives and completes diabetic paperwork. Casts and shoe order are to be held until paperwork is received. Once received patient is to be scheduled for fitting in four weeks.

## 2021-11-12 DIAGNOSIS — N401 Enlarged prostate with lower urinary tract symptoms: Secondary | ICD-10-CM | POA: Diagnosis not present

## 2021-11-12 DIAGNOSIS — N138 Other obstructive and reflux uropathy: Secondary | ICD-10-CM | POA: Diagnosis not present

## 2021-11-12 DIAGNOSIS — R972 Elevated prostate specific antigen [PSA]: Secondary | ICD-10-CM | POA: Diagnosis not present

## 2021-11-13 ENCOUNTER — Other Ambulatory Visit: Payer: Medicare Other

## 2021-11-16 ENCOUNTER — Ambulatory Visit: Payer: Medicare Other | Admitting: Emergency Medicine

## 2021-11-18 ENCOUNTER — Ambulatory Visit: Payer: Medicare Other | Admitting: Emergency Medicine

## 2021-11-23 DIAGNOSIS — R972 Elevated prostate specific antigen [PSA]: Secondary | ICD-10-CM | POA: Diagnosis not present

## 2021-11-23 DIAGNOSIS — H402231 Chronic angle-closure glaucoma, bilateral, mild stage: Secondary | ICD-10-CM | POA: Diagnosis not present

## 2021-11-24 ENCOUNTER — Encounter: Payer: Self-pay | Admitting: Emergency Medicine

## 2021-11-24 ENCOUNTER — Ambulatory Visit (INDEPENDENT_AMBULATORY_CARE_PROVIDER_SITE_OTHER): Payer: Medicare Other | Admitting: Emergency Medicine

## 2021-11-24 ENCOUNTER — Other Ambulatory Visit: Payer: Self-pay

## 2021-11-24 VITALS — BP 130/70 | HR 81 | Temp 99.2°F | Ht 67.0 in | Wt 157.1 lb

## 2021-11-24 DIAGNOSIS — E1159 Type 2 diabetes mellitus with other circulatory complications: Secondary | ICD-10-CM

## 2021-11-24 DIAGNOSIS — N400 Enlarged prostate without lower urinary tract symptoms: Secondary | ICD-10-CM

## 2021-11-24 DIAGNOSIS — E1169 Type 2 diabetes mellitus with other specified complication: Secondary | ICD-10-CM

## 2021-11-24 DIAGNOSIS — R972 Elevated prostate specific antigen [PSA]: Secondary | ICD-10-CM

## 2021-11-24 DIAGNOSIS — N401 Enlarged prostate with lower urinary tract symptoms: Secondary | ICD-10-CM | POA: Diagnosis not present

## 2021-11-24 DIAGNOSIS — E785 Hyperlipidemia, unspecified: Secondary | ICD-10-CM | POA: Diagnosis not present

## 2021-11-24 DIAGNOSIS — I152 Hypertension secondary to endocrine disorders: Secondary | ICD-10-CM

## 2021-11-24 DIAGNOSIS — N138 Other obstructive and reflux uropathy: Secondary | ICD-10-CM

## 2021-11-24 LAB — COMPREHENSIVE METABOLIC PANEL
ALT: 8 U/L (ref 0–53)
AST: 14 U/L (ref 0–37)
Albumin: 4.1 g/dL (ref 3.5–5.2)
Alkaline Phosphatase: 65 U/L (ref 39–117)
BUN: 17 mg/dL (ref 6–23)
CO2: 33 mEq/L — ABNORMAL HIGH (ref 19–32)
Calcium: 9.6 mg/dL (ref 8.4–10.5)
Chloride: 105 mEq/L (ref 96–112)
Creatinine, Ser: 1.28 mg/dL (ref 0.40–1.50)
GFR: 52.99 mL/min — ABNORMAL LOW (ref >60.00)
Glucose, Bld: 118 mg/dL — ABNORMAL HIGH (ref 70–99)
Potassium: 3.9 mEq/L (ref 3.5–5.1)
Sodium: 142 mEq/L (ref 135–145)
Total Bilirubin: 0.5 mg/dL (ref 0.2–1.2)
Total Protein: 7.4 g/dL (ref 6.0–8.3)

## 2021-11-24 LAB — POCT URINALYSIS DIPSTICK
Bilirubin, UA: NEGATIVE
Blood, UA: NEGATIVE
Glucose, UA: POSITIVE — AB
Ketones, UA: NEGATIVE
Nitrite, UA: NEGATIVE
Protein, UA: POSITIVE — AB
Spec Grav, UA: >=1.03 — AB (ref 1.010–1.025)
Urobilinogen, UA: 0.2 E.U./dL
pH, UA: 6 (ref 5.0–8.0)

## 2021-11-24 LAB — LIPID PANEL
Cholesterol: 162 mg/dL (ref 0–200)
HDL: 71 mg/dL (ref >39.00)
LDL Cholesterol: 83 mg/dL (ref 0–99)
NonHDL: 91.49
Total CHOL/HDL Ratio: 2
Triglycerides: 43 mg/dL (ref 0.0–149.0)
VLDL: 8.6 mg/dL (ref 0.0–40.0)

## 2021-11-24 LAB — HEMOGLOBIN A1C: Hgb A1c MFr Bld: 6.6 % — ABNORMAL HIGH (ref 4.6–6.5)

## 2021-11-24 NOTE — Assessment & Plan Note (Signed)
Well-controlled.  Continue Flomax 0.4 mg daily.  Tolerating it well.

## 2021-11-24 NOTE — Patient Instructions (Signed)

## 2021-11-24 NOTE — Assessment & Plan Note (Signed)
Stable.  Well-controlled hypertension.  Normal blood pressure readings at home. Continue Accupril 20 mg daily and amlodipine 10 mg daily. Lab Results  Component Value Date   HGBA1C 6.4 (A) 09/14/2021  Well-controlled diabetes.  Continue glimepiride 2 mg daily.

## 2021-11-24 NOTE — Assessment & Plan Note (Signed)
Stable.  Diet and nutrition discussed. Continue atorvastatin 20 mg daily. 

## 2021-11-24 NOTE — Progress Notes (Signed)
Tony Price 81 y.o.   Chief Complaint  Patient presents with   Follow-up    HISTORY OF PRESENT ILLNESS: This is a 81 y.o. male here for follow-up of BPH and elevated PSA. Recently seen by urologist and started on Flomax 0.4 mg daily.  Doing well. Negative work-up for cancer.  Denies dysuria, hematuria, or significant lower urinary tract symptoms. Recent urologist office visit note as follows:  Assessment: 1. BPH with obstruction/lower urinary tract symptoms  2. Elevated PSA   1. MRI showed only a very large prostate gland at 113 cc. No lesions to biopsy or suspicious for cancer. He has an IPSS score of 11 points but pleased with the quality of life. We will start him on Flomax 0,4 mg daily for 6 months. He will follow in 6 months with a PSA total and free. I spent 30 minutes face to face with the patient, over half in discussion of the diagnosis and the importance of compliance with the treatment plan as well as counselling the patient and coordinating follow-up care. 1. Elevated PSA: Patient has persistent elevated PSA and this could be due to severely enlarged prostate gland at 113 cc in the MRI. He will follow in 6 months with PSA total and free.   Plan: 1. FU in 6 months 2. Flomax 0,4 mg 1 daily PSA total and free in 6 months  Electronically signed by: Ezzie Dural, MD 11/12/2021 8:58 AM   Electronically signed by: Ezzie Dural, MD 11/12/21 [phone]  HPI   Prior to Admission medications   Medication Sig Start Date End Date Taking? Authorizing Provider  amLODipine (NORVASC) 10 MG tablet TAKE 1 TABLET BY MOUTH EVERY DAY 09/20/21   Horald Pollen, MD  Ascorbic Acid (VITAMIN C) 500 MG CAPS See admin instructions.    [provider]  aspirin 81 MG EC tablet 1 tablet    [provider]  atorvastatin (LIPITOR) 20 MG tablet Take 1 tablet (20 mg total) by mouth daily. 08/14/21   Horald Pollen, MD  augmented  betamethasone dipropionate (DIPROLENE-AF) 0.05 % ointment Apply topically 2 (two) times daily. 08/07/21   [provider]  blood glucose meter kit and supplies KIT 1 touch ultra meter.   Check blood sugar once daily E11.9 07/14/17   [name], MD  Blood Glucose Monitoring Suppl (ONE TOUCH ULTRA SYSTEM KIT) w/Device KIT Test blood sugar once daily. Dx: E11.9 07/01/16   [name], MD  carboxymethylcellulose (REFRESH PLUS) 0.5 % SOLN 1 drop 2 (two) times daily as needed. Into both eyes    [provider]  cephALEXin (KEFLEX) 500 MG capsule Take 1 capsule (500 mg total) by mouth 3 (three) times daily. 09/24/21   Domenic Moras, PA-C  glimepiride (AMARYL) 2 MG tablet TAKE 1 TABLET BY MOUTH TWICE A DAY 09/20/21   Horald Pollen, MD  glucose blood test strip Test blood sugar once daily. Dx: E11.9 07/04/17   [name], MD  ipratropium (ATROVENT) 0.06 % nasal spray Place 1-2 sprays into both nostrils 3 (three) times daily. As needed for nasal congestion 09/23/20   [name], MD  Lancets [facility] ULTRASOFT) lancets Test blood sugar once daily. Dx: E11.9 07/04/17   [name], MD  latanoprost (XALATAN) 0.005 % ophthalmic solution 1 drop at bedtime. 08/31/19   [provider]  neomycin-polymyxin b-dexamethasone (MAXITROL) 3.5-10000-0.1 OINT SMARTSIG:Topical 06/26/20   [provider]  quinapril (ACCUPRIL) 20 MG tablet Take 1  tablet (20 mg total) by mouth daily. 08/14/21   Horald Pollen, MD  triamcinolone cream (KENALOG) 0.1 % APPLY TO AFFECTED AREA TWICE A DAY 07/20/21   Horald Pollen, MD    No Known Allergies  Patient Active Problem List   Diagnosis Date Noted   Acute cystitis without hematuria 10/01/2021   Recent urinary tract infection 10/01/2021   Erectile dysfunction due to arterial insufficiency 09/14/2021   Benign prostatic hyperplasia without lower urinary tract symptoms 07/27/2021   Personal history of colonic  polyps 11/04/2020   Diabetic retinopathy ([place]) 03/31/2016   Dyslipidemia associated with type 2 diabetes mellitus ([place]) 11/17/2014   Hypertension associated with diabetes ([place]) 11/17/2014    Past Medical History:  Diagnosis Date   Chest pain    Diabetes mellitus    Hypercholesterolemia    Hypertension    Kidney stones     Past Surgical History:  Procedure Laterality Date   CATARACT EXTRACTION     EYE SURGERY     PROSTATE BIOPSY     ROTATOR CUFF REPAIR     Right    Social History   Socioeconomic History   Marital status: Divorced    Spouse name: Not on file   Number of children: 3   Years of education: Not on file   Highest education level: Not on file  Occupational History   Not on file  Tobacco Use   Smoking status: Former    Years: 2.00    Types: Cigarettes   Smokeless tobacco: Never  Substance and Sexual Activity   Alcohol use: No    Alcohol/week: 0.0 standard drinks   Drug use: No   Sexual activity: Not on file  Other Topics Concern   Not on file  Social History Narrative   Not on file   Social Determinants of Health   Financial Resource Strain: Low Risk    Difficulty of Paying Living Expenses: Not hard at all  Food Insecurity: No Food Insecurity   Worried About Charity fundraiser in the Last Year: Never true   Arboriculturist in the Last Year: Never true  Transportation Needs: No Transportation Needs   Lack of Transportation (Medical): No   Lack of Transportation (Non-Medical): No  Physical Activity: Sufficiently Active   Days of Exercise per Week: 7 days   Minutes of Exercise per Session: 60 min  Stress: No Stress Concern Present   Feeling of Stress : Not at all  Social Connections: Moderately Isolated   Frequency of Communication with Friends and Family: More than three times a week   Frequency of Social Gatherings with Friends and Family: Twice a week   Attends Religious Services: More than 4 times per year   Active Member of [employer] or  Organizations: No   Attends Archivist Meetings: Never   Marital Status: Widowed  Human resources officer Violence: Not At Risk   Fear of Current or Ex-Partner: No   Emotionally Abused: No   Physically Abused: No   Sexually Abused: No    Family History  Family history unknown: Yes     Review of Systems  Constitutional: Negative.  Negative for chills and fever.  HENT: Negative.  Negative for congestion and sore throat.   Respiratory: Negative.  Negative for cough and shortness of breath.   Cardiovascular: Negative.  Negative for chest pain and palpitations.  Gastrointestinal: Negative.  Negative for abdominal pain, diarrhea, nausea and vomiting.  Genitourinary: Negative.  Negative for dysuria,  flank pain, frequency and hematuria.  Musculoskeletal: Negative.   Skin: Negative.  Negative for rash.  Neurological: Negative.  Negative for dizziness and headaches.  All other systems reviewed and are negative.   Physical Exam Vitals reviewed.  Constitutional:      Appearance: Normal appearance.  HENT:     Head: Normocephalic.  Eyes:     Extraocular Movements: Extraocular movements intact.     Pupils: Pupils are equal, round, and reactive to light.  Cardiovascular:     Rate and Rhythm: Normal rate and regular rhythm.     Pulses: Normal pulses.     Heart sounds: Normal heart sounds.  Pulmonary:     Effort: Pulmonary effort is normal.     Breath sounds: Normal breath sounds.  Abdominal:     Palpations: Abdomen is soft.     Tenderness: There is no abdominal tenderness.  Musculoskeletal:     Cervical back: No tenderness.  Lymphadenopathy:     Cervical: No cervical adenopathy.  Skin:    General: Skin is warm and dry.     Capillary Refill: Capillary refill takes less than 2 seconds.  Neurological:     General: No focal deficit present.     Mental Status: He is alert and oriented to person, place, and time.  Psychiatric:        Mood and Affect: Mood normal.         Behavior: Behavior normal.    Results for orders placed or performed in visit on 11/24/21 (from the past 24 hour(s))  POCT Urinalysis Dipstick     Status: Abnormal   Collection Time: 11/24/21  1:53 PM  Result Value Ref Range   Color, UA yellow    Clarity, UA clear    Glucose, UA Positive (A) Negative   Bilirubin, UA negative    Ketones, UA negative    Spec Grav, UA >=1.030 (A) 1.010 - 1.025   Blood, UA negative    pH, UA 6.0 5.0 - 8.0   Protein, UA Positive (A) Negative   Urobilinogen, UA 0.2 0.2 or 1.0 E.U./dL   Nitrite, UA negative    Leukocytes, UA Trace (A) Negative   Appearance clear    Odor      ASSESSMENT & PLAN: Problem List Items Addressed This Visit       Cardiovascular and Mediastinum   Hypertension associated with diabetes ([place])    Stable.  Well-controlled hypertension.  Normal blood pressure readings at home. Continue Accupril 20 mg daily and amlodipine 10 mg daily. Lab Results  Component Value Date   HGBA1C 6.4 (A) 09/14/2021  Well-controlled diabetes.  Continue glimepiride 2 mg daily.       Relevant Orders   Hemoglobin A1c   Comprehensive metabolic panel     Endocrine   Dyslipidemia associated with type 2 diabetes mellitus (HCC)    Stable.  Diet and nutrition discussed.  Continue atorvastatin 20 mg daily.       Relevant Orders   Lipid panel     Genitourinary   BPH with obstruction/lower urinary tract symptoms - Primary    Well-controlled.  Continue Flomax 0.4 mg daily.  Tolerating it well.      Relevant Orders   POCT Urinalysis Dipstick (Completed)   Urine Culture   Other Visit Diagnoses     BPH with elevated PSA          Patient Instructions  Benign Prostatic Hyperplasia Benign prostatic hyperplasia (BPH) is an enlarged prostate gland that is caused  by the normal aging process. The prostate may get bigger as a man gets older. The condition is not caused by cancer. The prostate is a walnut-sized gland that is involved in the  production of semen. It is located in front of the rectum and below the bladder. The bladder stores urine. The urethra carries stored urine out of the body. An enlarged prostate can press on the urethra. This can make it harder to pass urine. The buildup of urine in the bladder can cause infection. Back pressure and infection may progress to bladder damage and kidney (renal) failure. What are the causes? This condition is part of the normal aging process. However, not all men develop problems from this condition. If the prostate enlarges away from the urethra, urine flow will not be blocked. If it enlarges toward the urethra and compresses it, there will be problems passing urine. What increases the risk? This condition is more likely to develop in men older than 50 years. What are the signs or symptoms? Symptoms of this condition include: Getting up often during the night to urinate. Needing to urinate frequently during the day. Difficulty starting urine flow. Decrease in size and strength of your urine stream. Leaking (dribbling) after urinating. Inability to pass urine. This needs immediate treatment. Inability to completely empty your bladder. Pain when you pass urine. This is more common if there is also an infection. Urinary tract infection (UTI). How is this diagnosed? This condition is diagnosed based on your medical history, a physical exam, and your symptoms. Tests will also be done, such as: A post-void bladder scan. This measures any amount of urine that may remain in your bladder after you finish urinating. A digital rectal exam. In a rectal exam, your health care provider checks your prostate by putting a lubricated, gloved finger into your rectum to feel the back of your prostate gland. This exam detects the size of your gland and any abnormal lumps or growths. An exam of your urine (urinalysis). A prostate specific antigen (PSA) screening. This is a blood test used to screen for  prostate cancer. An ultrasound. This test uses sound waves to electronically produce a picture of your prostate gland. Your health care provider may refer you to a specialist in kidney and prostate diseases (urologist). How is this treated? Once symptoms begin, your health care provider will monitor your condition (active surveillance or watchful waiting). Treatment for this condition will depend on the severity of your condition. Treatment may include: Observation and yearly exams. This may be the only treatment needed if your condition and symptoms are mild. Medicines to relieve your symptoms, including: Medicines to shrink the prostate. Medicines to relax the muscle of the prostate. Surgery in severe cases. Surgery may include: Prostatectomy. In this procedure, the prostate tissue is removed completely through an open incision or with a laparoscope or robotics. Transurethral resection of the prostate (TURP). In this procedure, a tool is inserted through the opening at the tip of the penis (urethra). It is used to cut away tissue of the inner core of the prostate. The pieces are removed through the same opening of the penis. This removes the blockage. Transurethral incision (TUIP). In this procedure, small cuts are made in the prostate. This lessens the prostate's pressure on the urethra. Transurethral microwave thermotherapy (TUMT). This procedure uses microwaves to create heat. The heat destroys and removes a small amount of prostate tissue. Transurethral needle ablation (TUNA). This procedure uses radio frequencies to destroy and remove  a small amount of prostate tissue. Interstitial laser coagulation ([place]). This procedure uses a laser to destroy and remove a small amount of prostate tissue. Transurethral electrovaporization (TUVP). This procedure uses electrodes to destroy and remove a small amount of prostate tissue. Prostatic urethral lift. This procedure inserts an implant to push the lobes  of the prostate away from the urethra. Follow these instructions at home: Take over-the-counter and prescription medicines only as told by your health care provider. Monitor your symptoms for any changes. Contact your health care provider with any changes. Avoid drinking large amounts of liquid before going to bed or out in public. Avoid or reduce how much caffeine or alcohol you drink. Give yourself time when you urinate. Keep all follow-up visits. This is important. Contact a health care provider if: You have unexplained back pain. Your symptoms do not get better with treatment. You develop side effects from the medicine you are taking. Your urine becomes very dark or has a bad smell. Your lower abdomen becomes distended and you have trouble passing urine. Get help right away if: You have a fever or chills. You suddenly cannot urinate. You feel light-headed or very dizzy, or you faint. There are large amounts of blood or clots in your urine. Your urinary problems become hard to manage. You develop moderate to severe low back or flank pain. The flank is the side of your body between the ribs and the hip. These symptoms may be an emergency. Get help right away. Call 911. Do not wait to see if the symptoms will go away. Do not drive yourself to the hospital. Summary Benign prostatic hyperplasia (BPH) is an enlarged prostate that is caused by the normal aging process. It is not caused by cancer. An enlarged prostate can press on the urethra. This can make it hard to pass urine. This condition is more likely to develop in men older than 50 years. Get help right away if you suddenly cannot urinate. This information is not intended to replace advice given to you by your health care provider. Make sure you discuss any questions you have with your health care provider. Document Revised: 04/22/2021 Document Reviewed: 04/22/2021 Elsevier Patient Education  2022 [place], MD [facility]

## 2021-11-26 LAB — URINE CULTURE: Result:: NO GROWTH

## 2021-11-30 ENCOUNTER — Telehealth: Payer: Self-pay

## 2021-11-30 DIAGNOSIS — R351 Nocturia: Secondary | ICD-10-CM | POA: Diagnosis not present

## 2021-11-30 DIAGNOSIS — N401 Enlarged prostate with lower urinary tract symptoms: Secondary | ICD-10-CM | POA: Diagnosis not present

## 2021-11-30 DIAGNOSIS — R972 Elevated prostate specific antigen [PSA]: Secondary | ICD-10-CM | POA: Diagnosis not present

## 2021-11-30 NOTE — Telephone Encounter (Signed)
Pt called in for results. I advised pt of Dr. Mitchel Honour note regarding labs results. Dr. Mitchel Honour states blood results look pretty good with acceptable values and no significant changes from previous ones.  Kidney function is at baseline's and lipid profile is normal.  Urine is concentrated, you could use better hydration so drink more fluids. Diabetes is well controlled and Culture was Neg.  Pt was understanding and had no further questions.  FYI

## 2021-12-07 ENCOUNTER — Encounter: Payer: Self-pay | Admitting: Podiatry

## 2021-12-07 ENCOUNTER — Ambulatory Visit (INDEPENDENT_AMBULATORY_CARE_PROVIDER_SITE_OTHER): Payer: Medicare Other | Admitting: Podiatry

## 2021-12-07 ENCOUNTER — Other Ambulatory Visit: Payer: Self-pay

## 2021-12-07 DIAGNOSIS — M79675 Pain in left toe(s): Secondary | ICD-10-CM | POA: Diagnosis not present

## 2021-12-07 DIAGNOSIS — E119 Type 2 diabetes mellitus without complications: Secondary | ICD-10-CM | POA: Diagnosis not present

## 2021-12-07 DIAGNOSIS — M2012 Hallux valgus (acquired), left foot: Secondary | ICD-10-CM

## 2021-12-07 DIAGNOSIS — M2011 Hallux valgus (acquired), right foot: Secondary | ICD-10-CM

## 2021-12-07 DIAGNOSIS — M79674 Pain in right toe(s): Secondary | ICD-10-CM

## 2021-12-07 DIAGNOSIS — B351 Tinea unguium: Secondary | ICD-10-CM

## 2021-12-13 NOTE — Progress Notes (Signed)
ANNUAL DIABETIC FOOT EXAM  Subjective: Tony Price presents today for annual diabetic foot examination.  Patient relates 10 year h/o diabetes.  Patient denies any h/o foot wounds.  Patient denies any numbness, tingling, burning, or pins/needle sensation in feet.  Patient's blood sugar was 107 mg/dl today.   Patient did not check blood glucose this morning.  Patient does not monitor blood glucose daily.  Risk factors:  former smoker, diabetes, HTN, dyslipidemia.  Tony Pollen, MD is patient's PCP. Last visit was November 24, 2021.  Past Medical History:  Diagnosis Date   Chest pain    Diabetes mellitus    Hypercholesterolemia    Hypertension    Kidney stones    Patient Active Problem List   Diagnosis Date Noted   Acute cystitis without hematuria 10/01/2021   Recent urinary tract infection 10/01/2021   Erectile dysfunction due to arterial insufficiency 09/14/2021   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   Personal history of colonic polyps 11/04/2020   Diabetic retinopathy ([place]) 03/31/2016   Dyslipidemia associated with type 2 diabetes mellitus ([place]) 11/17/2014   Hypertension associated with diabetes ([place]) 11/17/2014   Past Surgical History:  Procedure Laterality Date   CATARACT EXTRACTION     EYE SURGERY     PROSTATE BIOPSY     ROTATOR CUFF REPAIR     Right   Current Outpatient Medications on File Prior to Visit  Medication Sig Dispense Refill   amLODipine (NORVASC) 10 MG tablet TAKE 1 TABLET BY MOUTH EVERY DAY 90 tablet 1   amLODipine (NORVASC) 5 MG tablet Take 1 tablet by mouth daily.     Ascorbic Acid (VITAMIN C) 500 MG CAPS See admin instructions.     Ascorbic Acid 500 MG CHEW See admin instructions.     Aspirin (VAZALORE) 81 MG CAPS 1 tablet     aspirin 81 MG EC tablet 1 tablet     atorvastatin (LIPITOR) 20 MG tablet Take 1 tablet (20 mg total) by mouth daily. 90 tablet 1   atorvastatin (LIPITOR) 20 MG tablet Take 1  tablet by mouth daily.     augmented betamethasone dipropionate (DIPROLENE-AF) 0.05 % ointment Apply topically 2 (two) times daily.     blood glucose meter kit and supplies KIT 1 touch ultra meter.   Check blood sugar once daily E11.9 1 each 0   Blood Glucose Monitoring Suppl (ONE TOUCH ULTRA SYSTEM KIT) w/Device KIT Test blood sugar once daily. Dx: E11.9 1 each 0   carboxymethylcellulose (REFRESH PLUS) 0.5 % SOLN 1 drop 2 (two) times daily as needed. Into both eyes     carboxymethylcellulose (REFRESH PLUS) 0.5 % SOLN 1 drop 2 (two)  times daily as needed. Into both eyes     cephALEXin (KEFLEX) 500 MG capsule Take 1 capsule (500 mg total) by mouth 3 (three) times daily. 20 capsule 0   glimepiride (AMARYL) 2 MG tablet TAKE 1 TABLET BY MOUTH TWICE A DAY 180 tablet 1   glucose blood test strip Test blood sugar once daily. Dx: E11.9 100 each 3   ipratropium (ATROVENT) 0.06 % nasal spray Place 1-2 sprays into both nostrils 3 (three) times daily. As needed for nasal congestion 15 mL 5   Lancets (ONETOUCH ULTRASOFT) lancets Test blood sugar once daily. Dx: E11.9 100 each 3   latanoprost (XALATAN) 0.005 % ophthalmic solution 1 drop at bedtime.     neomycin-polymyxin b-dexamethasone (MAXITROL) 3.5-10000-0.1 OINT SMARTSIG:Topical     quinapril (ACCUPRIL) 20 MG tablet  Take 1 tablet (20 mg total) by mouth daily. 90 tablet 1   tamsulosin (FLOMAX) 0.4 MG CAPS capsule Take 0.4 mg by mouth daily.     triamcinolone cream (KENALOG) 0.1 % APPLY TO AFFECTED AREA TWICE A DAY 30 g 0   No current facility-administered medications on file prior to visit.    No Known Allergies Social History   Occupational History   Not on file  Tobacco Use   Smoking status: Former    Years: 2.00    Types: Cigarettes   Smokeless tobacco: Never  Substance and Sexual Activity   Alcohol use: No    Alcohol/week: 0.0 standard drinks   Drug use: No   Sexual activity: Not on file   Family History  Family history unknown: Yes    Immunization History  Administered Date(s) Administered   Fluad Quad(high Dose 65+) 07/06/2021   PFIZER(Purple Top)SARS-COV-2 Vaccination 12/03/2019, 12/29/2019, 08/02/2020   Pneumococcal Polysaccharide-23 02/21/2020   Tdap 10/18/2010     Review of Systems: Negative except as noted in the HPI.   Objective: There were no vitals filed for this visit.  Tony Price is a pleasant 81 y.o. male in NAD. AAO X 3.  Vascular Examination: CFT <3 seconds b/l LE. Palpable DP/PT pulses b/l LE. Digital hair sparse b/l. Skin temperature gradient WNL b/l. No pain with calf compression b/l. No edema noted b/l. No cyanosis or clubbing noted b/l LE.  Dermatological Examination: Pedal integument with normal turgor, texture and tone b/l LE. No open wounds b/l. No interdigital macerations b/l. Toenails 1-5 left, R hallux, R 2nd toe, R 4th toe, and R 5th toe elongated, thickened, discolored with subungual debris. +Tenderness with dorsal palpation of nailplates. No hyperkeratotic or porokeratotic lesions present. Anonychia noted R 3rd toe. Nailbed(s) epithelialized.   Neurological Examination: Protective sensation intact 5/5 intact bilaterally with 10g monofilament b/l. Vibratory sensation intact b/l. Deep tendon reflexes normal b/l.   Musculoskeletal Examination: Normal muscle strength 5/5 to all lower extremity muscle groups bilaterally. HAV with bunion deformity noted b/l LE.[name] No pain, crepitus or joint limitation noted with ROM b/l LE.  Patient ambulates independently without assistive aids.  Footwear Assessment: Does the patient wear appropriate shoes? Wears diabetic shoes. Does the patient need inserts/orthotics? Yes.  Hemoglobin A1C Latest Ref Rng & Units 11/24/2021 09/14/2021 03/09/2021  HGBA1C 4.6 - 6.5 % 6.6(H) 6.4(A) 6.5(A)  Some recent data might be hidden   Assessment: 1. Pain due to onychomycosis of toenails of both feet   2. Hallux valgus, acquired, bilateral   3. Controlled type 2  diabetes mellitus without complication, without long-term current use of insulin ([place])   4. Encounter for diabetic foot exam ([place])      ADA Risk Categorization: Low Risk :  Patient has all of the following: Intact protective sensation No prior foot ulcer  No severe deformity Pedal pulses present  Plan: -Diabetic foot examination performed today. -Continue foot and shoe inspections daily. Monitor blood glucose per PCP/Endocrinologist's recommendations. -Continue diabetic shoes daily. -Toenails 1-5 left, R hallux, R 2nd toe, R 4th toe, and R 5th toe debrided in length and girth without iatrogenic bleeding with sterile nail nipper and dremel.  -Patient/POA to call should there be question/concern in the interim. Return in about 3 months (around 03/06/2022).  Marzetta Board, Tony Price

## 2021-12-18 ENCOUNTER — Telehealth: Payer: Self-pay

## 2021-12-18 NOTE — Telephone Encounter (Signed)
Casts sent to [facility] [employer] - HOLD FOR CMN ?

## 2022-01-05 ENCOUNTER — Telehealth: Payer: Self-pay | Admitting: *Deleted

## 2022-01-05 ENCOUNTER — Other Ambulatory Visit: Payer: Self-pay

## 2022-01-05 ENCOUNTER — Ambulatory Visit (INDEPENDENT_AMBULATORY_CARE_PROVIDER_SITE_OTHER): Payer: Medicare Other | Admitting: Emergency Medicine

## 2022-01-05 ENCOUNTER — Encounter: Payer: Self-pay | Admitting: Emergency Medicine

## 2022-01-05 VITALS — BP 128/80 | HR 69 | Temp 98.0°F | Ht 67.0 in | Wt 157.0 lb

## 2022-01-05 DIAGNOSIS — E1169 Type 2 diabetes mellitus with other specified complication: Secondary | ICD-10-CM | POA: Diagnosis not present

## 2022-01-05 DIAGNOSIS — R972 Elevated prostate specific antigen [PSA]: Secondary | ICD-10-CM

## 2022-01-05 DIAGNOSIS — N400 Enlarged prostate without lower urinary tract symptoms: Secondary | ICD-10-CM

## 2022-01-05 DIAGNOSIS — I152 Hypertension secondary to endocrine disorders: Secondary | ICD-10-CM | POA: Diagnosis not present

## 2022-01-05 DIAGNOSIS — E785 Hyperlipidemia, unspecified: Secondary | ICD-10-CM

## 2022-01-05 DIAGNOSIS — Z87440 Personal history of urinary (tract) infections: Secondary | ICD-10-CM

## 2022-01-05 DIAGNOSIS — E1159 Type 2 diabetes mellitus with other circulatory complications: Secondary | ICD-10-CM

## 2022-01-05 DIAGNOSIS — N138 Other obstructive and reflux uropathy: Secondary | ICD-10-CM

## 2022-01-05 DIAGNOSIS — N401 Enlarged prostate with lower urinary tract symptoms: Secondary | ICD-10-CM | POA: Diagnosis not present

## 2022-01-05 LAB — POCT URINALYSIS DIP (MANUAL ENTRY)
Bilirubin, UA: NEGATIVE
Blood, UA: NEGATIVE
Glucose, UA: NEGATIVE mg/dL
Ketones, POC UA: NEGATIVE mg/dL
Nitrite, UA: NEGATIVE
Protein Ur, POC: NEGATIVE mg/dL
Spec Grav, UA: 1.025 (ref 1.010–1.025)
Urobilinogen, UA: 0.2 E.U./dL
pH, UA: 6 (ref 5.0–8.0)

## 2022-01-05 MED ORDER — CIPROFLOXACIN HCL 500 MG PO TABS: 500.0000 mg | ORAL_TABLET | Freq: Two times a day (BID) | ORAL | 0 refills | Status: AC

## 2022-01-05 MED ORDER — TAMSULOSIN HCL 0.4 MG PO CAPS: 0.4000 mg | ORAL_CAPSULE | Freq: Every day | ORAL | 3 refills | Status: AC

## 2022-01-05 NOTE — Progress Notes (Signed)
Tony Price ?81 y.o. ? ? ?Chief Complaint  ?Patient presents with  ? Diabetes  ?  Diabetic and BP f/u  ? ? ?HISTORY OF PRESENT ILLNESS: ?This is a 81 y.o. male complaining of postvoid dripping. ?Diabetic and hypertensive. ?History of BPH but presently not taking Flomax as prescribed. ?No other complaints or medical concerns today. ?Lab Results  ?Component Value Date  ? HGBA1C 6.6 (H) 11/24/2021  ? ?BP Readings from Last 3 Encounters:  ?11/24/21 130/70  ?10/01/21 130/70  ?09/24/21 137/72  ? ?Wt Readings from Last 3 Encounters:  ?11/24/21 157 lb 2 oz (71.3 kg)  ?10/01/21 156 lb (70.8 kg)  ?09/24/21 155 lb (70.3 kg)  ? ? ? ?Diabetes ?Pertinent negatives for hypoglycemia include no dizziness or headaches. Pertinent negatives for diabetes include no chest pain.  ? ? ?Prior to Admission medications   ?Medication Sig Start Date End Date Taking? Authorizing Provider  ?amLODipine (NORVASC) 10 MG tablet TAKE 1 TABLET BY MOUTH EVERY DAY 09/20/21  Yes Mitchel Delduca, Ines Bloomer, MD  ?amLODipine (NORVASC) 5 MG tablet Take 1 tablet by mouth daily.   Yes [provider]  ?Ascorbic Acid (VITAMIN C) 500 MG CAPS See admin instructions.   Yes [provider]  ?Ascorbic Acid 500 MG CHEW See admin instructions.   Yes [provider]  ?Aspirin (VAZALORE) 81 MG CAPS 1 tablet   Yes [provider]  ?aspirin 81 MG EC tablet 1 tablet   Yes [provider]  ?atorvastatin (LIPITOR) 20 MG tablet Take 1 tablet (20 mg total) by mouth daily. 08/14/21  Yes [place], Ines Bloomer, MD  ?atorvastatin (LIPITOR) 20 MG tablet Take 1 tablet by mouth daily.   Yes [provider]  ?augmented betamethasone dipropionate (DIPROLENE-AF) 0.05 % ointment Apply topically 2 (two) times daily. 08/07/21  Yes [provider]  ?blood glucose meter kit and supplies KIT 1 touch ultra meter.   Check blood sugar once daily ?E11.9 07/14/17  Yes [name], MD  ?Blood Glucose Monitoring Suppl (ONE TOUCH  ULTRA SYSTEM KIT) w/Device KIT Test blood sugar once daily. Dx: E11.9 07/01/16  Yes [name], MD  ?carboxymethylcellulose (REFRESH PLUS) 0.5 % SOLN 1 drop 2 (two) times daily as needed. Into both eyes   Yes [provider]  ?carboxymethylcellulose (REFRESH PLUS) 0.5 % SOLN 1 drop 2 (two)  times daily as needed. Into both eyes   Yes [provider]  ?cephALEXin (KEFLEX) 500 MG capsule Take 1 capsule (500 mg total) by mouth 3 (three) times daily. 09/24/21  Yes Domenic Moras, PA-C  ?glimepiride (AMARYL) 2 MG tablet TAKE 1 TABLET BY MOUTH TWICE A DAY 09/20/21  Yes Maudell Stanbrough, Ines Bloomer, MD  ?glucose blood test strip Test blood sugar once daily. Dx: E11.9 07/04/17  Yes [name], MD  ?ipratropium (ATROVENT) 0.06 % nasal spray Place 1-2 sprays into both nostrils 3 (three) times daily. As needed for nasal congestion 09/23/20  Yes [name], MD  ?Lancets [facility] ULTRASOFT) lancets Test blood sugar once daily. Dx: E11.9 07/04/17  Yes [name], MD  ?latanoprost (XALATAN) 0.005 % ophthalmic solution 1 drop at bedtime. 08/31/19  Yes [provider]  ?neomycin-polymyxin b-dexamethasone (MAXITROL) 3.5-10000-0.1 OINT SMARTSIG:Topical 06/26/20  Yes [provider]  ?quinapril (ACCUPRIL) 20 MG tablet Take 1 tablet (20 mg total) by mouth daily. 08/14/21  Yes Chonita Gadea, Ines Bloomer, MD  ?tamsulosin (FLOMAX) 0.4 MG CAPS capsule Take 0.4 mg by mouth daily. 11/12/21  Yes [provider]  ?triamcinolone cream (KENALOG) 0.1 % APPLY TO AFFECTED AREA TWICE A DAY 07/20/21  Yes Horald Pollen, MD  ? ? ?No Known Allergies ? ?Patient Active Problem List  ? Diagnosis Date Noted  ? Acute cystitis without hematuria 10/01/2021  ? Recent urinary tract infection 10/01/2021  ? Erectile dysfunction due to arterial insufficiency 09/14/2021  ? 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  ? Personal history of colonic polyps 11/04/2020  ? Diabetic retinopathy ([place])  03/31/2016  ? Dyslipidemia associated with type 2 diabetes mellitus ([place]) 11/17/2014  ? Hypertension associated with diabetes ([place]) 11/17/2014  ? ? ?Past Medical History:  ?Diagnosis Date  ? Chest pain   ? Diabetes mellitus   ? Hypercholesterolemia   ? Hypertension   ? Kidney stones   ? ? ?Past Surgical History:  ?Procedure Laterality Date  ? CATARACT EXTRACTION    ? EYE SURGERY    ? PROSTATE BIOPSY    ? ROTATOR CUFF REPAIR    ? Right  ? ? ?Social History  ? ?Socioeconomic History  ? Marital status: Divorced  ?  Spouse name: Not on file  ? Number of children: 3  ? Years of education: Not on file  ? Highest education level: Not on file  ?Occupational History  ? Not on file  ?Tobacco Use  ? Smoking status: Former  ?  Years: 2.00  ?  Types: Cigarettes  ? Smokeless tobacco: Never  ?Substance and Sexual Activity  ? Alcohol use: No  ?  Alcohol/week: 0.0 standard drinks  ? Drug use: No  ? Sexual activity: Not on file  ?Other Topics Concern  ? Not on file  ?Social History Narrative  ? Not on file  ? ?Social Determinants of Health  ? ?Financial Resource Strain: Low Risk   ? Difficulty of Paying Living Expenses: Not hard at all  ?Food Insecurity: No Food Insecurity  ? Worried About Charity fundraiser in the Last Year: Never true  ? Ran Out of Food in the Last Year: Never true  ?Transportation Needs: No Transportation Needs  ? Lack of Transportation (Medical): No  ? Lack of Transportation (Non-Medical): No  ?Physical Activity: Sufficiently Active  ? Days of Exercise per Week: 7 days  ? Minutes of Exercise per Session: 60 min  ?Stress: No Stress Concern Present  ? Feeling of Stress : Not at all  ?Social Connections: Moderately Isolated  ? Frequency of Communication with Friends and Family: More than three times a week  ? Frequency of Social Gatherings with Friends and Family: Twice a week  ? Attends Religious Services: More than 4 times per year  ? Active Member of Clubs or Organizations: No  ? Attends Archivist  Meetings: Never  ? Marital Status: Widowed  ?Intimate Partner Violence: Not At Risk  ? Fear of Current or Ex-Partner: No  ? Emotionally Abused: No  ? Physically Abused: No  ? Sexually Abused: No  ? ? ?Family History  ?Family history unknown: Yes  ? ? ? ?Review of Systems  ?Constitutional: Negative.  Negative for chills and fever.  ?HENT: Negative.  Negative for congestion and sore throat.   ?Respiratory: Negative.  Negative for cough and shortness of breath.   ?Cardiovascular: Negative.  Negative for chest pain and palpitations.  ?Gastrointestinal:  Negative for abdominal pain, diarrhea, nausea and vomiting.  ?Genitourinary: Negative.  Negative for dysuria and hematuria.  ?     Post void dribbling  ?Skin: Negative.  Negative  for rash.  ?Neurological: Negative.  Negative for dizziness and headaches.  ?All other systems reviewed and are negative. ? ?Today's Vitals  ? 01/05/22 1536  ?BP: 128/80  ?Pulse: 69  ?Temp: 98 ?F (36.7 ?C)  ?TempSrc: Oral  ?SpO2: 95%  ?Weight: 157 lb (71.2 kg)  ?Height: '5\' 7"'  (1.702 m)  ? ?Body mass index is 24.59 kg/m?. ? ?Physical Exam ?Vitals reviewed.  ?Constitutional:   ?   Appearance: Normal appearance.  ?HENT:  ?   Head: Normocephalic.  ?Eyes:  ?   Extraocular Movements: Extraocular movements intact.  ?Cardiovascular:  ?   Rate and Rhythm: Normal rate.  ?Pulmonary:  ?   Effort: Pulmonary effort is normal.  ?Abdominal:  ?   Palpations: Abdomen is soft.  ?   Tenderness: There is no abdominal tenderness.  ?Genitourinary: ?   Penis: Normal and uncircumcised.   ?   Testes: Normal.  ?Musculoskeletal:     ?   General: Normal range of motion.  ?Neurological:  ?   General: No focal deficit present.  ?   Mental Status: He is alert and oriented to person, place, and time.  ?Psychiatric:     ?   Mood and Affect: Mood normal.     ?   Behavior: Behavior normal.  ? ? ?Results for orders placed or performed in visit on 01/05/22 (from the past 24 hour(s))  ?POCT urinalysis dipstick     Status: Abnormal  ?  Collection Time: 01/05/22  4:25 PM  ?Result Value Ref Range  ? Color, UA yellow yellow  ? Clarity, UA clear clear  ? Glucose, UA negative negative mg/dL  ? Bilirubin, UA negative negative  ? Ketones, POC UA

## 2022-01-05 NOTE — Patient Instructions (Signed)

## 2022-01-05 NOTE — Telephone Encounter (Signed)
He is having symptoms of prostate enlargement.  No signs of infection.  Needs to restart taking Flomax 0.4 mg daily.  Urine culture pending.  You can relay this information to her.  Thanks.

## 2022-01-05 NOTE — Telephone Encounter (Signed)
As patient was leaving the office today. He mentioned he wanted Dr. Mitchel Honour to call and talk to his significant other Tony Price and go over what was discussed in the visit for today. Patient he doesn't use Mychart much and he will rather have a phone call. Thank you  ? ? ?

## 2022-01-06 LAB — URINE CULTURE: Result:: NO GROWTH

## 2022-01-06 NOTE — Telephone Encounter (Signed)
Called and informed patient significant other with verbal permission Dr. Mitchel Honour recommendation from 01/05/2022 OV. Verbalize understanding .  ?

## 2022-01-28 DIAGNOSIS — Z1152 Encounter for screening for COVID-19: Secondary | ICD-10-CM | POA: Diagnosis not present

## 2022-02-06 ENCOUNTER — Other Ambulatory Visit: Payer: Self-pay | Admitting: Emergency Medicine

## 2022-02-06 DIAGNOSIS — E785 Hyperlipidemia, unspecified: Secondary | ICD-10-CM

## 2022-03-08 ENCOUNTER — Ambulatory Visit (INDEPENDENT_AMBULATORY_CARE_PROVIDER_SITE_OTHER): Payer: Medicare Other | Admitting: Podiatry

## 2022-03-08 ENCOUNTER — Encounter: Payer: Self-pay | Admitting: Podiatry

## 2022-03-08 DIAGNOSIS — B351 Tinea unguium: Secondary | ICD-10-CM | POA: Diagnosis not present

## 2022-03-08 DIAGNOSIS — M79674 Pain in right toe(s): Secondary | ICD-10-CM

## 2022-03-08 DIAGNOSIS — M79675 Pain in left toe(s): Secondary | ICD-10-CM | POA: Diagnosis not present

## 2022-03-08 DIAGNOSIS — E1142 Type 2 diabetes mellitus with diabetic polyneuropathy: Secondary | ICD-10-CM

## 2022-03-13 NOTE — Progress Notes (Signed)
  Subjective:  Patient ID: Tony Price, male    DOB: [birth year],  MRN: [id number]  Tony Price presents to clinic today for preventative diabetic foot care and painful elongated mycotic toenails 1-5 bilaterally which are tender when wearing enclosed shoe gear. Pain is relieved with periodic professional debridement.  Patient states blood glucose was 121 mg/dl today.  Last known HgA1c was 6.6%.  New problem(s): None.   PCP is Horald Pollen, MD , and last visit was January 05, 2022.  No Known Allergies  Review of Systems: Negative except as noted in the HPI.  Objective: No changes noted in today's physical examination. There were no vitals filed for this visit.  Tony Price is a pleasant 81 y.o. male in NAD. AAO X 3.  Vascular Examination: CFT <3 seconds b/l LE. Palpable DP/PT pulses b/l LE. Digital hair sparse b/l. Skin temperature gradient WNL b/l. No pain with calf compression b/l. No edema noted b/l. No cyanosis or clubbing noted b/l LE.  Dermatological Examination: Pedal integument with normal turgor, texture and tone b/l LE. No open wounds b/l. No interdigital macerations b/l. Toenails 1-5 left, R hallux, R 2nd toe, R 4th toe, and R 5th toe elongated, thickened, discolored with subungual debris. +Tenderness with dorsal palpation of nailplates. No hyperkeratotic or porokeratotic lesions present. Anonychia noted R 3rd toe. Nailbed(s) epithelialized.   Neurological Examination: Protective sensation intact 5/5 intact bilaterally with 10g monofilament b/l. Vibratory sensation intact b/l. Deep tendon reflexes normal b/l.   Musculoskeletal Examination: Normal muscle strength 5/5 to all lower extremity muscle groups bilaterally. HAV with bunion deformity noted b/l LE.[name] No pain, crepitus or joint limitation noted with ROM b/l LE.  Patient ambulates independently without assistive aids.    Latest Ref Rng & Units 11/24/2021    2:03 PM 09/14/2021   11:06 AM   Hemoglobin A1C  Hemoglobin-A1c 4.6 - 6.5 % 6.6   6.4     Assessment/Plan: 1. Pain due to onychomycosis of toenails of both feet   2. Diabetic peripheral neuropathy associated with type 2 diabetes mellitus ([place])     -Patient was evaluated and treated. All patient's and/or POA's questions/concerns answered on today's visit. -Patient to continue soft, supportive shoe gear daily. -Mycotic toenails 1-5 left foot, R hallux, R 2nd toe, R 4th toe, and R 5th toe were debrided in length and girth with sterile nail nippers and dremel without iatrogenic bleeding. -Patient/POA to call should there be question/concern in the interim.   Return in about 3 months (around 06/08/2022).  Marzetta Board, DPM

## 2022-03-18 ENCOUNTER — Other Ambulatory Visit: Payer: Self-pay | Admitting: Emergency Medicine

## 2022-03-18 DIAGNOSIS — E11319 Type 2 diabetes mellitus with unspecified diabetic retinopathy without macular edema: Secondary | ICD-10-CM

## 2022-03-18 DIAGNOSIS — I10 Essential (primary) hypertension: Secondary | ICD-10-CM

## 2022-04-01 DIAGNOSIS — N401 Enlarged prostate with lower urinary tract symptoms: Secondary | ICD-10-CM | POA: Diagnosis not present

## 2022-04-01 DIAGNOSIS — R972 Elevated prostate specific antigen [PSA]: Secondary | ICD-10-CM | POA: Diagnosis not present

## 2022-04-01 DIAGNOSIS — R351 Nocturia: Secondary | ICD-10-CM | POA: Diagnosis not present

## 2022-04-01 DIAGNOSIS — R8271 Bacteriuria: Secondary | ICD-10-CM | POA: Diagnosis not present

## 2022-04-17 ENCOUNTER — Encounter: Payer: Self-pay | Admitting: Emergency Medicine

## 2022-04-26 ENCOUNTER — Ambulatory Visit: Payer: Medicare Other | Admitting: Emergency Medicine

## 2022-04-28 ENCOUNTER — Ambulatory Visit (INDEPENDENT_AMBULATORY_CARE_PROVIDER_SITE_OTHER): Payer: Medicare Other

## 2022-04-28 ENCOUNTER — Ambulatory Visit (INDEPENDENT_AMBULATORY_CARE_PROVIDER_SITE_OTHER): Payer: Medicare Other | Admitting: Emergency Medicine

## 2022-04-28 ENCOUNTER — Encounter: Payer: Self-pay | Admitting: Emergency Medicine

## 2022-04-28 VITALS — BP 110/60 | HR 81 | Temp 98.4°F | Wt 146.2 lb

## 2022-04-28 DIAGNOSIS — I152 Hypertension secondary to endocrine disorders: Secondary | ICD-10-CM

## 2022-04-28 DIAGNOSIS — E785 Hyperlipidemia, unspecified: Secondary | ICD-10-CM | POA: Diagnosis not present

## 2022-04-28 DIAGNOSIS — E1159 Type 2 diabetes mellitus with other circulatory complications: Secondary | ICD-10-CM

## 2022-04-28 DIAGNOSIS — R634 Abnormal weight loss: Secondary | ICD-10-CM

## 2022-04-28 DIAGNOSIS — R351 Nocturia: Secondary | ICD-10-CM

## 2022-04-28 DIAGNOSIS — R413 Other amnesia: Secondary | ICD-10-CM | POA: Diagnosis not present

## 2022-04-28 DIAGNOSIS — G3184 Mild cognitive impairment, so stated: Secondary | ICD-10-CM | POA: Insufficient documentation

## 2022-04-28 DIAGNOSIS — E1169 Type 2 diabetes mellitus with other specified complication: Secondary | ICD-10-CM

## 2022-04-28 HISTORY — DX: Abnormal weight loss: R63.4

## 2022-04-28 LAB — URINALYSIS, ROUTINE W REFLEX MICROSCOPIC
Bilirubin Urine: NEGATIVE
Nitrite: POSITIVE — AB
Specific Gravity, Urine: >=1.03 — AB (ref 1.000–1.030)
Total Protein, Urine: 30 — AB
Urine Glucose: NEGATIVE
Urobilinogen, UA: 0.2 (ref 0.0–1.0)
pH: 5.5 (ref 5.0–8.0)

## 2022-04-28 LAB — COMPREHENSIVE METABOLIC PANEL
ALT: 7 U/L (ref 0–53)
AST: 14 U/L (ref 0–37)
Albumin: 4.2 g/dL (ref 3.5–5.2)
Alkaline Phosphatase: 61 U/L (ref 39–117)
BUN: 21 mg/dL (ref 6–23)
CO2: 30 mEq/L (ref 19–32)
Calcium: 9.4 mg/dL (ref 8.4–10.5)
Chloride: 106 mEq/L (ref 96–112)
Creatinine, Ser: 1.46 mg/dL (ref 0.40–1.50)
GFR: 45.12 mL/min — ABNORMAL LOW (ref >60.00)
Glucose, Bld: 95 mg/dL (ref 70–99)
Potassium: 4 mEq/L (ref 3.5–5.1)
Sodium: 142 mEq/L (ref 135–145)
Total Bilirubin: 0.5 mg/dL (ref 0.2–1.2)
Total Protein: 7 g/dL (ref 6.0–8.3)

## 2022-04-28 LAB — CBC WITH DIFFERENTIAL/PLATELET
Basophils Absolute: 0 10*3/uL (ref 0.0–0.1)
Basophils Relative: 0.4 % (ref 0.0–3.0)
Eosinophils Absolute: 0 10*3/uL (ref 0.0–0.7)
Eosinophils Relative: 0.2 % (ref 0.0–5.0)
HCT: 36.9 % — ABNORMAL LOW (ref 39.0–52.0)
Hemoglobin: 11.8 g/dL — ABNORMAL LOW (ref 13.0–17.0)
Lymphocytes Relative: 19 % (ref 12.0–46.0)
Lymphs Abs: 1.4 10*3/uL (ref 0.7–4.0)
MCHC: 31.9 g/dL (ref 30.0–36.0)
MCV: 83.1 fl (ref 78.0–100.0)
Monocytes Absolute: 0.4 10*3/uL (ref 0.1–1.0)
Monocytes Relative: 4.9 % (ref 3.0–12.0)
Neutro Abs: 5.5 10*3/uL (ref 1.4–7.7)
Neutrophils Relative %: 75.5 % (ref 43.0–77.0)
Platelets: 270 10*3/uL (ref 150.0–400.0)
RBC: 4.44 Mil/uL (ref 4.22–5.81)
RDW: 15.3 % (ref 11.5–15.5)
WBC: 7.2 10*3/uL (ref 4.0–10.5)

## 2022-04-28 LAB — HEMOGLOBIN A1C: Hgb A1c MFr Bld: 6.8 % — ABNORMAL HIGH (ref 4.6–6.5)

## 2022-04-28 LAB — LIPID PANEL
Cholesterol: 153 mg/dL (ref 0–200)
HDL: 66.4 mg/dL (ref >39.00)
LDL Cholesterol: 74 mg/dL (ref 0–99)
NonHDL: 86.69
Total CHOL/HDL Ratio: 2
Triglycerides: 62 mg/dL (ref 0.0–149.0)
VLDL: 12.4 mg/dL (ref 0.0–40.0)

## 2022-04-28 LAB — MICROALBUMIN / CREATININE URINE RATIO
Creatinine,U: 411.5 mg/dL
Microalb Creat Ratio: 3 mg/g (ref 0.0–30.0)
Microalb, Ur: 12.4 mg/dL — ABNORMAL HIGH (ref 0.0–1.9)

## 2022-04-28 LAB — PSA: PSA: 19.19 ng/mL — ABNORMAL HIGH (ref 0.10–4.00)

## 2022-04-28 NOTE — Assessment & Plan Note (Signed)
Needs neurocognitive evaluation. Neurology referral placed today.

## 2022-04-28 NOTE — Assessment & Plan Note (Signed)
Well-controlled hypertension. BP Readings from Last 3 Encounters:  04/28/22 110/60  01/05/22 128/80  11/24/21 130/70  Continue amlodipine 10 mg daily and Accupril 20 mg daily. Well-controlled diabetes. Lab Results  Component Value Date   HGBA1C 6.6 (H) 11/24/2021  Continue glimepiride 2 mg daily. Cardiovascular risk associated with hypertension and diabetes discussed. Diet and nutrition discussed. Follow-up in 3 to 6 months.

## 2022-04-28 NOTE — Assessment & Plan Note (Signed)
Differential diagnosis discussed. Non-smoker.  Normal chest x-ray. Appetite seems to be good but daughter thinks he is not ingesting enough calories.  Walks regularly.  No red flag signs or symptoms. Colonoscopy in 2021 showed 2 polyps, tubular adenomas. Blood work done today.

## 2022-04-28 NOTE — Progress Notes (Signed)
Tony Price 81 y.o.   Chief Complaint  Patient presents with   Follow-up    Concern about memory    medication managment    Clarification on amlodipine dose    HISTORY OF PRESENT ILLNESS: This is a 81 y.o. male here for follow-up of hypertension diabetes and dyslipidemia. Accompanied by daughter.  Concerned about his memory. Also concerned about weight loss. Patient states he feels great.  Eating well and staying physically active. Patient has no complaints or medical concerns today. BP Readings from Last 3 Encounters:  04/28/22 110/60  01/05/22 128/80  11/24/21 130/70   Lab Results  Component Value Date   HGBA1C 6.6 (H) 11/24/2021     HPI   Prior to Admission medications   Medication Sig Start Date End Date Taking? Authorizing Provider  amLODipine (NORVASC) 10 MG tablet TAKE 1 TABLET BY MOUTH EVERY DAY 03/18/22  Yes Wilfred Dayrit, Ines Bloomer, MD  amLODipine (NORVASC) 5 MG tablet Take 1 tablet by mouth daily.   Yes [provider]  Ascorbic Acid (VITAMIN C) 500 MG CAPS See admin instructions.   Yes [provider]  aspirin 81 MG EC tablet 1 tablet   Yes [provider]  atorvastatin (LIPITOR) 20 MG tablet Take 1 tablet by mouth daily.   Yes [provider]  augmented betamethasone dipropionate (DIPROLENE-AF) 0.05 % ointment Apply topically 2 (two) times daily. 08/07/21  Yes [provider]  blood glucose meter kit and supplies KIT 1 touch ultra meter.   Check blood sugar once daily E11.9 07/14/17  Yes [name], MD  Blood Glucose Monitoring Suppl (ONE TOUCH ULTRA SYSTEM KIT) w/Device KIT Test blood sugar once daily. Dx: E11.9 07/01/16  Yes [name], MD  carboxymethylcellulose (REFRESH PLUS) 0.5 % SOLN 1 drop 2 (two) times daily as needed. Into both eyes   Yes [provider]  cephALEXin (KEFLEX) 500 MG capsule Take 1 capsule (500 mg total) by mouth 3 (three) times daily. 09/24/21  Yes Domenic Moras, PA-C   glimepiride (AMARYL) 2 MG tablet TAKE 1 TABLET BY MOUTH TWICE A DAY 03/18/22  Yes Charda Janis, Ines Bloomer, MD  glucose blood test strip Test blood sugar once daily. Dx: E11.9 07/04/17  Yes [name], MD  ipratropium (ATROVENT) 0.06 % nasal spray Place 1-2 sprays into both nostrils 3 (three) times daily. As needed for nasal congestion 09/23/20  Yes [name], MD  Lancets [facility] ULTRASOFT) lancets Test blood sugar once daily. Dx: E11.9 07/04/17  Yes [name], MD  latanoprost (XALATAN) 0.005 % ophthalmic solution 1 drop at bedtime. 08/31/19  Yes [provider]  neomycin-polymyxin b-dexamethasone (MAXITROL) 3.5-10000-0.1 OINT SMARTSIG:Topical 06/26/20  Yes [provider]  quinapril (ACCUPRIL) 20 MG tablet Take 1 tablet (20 mg total) by mouth daily. 08/14/21  Yes Dianara Smullen, Ines Bloomer, MD  tamsulosin (FLOMAX) 0.4 MG CAPS capsule Take 0.4 mg by mouth.   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/2021   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   Personal history of colonic polyps 11/04/2020   Diabetic retinopathy ([place]) 03/31/2016   Dyslipidemia associated with type 2 diabetes mellitus ([place]) 11/17/2014  Hypertension associated with diabetes ([place]) 11/17/2014    Past Medical History:  Diagnosis Date   Chest pain    Diabetes mellitus    Hypercholesterolemia    Hypertension    Kidney stones     Past Surgical History:  Procedure Laterality Date   CATARACT EXTRACTION     EYE SURGERY     PROSTATE BIOPSY     ROTATOR CUFF REPAIR     Right    Social History   Socioeconomic History   Marital status: Divorced     Spouse name: Not on file   Number of children: 3   Years of education: Not on file   Highest education level: Not on file  Occupational History   Not on file  Tobacco Use   Smoking status: Former    Years: 2.00    Types: Cigarettes   Smokeless tobacco: Never  Substance and Sexual Activity   Alcohol use: No    Alcohol/week: 0.0 standard drinks of alcohol   Drug use: No   Sexual activity: Not on file  Other Topics Concern   Not on file  Social History Narrative   Not on file   Social Determinants of Health   Financial Resource Strain: Low Risk  (07/05/2021)   Overall Financial Resource Strain (CARDIA)    Difficulty of Paying Living Expenses: Not hard at all  Food Insecurity: No Food Insecurity (07/05/2021)   Hunger Vital Sign    Worried About Running Out of Food in the Last Year: Never true    Ran Out of Food in the Last Year: Never true  Transportation Needs: No Transportation Needs (07/05/2021)   PRAPARE - Hydrologist (Medical): No    Lack of Transportation (Non-Medical): No  Physical Activity: Sufficiently Active (07/05/2021)   Exercise Vital Sign    Days of Exercise per Week: 7 days    Minutes of Exercise per Session: 60 min  Stress: No Stress Concern Present (07/05/2021)   [place]    Feeling of Stress : Not at all  Social Connections: Moderately Isolated (07/05/2021)   Social Connection and Isolation Panel [NHANES]    Frequency of Communication with Friends and Family: More than three times a week    Frequency of Social Gatherings with Friends and Family: Twice a week    Attends Religious Services: More than 4 times per year    Active Member of [employer] or Organizations: No    Attends Archivist Meetings: Never    Marital Status: Widowed  Intimate Partner Violence: Not At Risk (07/05/2021)   Humiliation, Afraid, Rape, and Kick questionnaire    Fear of Current or  Ex-Partner: No    Emotionally Abused: No    Physically Abused: No    Sexually Abused: No    Family History  Family history unknown: Yes     Review of Systems  Constitutional:  Positive for weight loss. Negative for chills and fever.  HENT: Negative.  Negative for congestion and sore throat.   Respiratory: Negative.  Negative for cough and shortness of breath.   Cardiovascular: Negative.  Negative for chest pain and palpitations.  Gastrointestinal:  Negative for abdominal pain, diarrhea, nausea and vomiting.  Genitourinary:        Nocturia  Musculoskeletal:  Negative for back pain.  Skin: Negative.  Negative for rash.  Neurological: Negative.  Negative for dizziness and headaches.  All other systems reviewed and are  negative.  Today's Vitals   04/28/22 1339  BP: 110/60  Pulse: 81  Temp: 98.4 F (36.9 C)  TempSrc: Oral  SpO2: 98%  Weight: 146 lb 4 oz (66.3 kg)   Body mass index is 22.91 kg/m.   Physical Exam Vitals reviewed.  Constitutional:      Appearance: Normal appearance.  HENT:     Head: Normocephalic.     Right Ear: Tympanic membrane, ear canal and external ear normal.     Left Ear: Tympanic membrane, ear canal and external ear normal.  Eyes:     Extraocular Movements: Extraocular movements intact.     Conjunctiva/sclera: Conjunctivae normal.     Pupils: Pupils are equal, round, and reactive to light.  Cardiovascular:     Rate and Rhythm: Normal rate and regular rhythm.     Pulses: Normal pulses.     Heart sounds: Normal heart sounds.  Pulmonary:     Effort: Pulmonary effort is normal.     Breath sounds: Normal breath sounds.  Abdominal:     General: Bowel sounds are normal. There is no distension.     Palpations: Abdomen is soft. There is no mass.     Tenderness: There is no abdominal tenderness.  Musculoskeletal:     Cervical back: No tenderness.     Right lower leg: No edema.     Left lower leg: No edema.  Lymphadenopathy:     Cervical: No  cervical adenopathy.  Skin:    General: Skin is warm and dry.     Capillary Refill: Capillary refill takes less than 2 seconds.  Neurological:     General: No focal deficit present.     Mental Status: He is alert and oriented to person, place, and time.  Psychiatric:        Mood and Affect: Mood normal.        Behavior: Behavior normal.   DG Chest 2 View  Result Date: 04/28/2022 CLINICAL DATA:  Weight loss, former smoker EXAM: CHEST - 2 VIEW COMPARISON:  08/28/2016 FINDINGS: Normal mediastinum and cardiac silhouette. Normal pulmonary vasculature. No evidence of effusion, infiltrate, or pneumothorax. No acute bony abnormality. Degenerative osteophytosis of the spine. IMPRESSION: No acute cardiopulmonary process. Electronically Signed   By: Suzy Bouchard M.D.   On: 04/28/2022 14:31     ASSESSMENT & PLAN: A total of 49 minutes was spent with the patient and counseling/coordination of care regarding preparing for this visit, review of most recent office visit notes, review of multiple chronic medical problems and their management, review of all medications, review of today's chest x-ray report, differential diagnosis for weight loss, need for blood work, education on nutrition, prognosis, documentation, and need for follow-up.  Problem List Items Addressed This Visit       Cardiovascular and Mediastinum   Hypertension associated with diabetes ([place]) - Primary    Well-controlled hypertension. BP Readings from Last 3 Encounters:  04/28/22 110/60  01/05/22 128/80  11/24/21 130/70  Continue amlodipine 10 mg daily and Accupril 20 mg daily. Well-controlled diabetes. Lab Results  Component Value Date   HGBA1C 6.6 (H) 11/24/2021  Continue glimepiride 2 mg daily. Cardiovascular risk associated with hypertension and diabetes discussed. Diet and nutrition discussed. Follow-up in 3 to 6 months.        Relevant Orders   Comprehensive metabolic panel   CBC with Differential/Platelet    Hemoglobin A1c   Urinalysis   Urine Microalbumin w/creat. ratio     Endocrine   Dyslipidemia  associated with type 2 diabetes mellitus (HCC)    Stable.  Diet and nutrition discussed. Continue atorvastatin 20 mg daily.      Relevant Orders   Lipid panel     Other   Weight loss    Differential diagnosis discussed. Non-smoker.  Normal chest x-ray. Appetite seems to be good but daughter thinks he is not ingesting enough calories.  Walks regularly.  No red flag signs or symptoms. Colonoscopy in 2021 showed 2 polyps, tubular adenomas. Blood work done today.      Relevant Orders   PSA   CBC with Differential/Platelet   DG Chest 2 View (Completed)   Urinalysis   Memory problem    Needs neurocognitive evaluation. Neurology referral placed today.      Relevant Orders   Ambulatory referral to Neurology   Other Visit Diagnoses     Nocturia       Relevant Orders   PSA      Patient Instructions  Health Maintenance After Age 31 After age 38, you are at a higher risk for certain long-term diseases and infections as well as injuries from falls. Falls are a major cause of broken bones and head injuries in people who are older than age 76. Getting regular preventive care can help to keep you healthy and well. Preventive care includes getting regular testing and making lifestyle changes as recommended by your health care provider. Talk with your health care provider about: Which screenings and tests you should have. A screening is a test that checks for a disease when you have no symptoms. A diet and exercise plan that is right for you. What should I know about screenings and tests to prevent falls? Screening and testing are the best ways to find a health problem early. Early diagnosis and treatment give you the best chance of managing medical conditions that are common after age 39. Certain conditions and lifestyle choices may make you more likely to have a fall. Your health care  provider may recommend: Regular vision checks. Poor vision and conditions such as cataracts can make you more likely to have a fall. If you wear glasses, make sure to get your prescription updated if your vision changes. Medicine review. Work with your health care provider to regularly review all of the medicines you are taking, including over-the-counter medicines. Ask your health care provider about any side effects that may make you more likely to have a fall. Tell your health care provider if any medicines that you take make you feel dizzy or sleepy. Strength and balance checks. Your health care provider may recommend certain tests to check your strength and balance while standing, walking, or changing positions. Foot health exam. Foot pain and numbness, as well as not wearing proper footwear, can make you more likely to have a fall. Screenings, including: Osteoporosis screening. Osteoporosis is a condition that causes the bones to get weaker and break more easily. Blood pressure screening. Blood pressure changes and medicines to control blood pressure can make you feel dizzy. Depression screening. You may be more likely to have a fall if you have a fear of falling, feel depressed, or feel unable to do activities that you used to do. Alcohol use screening. Using too much alcohol can affect your balance and may make you more likely to have a fall. Follow these instructions at home: Lifestyle Do not drink alcohol if: Your health care provider tells you not to drink. If you drink alcohol: Limit how much you  have to: 0-1 drink a day for women. 0-2 drinks a day for men. Know how much alcohol is in your drink. In the [place] one drink equals one 12 oz bottle of beer (355 mL), one 5 oz glass of wine (148 mL), or one 1 oz glass of hard liquor (44 mL). Do not use any products that contain nicotine or tobacco. These products include cigarettes, chewing tobacco, and vaping devices, such as e-cigarettes.  If you need help quitting, ask your health care provider. Activity  Follow a regular exercise program to stay fit. This will help you maintain your balance. Ask your health care provider what types of exercise are appropriate for you. If you need a cane or walker, use it as recommended by your health care provider. Wear supportive shoes that have nonskid soles. Safety  Remove any tripping hazards, such as rugs, cords, and clutter. Install safety equipment such as grab bars in bathrooms and safety rails on stairs. Keep rooms and walkways well-lit. General instructions Talk with your health care provider about your risks for falling. Tell your health care provider if: You fall. Be sure to tell your health care provider about all falls, even ones that seem minor. You feel dizzy, tiredness (fatigue), or off-balance. Take over-the-counter and prescription medicines only as told by your health care provider. These include supplements. Eat a healthy diet and maintain a healthy weight. A healthy diet includes low-fat dairy products, low-fat (lean) meats, and fiber from whole grains, beans, and lots of fruits and vegetables. Stay current with your vaccines. Schedule regular health, dental, and eye exams. Summary Having a healthy lifestyle and getting preventive care can help to protect your health and wellness after age 73. Screening and testing are the best way to find a health problem early and help you avoid having a fall. Early diagnosis and treatment give you the best chance for managing medical conditions that are more common for people who are older than age 57. Falls are a major cause of broken bones and head injuries in people who are older than age 64. Take precautions to prevent a fall at home. Work with your health care provider to learn what changes you can make to improve your health and wellness and to prevent falls. This information is not intended to replace advice given to you by  your health care provider. Make sure you discuss any questions you have with your health care provider. Document Revised: 02/23/2021 Document Reviewed: 02/23/2021 Elsevier Patient Education  [place], MD [facility]

## 2022-04-28 NOTE — Assessment & Plan Note (Signed)
Stable.  Diet and nutrition discussed. Continue atorvastatin 20 mg daily. 

## 2022-04-28 NOTE — Patient Instructions (Signed)
Health Maintenance After Age 81 After age 81, you are at a higher risk for certain long-term diseases and infections as well as injuries from falls. Falls are a major cause of broken bones and head injuries in people who are older than age 81. Getting regular preventive care can help to keep you healthy and well. Preventive care includes getting regular testing and making lifestyle changes as recommended by your health care provider. Talk with your health care provider about: Which screenings and tests you should have. A screening is a test that checks for a disease when you have no symptoms. A diet and exercise plan that is right for you. What should I know about screenings and tests to prevent falls? Screening and testing are the best ways to find a health problem early. Early diagnosis and treatment give you the best chance of managing medical conditions that are common after age 81. Certain conditions and lifestyle choices may make you more likely to have a fall. Your health care provider may recommend: Regular vision checks. Poor vision and conditions such as cataracts can make you more likely to have a fall. If you wear glasses, make sure to get your prescription updated if your vision changes. Medicine review. Work with your health care provider to regularly review all of the medicines you are taking, including over-the-counter medicines. Ask your health care provider about any side effects that may make you more likely to have a fall. Tell your health care provider if any medicines that you take make you feel dizzy or sleepy. Strength and balance checks. Your health care provider may recommend certain tests to check your strength and balance while standing, walking, or changing positions. Foot health exam. Foot pain and numbness, as well as not wearing proper footwear, can make you more likely to have a fall. Screenings, including: Osteoporosis screening. Osteoporosis is a condition that causes  the bones to get weaker and break more easily. Blood pressure screening. Blood pressure changes and medicines to control blood pressure can make you feel dizzy. Depression screening. You may be more likely to have a fall if you have a fear of falling, feel depressed, or feel unable to do activities that you used to do. Alcohol use screening. Using too much alcohol can affect your balance and may make you more likely to have a fall. Follow these instructions at home: Lifestyle Do not drink alcohol if: Your health care provider tells you not to drink. If you drink alcohol: Limit how much you have to: 0-1 drink a day for women. 0-2 drinks a day for men. Know how much alcohol is in your drink. In the [place] one drink equals one 12 oz bottle of beer (355 mL), one 5 oz glass of wine (148 mL), or one 1 oz glass of hard liquor (44 mL). Do not use any products that contain nicotine or tobacco. These products include cigarettes, chewing tobacco, and vaping devices, such as e-cigarettes. If you need help quitting, ask your health care provider. Activity  Follow a regular exercise program to stay fit. This will help you maintain your balance. Ask your health care provider what types of exercise are appropriate for you. If you need a cane or walker, use it as recommended by your health care provider. Wear supportive shoes that have nonskid soles. Safety  Remove any tripping hazards, such as rugs, cords, and clutter. Install safety equipment such as grab bars in bathrooms and safety rails on stairs. Keep rooms and walkways   well-lit. General instructions Talk with your health care provider about your risks for falling. Tell your health care provider if: You fall. Be sure to tell your health care provider about all falls, even ones that seem minor. You feel dizzy, tiredness (fatigue), or off-balance. Take over-the-counter and prescription medicines only as told by your health care provider. These include  supplements. Eat a healthy diet and maintain a healthy weight. A healthy diet includes low-fat dairy products, low-fat (lean) meats, and fiber from whole grains, beans, and lots of fruits and vegetables. Stay current with your vaccines. Schedule regular health, dental, and eye exams. Summary Having a healthy lifestyle and getting preventive care can help to protect your health and wellness after age 81. Screening and testing are the best way to find a health problem early and help you avoid having a fall. Early diagnosis and treatment give you the best chance for managing medical conditions that are more common for people who are older than age 81. Falls are a major cause of broken bones and head injuries in people who are older than age 81. Take precautions to prevent a fall at home. Work with your health care provider to learn what changes you can make to improve your health and wellness and to prevent falls. This information is not intended to replace advice given to you by your health care provider. Make sure you discuss any questions you have with your health care provider. Document Revised: 02/23/2021 Document Reviewed: 02/23/2021 Elsevier Patient Education  2023 Elsevier Inc.  

## 2022-04-29 ENCOUNTER — Other Ambulatory Visit: Payer: Self-pay | Admitting: Emergency Medicine

## 2022-04-29 ENCOUNTER — Other Ambulatory Visit: Payer: Medicare Other

## 2022-04-29 DIAGNOSIS — R300 Dysuria: Secondary | ICD-10-CM | POA: Diagnosis not present

## 2022-04-29 DIAGNOSIS — N138 Other obstructive and reflux uropathy: Secondary | ICD-10-CM

## 2022-04-29 MED ORDER — CIPROFLOXACIN HCL 500 MG PO TABS: 500.0000 mg | ORAL_TABLET | Freq: Two times a day (BID) | ORAL | 0 refills | Status: DC

## 2022-04-29 NOTE — Telephone Encounter (Signed)
Pt daughter Carley Strickling called in requesting pt lab results. Advised Denita "Baseline labs.  However urinalysis shows possible infection.  Needs urine culture.  Have patient bring urine sample to lab please.  Will cover with antibiotics in the meantime.  Prescription for Cipro sent to pharmacy of record."   [facility]

## 2022-05-01 LAB — URINE CULTURE
MICRO NUMBER:: 13643081
SPECIMEN QUALITY:: ADEQUATE

## 2022-05-03 ENCOUNTER — Other Ambulatory Visit: Payer: Self-pay | Admitting: Emergency Medicine

## 2022-05-03 DIAGNOSIS — B9620 Unspecified Escherichia coli [E. coli] as the cause of diseases classified elsewhere: Secondary | ICD-10-CM

## 2022-05-03 MED ORDER — CEFUROXIME AXETIL 500 MG PO TABS: 500.0000 mg | ORAL_TABLET | Freq: Two times a day (BID) | ORAL | 0 refills | Status: AC

## 2022-05-06 ENCOUNTER — Telehealth: Payer: Self-pay | Admitting: Emergency Medicine

## 2022-05-06 NOTE — Telephone Encounter (Signed)
Pt daughter Waldon Merl called to get an update on the neurology referral for her father Tony Price. Advised Denita that [facility] needed notes that talk about the memory loss that he is having. I printed out office notes and advised pt I would faxing the notes over. Progress notes from 04/28/22 were faxed 05/06/22 12:48PM.    Juluis Rainier

## 2022-05-12 NOTE — Telephone Encounter (Signed)
[facility] and talked to the referral coordinator about patient referral. Notes was sent and patient will be called to schedule an appt. Informed patient's daughter

## 2022-05-12 NOTE — Telephone Encounter (Signed)
Pt daughter called in to follow up on this.   Please contact pt at earliest convenience.   [facility] needs notes and pt/s daughter is becoming frustrated.

## 2022-05-17 ENCOUNTER — Ambulatory Visit (INDEPENDENT_AMBULATORY_CARE_PROVIDER_SITE_OTHER): Payer: Medicare Other | Admitting: Physician Assistant

## 2022-05-17 ENCOUNTER — Other Ambulatory Visit: Payer: Medicare Other

## 2022-05-17 ENCOUNTER — Encounter: Payer: Self-pay | Admitting: Physician Assistant

## 2022-05-17 VITALS — BP 121/70 | HR 80 | Resp 18 | Ht 66.0 in | Wt 146.0 lb

## 2022-05-17 DIAGNOSIS — F03A4 Unspecified dementia, mild, with anxiety: Secondary | ICD-10-CM

## 2022-05-17 DIAGNOSIS — R413 Other amnesia: Secondary | ICD-10-CM

## 2022-05-17 LAB — VITAMIN B12: Vitamin B-12: 248 pg/mL (ref 211–911)

## 2022-05-17 LAB — TSH: TSH: 1.71 u[IU]/mL (ref 0.35–5.50)

## 2022-05-17 MED ORDER — MEMANTINE HCL 5 MG PO TABS: ORAL_TABLET | ORAL | 11 refills | Status: DC

## 2022-05-17 NOTE — Progress Notes (Cosign Needed)
Assessment/Plan:    The patient is seen in neurologic consultation at the request of Horald Pollen, * for the evaluation of memory.  Tony Price is a very pleasant 81 y.o. year old RH male with  a history of hypertension, hyperlipidemia, DM2, diabetic retinopathy, recent E. coli UTI, seen today for evaluation of memory loss.  MoCA blind is inconclusive, patient rushed to answer the questions, suspect that there is a component of inattention.  Delayed recall however is 0/5.  Other work-up is in progress.    Recommendations:   Dementia, of uncertain etiology MRI brain without contrast to assess for underlying structural abnormality and assess vascular load  Neurocognitive testing to further evaluate cognitive concerns and determine other underlying cause of memory changes, including potential contribution from sleep, anxiety, or depression  Check B12, TSH Start memantine 5 mg, take 1 p.o. nightly for 2 weeks, then increase to 5 mg twice daily if tolerated.  Side effects were discussed. Folllow up in 3 months  Subjective:    The patient is accompanied by wife who supplements the history.    How long did patient have memory difficulties?  About 2 years, short-term memory is worse over the last 2 months.  "If I telling him going to do something, he will ask me again " Patient lives with: His daughter and granddaughter, according to the wife, they bring significant amount of stress, which wife feels that exacerbate his memory difficulties.   Repeats oneself?  Endorsed, he asked the same questions and tells the same stories. Disoriented when walking into a room?  Patient denies   Leaving objects in unusual places?  Patient denies   Ambulates  with difficulty?   Patient denies   Recent falls?  Patient denies   Any head injuries?  Patient denies   History of seizures?   Patient denies   Wandering behavior?  Patient denies   Patient drives?  No issues, may need GPS even when it  was a familiar location  Any mood changes? More frustrated than before  Any history of depression?:  Patient denies   Hallucinations?  Patient denies   Paranoia?  Patient denies   Patient reports that he sleeps well without vivid dreams, REM behavior or sleepwalking   History of sleep apnea?  Patient denies   Any hygiene concerns?  Patient denies   Independent of bathing and dressing?  Endorsed  Does the patient needs help with medications?  Patient is in charge Who is in charge of the finances?  Patient is in charge    Patient have trouble swallowing? Patient denies   Does the patient cook?  Patient denies   Any kitchen accidents such as leaving the stove on? Patient denies   Any headaches?  Patient denies   Double vision? Patient denies   Any focal numbness or tingling?  Patient denies   Chronic back pain Patient denies   Unilateral weakness?  Patient denies   Any tremors?  Patient denies   Any history of anosmia?  Patient denies   Any incontinence of urine?  Patient denies   Any bowel dysfunction?   Patient denies      History of heavy alcohol intake?  Patient denies   History of heavy tobacco use?  Patient denies   Family history of dementia?  Mother  and a first cousin had Alzheimer's disease    Supervisor in food service.   No Known Allergies  Current Outpatient Medications  Medication Instructions  amLODipine (NORVASC) 10 MG tablet TAKE 1 TABLET BY MOUTH EVERY DAY   Ascorbic Acid (VITAMIN C) 500 MG CAPS See admin instructions   aspirin 81 MG EC tablet 1 tablet   atorvastatin (LIPITOR) 20 MG tablet 1 tablet, Oral, Daily   augmented betamethasone dipropionate (DIPROLENE-AF) 0.05 % ointment Topical, 2 times daily   blood glucose meter kit and supplies KIT 1 touch ultra meter.   Check blood sugar once daily<BR>E11.9   Blood Glucose Monitoring Suppl (ONE TOUCH ULTRA SYSTEM KIT) w/Device KIT Test blood sugar once daily. Dx: E11.9   carboxymethylcellulose (REFRESH PLUS) 0.5 %  SOLN 1 drop 2 (two) times daily as needed. Into both eyes   glimepiride (AMARYL) 2 MG tablet TAKE 1 TABLET BY MOUTH TWICE A DAY   glucose blood test strip Test blood sugar once daily. Dx: E11.9   ipratropium (ATROVENT) 0.06 % nasal spray 1-2 sprays, Each Nare, 3 times daily, As needed for nasal congestion   Lancets (ONETOUCH ULTRASOFT) lancets Test blood sugar once daily. Dx: E11.9   latanoprost (XALATAN) 0.005 % ophthalmic solution 1 drop, Daily at bedtime   neomycin-polymyxin b-dexamethasone (MAXITROL) 3.5-10000-0.1 OINT SMARTSIG:Topical   quinapril (ACCUPRIL) 20 mg, Oral, Daily   tamsulosin (FLOMAX) 0.4 mg, Oral   triamcinolone cream (KENALOG) 0.1 % APPLY TO AFFECTED AREA TWICE A DAY     VITALS:  There were no vitals filed for this visit.    04/28/2022    1:43 PM 07/27/2021    9:56 AM 07/05/2021    1:05 PM 03/09/2021    3:06 PM 07/09/2020    2:15 PM  Depression screen PHQ 2/9  Decreased Interest 0 0 0 0 0  Down, Depressed, Hopeless 0 0 0 0 0  PHQ - 2 Score 0 0 0 0 0    PHYSICAL EXAM   HEENT:  Normocephalic, atraumatic. The mucous membranes are moist. The superficial temporal arteries are without ropiness or tenderness.  Looks much younger than his stated age Cardiovascular: Regular rate and rhythm. Lungs: Clear to auscultation bilaterally. Neck: There are no carotid bruits noted bilaterally.  NEUROLOGICAL:     No data to display             06/17/2016    1:33 PM  MMSE - Mini Mental State Exam  Orientation to time 5  Orientation to Place 5  Registration 3  Attention/ Calculation 5  Recall 2  Language- name 2 objects 2  Language- repeat 1  Language- follow 3 step command 3  Language- read & follow direction 1  Write a sentence 1  Copy design 1  Total score 29     Orientation:  Alert and oriented to person, place and time. No aphasia or dysarthria. Fund of knowledge is appropriate.  Recent and remote memory impaired..  Attention and concentration are reduced l.   Able to name objects and repeat phrases. Delayed recall 0/5 Cranial nerves: There is good facial symmetry. Extraocular muscles are intact and visual fields are full to confrontational testing. Speech is fluent and clear. Soft palate rises symmetrically and there is no tongue deviation. Hearing is intact to conversational tone. Tone: Tone is good throughout. Sensation: Sensation is intact to light touch and pinprick throughout. Vibration is intact at the bilateral big toe.There is no extinction with double simultaneous stimulation. There is no sensory dermatomal level identified. Coordination: The patient has no difficulty with RAM's or FNF bilaterally. Normal finger to nose  Motor: Strength is 5/5 in the bilateral upper and  lower extremities. There is no pronator drift. There are no fasciculations noted. DTR's: Deep tendon reflexes are 2/4 at the bilateral biceps, triceps, brachioradialis, patella and achilles.  Plantar responses are downgoing bilaterally. Gait and Station: The patient is able to ambulate without difficulty.The patient is able to heel toe walk without any difficulty.The patient is able to ambulate in a tandem fashion. The patient is able to stand in the Romberg position.     Thank you for allowing [place] the opportunity to participate in the care of this nice patient. Please do not hesitate to contact us for any questions or concerns.   Total time spent on today's visit was 61 minutes dedicated to this patient today, preparing to see patient, examining the patient, ordering tests and/or medications and counseling the patient, documenting clinical information in the EHR or other health record, independently interpreting results and communicating results to the patient/family, discussing treatment and goals, answering patient's questions and coordinating care.  Cc:  Horald Pollen, MD  Sharene Butters 05/17/2022 11:11 AM

## 2022-05-17 NOTE — Patient Instructions (Addendum)
It was a pleasure to see you today at our office.   Recommendations:  Neurocognitive evaluation at our office MRI of the brain, the radiology office will call you to arrange you appointment Check labs today Start Memantine '5mg'$  tablets.  Take 1 tablet at bedtime for 2 weeks, then 1 tablet twice daily.   Side effects include dizziness, headache, diarrhea or constipation.  Call if severe symptoms appear  Follow up in 3 month   Whom to call:  Memory  decline, memory medications: Call our office [phone]   For psychiatric meds, mood meds: Please have your primary care physician manage these medications.   Counseling regarding caregiver distress, including caregiver depression, anxiety and issues regarding community resources, adult day care programs, adult living facilities, or memory care questions:   Feel free to contact [facility], Social Worker at [phone]   For assessment of decision of mental capacity and competency:  Call Dr. Anthoney Harada, geriatric psychiatrist at [phone]  For guidance in geriatric dementia issues please call [facility] [phone]  For guidance regarding [facility] and if placement were needed at the facility, contact Arnell Asal, Social Worker tel: [phone]  If you have any severe symptoms of a stroke, or other severe issues such as confusion,severe chills or fever, etc call 911 or go to the ER as you may need to be evaluated further   Feel free to visit Facebook page " [employer]" for tips of how to care for people with memory problems.   Feel free to go to the following database for funded clinical studies conducted around the world: http://saunders.com/   https://www.triadclinicaltrials.com/     RECOMMENDATIONS FOR ALL PATIENTS WITH MEMORY PROBLEMS: 1. Continue to exercise (Recommend 30 minutes of walking everyday, or 3 hours every week) 2. Increase social interactions - continue going  to [place] and enjoy social gatherings with friends and family 3. Eat healthy, avoid fried foods and eat more fruits and vegetables 4. Maintain adequate blood pressure, blood sugar, and blood cholesterol level. Reducing the risk of stroke and cardiovascular disease also helps promoting better memory. 5. Avoid stressful situations. Live a simple life and avoid aggravations. Organize your time and prepare for the next day in anticipation. 6. Sleep well, avoid any interruptions of sleep and avoid any distractions in the bedroom that may interfere with adequate sleep quality 7. Avoid sugar, avoid sweets as there is a strong link between excessive sugar intake, diabetes, and cognitive impairment We discussed the Mediterranean diet, which has been shown to help patients reduce the risk of progressive memory disorders and reduces cardiovascular risk. This includes eating fish, eat fruits and green leafy vegetables, nuts like almonds and hazelnuts, walnuts, and also use olive oil. Avoid fast foods and fried foods as much as possible. Avoid sweets and sugar as sugar use has been linked to worsening of memory function.  There is always a concern of gradual progression of memory problems. If this is the case, then we may need to adjust level of care according to patient needs. Support, both to the patient and caregiver, should then be put into place.      You have been referred for a neuropsychological evaluation (i.e., evaluation of memory and thinking abilities). Please bring someone with you to this appointment if possible, as it is helpful for the doctor to hear from both you and another adult who knows you well. Please bring eyeglasses and hearing aids if you wear them.  The evaluation will take approximately 3 hours and has two parts:   The first part is a clinical interview with the neuropsychologist (Dr. Melvyn Novas or Dr. Nicole Kindred). During the interview, the neuropsychologist will speak with you and the  individual you brought to the appointment.    The second part of the evaluation is testing with the doctor's technician Hinton Dyer or Maudie Mercury). During the testing, the technician will ask you to remember different types of material, solve problems, and answer some questionnaires. Your family member will not be present for this portion of the evaluation.   Please note: We must reserve several hours of the neuropsychologist's time and the psychometrician's time for your evaluation appointment. As such, there is a No-Show fee of $100. If you are unable to attend any of your appointments, please contact our office as soon as possible to reschedule.    FALL PRECAUTIONS: Be cautious when walking. Scan the area for obstacles that may increase the risk of trips and falls. When getting up in the mornings, sit up at the edge of the bed for a few minutes before getting out of bed. Consider elevating the bed at the head end to avoid drop of blood pressure when getting up. Walk always in a well-lit room (use night lights in the walls). Avoid area rugs or power cords from appliances in the middle of the walkways. Use a walker or a cane if necessary and consider physical therapy for balance exercise. Get your eyesight checked regularly.  FINANCIAL OVERSIGHT: Supervision, especially oversight when making financial decisions or transactions is also recommended.  HOME SAFETY: Consider the safety of the kitchen when operating appliances like stoves, microwave oven, and blender. Consider having supervision and share cooking responsibilities until no longer able to participate in those. Accidents with firearms and other hazards in the house should be identified and addressed as well.   ABILITY TO BE LEFT ALONE: If patient is unable to contact 911 operator, consider using LifeLine, or when the need is there, arrange for someone to stay with patients. Smoking is a fire hazard, consider supervision or cessation. Risk of wandering  should be assessed by caregiver and if detected at any point, supervision and safe proof recommendations should be instituted.  MEDICATION SUPERVISION: Inability to self-administer medication needs to be constantly addressed. Implement a mechanism to ensure safe administration of the medications.   DRIVING: Regarding driving, in patients with progressive memory problems, driving will be impaired. We advise to have someone else do the driving if trouble finding directions or if minor accidents are reported. Independent driving assessment is available to determine safety of driving.   If you are interested in the driving assessment, you can contact the following:  [employer] in [place]  [place] [phone] or [phone]    [place] refers to food and lifestyle choices that are based on the traditions of countries located on the [employer]. This way of eating has been shown to help prevent certain conditions and improve outcomes for people who have chronic diseases, like kidney disease and heart disease. What are tips for following this plan? Lifestyle  Cook and eat meals together with your family, when possible. Drink enough fluid to keep your urine clear or pale yellow. Be physically active every day. This includes: Aerobic exercise like running or swimming. Leisure activities like gardening, walking, or housework. Get 7-8 hours of sleep each night. If  recommended by your health care provider, drink red wine in moderation. This means 1 glass a day for nonpregnant women and 2 glasses a day for men. A glass of wine equals 5 oz (150 mL). Reading food labels  Check the serving size of packaged foods. For foods such as rice and pasta, the serving size refers to the amount of cooked product, not dry. Check the total fat in packaged  foods. Avoid foods that have saturated fat or trans fats. Check the ingredients list for added sugars, such as corn syrup. Shopping  At the grocery store, buy most of your food from the areas near the walls of the store. This includes: Fresh fruits and vegetables (produce). Grains, beans, nuts, and seeds. Some of these may be available in unpackaged forms or large amounts (in bulk). Fresh seafood. Poultry and eggs. Low-fat dairy products. Buy whole ingredients instead of prepackaged foods. Buy fresh fruits and vegetables in-season from local farmers markets. Buy frozen fruits and vegetables in resealable bags. If you do not have access to quality fresh seafood, buy precooked frozen shrimp or canned fish, such as tuna, salmon, or sardines. Buy small amounts of raw or cooked vegetables, salads, or olives from the deli or salad bar at your store. Stock your pantry so you always have certain foods on hand, such as olive oil, canned tuna, canned tomatoes, rice, pasta, and beans. Cooking  Cook foods with extra-virgin olive oil instead of using butter or other vegetable oils. Have meat as a side dish, and have vegetables or grains as your main dish. This means having meat in small portions or adding small amounts of meat to foods like pasta or stew. Use beans or vegetables instead of meat in common dishes like chili or lasagna. Experiment with different cooking methods. Try roasting or broiling vegetables instead of steaming or sauteing them. Add frozen vegetables to soups, stews, pasta, or rice. Add nuts or seeds for added healthy fat at each meal. You can add these to yogurt, salads, or vegetable dishes. Marinate fish or vegetables using olive oil, lemon juice, garlic, and fresh herbs. Meal planning  Plan to eat 1 vegetarian meal one day each week. Try to work up to 2 vegetarian meals, if possible. Eat seafood 2 or more times a week. Have healthy snacks readily available, such as: Vegetable  sticks with hummus. Greek yogurt. Fruit and nut trail mix. Eat balanced meals throughout the week. This includes: Fruit: 2-3 servings a day Vegetables: 4-5 servings a day Low-fat dairy: 2 servings a day Fish, poultry, or lean meat: 1 serving a day Beans and legumes: 2 or more servings a week Nuts and seeds: 1-2 servings a day Whole grains: 6-8 servings a day Extra-virgin olive oil: 3-4 servings a day Limit red meat and sweets to only a few servings a month What are my food choices? Mediterranean diet Recommended Grains: Whole-grain pasta. Brown rice. Bulgar wheat. Polenta. Couscous. Whole-wheat bread. [place]. Vegetables: Artichokes. Beets. Broccoli. Cabbage. Carrots. Eggplant. Green beans. Chard. Kale. Spinach. Onions. Leeks. Peas. Squash. Tomatoes. Peppers. Radishes. Fruits: Apples. Apricots. Avocado. Berries. Bananas. Cherries. Dates. Figs. Grapes. Lemons. Melon. Oranges. Peaches. Plums. Pomegranate. Meats and other protein foods: Beans. Almonds. Sunflower seeds. Pine nuts. Peanuts. [place]. Salmon. Scallops. Shrimp. [place]. Tilapia. Clams. Oysters. Eggs. Dairy: Low-fat milk. Cheese. Greek yogurt. Beverages: Water. Red wine. Herbal tea. Fats and oils: Extra virgin olive oil. Avocado oil. Grape seed oil. Sweets and desserts: [place] yogurt with honey. Baked apples. Poached pears. Trail mix.  Seasoning and other foods: Basil. Cilantro. Coriander. Cumin. Mint. Parsley. Sage. Rosemary. Tarragon. Garlic. Oregano. Thyme. Pepper. Balsalmic vinegar. Tahini. Hummus. Tomato sauce. Olives. Mushrooms. Limit these Grains: Prepackaged pasta or rice dishes. Prepackaged cereal with added sugar. Vegetables: Deep fried potatoes (french fries). Fruits: Fruit canned in syrup. Meats and other protein foods: Beef. Pork. Lamb. Poultry with skin. Hot dogs. Berniece Salines. Dairy: Ice cream. Sour cream. Whole milk. Beverages: Juice. Sugar-sweetened soft drinks. Beer. Liquor and spirits. Fats and oils: Butter. Canola  oil. Vegetable oil. Beef fat (tallow). Lard. Sweets and desserts: Cookies. Cakes. Pies. Candy. Seasoning and other foods: Mayonnaise. Premade sauces and marinades. The items listed may not be a complete list. Talk with your dietitian about what dietary choices are right for you. Summary The Mediterranean diet includes both food and lifestyle choices. Eat a variety of fresh fruits and vegetables, beans, nuts, seeds, and whole grains. Limit the amount of red meat and sweets that you eat. Talk with your health care provider about whether it is safe for you to drink red wine in moderation. This means 1 glass a day for nonpregnant women and 2 glasses a day for men. A glass of wine equals 5 oz (150 mL). This information is not intended to replace advice given to you by your health care provider. Make sure you discuss any questions you have with your health care provider. Document Released: 05/27/2016 Document Revised: 06/29/2016 Document Reviewed: 05/27/2016 Elsevier Interactive Patient Education  2017 [employer].   We have sent a referral to [facility] for your MRI and they will call you directly to schedule your appointment. They are located at [place]. If you need to contact them directly please call [phone].  Your provider has requested that you have labwork completed today. Please go to [facility] ([id number]) on the second floor of this building before leaving the office today. You do not need to check in. If you are not called within 15 minutes please check with the front desk.

## 2022-05-18 ENCOUNTER — Encounter: Payer: Self-pay | Admitting: Emergency Medicine

## 2022-05-18 DIAGNOSIS — L989 Disorder of the skin and subcutaneous tissue, unspecified: Secondary | ICD-10-CM

## 2022-05-18 NOTE — Progress Notes (Signed)
Recommend B12 supplements, 1000 mcg daily, follow with PCP. His B12 is 248, in the lower normal, thyroid levels are normal, thanks

## 2022-05-19 NOTE — Telephone Encounter (Signed)
Please place another referral.  Thanks.

## 2022-05-24 ENCOUNTER — Encounter: Payer: Self-pay | Admitting: Psychology

## 2022-05-24 DIAGNOSIS — R972 Elevated prostate specific antigen [PSA]: Secondary | ICD-10-CM | POA: Diagnosis not present

## 2022-05-27 DIAGNOSIS — E113293 Type 2 diabetes mellitus with mild nonproliferative diabetic retinopathy without macular edema, bilateral: Secondary | ICD-10-CM | POA: Diagnosis not present

## 2022-05-27 DIAGNOSIS — H402231 Chronic angle-closure glaucoma, bilateral, mild stage: Secondary | ICD-10-CM | POA: Diagnosis not present

## 2022-05-27 DIAGNOSIS — H35033 Hypertensive retinopathy, bilateral: Secondary | ICD-10-CM | POA: Diagnosis not present

## 2022-05-27 DIAGNOSIS — H35371 Puckering of macula, right eye: Secondary | ICD-10-CM | POA: Diagnosis not present

## 2022-05-28 ENCOUNTER — Other Ambulatory Visit: Payer: Medicare Other

## 2022-05-30 ENCOUNTER — Ambulatory Visit
Admission: RE | Admit: 2022-05-30 | Discharge: 2022-05-30 | Disposition: A | Discharge status: Home / Self Care | Payer: Medicare Other | Source: Ambulatory Visit | Attending: Physician Assistant | Admitting: Physician Assistant

## 2022-05-30 DIAGNOSIS — I6782 Cerebral ischemia: Secondary | ICD-10-CM | POA: Diagnosis not present

## 2022-05-30 DIAGNOSIS — R413 Other amnesia: Secondary | ICD-10-CM | POA: Diagnosis not present

## 2022-05-31 DIAGNOSIS — N401 Enlarged prostate with lower urinary tract symptoms: Secondary | ICD-10-CM | POA: Diagnosis not present

## 2022-05-31 DIAGNOSIS — N5201 Erectile dysfunction due to arterial insufficiency: Secondary | ICD-10-CM | POA: Diagnosis not present

## 2022-05-31 DIAGNOSIS — R351 Nocturia: Secondary | ICD-10-CM | POA: Diagnosis not present

## 2022-05-31 DIAGNOSIS — R972 Elevated prostate specific antigen [PSA]: Secondary | ICD-10-CM | POA: Diagnosis not present

## 2022-06-02 NOTE — Progress Notes (Signed)
No acute MRI changes, mild age-related volume loss, there is chronic small vessel changes, make sure that you monitor your blood pressure, cholesterol and sugars. Thank you

## 2022-06-03 ENCOUNTER — Telehealth: Payer: Self-pay | Admitting: Physician Assistant

## 2022-06-03 NOTE — Telephone Encounter (Signed)
Patients daughter said her father received a call from someone regarding results. If someone could call her for the results. Not her dad [phone]

## 2022-06-03 NOTE — Telephone Encounter (Signed)
Denita advised of MRI, ( is on his DPR). Thanked me for calling and appreciated me for calling back.

## 2022-06-09 ENCOUNTER — Other Ambulatory Visit: Payer: Self-pay | Admitting: Physician Assistant

## 2022-06-11 ENCOUNTER — Encounter: Payer: Self-pay | Admitting: Podiatry

## 2022-06-11 ENCOUNTER — Ambulatory Visit (INDEPENDENT_AMBULATORY_CARE_PROVIDER_SITE_OTHER): Payer: Medicare Other | Admitting: Podiatry

## 2022-06-11 DIAGNOSIS — M79674 Pain in right toe(s): Secondary | ICD-10-CM

## 2022-06-11 DIAGNOSIS — B351 Tinea unguium: Secondary | ICD-10-CM | POA: Diagnosis not present

## 2022-06-11 DIAGNOSIS — E1142 Type 2 diabetes mellitus with diabetic polyneuropathy: Secondary | ICD-10-CM | POA: Diagnosis not present

## 2022-06-11 DIAGNOSIS — M79675 Pain in left toe(s): Secondary | ICD-10-CM | POA: Diagnosis not present

## 2022-06-17 NOTE — Progress Notes (Signed)
  Subjective:  Patient ID: Tony Price, male    DOB: May 09, 1941,  MRN: [id number]  81 y.o. male presents with at risk foot care with history of diabetic neuropathy and painful elongated mycotic toenails 1-5 bilaterally which are tender when wearing enclosed shoe gear. Pain is relieved with periodic professional debridement..    Patient states blood glucose was 115 mg/dl today. Last known HgA1c was unknown.  New problem(s): None    PCP: Horald Pollen, MD and last visit was: April 28, 2022.  Review of Systems: Negative except as noted in the HPI.   No Known Allergies  Objective:  There were no vitals filed for this visit. Constitutional Patient is a pleasant 81 y.o. African American male WD, WN in NAD. AAO x 3.  Vascular Capillary fill time to digits immediate b/l.  DP/PT pulse(s) are palpable b/l lower extremities. Pedal hair sparse. Lower extremity skin temperature gradient within normal limits. No pain with calf compression b/l. No edema noted b/l lower extremities. No cyanosis or clubbing noted.   Neurologic Protective sensation intact 5/5 intact bilaterally with 10g monofilament b/l. Vibratory sensation intact b/l. No clonus b/l. Pt has subjective symptoms of neuropathy.  Dermatologic Pedal skin is warm and supple b/l.  No open wounds b/l lower extremities. No interdigital macerations b/l lower extremities. Toenails 1-5 b/l elongated, discolored, dystrophic, thickened, crumbly with subungual debris and tenderness to dorsal palpation. No hyperkeratotic nor porokeratotic lesions present on today's visit.  Orthopedic: Normal muscle strength 5/5 to all lower extremity muscle groups bilaterally. Patient ambulates independent of any assistive aids. HAV with bunion deformity noted b/l LE.      Latest Ref Rng & Units 04/28/2022    2:14 PM 11/24/2021    2:03 PM 09/14/2021   11:06 AM  Hemoglobin A1C  Hemoglobin-A1c 4.6 - 6.5 % 6.8  6.6  6.4    Assessment:   1. Pain due to  onychomycosis of toenails of both feet   2. Diabetic peripheral neuropathy associated with type 2 diabetes mellitus ([place])    Plan:  Patient was evaluated and treated and all questions answered. Consent given for treatment as described below: -Examined patient. -Toenails 1-5 b/l were debrided in length and girth with sterile nail nippers and dremel without iatrogenic bleeding.  -Patient/POA to call should there be question/concern in the interim.  Return in about 3 months (around 09/11/2022).  Marzetta Board, DPM

## 2022-06-18 ENCOUNTER — Telehealth: Payer: Self-pay

## 2022-06-18 NOTE — Telephone Encounter (Signed)
Pt daughter is calling stating that the quinapril (ACCUPRIL) 20 MG tablet has been discontinued and asking for an alternate BP medication.  Please advise

## 2022-06-21 ENCOUNTER — Other Ambulatory Visit: Payer: Self-pay | Admitting: Emergency Medicine

## 2022-06-21 DIAGNOSIS — I152 Hypertension secondary to endocrine disorders: Secondary | ICD-10-CM

## 2022-06-21 MED ORDER — VALSARTAN 160 MG PO TABS: 160.0000 mg | ORAL_TABLET | Freq: Every day | ORAL | 3 refills | Status: DC

## 2022-06-21 NOTE — Telephone Encounter (Signed)
Stop Accupril.  New prescription for valsartan 160 mg daily sent to pharmacy of record.  Thanks.

## 2022-06-22 NOTE — Telephone Encounter (Signed)
Called and informed patient and patient daughter that a new BP prescription was sent to the pharmacy on file

## 2022-07-07 ENCOUNTER — Ambulatory Visit (INDEPENDENT_AMBULATORY_CARE_PROVIDER_SITE_OTHER): Payer: Medicare Other

## 2022-07-07 VITALS — Ht 66.0 in | Wt 150.0 lb

## 2022-07-07 DIAGNOSIS — Z0000 Encounter for general adult medical examination without abnormal findings: Secondary | ICD-10-CM | POA: Diagnosis not present

## 2022-07-07 NOTE — Progress Notes (Signed)
Virtual Visit via Telephone Note  I connected with  Tony Price on 07/07/22 at 11:30 AM EDT by telephone and verified that I am speaking with the correct person using two identifiers.  Location: Patient: Home Provider: [place] Persons participating in the virtual visit: [place]   I discussed the limitations, risks, security and privacy concerns of performing an evaluation and management service by telephone and the availability of in person appointments. The patient expressed understanding and agreed to proceed.  Interactive audio and video telecommunications were attempted between this nurse and patient, however failed, due to patient having technical difficulties OR patient did not have access to video capability.  We continued and completed visit with audio only.  Some vital signs may be absent or patient reported.   Sheral Flow, LPN  Subjective:   Tony Price is a 81 y.o. male who presents for Medicare Annual/Subsequent preventive examination.  Review of Systems     Cardiac Risk Factors include: advanced age (>26mn, >>49women);diabetes mellitus;hypertension;male gender;dyslipidemia     Objective:    Today's Vitals   07/07/22 1132  Weight: 150 lb (68 kg)  Height: '5\' 6"'  (1.676 m)  PainSc: 0-No pain   Body mass index is 24.21 kg/m.     07/07/2022   11:33 AM 05/17/2022    1:38 PM 09/24/2021    9:30 AM 07/05/2021    1:09 PM 06/14/2019    1:04 PM 07/08/2017    3:00 PM 11/17/2014    2:02 PM  Advanced Directives  Does Patient Have a Medical Advance Directive? Yes Yes No Yes No No No  Type of AParamed[place]of [place]   Living will     Does patient want to make changes to medical advance directive?    No - Patient declined     Copy of HVinegar [place] Chart? No - copy requested   No - copy requested     Would patient like information on creating a medical advance directive?      No - Patient declined Yes (MAU/Ambulatory/Procedural Areas - Information given) No - patient declined information    Current Medications (verified) Outpatient Encounter Medications as of 07/07/2022  Medication Sig   amLODipine (NORVASC) 10 MG tablet TAKE 1 TABLET BY MOUTH EVERY DAY   Ascorbic Acid (VITAMIN C) 500 MG CAPS See admin instructions.   aspirin 81 MG EC tablet 1 tablet   atorvastatin (LIPITOR) 20 MG tablet Take 1 tablet by mouth daily.   augmented betamethasone dipropionate (DIPROLENE-AF) 0.05 % ointment Apply topically 2 (two) times daily.   blood glucose meter kit and supplies KIT 1 touch ultra meter.   Check blood sugar once daily E11.9   Blood Glucose Monitoring Suppl (ONE TOUCH ULTRA SYSTEM KIT) w/Device KIT Test blood sugar once daily. Dx: E11.9   carboxymethylcellulose (REFRESH PLUS) 0.5 % SOLN 1 drop 2 (two) times daily as needed. Into both eyes   glimepiride (AMARYL) 2 MG tablet TAKE 1 TABLET BY MOUTH TWICE A DAY   glucose blood test strip Test blood sugar once daily. Dx: E11.9   ipratropium (ATROVENT) 0.06 % nasal spray Place 1-2 sprays into both nostrils 3 (three) times daily. As needed for nasal congestion   Lancets (ONETOUCH ULTRASOFT) lancets Test blood sugar once daily. Dx: E11.9   latanoprost (XALATAN) 0.005 % ophthalmic solution 1 drop at bedtime.   memantine (NAMENDA) 5 MG tablet TAKE 1 TABLET (5 MG AT NIGHT) FOR 2  WEEKS, THEN INCREASE TO 1 TABLET (5 MG) TWICE A DAY   neomycin-polymyxin b-dexamethasone (MAXITROL) 3.5-10000-0.1 OINT SMARTSIG:Topical   tamsulosin (FLOMAX) 0.4 MG CAPS capsule Take 0.4 mg by mouth.   triamcinolone cream (KENALOG) 0.1 % APPLY TO AFFECTED AREA TWICE A DAY   valsartan (DIOVAN) 160 MG tablet Take 1 tablet (160 mg total) by mouth daily.   No facility-administered encounter medications on file as of 07/07/2022.    Allergies (verified) Patient has no known allergies.   History: Past Medical History:  Diagnosis Date   Chest pain     Diabetes mellitus    Hypercholesterolemia    Hypertension    Kidney stones    Past Surgical History:  Procedure Laterality Date   CATARACT EXTRACTION     EYE SURGERY     PROSTATE BIOPSY     ROTATOR CUFF REPAIR     Right   Family History  Family history unknown: Yes   Social History   Socioeconomic History   Marital status: Divorced    Spouse name: Not on file   Number of children: 3   Years of education: 12   Highest education level: Not on file  Occupational History   Not on file  Tobacco Use   Smoking status: Former    Years: 2.00    Types: Cigarettes   Smokeless tobacco: Never  Substance and Sexual Activity   Alcohol use: No    Alcohol/week: 0.0 standard drinks of alcohol   Drug use: No   Sexual activity: Not on file  Other Topics Concern   Not on file  Social History Narrative   Right handed   No caffeine   No falls   One story home   Social Determinants of Health   Financial Resource Strain: Low Risk  (07/07/2022)   Overall Financial Resource Strain (CARDIA)    Difficulty of Paying Living Expenses: Not hard at all  Food Insecurity: No Food Insecurity (07/07/2022)   Hunger Vital Sign    Worried About Running Out of Food in the Last Year: Never true    Ran Out of Food in the Last Year: Never true  Transportation Needs: No Transportation Needs (07/07/2022)   PRAPARE - Hydrologist (Medical): No    Lack of Transportation (Non-Medical): No  Physical Activity: Sufficiently Active (07/07/2022)   Exercise Vital Sign    Days of Exercise per Week: 7 days    Minutes of Exercise per Session: 60 min  Stress: No Stress Concern Present (07/07/2022)   [place]    Feeling of Stress : Not at all  Social Connections: [place] (07/07/2022)   Social Connection and Isolation Panel [NHANES]    Frequency of Communication with Friends and Family: More than three times a  week    Frequency of Social Gatherings with Friends and Family: More than three times a week    Attends Religious Services: More than 4 times per year    Active Member of [employer] or Organizations: Yes    Attends Music therapist: More than 4 times per year    Marital Status: Married    Tobacco Counseling Counseling given: Not Answered   Clinical Intake:  Pre-visit preparation completed: Yes  Pain : No/denies pain Pain Score: 0-No pain     BMI - recorded: 24.21 Nutritional Risks: None Diabetes: Yes CBG done?: No Did pt. bring in CBG monitor from home?: No  How  often do you need to have someone help you when you read instructions, pamphlets, or other written materials from your doctor or pharmacy?: 1 - Never What is the last grade level you completed in school?: HSG; some college  Nutrition Risk Assessment:  Has the patient had any N/V/D within the last 2 months?  No  Does the patient have any non-healing wounds?  No  Has the patient had any unintentional weight loss or weight gain?  No   Diabetes:  Is the patient diabetic?  Yes  If diabetic, was a CBG obtained today?  No  Did the patient bring in their glucometer from home?  No  How often do you monitor your CBG's? Once daily.   Financial Strains and Diabetes Management:  Are you having any financial strains with the device, your supplies or your medication? No .  Does the patient want to be seen by Chronic Care Management for management of their diabetes?  No  Would the patient like to be referred to a Nutritionist or for Diabetic Management?  No   Diabetic Exams:  Diabetic Eye Exam: Completed 07/22/2021. Overdue for diabetic eye exam. Pt has been advised about the importance in completing this exam. A referral has been placed today. Message sent to referral coordinator for scheduling purposes. Advised pt to expect a call from office referred to regarding appt.  Diabetic Foot Exam: Completed 12/07/2021.  Pt has been advised about the importance in completing this exam. Pt is scheduled for diabetic foot exam on 12/07/2022.    Interpreter Needed?: No  Information entered by :: Lisette Abu, LPN.   Activities of Daily Living    07/07/2022   11:41 AM  In your present state of health, do you have any difficulty performing the following activities:  Hearing? 0  Vision? 0  Difficulty concentrating or making decisions? 0  Walking or climbing stairs? 0  Dressing or bathing? 0  Doing errands, shopping? 0  Preparing Food and eating ? N  Using the Toilet? N  In the past six months, have you accidently leaked urine? N  Do you have problems with loss of bowel control? N  Managing your Medications? N  Managing your Finances? N  Housekeeping or managing your Housekeeping? N    Patient Care Team: Horald Pollen, MD as PCP - General (Internal Medicine) Hayden Pedro, MD as Consulting Physician (Ophthalmology) Monna Fam, MD as Consulting Physician (Ophthalmology)  Indicate any recent Medical Services you may have received from other than [facility] providers in the past year (date may be approximate).     Assessment:   This is a routine wellness examination for [place].  Hearing/Vision screen Hearing Screening - Comments:: Denies hearing difficulties   Vision Screening - Comments:: Wear rx glasses - up to date with routine eye exams with Monna Fam, MD.   Dietary issues and exercise activities discussed: Current Exercise Habits: Home exercise routine, Type of exercise: walking, Time (Minutes): 60, Frequency (Times/Week): 7, Weekly Exercise (Minutes/Week): 420, Intensity: Moderate, Exercise limited by: None identified   Goals Addressed             This Visit's Progress    My goal is to stay healthy and well.        Depression Screen    07/07/2022   11:37 AM 04/28/2022    1:43 PM 07/27/2021    9:56 AM 07/05/2021    1:05 PM 03/09/2021    3:06 PM 07/09/2020     2:15 PM  02/21/2020    8:42 AM  PHQ 2/9 Scores  PHQ - 2 Score 0 0 0 0 0 0 0    Fall Risk    07/07/2022   11:34 AM 05/17/2022    1:38 PM 04/28/2022    1:43 PM 07/27/2021    9:56 AM 07/05/2021    1:03 PM  Fall Risk   Falls in the past year? 0 0 0 0 0  Number falls in past yr: 0 0  0 0  Injury with Fall? 0 0  0 0  Risk for fall due to : No Fall Risks      Follow up Falls prevention discussed        FALL RISK PREVENTION PERTAINING TO THE HOME:  Any stairs in or around the home? No  If so, are there any without handrails? No  Home free of loose throw rugs in walkways, pet beds, electrical cords, etc? Yes  Adequate lighting in your home to reduce risk of falls? Yes   ASSISTIVE DEVICES UTILIZED TO PREVENT FALLS:  Life alert? No  Use of a cane, walker or w/c? No  Grab bars in the bathroom? Yes  Shower chair or bench in shower? Yes  Elevated toilet seat or a handicapped toilet? No   TIMED UP AND GO:  Was the test performed? No . Phone Visit  Cognitive Function:    06/17/2016    1:33 PM  MMSE - Mini Mental State Exam  Orientation to time 5  Orientation to Place 5  Registration 3  Attention/ Calculation 5  Recall 2  Language- name 2 objects 2  Language- repeat 1  Language- follow 3 step command 3  Language- read & follow direction 1  Write a sentence 1  Copy design 1  Total score 29      05/17/2022    3:00 PM  Montreal Cognitive Assessment   Attention: Read list of digits (0/2) 2  Attention: Read list of letters (0/1) 1  Attention: Serial 7 subtraction starting at 100 (0/3) 3  Language: Repeat phrase (0/2) 0  Language : Fluency (0/1) 0  Abstraction (0/2) 0  Delayed Recall (0/5) 0  Orientation (0/6) 6      07/07/2022   11:35 AM 07/05/2021    1:07 PM 06/14/2019    1:05 PM 07/08/2017    3:05 PM  6CIT Screen  What Year? 0 points 0 points 0 points 0 points  What month? 0 points 0 points 0 points 0 points  What time? 0 points 0 points 0 points 0 points  Count back  from 20 0 points 0 points 0 points 0 points  Months in reverse 0 points 0 points 0 points 0 points  Repeat phrase 0 points 10 points 0 points 6 points  Total Score 0 points 10 points 0 points 6 points    Immunizations Immunization History  Administered Date(s) Administered   Fluad Quad(high Dose 65+) 07/06/2021   PFIZER(Purple Top)SARS-COV-2 Vaccination 12/03/2019, 12/29/2019, 08/02/2020   Pneumococcal Polysaccharide-23 02/21/2020   Tdap 10/18/2010    TDAP status: Due, Education has been provided regarding the importance of this vaccine. Advised may receive this vaccine at local pharmacy or Health Dept. Aware to provide a copy of the vaccination record if obtained from local pharmacy or Health Dept. Verbalized acceptance and understanding.  Flu Vaccine status: Due, Education has been provided regarding the importance of this vaccine. Advised may receive this vaccine at local pharmacy or Health Dept. Aware to provide a  copy of the vaccination record if obtained from local pharmacy or Health Dept. Verbalized acceptance and understanding. (No record found)  Pneumococcal vaccine status: Due, Education has been provided regarding the importance of this vaccine. Advised may receive this vaccine at local pharmacy or Health Dept. Aware to provide a copy of the vaccination record if obtained from local pharmacy or Health Dept. Verbalized acceptance and understanding.  Covid-19 vaccine status: Completed vaccines  Qualifies for Shingles Vaccine? Yes   Zostavax completed No   Shingrix Completed?: No.    Education has been provided regarding the importance of this vaccine. Patient has been advised to call insurance company to determine out of pocket expense if they have not yet received this vaccine. Advised may also receive vaccine at local pharmacy or Health Dept. Verbalized acceptance and understanding.  Screening Tests Health Maintenance  Topic Date Due   Zoster Vaccines- Shingrix (1 of 2) Never  done   COVID-19 Vaccine (4 - Pfizer series) 09/27/2020   TETANUS/TDAP  10/18/2020   Pneumonia Vaccine 14+ Years old (2 - PCV) 02/20/2021   INFLUENZA VACCINE  05/18/2022   OPHTHALMOLOGY EXAM  07/22/2022   HEMOGLOBIN A1C  10/29/2022   FOOT EXAM  12/07/2022   Diabetic kidney evaluation - GFR measurement  04/29/2023   Diabetic kidney evaluation - Urine ACR  04/29/2023   HPV VACCINES  Aged Out   COLONOSCOPY (Pts 45-22yr Insurance coverage will need to be confirmed)  Discontinued    Health Maintenance  Health Maintenance Due  Topic Date Due   Zoster Vaccines- Shingrix (1 of 2) Never done   COVID-19 Vaccine (4 - Pfizer series) 09/27/2020   TETANUS/TDAP  10/18/2020   Pneumonia Vaccine 81 Years old (2 - PCV) 02/20/2021   INFLUENZA VACCINE  05/18/2022    Colorectal cancer screening: No longer required.   Lung Cancer Screening: (Low Dose CT Chest recommended if Age 81-80years, 30 pack-year currently smoking OR have quit w/in 15years.) does not qualify.   Lung Cancer Screening Referral: no  Additional Screening:  Hepatitis C Screening: does not qualify; Completed no  Vision Screening: Recommended annual ophthalmology exams for early detection of glaucoma and other disorders of the eye. Is the patient up to date with their annual eye exam?  Yes  Who is the provider or what is the name of the office in which the patient attends annual eye exams? KMonna Fam MD. If pt is not established with a provider, would they like to be referred to a provider to establish care? No .   Dental Screening: Recommended annual dental exams for proper oral hygiene  Community Resource Referral / Chronic Care Management: CRR required this visit?  No   CCM required this visit?  No      Plan:     I have personally reviewed and noted the following in the patient's chart:   Medical and social history Use of alcohol, tobacco or illicit drugs  Current medications and supplements including opioid  prescriptions. Patient is not currently taking opioid prescriptions. Functional ability and status Nutritional status Physical activity Advanced directives List of other physicians Hospitalizations, surgeries, and ER visits in previous 12 months Vitals Screenings to include cognitive, depression, and falls Referrals and appointments  In addition, I have reviewed and discussed with patient certain preventive protocols, quality metrics, and best practice recommendations. A written personalized care plan for preventive services as well as general preventive health recommendations were provided to patient.     SSheral Flow LPN  07/07/2022   Nurse Notes:  Patient has current diagnosis of cognitive impairment. Patient is followed by neurology for ongoing assessment.  Patient was able to complete 6CIT.

## 2022-07-07 NOTE — Patient Instructions (Signed)
[name] , Thank you for taking time to come for your Medicare Wellness Visit. I appreciate your ongoing commitment to your health goals. Please review the following plan we discussed and let me know if I can assist you in the future.   Screening recommendations/referrals: Colonoscopy: Last done on 03/19/2020; no longer recommended due to age Recommended yearly ophthalmology/optometry visit for glaucoma screening and checkup Recommended yearly dental visit for hygiene and checkup  Vaccinations: Influenza vaccine: recommend every Fall Pneumococcal vaccine: 02/21/2020 (Pneumococcal 23); need second dose (Prevnar 20) Tdap vaccine: due; last done 10/18/2010 (due every 10 years) Shingles vaccine: due; recommend Shingrix which is 2 doses 2-6 months apart and over 90% effective     Covid-19: 12/03/2019, 12/29/2019, 08/02/2020, 04/29/2021 RSV vaccine: due (check with [facility] or [facility])  Advanced directives: Yes; Please bring a copy of your health care power of attorney and living will to the office at your convenience.  Conditions/risks identified: Yes; Type II Diabetes Mellitus  Next appointment: Follow up in one year for your annual wellness visit on   Preventive Care 65 Years and Older, Male  Preventive care refers to lifestyle choices and visits with your health care provider that can promote health and wellness. What does preventive care include? A yearly physical exam. This is also called an annual well check. Dental exams once or twice a year. Routine eye exams. Ask your health care provider how often you should have your eyes checked. Personal lifestyle choices, including: Daily care of your teeth and gums. Regular physical activity. Eating a healthy diet. Avoiding tobacco and drug use. Limiting alcohol use. Practicing safe sex. Taking low doses of aspirin every day. Taking vitamin and mineral supplements as recommended by your health care provider. What happens during an annual well  check? The services and screenings done by your health care provider during your annual well check will depend on your age, overall health, lifestyle risk factors, and family history of disease. Counseling  Your health care provider may ask you questions about your: Alcohol use. Tobacco use. Drug use. Emotional well-being. Home and relationship well-being. Sexual activity. Eating habits. History of falls. Memory and ability to understand (cognition). Work and work Statistician. Screening  You may have the following tests or measurements: Height, weight, and BMI. Blood pressure. Lipid and cholesterol levels. These may be checked every 5 years, or more frequently if you are over 77 years old. Skin check. Lung cancer screening. You may have this screening every year starting at age 46 if you have a 30-pack-year history of smoking and currently smoke or have quit within the past 15 years. Fecal occult blood test (FOBT) of the stool. You may have this test every year starting at age 48. Flexible sigmoidoscopy or colonoscopy. You may have a sigmoidoscopy every 5 years or a colonoscopy every 10 years starting at age 36. Prostate cancer screening. Recommendations will vary depending on your family history and other risks. Hepatitis C blood test. Hepatitis B blood test. Sexually transmitted disease (STD) testing. Diabetes screening. This is done by checking your blood sugar (glucose) after you have not eaten for a while (fasting). You may have this done every 1-3 years. Abdominal aortic aneurysm (AAA) screening. You may need this if you are a current or former smoker. Osteoporosis. You may be screened starting at age 67 if you are at high risk. Talk with your health care provider about your test results, treatment options, and if necessary, the need for more tests. Vaccines  Your  health care provider may recommend certain vaccines, such as: Influenza vaccine. This is recommended every  year. Tetanus, diphtheria, and acellular pertussis (Tdap, Td) vaccine. You may need a Td booster every 10 years. Zoster vaccine. You may need this after age 74. Pneumococcal 13-valent conjugate (PCV13) vaccine. One dose is recommended after age 43. Pneumococcal polysaccharide (PPSV23) vaccine. One dose is recommended after age 41. Talk to your health care provider about which screenings and vaccines you need and how often you need them. This information is not intended to replace advice given to you by your health care provider. Make sure you discuss any questions you have with your health care provider. Document Released: 10/31/2015 Document Revised: 06/23/2016 Document Reviewed: 08/05/2015 Elsevier Interactive Patient Education  2017 [place] Prevention in the Home Falls can cause injuries. They can happen to people of all ages. There are many things you can do to make your home safe and to help prevent falls. What can I do on the outside of my home? Regularly fix the edges of walkways and driveways and fix any cracks. Remove anything that might make you trip as you walk through a door, such as a raised step or threshold. Trim any bushes or trees on the path to your home. Use bright outdoor lighting. Clear any walking paths of anything that might make someone trip, such as rocks or tools. Regularly check to see if handrails are loose or broken. Make sure that both sides of any steps have handrails. Any raised decks and porches should have guardrails on the edges. Have any leaves, snow, or ice cleared regularly. Use sand or salt on walking paths during winter. Clean up any spills in your garage right away. This includes oil or grease spills. What can I do in the bathroom? Use night lights. Install grab bars by the toilet and in the tub and shower. Do not use towel bars as grab bars. Use non-skid mats or decals in the tub or shower. If you need to sit down in the shower, use a  plastic, non-slip stool. Keep the floor dry. Clean up any water that spills on the floor as soon as it happens. Remove soap buildup in the tub or shower regularly. Attach bath mats securely with double-sided non-slip rug tape. Do not have throw rugs and other things on the floor that can make you trip. What can I do in the bedroom? Use night lights. Make sure that you have a light by your bed that is easy to reach. Do not use any sheets or blankets that are too big for your bed. They should not hang down onto the floor. Have a firm chair that has side arms. You can use this for support while you get dressed. Do not have throw rugs and other things on the floor that can make you trip. What can I do in the kitchen? Clean up any spills right away. Avoid walking on wet floors. Keep items that you use a lot in easy-to-reach places. If you need to reach something above you, use a strong step stool that has a grab bar. Keep electrical cords out of the way. Do not use floor polish or wax that makes floors slippery. If you must use wax, use non-skid floor wax. Do not have throw rugs and other things on the floor that can make you trip. What can I do with my stairs? Do not leave any items on the stairs. Make sure that there are handrails  on both sides of the stairs and use them. Fix handrails that are broken or loose. Make sure that handrails are as long as the stairways. Check any carpeting to make sure that it is firmly attached to the stairs. Fix any carpet that is loose or worn. Avoid having throw rugs at the top or bottom of the stairs. If you do have throw rugs, attach them to the floor with carpet tape. Make sure that you have a light switch at the top of the stairs and the bottom of the stairs. If you do not have them, ask someone to add them for you. What else can I do to help prevent falls? Wear shoes that: Do not have high heels. Have rubber bottoms. Are comfortable and fit you  well. Are closed at the toe. Do not wear sandals. If you use a stepladder: Make sure that it is fully opened. Do not climb a closed stepladder. Make sure that both sides of the stepladder are locked into place. Ask someone to hold it for you, if possible. Clearly mark and make sure that you can see: Any grab bars or handrails. First and last steps. Where the edge of each step is. Use tools that help you move around (mobility aids) if they are needed. These include: Canes. Walkers. Scooters. Crutches. Turn on the lights when you go into a dark area. Replace any light bulbs as soon as they burn out. Set up your furniture so you have a clear path. Avoid moving your furniture around. If any of your floors are uneven, fix them. If there are any pets around you, be aware of where they are. Review your medicines with your doctor. Some medicines can make you feel dizzy. This can increase your chance of falling. Ask your doctor what other things that you can do to help prevent falls. This information is not intended to replace advice given to you by your health care provider. Make sure you discuss any questions you have with your health care provider. Document Released: 07/31/2009 Document Revised: 03/11/2016 Document Reviewed: 11/08/2014 Elsevier Interactive Patient Education  2017 [employer].

## 2022-07-13 DIAGNOSIS — Z23 Encounter for immunization: Secondary | ICD-10-CM | POA: Diagnosis not present

## 2022-07-29 ENCOUNTER — Ambulatory Visit (INDEPENDENT_AMBULATORY_CARE_PROVIDER_SITE_OTHER): Payer: Medicare Other | Admitting: Emergency Medicine

## 2022-07-29 ENCOUNTER — Encounter: Payer: Self-pay | Admitting: Emergency Medicine

## 2022-07-29 ENCOUNTER — Telehealth: Payer: Self-pay | Admitting: Emergency Medicine

## 2022-07-29 VITALS — BP 124/76 | HR 77 | Temp 98.3°F | Ht 66.0 in | Wt 145.0 lb

## 2022-07-29 DIAGNOSIS — E1159 Type 2 diabetes mellitus with other circulatory complications: Secondary | ICD-10-CM | POA: Diagnosis not present

## 2022-07-29 DIAGNOSIS — Z87891 Personal history of nicotine dependence: Secondary | ICD-10-CM

## 2022-07-29 DIAGNOSIS — Z23 Encounter for immunization: Secondary | ICD-10-CM

## 2022-07-29 DIAGNOSIS — I152 Hypertension secondary to endocrine disorders: Secondary | ICD-10-CM

## 2022-07-29 DIAGNOSIS — R634 Abnormal weight loss: Secondary | ICD-10-CM | POA: Diagnosis not present

## 2022-07-29 DIAGNOSIS — N401 Enlarged prostate with lower urinary tract symptoms: Secondary | ICD-10-CM

## 2022-07-29 DIAGNOSIS — E785 Hyperlipidemia, unspecified: Secondary | ICD-10-CM

## 2022-07-29 DIAGNOSIS — N400 Enlarged prostate without lower urinary tract symptoms: Secondary | ICD-10-CM

## 2022-07-29 DIAGNOSIS — E1169 Type 2 diabetes mellitus with other specified complication: Secondary | ICD-10-CM | POA: Diagnosis not present

## 2022-07-29 DIAGNOSIS — N138 Other obstructive and reflux uropathy: Secondary | ICD-10-CM

## 2022-07-29 DIAGNOSIS — R972 Elevated prostate specific antigen [PSA]: Secondary | ICD-10-CM

## 2022-07-29 DIAGNOSIS — R413 Other amnesia: Secondary | ICD-10-CM

## 2022-07-29 LAB — POCT GLYCOSYLATED HEMOGLOBIN (HGB A1C): Hemoglobin A1C: 5.9 % — AB (ref 4.0–5.6)

## 2022-07-29 NOTE — Patient Instructions (Signed)
Health Maintenance After Age 81 After age 81, you are at a higher risk for certain long-term diseases and infections as well as injuries from falls. Falls are a major cause of broken bones and head injuries in people who are older than age 81. Getting regular preventive care can help to keep you healthy and well. Preventive care includes getting regular testing and making lifestyle changes as recommended by your health care provider. Talk with your health care provider about: Which screenings and tests you should have. A screening is a test that checks for a disease when you have no symptoms. A diet and exercise plan that is right for you. What should I know about screenings and tests to prevent falls? Screening and testing are the best ways to find a health problem early. Early diagnosis and treatment give you the best chance of managing medical conditions that are common after age 81. Certain conditions and lifestyle choices may make you more likely to have a fall. Your health care provider may recommend: Regular vision checks. Poor vision and conditions such as cataracts can make you more likely to have a fall. If you wear glasses, make sure to get your prescription updated if your vision changes. Medicine review. Work with your health care provider to regularly review all of the medicines you are taking, including over-the-counter medicines. Ask your health care provider about any side effects that may make you more likely to have a fall. Tell your health care provider if any medicines that you take make you feel dizzy or sleepy. Strength and balance checks. Your health care provider may recommend certain tests to check your strength and balance while standing, walking, or changing positions. Foot health exam. Foot pain and numbness, as well as not wearing proper footwear, can make you more likely to have a fall. Screenings, including: Osteoporosis screening. Osteoporosis is a condition that causes  the bones to get weaker and break more easily. Blood pressure screening. Blood pressure changes and medicines to control blood pressure can make you feel dizzy. Depression screening. You may be more likely to have a fall if you have a fear of falling, feel depressed, or feel unable to do activities that you used to do. Alcohol use screening. Using too much alcohol can affect your balance and may make you more likely to have a fall. Follow these instructions at home: Lifestyle Do not drink alcohol if: Your health care provider tells you not to drink. If you drink alcohol: Limit how much you have to: 0-1 drink a day for women. 0-2 drinks a day for men. Know how much alcohol is in your drink. In the [place] one drink equals one 12 oz bottle of beer (355 mL), one 5 oz glass of wine (148 mL), or one 1 oz glass of hard liquor (44 mL). Do not use any products that contain nicotine or tobacco. These products include cigarettes, chewing tobacco, and vaping devices, such as e-cigarettes. If you need help quitting, ask your health care provider. Activity  Follow a regular exercise program to stay fit. This will help you maintain your balance. Ask your health care provider what types of exercise are appropriate for you. If you need a cane or walker, use it as recommended by your health care provider. Wear supportive shoes that have nonskid soles. Safety  Remove any tripping hazards, such as rugs, cords, and clutter. Install safety equipment such as grab bars in bathrooms and safety rails on stairs. Keep rooms and walkways   well-lit. General instructions Talk with your health care provider about your risks for falling. Tell your health care provider if: You fall. Be sure to tell your health care provider about all falls, even ones that seem minor. You feel dizzy, tiredness (fatigue), or off-balance. Take over-the-counter and prescription medicines only as told by your health care provider. These include  supplements. Eat a healthy diet and maintain a healthy weight. A healthy diet includes low-fat dairy products, low-fat (lean) meats, and fiber from whole grains, beans, and lots of fruits and vegetables. Stay current with your vaccines. Schedule regular health, dental, and eye exams. Summary Having a healthy lifestyle and getting preventive care can help to protect your health and wellness after age 81. Screening and testing are the best way to find a health problem early and help you avoid having a fall. Early diagnosis and treatment give you the best chance for managing medical conditions that are more common for people who are older than age 81. Falls are a major cause of broken bones and head injuries in people who are older than age 81. Take precautions to prevent a fall at home. Work with your health care provider to learn what changes you can make to improve your health and wellness and to prevent falls. This information is not intended to replace advice given to you by your health care provider. Make sure you discuss any questions you have with your health care provider. Document Revised: 02/23/2021 Document Reviewed: 02/23/2021 Elsevier Patient Education  2023 Elsevier Inc.  

## 2022-07-29 NOTE — Telephone Encounter (Signed)
Daughter has concerns about his health.  He has appointment today and she is not able to come to the appointment today.  She would like the nurse to call her before her father's appointment.

## 2022-07-29 NOTE — Assessment & Plan Note (Signed)
Recently started on Namenda by neurologist.  Tolerating medication well.

## 2022-07-29 NOTE — Assessment & Plan Note (Signed)
Stable.  Continue Flomax 0.4 mg at bedtime 

## 2022-07-29 NOTE — Progress Notes (Signed)
Tony Price 81 y.o.   Chief Complaint  Patient presents with   Follow-up    67mth f/u appt, Pt daughter concern about pt having anxiety, poor appetite. Daughter unable to make it the appointment.    Weight Loss    Patient has concerns about his weight     HISTORY OF PRESENT ILLNESS: This is a 81y.o. male here for 367-monthollow-up.  Accompanied by son. Since last office visit patient was seen by neurologist for assessment of memory problems and cognitive decline. Neurologist assessment and plan as follows: Recommendations:   Neurocognitive evaluation at our office MRI of the brain, the radiology office will call you to arrange you appointment Check labs today Start Memantine 58m9mablets.  Take 1 tablet at bedtime for 2 weeks, then 1 tablet twice daily.   Side effects include dizziness, headache, diarrhea or constipation.  Call if severe symptoms appear  Follow up in 3 month   MRI of brain was done: No acute MRI changes, mild age-related volume loss, there is chronic small vessel changes, make sure that you monitor your blood pressure, cholesterol and sugars. Thank you  Concerns about unintended weight loss.  Work-up so far negative.  Patient states he is eating only 2 meals a day.  He appears well and in good spirits. Denies that he is depressed or anxious.  Overall feels well.  Stays physically active.  Has no complaints today. Has history of enlarged prostate Most recent urology evaluation assessment and plan as follows:   Assessment: 1. BPH with obstruction/lower urinary tract symptoms  2. Elevated PSA   1. MRI showed only a very large prostate gland at 113 cc. No lesions to biopsy or suspicious for cancer. He has an IPSS score of 11 points but pleased with the quality of life. We will start him on Flomax 0,4 mg daily for 6 months. He will follow in 6 months with a PSA total and free. I spent 30 minutes face to face with the patient, over half in discussion of the  diagnosis and the importance of compliance with the treatment plan as well as counselling the patient and coordinating follow-up care. 1. Elevated PSA: Patient has persistent elevated PSA and this could be due to severely enlarged prostate gland at 113 cc in the MRI. He will follow in 6 months with PSA total and free.   Plan: 1. FU in 6 months 2. Flomax 0,4 mg 1 daily PSA total and free in 6 months  Electronically signed by: AleEzzie DuralD 11/12/2021 8:58 AM   HPI   Prior to Admission medications   Medication Sig Start Date End Date Taking? Authorizing Provider  amLODipine (NORVASC) 10 MG tablet TAKE 1 TABLET BY MOUTH EVERY DAY 03/18/22  Yes Viveca Beckstrom, MigInes BloomerD  Ascorbic Acid (VITAMIN C) 500 MG CAPS See admin instructions.   Yes [provider]  aspirin 81 MG EC tablet 1 tablet   Yes [provider]  atorvastatin (LIPITOR) 20 MG tablet Take 1 tablet by mouth daily.   Yes [provider]  blood glucose meter kit and supplies KIT 1 touch ultra meter.   Check blood sugar once daily E11.9 07/14/17  Yes GreWendie AgresteD  Blood Glucose Monitoring Suppl (ONE TOUCH ULTRA SYSTEM KIT) w/Device KIT Test blood sugar once daily. Dx: E11.9 07/01/16  Yes GreWendie AgresteD  carboxymethylcellulose (REFRESH PLUS) 0.5 % SOLN 1 drop 2 (two) times daily as needed. Into both eyes  Yes [provider]  glimepiride (AMARYL) 2 MG tablet TAKE 1 TABLET BY MOUTH TWICE A DAY 03/18/22  Yes Karion Cudd, [place], MD  glucose blood test strip Test blood sugar once daily. Dx: E11.9 07/04/17  Yes [name], MD  Lancets [facility] ULTRASOFT) lancets Test blood sugar once daily. Dx: E11.9 07/04/17  Yes [name], MD  latanoprost (XALATAN) 0.005 % ophthalmic solution 1 drop at bedtime. 08/31/19  Yes [provider]  memantine (NAMENDA) 5 MG tablet TAKE 1 TABLET (5 MG AT NIGHT) FOR 2 WEEKS, THEN INCREASE TO 1 TABLET (5 MG) TWICE A DAY 06/10/22   Yes Wertman, Sara E, PA-C  neomycin-polymyxin b-dexamethasone (MAXITROL) 3.5-10000-0.1 OINT SMARTSIG:Topical 06/26/20  Yes [provider]  tamsulosin (FLOMAX) 0.4 MG CAPS capsule Take 0.4 mg by mouth.   Yes [provider]  triamcinolone cream (KENALOG) 0.1 % APPLY TO AFFECTED AREA TWICE A DAY 07/20/21  Yes Zoe Nordin, Ines Bloomer, MD  valsartan (DIOVAN) 160 MG tablet Take 1 tablet (160 mg total) by mouth daily. 06/21/22  Yes Horald Pollen, MD    No Known Allergies  Patient Active Problem List   Diagnosis Date Noted   Weight loss 04/28/2022   Memory problem 04/28/2022   Acute cystitis without hematuria 10/01/2021   Recent urinary tract infection 10/01/2021   Erectile dysfunction due to arterial insufficiency 09/14/2021   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   Personal history of colonic polyps 11/04/2020   Diabetic retinopathy ([place]) 03/31/2016   Dyslipidemia associated with type 2 diabetes mellitus ([place]) 11/17/2014   Hypertension associated with diabetes ([place]) 11/17/2014    Past Medical History:  Diagnosis Date   Chest pain    Diabetes mellitus    Hypercholesterolemia    Hypertension    Kidney stones     Past Surgical History:  Procedure Laterality Date   CATARACT EXTRACTION     EYE SURGERY     PROSTATE BIOPSY     ROTATOR CUFF REPAIR     Right    Social History   Socioeconomic History   Marital status: Divorced    Spouse name: Not on file   Number of children: 3   Years of education: 12   Highest education level: Not on file  Occupational History   Not on file  Tobacco Use   Smoking status: Former    Years: 2.00    Types: Cigarettes   Smokeless tobacco: Never  Substance and Sexual Activity   Alcohol use: No    Alcohol/week: 0.0 standard drinks of alcohol   Drug use: No   Sexual activity: Not on file  Other Topics Concern   Not on file  Social History Narrative   Right handed   No caffeine   No falls   One  story home   Social Determinants of Health   Financial Resource Strain: Low Risk  (07/07/2022)   Overall Financial Resource Strain (CARDIA)    Difficulty of Paying Living Expenses: Not hard at all  Food Insecurity: No Food Insecurity (07/07/2022)   Hunger Vital Sign    Worried About Running Out of Food in the Last Year: Never true    Ran Out of Food in the Last Year: Never true  Transportation Needs: No Transportation Needs (07/07/2022)   PRAPARE - Hydrologist (Medical): No    Lack of Transportation (Non-Medical): No  Physical Activity: Sufficiently Active (07/07/2022)   Exercise Vital Sign    Days  of Exercise per Week: 7 days    Minutes of Exercise per Session: 60 min  Stress: No Stress Concern Present (07/07/2022)   [place]    Feeling of Stress : Not at all  Social Connections: [place] (07/07/2022)   Social Connection and Isolation Panel [NHANES]    Frequency of Communication with Friends and Family: More than three times a week    Frequency of Social Gatherings with Friends and Family: More than three times a week    Attends Religious Services: More than 4 times per year    Active Member of [employer] or Organizations: Yes    Attends Music therapist: More than 4 times per year    Marital Status: Married  Human resources officer Violence: Not At Risk (07/07/2022)   Humiliation, Afraid, Rape, and Kick questionnaire    Fear of Current or Ex-Partner: No    Emotionally Abused: No    Physically Abused: No    Sexually Abused: No    Family History  Family history unknown: Yes     Review of Systems  Constitutional:  Positive for weight loss. Negative for chills, fever and malaise/fatigue.  HENT: Negative.  Negative for congestion and sore throat.   Respiratory: Negative.  Negative for cough and shortness of breath.   Cardiovascular: Negative.  Negative for chest pain and  palpitations.  Gastrointestinal:  Negative for nausea and vomiting.  Genitourinary: Negative.   Skin: Negative.  Negative for rash.  Neurological: Negative.  Negative for dizziness and headaches.    Today's Vitals   07/29/22 1008  BP: 124/76  Pulse: 77  Temp: 98.3 F (36.8 C)  TempSrc: Oral  SpO2: 95%  Weight: 145 lb (65.8 kg)  Height: '5\' 6"'  (1.676 m)   Body mass index is 23.4 kg/m. Wt Readings from Last 3 Encounters:  07/29/22 145 lb (65.8 kg)  07/07/22 150 lb (68 kg)  05/17/22 146 lb (66.2 kg)    Physical Exam Vitals reviewed.  Constitutional:      Appearance: Normal appearance.  HENT:     Head: Normocephalic.     Mouth/Throat:     Mouth: Mucous membranes are moist.     Pharynx: Oropharynx is clear.  Eyes:     Extraocular Movements: Extraocular movements intact.     Conjunctiva/sclera: Conjunctivae normal.     Pupils: Pupils are equal, round, and reactive to light.  Cardiovascular:     Rate and Rhythm: Normal rate and regular rhythm.     Pulses: Normal pulses.     Heart sounds: Normal heart sounds.  Pulmonary:     Effort: Pulmonary effort is normal.     Breath sounds: Normal breath sounds.  Abdominal:     Palpations: Abdomen is soft.     Tenderness: There is no abdominal tenderness.  Musculoskeletal:        General: Normal range of motion.     Cervical back: No tenderness.     Right lower leg: No edema.     Left lower leg: No edema.  Lymphadenopathy:     Head:     Right side of head: No submandibular adenopathy.     Left side of head: No submandibular adenopathy.     Cervical: No cervical adenopathy.     Upper Body:     Right upper body: No supraclavicular or axillary adenopathy.     Left upper body: No supraclavicular or axillary adenopathy.     Lower Body:  No right inguinal adenopathy. No left inguinal adenopathy.  Skin:    General: Skin is warm and dry.  Neurological:     General: No focal deficit present.     Mental Status: He is alert and  oriented to person, place, and time.  Psychiatric:        Mood and Affect: Mood normal.        Behavior: Behavior normal.    Results for orders placed or performed in visit on 07/29/22 (from the past 24 hour(s))  POCT HgB A1C     Status: Abnormal   Collection Time: 07/29/22 10:54 AM  Result Value Ref Range   Hemoglobin A1C 5.9 (A) 4.0 - 5.6 %   HbA1c POC (<> result, manual entry)     HbA1c, POC (prediabetic range)     HbA1c, POC (controlled diabetic range)       ASSESSMENT & PLAN: A total of 49 minutes was spent with the patient and counseling/coordination of care regarding preparing for this visit, review of most recent office visit notes, review of most recent neurologist office visit notes, review of most recent MRI of brain report, review of most recent blood work results, review of multiple chronic medical problems under management, review of all medications, education on nutrition, prognosis, documentation, need for follow-up.  Problem List Items Addressed This Visit       Cardiovascular and Mediastinum   Hypertension associated with diabetes ([place]) - Primary    Well-controlled hypertension. Continue valsartan 160 mg and amlodipine 10 mg daily. BP Readings from Last 3 Encounters:  07/29/22 124/76  05/17/22 121/70  04/28/22 110/60  Controlled diabetes with hemoglobin A1c at 5.9. Continue glimepiride 2 mg twice a day Diet and nutrition discussed       Relevant Orders   POCT HgB A1C (Completed)     Endocrine   Dyslipidemia associated with type 2 diabetes mellitus ([place])    Stable.  Diet and nutrition discussed. Continue atorvastatin 20 mg daily.        Genitourinary   BPH with obstruction/lower urinary tract symptoms    Stable.  Continue Flomax 0.4 mg at bedtime.        Other   Weight loss    Stable.  Most likely age-related. So far cancer work-up is negative. Recommend to follow-up with CT of abdomen and pelvis. Former smoker.  Unremarkable recent chest  x-ray. No pulmonary symptoms. No concerns for colon or prostate cancer. Recommend to start eating 3 meals per day and supplement with protein shakes.      Memory problem    Recently started on Namenda by neurologist.  Tolerating medication well.      Other Visit Diagnoses     Unintentional weight loss       Relevant Orders   CT ABDOMEN PELVIS W WO CONTRAST   BPH with elevated PSA       Need for vaccination       Relevant Orders   Flu Vaccine QUAD High Dose(Fluad) (Completed)   Former smoker          Patient Instructions  Health Maintenance After Age 81 After age 23, you are at a higher risk for certain long-term diseases and infections as well as injuries from falls. Falls are a major cause of broken bones and head injuries in people who are older than age 81. Getting regular preventive care can help to keep you healthy and well. Preventive care includes getting regular testing and making lifestyle changes as recommended  by your health care provider. Talk with your health care provider about: Which screenings and tests you should have. A screening is a test that checks for a disease when you have no symptoms. A diet and exercise plan that is right for you. What should I know about screenings and tests to prevent falls? Screening and testing are the best ways to find a health problem early. Early diagnosis and treatment give you the best chance of managing medical conditions that are common after age 35. Certain conditions and lifestyle choices may make you more likely to have a fall. Your health care provider may recommend: Regular vision checks. Poor vision and conditions such as cataracts can make you more likely to have a fall. If you wear glasses, make sure to get your prescription updated if your vision changes. Medicine review. Work with your health care provider to regularly review all of the medicines you are taking, including over-the-counter medicines. Ask your health care  provider about any side effects that may make you more likely to have a fall. Tell your health care provider if any medicines that you take make you feel dizzy or sleepy. Strength and balance checks. Your health care provider may recommend certain tests to check your strength and balance while standing, walking, or changing positions. Foot health exam. Foot pain and numbness, as well as not wearing proper footwear, can make you more likely to have a fall. Screenings, including: Osteoporosis screening. Osteoporosis is a condition that causes the bones to get weaker and break more easily. Blood pressure screening. Blood pressure changes and medicines to control blood pressure can make you feel dizzy. Depression screening. You may be more likely to have a fall if you have a fear of falling, feel depressed, or feel unable to do activities that you used to do. Alcohol use screening. Using too much alcohol can affect your balance and may make you more likely to have a fall. Follow these instructions at home: Lifestyle Do not drink alcohol if: Your health care provider tells you not to drink. If you drink alcohol: Limit how much you have to: 0-1 drink a day for women. 0-2 drinks a day for men. Know how much alcohol is in your drink. In the [place] one drink equals one 12 oz bottle of beer (355 mL), one 5 oz glass of wine (148 mL), or one 1 oz glass of hard liquor (44 mL). Do not use any products that contain nicotine or tobacco. These products include cigarettes, chewing tobacco, and vaping devices, such as e-cigarettes. If you need help quitting, ask your health care provider. Activity  Follow a regular exercise program to stay fit. This will help you maintain your balance. Ask your health care provider what types of exercise are appropriate for you. If you need a cane or walker, use it as recommended by your health care provider. Wear supportive shoes that have nonskid soles. Safety  Remove any  tripping hazards, such as rugs, cords, and clutter. Install safety equipment such as grab bars in bathrooms and safety rails on stairs. Keep rooms and walkways well-lit. General instructions Talk with your health care provider about your risks for falling. Tell your health care provider if: You fall. Be sure to tell your health care provider about all falls, even ones that seem minor. You feel dizzy, tiredness (fatigue), or off-balance. Take over-the-counter and prescription medicines only as told by your health care provider. These include supplements. Eat a healthy diet and maintain a  healthy weight. A healthy diet includes low-fat dairy products, low-fat (lean) meats, and fiber from whole grains, beans, and lots of fruits and vegetables. Stay current with your vaccines. Schedule regular health, dental, and eye exams. Summary Having a healthy lifestyle and getting preventive care can help to protect your health and wellness after age 64. Screening and testing are the best way to find a health problem early and help you avoid having a fall. Early diagnosis and treatment give you the best chance for managing medical conditions that are more common for people who are older than age 11. Falls are a major cause of broken bones and head injuries in people who are older than age 16. Take precautions to prevent a fall at home. Work with your health care provider to learn what changes you can make to improve your health and wellness and to prevent falls. This information is not intended to replace advice given to you by your health care provider. Make sure you discuss any questions you have with your health care provider. Document Revised: 02/23/2021 Document Reviewed: 02/23/2021 Elsevier Patient Education  [place], MD [facility]

## 2022-07-29 NOTE — Assessment & Plan Note (Signed)
Stable.  Diet and nutrition discussed. Continue atorvastatin 20 mg daily. 

## 2022-07-29 NOTE — Assessment & Plan Note (Signed)
Well-controlled hypertension. Continue valsartan 160 mg and amlodipine 10 mg daily. BP Readings from Last 3 Encounters:  07/29/22 124/76  05/17/22 121/70  04/28/22 110/60  Controlled diabetes with hemoglobin A1c at 5.9. Continue glimepiride 2 mg twice a day Diet and nutrition discussed

## 2022-07-29 NOTE — Assessment & Plan Note (Addendum)
Stable.  Most likely age-related. So far cancer work-up is negative. Recommend to follow-up with CT of abdomen and pelvis. Former smoker.  Unremarkable recent chest x-ray. No pulmonary symptoms. No concerns for colon or prostate cancer. Recommend to start eating 3 meals per day and supplement with protein shakes.

## 2022-08-04 ENCOUNTER — Other Ambulatory Visit: Payer: Self-pay | Admitting: Emergency Medicine

## 2022-08-04 ENCOUNTER — Encounter: Payer: Self-pay | Admitting: *Deleted

## 2022-08-04 DIAGNOSIS — E785 Hyperlipidemia, unspecified: Secondary | ICD-10-CM

## 2022-08-04 NOTE — Telephone Encounter (Signed)
This encounter was created in error - please disregard.

## 2022-08-18 ENCOUNTER — Ambulatory Visit: Payer: Medicare Other | Admitting: Physician Assistant

## 2022-08-24 ENCOUNTER — Encounter: Payer: Self-pay | Admitting: Physician Assistant

## 2022-08-24 ENCOUNTER — Ambulatory Visit (INDEPENDENT_AMBULATORY_CARE_PROVIDER_SITE_OTHER): Payer: Medicare Other | Admitting: Physician Assistant

## 2022-08-24 VITALS — BP 127/65 | HR 71 | Resp 20 | Ht 66.0 in | Wt 144.0 lb

## 2022-08-24 DIAGNOSIS — G3184 Mild cognitive impairment, so stated: Secondary | ICD-10-CM

## 2022-08-24 NOTE — Progress Notes (Signed)
Assessment/Plan:   Mild Cognitive Impairment of uncertain etiology  Tony Price is a very pleasant 81 y.o. RH male  with  a history of hypertension, hyperlipidemia, DM2, diabetic retinopathy presenting today in follow-up for evaluation of memory loss. Patient is on memantine 5 mg bid. MoCA blind was inconclusive at the time of his visit due to inattention component.  MMSE today is 24/30.  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  Since his prior evaluation, the patient remains stable from the cognitive standpoint.     Recommendations:   Follow up in 6 months. Continue Memantine 5 mg twice daily. Side effects were discussed  Patient is scheduled for Neuropsych testing on April 2024 for clarity of diagnosis and disease trajectory Continue to control mood as per PCP Recommend good control of cardiovascular risk factors.      Subjective:   This patient is accompanied in the office by his wife  who supplements the history. Previous records as well as any outside records available were reviewed prior to todays visit.  Last seen 05/17/22    Any changes in memory since last visit?  Although patient denies, his wife reports that he may forgetting more often when and where the appointments are.  He denies forgetting any recent conversations sooner frequent basis.  He is still able to remember the names of people that he knows.  He short-term memory is worse than his long-term memory.  During the day he watches TV, go to church, he does not like to do any crossword puzzles or word finding. repeats oneself?  Endorsed Disoriented when walking into a room?  Patient denies being disoriented, but he may forget what he came to the room for. Leaving objects in unusual places?  Patient denies, might misplace the cell phone but finds it. Ambulates  with  difficulty?   Patient denies   Recent falls?  Patient denies   Any head injuries?  Patient denies   History of seizures?   Patient denies   Wandering behavior?  Patient denies   Patient drives?  No issues Any mood changes since last visit?  Patient denies   Any worsening depression?:  Patient denies   Hallucinations?  Patient denies   Paranoia?  Patient denies   Patient reports that sleeps well without vivid dreams, REM behavior or sleepwalking   History of sleep apnea?  Patient denies   Any hygiene concerns?  Patient denies   Independent of bathing and dressing?  Endorsed  Does the patient needs help with medications? Patient in charge  Who is in charge of the finances? Patient  is in charge    Any changes in appetite? Appetite is decreased, "I am fuller than before ".    Patient have trouble swallowing? Patient denies   Does the patient cook?  He continues to cook, he likes to make his sandwiches, he does not cook as often as before, but he denies any kitchen accidents. Any headaches?  Patient denies   Double vision? Patient denies   Any focal numbness or tingling?  Patient denies   Chronic back pain Patient denies   Unilateral weakness?  Patient denies   Any tremors?  Patient denies   Any history of anosmia?  Patient denies   Any incontinence of urine?  He has BPH on Flomax, well controlled Any bowel dysfunction?   Patient denies  Patient lives   with wife daughter and grandaughter     Initial visit 05/17/22 How long did patient have memory difficulties?  About 2 years, short-term memory is worse over the last 2 months.  "If I telling him going to do something, he will ask me again " Patient lives with: His daughter and granddaughter, according to the wife, they bring significant amount of stress, which wife feels that exacerbate his memory difficulties.   Repeats oneself?  Endorsed, he asked the same questions and tells the same stories. Disoriented when walking into a room?   Patient denies   Leaving objects in unusual places?  Patient denies   Ambulates  with difficulty?   Patient denies   Recent falls?  Patient denies   Any head injuries?  Patient denies   History of seizures?   Patient denies   Wandering behavior?  Patient denies   Patient drives?  No issues, may need GPS even when it was a familiar location  Any mood changes? More frustrated than before  Any history of depression?:  Patient denies   Hallucinations?  Patient denies   Paranoia?  Patient denies   Patient reports that he sleeps well without vivid dreams, REM behavior or sleepwalking   History of sleep apnea?  Patient denies   Any hygiene concerns?  Patient denies   Independent of bathing and dressing?  Endorsed  Does the patient needs help with medications?  Patient is in charge Who is in charge of the finances?  Patient is in charge    Patient have trouble swallowing? Patient denies   Does the patient cook?  Patient denies   Any kitchen accidents such as leaving the stove on? Patient denies   Any headaches?  Patient denies   Double vision? Patient denies   Any focal numbness or tingling?  Patient denies   Chronic back pain Patient denies   Unilateral weakness?  Patient denies   Any tremors?  Patient denies   Any history of anosmia?  Patient denies   Any incontinence of urine?  Patient denies   Any bowel dysfunction?   Patient denies      History of heavy alcohol intake?  Patient denies   History of heavy tobacco use?  Patient denies   Family history of dementia?  Mother  and a first cousin had Alzheimer's disease     Past Medical History:  Diagnosis Date   Chest pain    Diabetes mellitus    Hypercholesterolemia    Hypertension    Kidney stones      Past Surgical History:  Procedure Laterality Date   CATARACT EXTRACTION     EYE SURGERY     PROSTATE BIOPSY     ROTATOR CUFF REPAIR     Right     PREVIOUS MEDICATIONS:   CURRENT MEDICATIONS:  Outpatient Encounter  Medications as of 08/24/2022  Medication Sig   amLODipine (NORVASC) 10 MG tablet TAKE 1 TABLET BY MOUTH EVERY DAY   Ascorbic Acid (VITAMIN C) 500 MG CAPS See admin instructions.   aspirin 81 MG EC tablet 1 tablet   atorvastatin (LIPITOR) 20 MG tablet TAKE 1 TABLET BY MOUTH EVERY DAY   blood glucose meter kit and supplies KIT 1 touch ultra meter.   Check blood sugar once daily E11.9   Blood Glucose Monitoring Suppl (ONE TOUCH ULTRA SYSTEM KIT) w/Device KIT Test blood sugar once daily. Dx: E11.9   carboxymethylcellulose (REFRESH PLUS) 0.5 % SOLN 1 drop 2 (two)  times daily as needed. Into both eyes   glimepiride (AMARYL) 2 MG tablet TAKE 1 TABLET BY MOUTH TWICE A DAY   glucose blood test strip Test blood sugar once daily. Dx: E11.9   Lancets (ONETOUCH ULTRASOFT) lancets Test blood sugar once daily. Dx: E11.9   latanoprost (XALATAN) 0.005 % ophthalmic solution 1 drop at bedtime.   memantine (NAMENDA) 5 MG tablet TAKE 1 TABLET (5 MG AT NIGHT) FOR 2 WEEKS, THEN INCREASE TO 1 TABLET (5 MG) TWICE A DAY   neomycin-polymyxin b-dexamethasone (MAXITROL) 3.5-10000-0.1 OINT SMARTSIG:Topical   tamsulosin (FLOMAX) 0.4 MG CAPS capsule Take 0.4 mg by mouth.   triamcinolone cream (KENALOG) 0.1 % APPLY TO AFFECTED AREA TWICE A DAY   valsartan (DIOVAN) 160 MG tablet Take 1 tablet (160 mg total) by mouth daily.   No facility-administered encounter medications on file as of 08/24/2022.     Objective:     PHYSICAL EXAMINATION:    VITALS:   Vitals:   08/24/22 1432  BP: 127/65  Pulse: 71  Resp: 20  SpO2: 100%  Weight: 144 lb (65.3 kg)  Height: _0  (1.676 m)    GEN:  The patient appears stated age and is in NAD. HEENT:  Normocephalic, atraumatic.   Neurological examination:  General: NAD, well-groomed, appears stated age. Orientation: The patient is alert. Oriented to person, place and date Cranial nerves: There is good facial symmetry.The speech is fluent and clear. No aphasia or dysarthria.  Fund of knowledge is appropriate. Recent memory impaired and remote memory is normal.  Attention and concentration are normal.  Able to name objects and repeat phrases.  Hearing is intact to conversational tone.    Sensation: Sensation is intact to light touch throughout Motor: Strength is at least antigravity x4. Tremors: none  DTR's 2/4 in UE/LE      05/17/2022    3:00 PM  Montreal Cognitive Assessment   Attention: Read list of digits (0/2) 2  Attention: Read list of letters (0/1) 1  Attention: Serial 7 subtraction starting at 100 (0/3) 3  Language: Repeat phrase (0/2) 0  Language : Fluency (0/1) 0  Abstraction (0/2) 0  Delayed Recall (0/5) 0  Orientation (0/6) 6       08/24/2022    2:00 PM 06/17/2016    1:33 PM  MMSE - Mini Mental State Exam  Orientation to time 3 5  Orientation to Place 5 5  Registration 3 3  Attention/ Calculation 5 5  Recall 0 2  Language- name 2 objects 2 2  Language- repeat 1 1  Language- follow 3 step command 3 3  Language- read & follow direction 1 1  Write a sentence 1 1  Copy design 0 1  Total score 24 29       Movement examination: Tone: There is normal tone in the UE/LE Abnormal movements:  no tremor.  No myoclonus.  No asterixis.   Coordination:  There is no decremation with RAM's. Normal finger to nose  Gait and Station: The patient has no difficulty arising out of a deep-seated chair without the use of the hands. The patient's stride length is good.  Gait is cautious and narrow.   Thank you for allowing [place] the opportunity to participate in the care of this nice patient. Please do not hesitate to contact us for any questions or concerns.   Total time spent on today's visit was 42 minutes dedicated to this patient today, preparing to see patient, examining the patient, ordering  tests and/or medications and counseling the patient, documenting clinical information in the EHR or other health record, independently interpreting results and  communicating results to the patient/family, discussing treatment and goals, answering patient's questions and coordinating care.  Cc:  Horald Pollen, MD  Sharene Butters 08/24/2022 3:12 PM

## 2022-08-24 NOTE — Patient Instructions (Signed)
It was a pleasure to see you today at our office.   Recommendations:  Neurocognitive evaluation at our office Memantine 5 mg tablets twice daily.   Follow up in 53month  Whom to call:  Memory  decline, memory medications: Call our office [phone]  For psychiatric meds, mood meds: Please have your primary care physician manage these medications.   Counseling regarding caregiver distress, including caregiver depression, anxiety and issues regarding community resources, adult day care programs, adult living facilities, or memory care questions:   Feel free to contact [facility] Social Worker at [phone]  For assessment of decision of mental capacity and competency:  Call Dr. MAnthoney Harada geriatric psychiatrist at [phone] For guidance in geriatric dementia issues please call [facility] [phone] For guidance regarding [facility] and if placement were needed at the facility, contact NArnell Asal Social Worker tel: [phone] If you have any severe symptoms of a stroke, or other severe issues such as confusion,severe chills or fever, etc call 911 or go to the ER as you may need to be evaluated further   Feel free to visit Facebook page " [email or web address]" for tips of how to care for people with memory problems.      RECOMMENDATIONS FOR ALL PATIENTS WITH MEMORY PROBLEMS: 1. Continue to exercise (Recommend 30 minutes of walking everyday, or 3 hours every week) 2. Increase social interactions - [place] friends and family 3. Eat healthy, avoid fried foods and eat more fruits and vegetables 4. Maintain adequate blood pressure, blood sugar, and blood cholesterol level. Reducing the risk of stroke and cardiovascular disease also helps promoting better memory. 5. Avoid stressful situations. Live a simple life and avoid aggravations. Organize your time and prepare for the next day in  anticipation. 6. Sleep well, avoid any interruptions of sleep and avoid any distractions in the bedroom that may interfere with adequate sleep quality 7. Avoid sugar, avoid sweets as there is a strong link between excessive sugar intake, diabetes, and cognitive impairment We discussed the Mediterranean diet, which has been shown to help patients reduce the risk of progressive memory disorders and reduces cardiovascular risk. This includes eating fish, eat fruits and green leafy vegetables, nuts like almonds and hazelnuts, walnuts, and also use olive oil. Avoid fast foods and fried foods as much as possible. Avoid sweets and sugar as sugar use has been linked to worsening of memory function.  There is always a concern of gradual progression of memory problems. If this is the case, then we may need to adjust level of care according to patient needs. Support, both to the patient and caregiver, should then be put into place.      You have been referred for a neuropsychological evaluation (i.e., evaluation of memory and thinking abilities). Please bring someone with you to this appointment if possible, as it is helpful for the doctor to hear from both you and another adult who knows you well. Please bring eyeglasses and hearing aids if you wear them.    The evaluation will take approximately 3 hours and has two parts:   The first part is a clinical interview with the neuropsychologist (Dr. MMelvyn Novasor Dr. SNicole Kindred. During the interview, the neuropsychologist will speak with you and the individual you brought to the appointment.    The second part of the evaluation is testing with the doctor's technician (Hinton Dyeror KMaudie Mercury. During the testing,  the technician will ask you to remember different types of material, solve problems, and answer some questionnaires. Your family member will not be present for this portion of the evaluation.   Please note: We must reserve several hours of the neuropsychologist's time and  the psychometrician's time for your evaluation appointment. As such, there is a No-Show fee of $100. If you are unable to attend any of your appointments, please contact our office as soon as possible to reschedule.    FALL PRECAUTIONS: Be cautious when walking. Scan the area for obstacles that may increase the risk of trips and falls. When getting up in the mornings, sit up at the edge of the bed for a few minutes before getting out of bed. Consider elevating the bed at the head end to avoid drop of blood pressure when getting up. Walk always in a well-lit room (use night lights in the walls). Avoid area rugs or power cords from appliances in the middle of the walkways. Use a walker or a cane if necessary and consider physical therapy for balance exercise. Get your eyesight checked regularly.  FINANCIAL OVERSIGHT: Supervision, especially oversight when making financial decisions or transactions is also recommended.  HOME SAFETY: Consider the safety of the kitchen when operating appliances like stoves, microwave oven, and blender. Consider having supervision and share cooking responsibilities until no longer able to participate in those. Accidents with firearms and other hazards in the house should be identified and addressed as well.   ABILITY TO BE LEFT ALONE: If patient is unable to contact 911 operator, consider using LifeLine, or when the need is there, arrange for someone to stay with patients. Smoking is a fire hazard, consider supervision or cessation. Risk of wandering should be assessed by caregiver and if detected at any point, supervision and safe proof recommendations should be instituted.  MEDICATION SUPERVISION: Inability to self-administer medication needs to be constantly addressed. Implement a mechanism to ensure safe administration of the medications.   DRIVING: Regarding driving, in patients with progressive memory problems, driving will be impaired. We advise to have someone else  do the driving if trouble finding directions or if minor accidents are reported. Independent driving assessment is available to determine safety of driving.   If you are interested in the driving assessment, you can contact the following:  [employer] in [place]  [place] [phone] or [phone]    [place] refers to food and lifestyle choices that are based on the traditions of countries located on the [employer]. This way of eating has been shown to help prevent certain conditions and improve outcomes for people who have chronic diseases, like kidney disease and heart disease. What are tips for following this plan? Lifestyle  Cook and eat meals together with your family, when possible. Drink enough fluid to keep your urine clear or pale yellow. Be physically active every day. This includes: Aerobic exercise like running or swimming. Leisure activities like gardening, walking, or housework. Get 7-8 hours of sleep each night. If recommended by your health care provider, drink red wine in moderation. This means 1 glass a day for nonpregnant women and 2 glasses a day for men. A glass of wine equals 5 oz (150 mL). Reading food labels  Check the serving size of packaged foods. For foods such as rice and pasta, the serving size refers to the amount of cooked product, not dry.  Check the total fat in packaged foods. Avoid foods that have saturated fat or trans fats. Check the ingredients list for added sugars, such as corn syrup. Shopping  At the grocery store, buy most of your food from the areas near the walls of the store. This includes: Fresh fruits and vegetables (produce). Grains, beans, nuts, and seeds. Some of these may be available in unpackaged forms or large amounts (in bulk). Fresh seafood. Poultry and eggs. Low-fat  dairy products. Buy whole ingredients instead of prepackaged foods. Buy fresh fruits and vegetables in-season from local farmers markets. Buy frozen fruits and vegetables in resealable bags. If you do not have access to quality fresh seafood, buy precooked frozen shrimp or canned fish, such as tuna, salmon, or sardines. Buy small amounts of raw or cooked vegetables, salads, or olives from the deli or salad bar at your store. Stock your pantry so you always have certain foods on hand, such as olive oil, canned tuna, canned tomatoes, rice, pasta, and beans. Cooking  Cook foods with extra-virgin olive oil instead of using butter or other vegetable oils. Have meat as a side dish, and have vegetables or grains as your main dish. This means having meat in small portions or adding small amounts of meat to foods like pasta or stew. Use beans or vegetables instead of meat in common dishes like chili or lasagna. Experiment with different cooking methods. Try roasting or broiling vegetables instead of steaming or sauteing them. Add frozen vegetables to soups, stews, pasta, or rice. Add nuts or seeds for added healthy fat at each meal. You can add these to yogurt, salads, or vegetable dishes. Marinate fish or vegetables using olive oil, lemon juice, garlic, and fresh herbs. Meal planning  Plan to eat 1 vegetarian meal one day each week. Try to work up to 2 vegetarian meals, if possible. Eat seafood 2 or more times a week. Have healthy snacks readily available, such as: Vegetable sticks with hummus. Greek yogurt. Fruit and nut trail mix. Eat balanced meals throughout the week. This includes: Fruit: 2-3 servings a day Vegetables: 4-5 servings a day Low-fat dairy: 2 servings a day Fish, poultry, or lean meat: 1 serving a day Beans and legumes: 2 or more servings a week Nuts and seeds: 1-2 servings a day Whole grains: 6-8 servings a day Extra-virgin olive oil: 3-4 servings a day Limit red meat and  sweets to only a few servings a month What are my food choices? Mediterranean diet Recommended Grains: Whole-grain pasta. Brown rice. Bulgar wheat. Polenta. Couscous. Whole-wheat bread. [place]. Vegetables: Artichokes. Beets. Broccoli. Cabbage. Carrots. Eggplant. Green beans. Chard. Kale. Spinach. Onions. Leeks. Peas. Squash. Tomatoes. Peppers. Radishes. Fruits: Apples. Apricots. Avocado. Berries. Bananas. Cherries. Dates. Figs. Grapes. Lemons. Melon. Oranges. Peaches. Plums. Pomegranate. Meats and other protein foods: Beans. Almonds. Sunflower seeds. Pine nuts. Peanuts. [place]. Salmon. Scallops. Shrimp. [place]. Tilapia. Clams. Oysters. Eggs. Dairy: Low-fat milk. Cheese. Greek yogurt. Beverages: Water. Red wine. Herbal tea. Fats and oils: Extra virgin olive oil. Avocado oil. Grape seed oil. Sweets and desserts: [place] yogurt with honey. Baked apples. Poached pears. Trail mix. Seasoning and other foods: Basil. Cilantro. Coriander. Cumin. Mint. Parsley. Sage. Rosemary. Tarragon. Garlic. Oregano. Thyme. Pepper. Balsalmic vinegar. Tahini. Hummus. Tomato sauce. Olives. Mushrooms. Limit these Grains: Prepackaged pasta or rice dishes. Prepackaged cereal with added sugar. Vegetables: Deep fried potatoes (french fries). Fruits: Fruit canned in syrup. Meats and other protein foods: Beef. Pork. Lamb. Poultry with skin. Hot dogs. Bernie[place] Salines. Dairy: Ice cream.  Sour cream. Whole milk. Beverages: Juice. Sugar-sweetened soft drinks. Beer. Liquor and spirits. Fats and oils: Butter. [place] oil. Vegetable oil. Beef fat (tallow). Lard. Sweets and desserts: Cookies. Cakes. Pies. Candy. Seasoning and other foods: Mayonnaise. Premade sauces and marinades. The items listed may not be a complete list. Talk with your dietitian about what dietary choices are right for you. Summary The Mediterranean diet includes both food and lifestyle choices. Eat a variety of fresh fruits and vegetables, beans, nuts, seeds, and whole  grains. Limit the amount of red meat and sweets that you eat. Talk with your health care provider about whether it is safe for you to drink red wine in moderation. This means 1 glass a day for nonpregnant women and 2 glasses a day for men. A glass of wine equals 5 oz (150 mL). This information is not intended to replace advice given to you by your health care provider. Make sure you discuss any questions you have with your health care provider. Document Released: 05/27/2016 Document Revised: 06/29/2016 Document Reviewed: 05/27/2016 Elsevier Interactive Patient Education  2017 [employer].   We have sent a referral to [facility] for your MRI and they will call you directly to schedule your appointment. They are located at [place]. If you need to contact them directly please call [phone].  Your provider has requested that you have labwork completed today. Please go to [facility] ([id number]) on the second floor of this building before leaving the office today. You do not need to check in. If you are not called within 15 minutes please check with the front desk.

## 2022-08-27 ENCOUNTER — Encounter (INDEPENDENT_AMBULATORY_CARE_PROVIDER_SITE_OTHER): Payer: Medicare Other | Admitting: Ophthalmology

## 2022-08-27 DIAGNOSIS — H35371 Puckering of macula, right eye: Secondary | ICD-10-CM

## 2022-08-27 DIAGNOSIS — I10 Essential (primary) hypertension: Secondary | ICD-10-CM

## 2022-08-27 DIAGNOSIS — H35341 Macular cyst, hole, or pseudohole, right eye: Secondary | ICD-10-CM | POA: Diagnosis not present

## 2022-08-27 DIAGNOSIS — H35033 Hypertensive retinopathy, bilateral: Secondary | ICD-10-CM | POA: Diagnosis not present

## 2022-08-27 DIAGNOSIS — H43813 Vitreous degeneration, bilateral: Secondary | ICD-10-CM

## 2022-09-01 ENCOUNTER — Ambulatory Visit
Admission: RE | Admit: 2022-09-01 | Discharge: 2022-09-01 | Disposition: A | Discharge status: Home / Self Care | Payer: Medicare Other | Source: Ambulatory Visit | Attending: Emergency Medicine | Admitting: Emergency Medicine

## 2022-09-01 DIAGNOSIS — N400 Enlarged prostate without lower urinary tract symptoms: Secondary | ICD-10-CM | POA: Diagnosis not present

## 2022-09-01 DIAGNOSIS — N281 Cyst of kidney, acquired: Secondary | ICD-10-CM | POA: Diagnosis not present

## 2022-09-01 DIAGNOSIS — R634 Abnormal weight loss: Secondary | ICD-10-CM

## 2022-09-01 DIAGNOSIS — K3189 Other diseases of stomach and duodenum: Secondary | ICD-10-CM | POA: Diagnosis not present

## 2022-09-01 MED ORDER — IOPAMIDOL (ISOVUE-300) INJECTION 61%
100.0000 mL | Freq: Once | INTRAVENOUS | Status: AC | PRN
  Administered 2022-09-01: 100 mL via INTRAVENOUS

## 2022-09-11 ENCOUNTER — Encounter: Payer: Self-pay | Admitting: Physician Assistant

## 2022-09-11 ENCOUNTER — Encounter: Payer: Self-pay | Admitting: Emergency Medicine

## 2022-09-16 ENCOUNTER — Other Ambulatory Visit: Payer: Self-pay | Admitting: Emergency Medicine

## 2022-09-16 DIAGNOSIS — E11319 Type 2 diabetes mellitus with unspecified diabetic retinopathy without macular edema: Secondary | ICD-10-CM

## 2022-09-20 ENCOUNTER — Encounter: Payer: Self-pay | Admitting: Podiatry

## 2022-09-20 ENCOUNTER — Ambulatory Visit (INDEPENDENT_AMBULATORY_CARE_PROVIDER_SITE_OTHER): Payer: Medicare Other | Admitting: Podiatry

## 2022-09-20 VITALS — BP 120/60

## 2022-09-20 DIAGNOSIS — E1142 Type 2 diabetes mellitus with diabetic polyneuropathy: Secondary | ICD-10-CM | POA: Diagnosis not present

## 2022-09-20 DIAGNOSIS — M79674 Pain in right toe(s): Secondary | ICD-10-CM

## 2022-09-20 DIAGNOSIS — M79675 Pain in left toe(s): Secondary | ICD-10-CM

## 2022-09-20 DIAGNOSIS — B351 Tinea unguium: Secondary | ICD-10-CM

## 2022-09-24 NOTE — Progress Notes (Signed)
  Subjective:  Patient ID: Tony Price, male    DOB: Jun 11, 1941,  MRN: [id number]  Tony Price presents to clinic today for at risk foot care with history of diabetic neuropathy and painful thick toenails that are difficult to trim. Pain interferes with ambulation. Aggravating factors include wearing enclosed shoe gear. Pain is relieved with periodic professional debridement.  Chief Complaint  Patient presents with   Nail Problem    Prediabetic  BS-127 A1C-do not know PCP-Sagardia PCP VST- do not know   New problem(s): None.   PCP is Sagardia, Ines Bloomer, MD.  No Known Allergies  Review of Systems: Negative except as noted in the HPI.  Objective: No changes noted in today's physical examination. Vitals:   09/20/22 1041  BP: 120/60   Tony Price is a pleasant 81 y.o. male WD, WN in NAD. AAO x 3. Vascular Capillary fill time to digits immediate b/l.  DP/PT pulse(s) are palpable b/l lower extremities. Pedal hair sparse. Lower extremity skin temperature gradient within normal limits. No pain with calf compression b/l. No edema noted b/l lower extremities. No cyanosis or clubbing noted.   Neurologic Protective sensation intact 5/5 intact bilaterally with 10g monofilament b/l. Vibratory sensation intact b/l. No clonus b/l. Pt has subjective symptoms of neuropathy.  Dermatologic Pedal skin is warm and supple b/l.  No open wounds b/l lower extremities. No interdigital macerations b/l lower extremities. Toenails 1-5 b/l elongated, discolored, dystrophic, thickened, crumbly with subungual debris and tenderness to dorsal palpation. No hyperkeratotic nor porokeratotic lesions present on today's visit.  Orthopedic: Normal muscle strength 5/5 to all lower extremity muscle groups bilaterally. Patient ambulates independent of any assistive aids. HAV with bunion deformity noted b/l LE.   Assessment/Plan: 1. Pain due to onychomycosis of toenails of both feet   2. Diabetic  peripheral neuropathy associated with type 2 diabetes mellitus ([place])     No orders of the defined types were placed in this encounter.  -Consent given for treatment as described below: -Continue supportive shoe gear daily. -Mycotic toenails 1-5 bilaterally were debrided in length and girth with sterile nail nippers and dremel without incident. -Patient/POA to call should there be question/concern in the interim.   Return in about 3 months (around 12/20/2022).  Marzetta Board, DPM

## 2022-10-15 DIAGNOSIS — Z202 Contact with and (suspected) exposure to infections with a predominantly sexual mode of transmission: Secondary | ICD-10-CM | POA: Diagnosis not present

## 2022-10-15 DIAGNOSIS — Z6824 Body mass index (BMI) 24.0-24.9, adult: Secondary | ICD-10-CM | POA: Diagnosis not present

## 2022-10-15 DIAGNOSIS — R059 Cough, unspecified: Secondary | ICD-10-CM | POA: Diagnosis not present

## 2022-10-20 ENCOUNTER — Encounter: Payer: Self-pay | Admitting: Emergency Medicine

## 2022-10-20 ENCOUNTER — Ambulatory Visit (INDEPENDENT_AMBULATORY_CARE_PROVIDER_SITE_OTHER): Payer: Medicare Other | Admitting: Emergency Medicine

## 2022-10-20 VITALS — BP 132/62 | HR 88 | Temp 98.5°F | Ht 66.0 in | Wt 145.5 lb

## 2022-10-20 DIAGNOSIS — I152 Hypertension secondary to endocrine disorders: Secondary | ICD-10-CM

## 2022-10-20 DIAGNOSIS — E1159 Type 2 diabetes mellitus with other circulatory complications: Secondary | ICD-10-CM | POA: Diagnosis not present

## 2022-10-20 DIAGNOSIS — N401 Enlarged prostate with lower urinary tract symptoms: Secondary | ICD-10-CM | POA: Diagnosis not present

## 2022-10-20 DIAGNOSIS — E1169 Type 2 diabetes mellitus with other specified complication: Secondary | ICD-10-CM | POA: Diagnosis not present

## 2022-10-20 DIAGNOSIS — E785 Hyperlipidemia, unspecified: Secondary | ICD-10-CM | POA: Diagnosis not present

## 2022-10-20 DIAGNOSIS — J069 Acute upper respiratory infection, unspecified: Secondary | ICD-10-CM

## 2022-10-20 DIAGNOSIS — R634 Abnormal weight loss: Secondary | ICD-10-CM

## 2022-10-20 DIAGNOSIS — N138 Other obstructive and reflux uropathy: Secondary | ICD-10-CM

## 2022-10-20 DIAGNOSIS — N5201 Erectile dysfunction due to arterial insufficiency: Secondary | ICD-10-CM

## 2022-10-20 LAB — POCT GLYCOSYLATED HEMOGLOBIN (HGB A1C): Hemoglobin A1C: 6.1 % — AB (ref 4.0–5.6)

## 2022-10-20 MED ORDER — SILDENAFIL CITRATE 100 MG PO TABS: 50.0000 mg | ORAL_TABLET | Freq: Every day | ORAL | 11 refills | Status: DC | PRN

## 2022-10-20 NOTE — Progress Notes (Signed)
Tony Price 82 y.o.   Chief Complaint  Patient presents with   Follow-up    65mth f/u appt, patient having urinary symptoms, unsure if its prostate issues that is causing these urinary issues.     HISTORY OF PRESENT ILLNESS: This is a 82y.o. male here for 353-monthollow-up of chronic medical conditions. 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  On tamsulosin 0.5 mg daily.  Has seen urologist in the past several times. Also planing of erectile dysfunction.  Requesting medication. No other complaints or medical concerns today.  HPI   Prior to Admission medications   Medication Sig Start Date End Date Taking? Authorizing Provider  amLODipine (NORVASC) 10 MG tablet TAKE 1 TABLET BY MOUTH EVERY DAY 03/18/22  Yes Donnis Phaneuf, MiInes BloomerMD  Ascorbic Acid (VITAMIN C) 500 MG CAPS See admin instructions.   Yes [provider]  aspirin 81 MG EC tablet 1 tablet   Yes [provider]  atorvastatin (LIPITOR) 20 MG tablet TAKE 1 TABLET BY MOUTH EVERY DAY 08/04/22  Yes Odis Wickey, MiInes BloomerMD  blood glucose meter kit and supplies KIT 1 touch ultra meter.   Check blood sugar once daily E11.9 07/14/17  Yes GrWendie AgresteMD  Blood Glucose Monitoring Suppl (ONE TOUCH ULTRA SYSTEM KIT) w/Device KIT Test blood sugar once daily. Dx: E11.9 07/01/16  Yes GrWendie AgresteMD  carboxymethylcellulose (REFRESH PLUS) 0.5 % SOLN 1 drop 2 (two) times daily as needed. Into both eyes   Yes [provider]  glimepiride (AMARYL) 2 MG tablet TAKE 1 TABLET BY MOUTH TWICE A DAY 09/16/22  Yes Arline Ketter, MiInes BloomerMD  glucose blood test strip Test blood sugar once daily. Dx: E11.9 07/04/17  Yes GrWendie AgresteMD  Lancets ([facility]) lancets Test blood sugar once daily. Dx: E11.9 07/04/17  Yes GrWendie AgresteMD  latanoprost (XALATAN) 0.005 % ophthalmic solution 1 drop at bedtime. 08/31/19  Yes [provider]  memantine (NAMENDA) 5 MG  tablet TAKE 1 TABLET (5 MG AT NIGHT) FOR 2 WEEKS, THEN INCREASE TO 1 TABLET (5 MG) TWICE A DAY 06/10/22  Yes Wertman, Sara E, PA-C  neomycin-polymyxin b-dexamethasone (MAXITROL) 3.5-10000-0.1 OINT SMARTSIG:Topical 06/26/20  Yes [provider]  tamsulosin (FLOMAX) 0.4 MG CAPS capsule Take 0.4 mg by mouth.   Yes [provider]  triamcinolone cream (KENALOG) 0.1 % APPLY TO AFFECTED AREA TWICE A DAY 07/20/21  Yes Katricia Prehn, MiInes BloomerMD  valsartan (DIOVAN) 160 MG tablet Take 1 tablet (160 mg total) by mouth daily. 06/21/22  Yes SaHorald PollenMD    No Known Allergies  Patient Active Problem List   Diagnosis Date Noted   Weight loss 04/28/2022   Mild cognitive impairment of uncertain or unknown etiology 04/28/2022   Erectile dysfunction due to arterial insufficiency 09/14/2021   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   Personal history of colonic polyps 11/04/2020   Diabetic retinopathy ([facility]06/14/2017   Dyslipidemia associated with type 2 diabetes mellitus (HCCrest01/31/2016   Hypertension associated with diabetes ([facility]CG[place]01/31/2016    Past Medical History:  Diagnosis Date   Chest pain    Diabetes mellitus    Hypercholesterolemia    Hypertension    Kidney stones     Past Surgical History:  Procedure Laterality Date   CATARACT EXTRACTION     EYE SURGERY     PROSTATE BIOPSY     ROTATOR CUFF REPAIR  Right    Social History   Socioeconomic History   Marital status: Divorced    Spouse name: Not on file   Number of children: 3   Years of education: 12   Highest education level: Not on file  Occupational History   Not on file  Tobacco Use   Smoking status: Former    Years: 2.00    Types: Cigarettes   Smokeless tobacco: Never  Substance and Sexual Activity   Alcohol use: No    Alcohol/week: 0.0 standard drinks of alcohol   Drug use: No   Sexual activity: Not on file  Other Topics Concern   Not on file  Social History  Narrative   Right handed   No caffeine   No falls   One story home   Social Determinants of Health   Financial Resource Strain: Low Risk  (07/07/2022)   Overall Financial Resource Strain (CARDIA)    Difficulty of Paying Living Expenses: Not hard at all  Food Insecurity: No Food Insecurity (07/07/2022)   Hunger Vital Sign    Worried About Running Out of Food in the Last Year: Never true    Ran Out of Food in the Last Year: Never true  Transportation Needs: No Transportation Needs (07/07/2022)   PRAPARE - Hydrologist (Medical): No    Lack of Transportation (Non-Medical): No  Physical Activity: Sufficiently Active (07/07/2022)   Exercise Vital Sign    Days of Exercise per Week: 7 days    Minutes of Exercise per Session: 60 min  Stress: No Stress Concern Present (07/07/2022)   [place]    Feeling of Stress : Not at all  Social Connections: [place] (07/07/2022)   Social Connection and Isolation Panel [NHANES]    Frequency of Communication with Friends and Family: More than three times a week    Frequency of Social Gatherings with Friends and Family: More than three times a week    Attends Religious Services: More than 4 times per year    Active Member of [employer] or Organizations: Yes    Attends Music therapist: More than 4 times per year    Marital Status: Married  Human resources officer Violence: Not At Risk (07/07/2022)   Humiliation, Afraid, Rape, and Kick questionnaire    Fear of Current or Ex-Partner: No    Emotionally Abused: No    Physically Abused: No    Sexually Abused: No    Family History  Family history unknown: Yes     Review of Systems  Constitutional: Negative.  Negative for chills and fever.  HENT: Negative.  Negative for congestion and sore throat.   Respiratory:  Positive for cough.   Cardiovascular: Negative.  Negative for chest pain and  palpitations.  Gastrointestinal:  Negative for abdominal pain, diarrhea, nausea and vomiting.  Genitourinary:  Positive for frequency. Negative for hematuria.  Skin: Negative.  Negative for rash.  Neurological: Negative.  Negative for dizziness and headaches.  All other systems reviewed and are negative.  Today's Vitals   10/20/22 1338  BP: 132/62  Pulse: 88  Temp: 98.5 F (36.9 C)  TempSrc: Oral  SpO2: 98%  Weight: 145 lb 8 oz (66 kg)  Height: _0  (1.676 m)   Body mass index is 23.48 kg/m.   Physical Exam Vitals reviewed.  Constitutional:      Appearance: Normal appearance.  HENT:     Head: Normocephalic.  Mouth/Throat:     Mouth: Mucous membranes are moist.     Pharynx: Oropharynx is clear.  Eyes:     Extraocular Movements: Extraocular movements intact.     Pupils: Pupils are equal, round, and reactive to light.  Cardiovascular:     Rate and Rhythm: Normal rate and regular rhythm.     Pulses: Normal pulses.     Heart sounds: Normal heart sounds.  Pulmonary:     Effort: Pulmonary effort is normal.     Breath sounds: Normal breath sounds.  Abdominal:     Palpations: Abdomen is soft.     Tenderness: There is no abdominal tenderness.  Musculoskeletal:     Cervical back: No tenderness.  Lymphadenopathy:     Cervical: No cervical adenopathy.  Skin:    General: Skin is warm and dry.  Neurological:     General: No focal deficit present.     Mental Status: He is alert and oriented to person, place, and time.  Psychiatric:        Mood and Affect: Mood normal.        Behavior: Behavior normal.    Results for orders placed or performed in visit on 10/20/22 (from the past 24 hour(s))  POCT HgB A1C     Status: Abnormal   Collection Time: 10/20/22  2:57 PM  Result Value Ref Range   Hemoglobin A1C 6.1 (A) 4.0 - 5.6 %   HbA1c POC (<> result, manual entry)     HbA1c, POC (prediabetic range)     HbA1c, POC (controlled diabetic range)       ASSESSMENT &  PLAN: A total of 46 minutes was spent with the patient and counseling/coordination of care regarding preparing for this visit, review of most recent office visit notes, review of multiple chronic medical conditions and their management, review of all medications, cardiovascular risk associated with hypertension and diabetes, review of most recent blood work results including interpretation of today's hemoglobin A1c, education on nutrition, prognosis, documentation, and need for follow-up.  Problem List Items Addressed This Visit       Cardiovascular and Mediastinum   Hypertension associated with diabetes ([place])    Well-controlled hypertension. Continue valsartan 160 mg and amlodipine 10 mg daily. BP Readings from Last 3 Encounters:  10/20/22 132/62  09/20/22 120/60  08/24/22 127/65  Well-controlled diabetes with hemoglobin A1c of 6.1. Continue glimepiride 2 mg twice a day Cardiovascular risk associated with hypertension and diabetes discussed. Diet and nutrition discussed.       Relevant Medications   sildenafil (VIAGRA) 100 MG tablet   Other Relevant Orders   POCT HgB A1C   Erectile dysfunction due to arterial insufficiency - Primary    Requesting medication. Prescription for Viagra sent to pharmacy of record.      Relevant Medications   sildenafil (VIAGRA) 100 MG tablet     Respiratory   Acute upper respiratory infection    Running its course without complications. Still has residual cough. No red flag signs or symptoms. Continue over-the-counter Coricidin HBP        Endocrine   Dyslipidemia associated with type 2 diabetes mellitus ([place])    Stable.  Diet and nutrition discussed. Continue atorvastatin 20 mg daily and daily baby aspirin        Genitourinary   BPH with obstruction/lower urinary tract symptoms    Well-controlled on Flomax 0.4 mg at bedtime        Other   Weight loss    Wt  Readings from Last 3 Encounters:  10/20/22 145 lb 8 oz (66 kg)  08/24/22  144 lb (65.3 kg)  07/29/22 145 lb (65.8 kg)  Stable.  Good appetite.  No concerns. Negative for malignancy workup      Other Visit Diagnoses     Viral upper respiratory tract infection          Patient Instructions  Health Maintenance After Age 32 After age 93, you are at a higher risk for certain long-term diseases and infections as well as injuries from falls. Falls are a major cause of broken bones and head injuries in people who are older than age 23. Getting regular preventive care can help to keep you healthy and well. Preventive care includes getting regular testing and making lifestyle changes as recommended by your health care provider. Talk with your health care provider about: Which screenings and tests you should have. A screening is a test that checks for a disease when you have no symptoms. A diet and exercise plan that is right for you. What should I know about screenings and tests to prevent falls? Screening and testing are the best ways to find a health problem early. Early diagnosis and treatment give you the best chance of managing medical conditions that are common after age 60. Certain conditions and lifestyle choices may make you more likely to have a fall. Your health care provider may recommend: Regular vision checks. Poor vision and conditions such as cataracts can make you more likely to have a fall. If you wear glasses, make sure to get your prescription updated if your vision changes. Medicine review. Work with your health care provider to regularly review all of the medicines you are taking, including over-the-counter medicines. Ask your health care provider about any side effects that may make you more likely to have a fall. Tell your health care provider if any medicines that you take make you feel dizzy or sleepy. Strength and balance checks. Your health care provider may recommend certain tests to check your strength and balance while standing, walking, or  changing positions. Foot health exam. Foot pain and numbness, as well as not wearing proper footwear, can make you more likely to have a fall. Screenings, including: Osteoporosis screening. Osteoporosis is a condition that causes the bones to get weaker and break more easily. Blood pressure screening. Blood pressure changes and medicines to control blood pressure can make you feel dizzy. Depression screening. You may be more likely to have a fall if you have a fear of falling, feel depressed, or feel unable to do activities that you used to do. Alcohol use screening. Using too much alcohol can affect your balance and may make you more likely to have a fall. Follow these instructions at home: Lifestyle Do not drink alcohol if: Your health care provider tells you not to drink. If you drink alcohol: Limit how much you have to: 0-1 drink a day for women. 0-2 drinks a day for men. Know how much alcohol is in your drink. In the [place] one drink equals one 12 oz bottle of beer (355 mL), one 5 oz glass of wine (148 mL), or one 1 oz glass of hard liquor (44 mL). Do not use any products that contain nicotine or tobacco. These products include cigarettes, chewing tobacco, and vaping devices, such as e-cigarettes. If you need help quitting, ask your health care provider. Activity  Follow a regular exercise program to stay fit. This will help you maintain your  balance. Ask your health care provider what types of exercise are appropriate for you. If you need a cane or walker, use it as recommended by your health care provider. Wear supportive shoes that have nonskid soles. Safety  Remove any tripping hazards, such as rugs, cords, and clutter. Install safety equipment such as grab bars in bathrooms and safety rails on stairs. Keep rooms and walkways well-lit. General instructions Talk with your health care provider about your risks for falling. Tell your health care provider if: You fall. Be sure to  tell your health care provider about all falls, even ones that seem minor. You feel dizzy, tiredness (fatigue), or off-balance. Take over-the-counter and prescription medicines only as told by your health care provider. These include supplements. Eat a healthy diet and maintain a healthy weight. A healthy diet includes low-fat dairy products, low-fat (lean) meats, and fiber from whole grains, beans, and lots of fruits and vegetables. Stay current with your vaccines. Schedule regular health, dental, and eye exams. Summary Having a healthy lifestyle and getting preventive care can help to protect your health and wellness after age 33. Screening and testing are the best way to find a health problem early and help you avoid having a fall. Early diagnosis and treatment give you the best chance for managing medical conditions that are more common for people who are older than age 77. Falls are a major cause of broken bones and head injuries in people who are older than age 3. Take precautions to prevent a fall at home. Work with your health care provider to learn what changes you can make to improve your health and wellness and to prevent falls. This information is not intended to replace advice given to you by your health care provider. Make sure you discuss any questions you have with your health care provider. Document Revised: 02/23/2021 Document Reviewed: 02/23/2021 Elsevier Patient Education  [place], MD [facility]

## 2022-10-20 NOTE — Patient Instructions (Signed)
Health Maintenance After Age 82 After age 82, you are at a higher risk for certain long-term diseases and infections as well as injuries from falls. Falls are a major cause of broken bones and head injuries in people who are older than age 82. Getting regular preventive care can help to keep you healthy and well. Preventive care includes getting regular testing and making lifestyle changes as recommended by your health care provider. Talk with your health care provider about: Which screenings and tests you should have. A screening is a test that checks for a disease when you have no symptoms. A diet and exercise plan that is right for you. What should I know about screenings and tests to prevent falls? Screening and testing are the best ways to find a health problem early. Early diagnosis and treatment give you the best chance of managing medical conditions that are common after age 82. Certain conditions and lifestyle choices may make you more likely to have a fall. Your health care provider may recommend: Regular vision checks. Poor vision and conditions such as cataracts can make you more likely to have a fall. If you wear glasses, make sure to get your prescription updated if your vision changes. Medicine review. Work with your health care provider to regularly review all of the medicines you are taking, including over-the-counter medicines. Ask your health care provider about any side effects that may make you more likely to have a fall. Tell your health care provider if any medicines that you take make you feel dizzy or sleepy. Strength and balance checks. Your health care provider may recommend certain tests to check your strength and balance while standing, walking, or changing positions. Foot health exam. Foot pain and numbness, as well as not wearing proper footwear, can make you more likely to have a fall. Screenings, including: Osteoporosis screening. Osteoporosis is a condition that causes  the bones to get weaker and break more easily. Blood pressure screening. Blood pressure changes and medicines to control blood pressure can make you feel dizzy. Depression screening. You may be more likely to have a fall if you have a fear of falling, feel depressed, or feel unable to do activities that you used to do. Alcohol use screening. Using too much alcohol can affect your balance and may make you more likely to have a fall. Follow these instructions at home: Lifestyle Do not drink alcohol if: Your health care provider tells you not to drink. If you drink alcohol: Limit how much you have to: 0-1 drink a day for women. 0-2 drinks a day for men. Know how much alcohol is in your drink. In the [place] one drink equals one 12 oz bottle of beer (355 mL), one 5 oz glass of wine (148 mL), or one 1 oz glass of hard liquor (44 mL). Do not use any products that contain nicotine or tobacco. These products include cigarettes, chewing tobacco, and vaping devices, such as e-cigarettes. If you need help quitting, ask your health care provider. Activity  Follow a regular exercise program to stay fit. This will help you maintain your balance. Ask your health care provider what types of exercise are appropriate for you. If you need a cane or walker, use it as recommended by your health care provider. Wear supportive shoes that have nonskid soles. Safety  Remove any tripping hazards, such as rugs, cords, and clutter. Install safety equipment such as grab bars in bathrooms and safety rails on stairs. Keep rooms and walkways   well-lit. General instructions Talk with your health care provider about your risks for falling. Tell your health care provider if: You fall. Be sure to tell your health care provider about all falls, even ones that seem minor. You feel dizzy, tiredness (fatigue), or off-balance. Take over-the-counter and prescription medicines only as told by your health care provider. These include  supplements. Eat a healthy diet and maintain a healthy weight. A healthy diet includes low-fat dairy products, low-fat (lean) meats, and fiber from whole grains, beans, and lots of fruits and vegetables. Stay current with your vaccines. Schedule regular health, dental, and eye exams. Summary Having a healthy lifestyle and getting preventive care can help to protect your health and wellness after age 82. Screening and testing are the best way to find a health problem early and help you avoid having a fall. Early diagnosis and treatment give you the best chance for managing medical conditions that are more common for people who are older than age 82. Falls are a major cause of broken bones and head injuries in people who are older than age 82. Take precautions to prevent a fall at home. Work with your health care provider to learn what changes you can make to improve your health and wellness and to prevent falls. This information is not intended to replace advice given to you by your health care provider. Make sure you discuss any questions you have with your health care provider. Document Revised: 02/23/2021 Document Reviewed: 02/23/2021 Elsevier Patient Education  2023 Elsevier Inc.  

## 2022-10-20 NOTE — Assessment & Plan Note (Signed)
Stable.  Diet and nutrition discussed. Continue atorvastatin 20 mg daily and daily baby aspirin

## 2022-10-20 NOTE — Assessment & Plan Note (Signed)
Wt Readings from Last 3 Encounters:  10/20/22 145 lb 8 oz (66 kg)  08/24/22 144 lb (65.3 kg)  07/29/22 145 lb (65.8 kg)   Stable.  Good appetite.  No concerns. Negative for malignancy workup

## 2022-10-20 NOTE — Assessment & Plan Note (Signed)
Requesting medication. Prescription for Viagra sent to pharmacy of record.

## 2022-10-20 NOTE — Assessment & Plan Note (Signed)
Running its course without complications. Still has residual cough. No red flag signs or symptoms. Continue over-the-counter Coricidin HBP

## 2022-10-20 NOTE — Assessment & Plan Note (Signed)
Well-controlled hypertension. Continue valsartan 160 mg and amlodipine 10 mg daily. BP Readings from Last 3 Encounters:  10/20/22 132/62  09/20/22 120/60  08/24/22 127/65  Well-controlled diabetes with hemoglobin A1c of 6.1. Continue glimepiride 2 mg twice a day Cardiovascular risk associated with hypertension and diabetes discussed. Diet and nutrition discussed.

## 2022-10-20 NOTE — Assessment & Plan Note (Signed)
Well-controlled on Flomax 0.4 mg at bedtime

## 2022-11-04 ENCOUNTER — Ambulatory Visit: Payer: Medicare Other | Admitting: Emergency Medicine

## 2022-11-22 DIAGNOSIS — R972 Elevated prostate specific antigen [PSA]: Secondary | ICD-10-CM | POA: Diagnosis not present

## 2022-11-29 DIAGNOSIS — N401 Enlarged prostate with lower urinary tract symptoms: Secondary | ICD-10-CM | POA: Diagnosis not present

## 2022-11-29 DIAGNOSIS — R351 Nocturia: Secondary | ICD-10-CM | POA: Diagnosis not present

## 2022-11-29 DIAGNOSIS — R972 Elevated prostate specific antigen [PSA]: Secondary | ICD-10-CM | POA: Diagnosis not present

## 2022-12-01 DIAGNOSIS — H402231 Chronic angle-closure glaucoma, bilateral, mild stage: Secondary | ICD-10-CM | POA: Diagnosis not present

## 2022-12-01 LAB — HM DIABETES EYE EXAM

## 2023-01-05 ENCOUNTER — Encounter: Payer: Self-pay | Admitting: Podiatry

## 2023-01-05 ENCOUNTER — Ambulatory Visit (INDEPENDENT_AMBULATORY_CARE_PROVIDER_SITE_OTHER): Payer: Medicare Other | Admitting: Podiatry

## 2023-01-05 VITALS — BP 117/64

## 2023-01-05 DIAGNOSIS — M79674 Pain in right toe(s): Secondary | ICD-10-CM | POA: Diagnosis not present

## 2023-01-05 DIAGNOSIS — M79675 Pain in left toe(s): Secondary | ICD-10-CM

## 2023-01-05 DIAGNOSIS — M2012 Hallux valgus (acquired), left foot: Secondary | ICD-10-CM | POA: Diagnosis not present

## 2023-01-05 DIAGNOSIS — M2011 Hallux valgus (acquired), right foot: Secondary | ICD-10-CM

## 2023-01-05 DIAGNOSIS — E1142 Type 2 diabetes mellitus with diabetic polyneuropathy: Secondary | ICD-10-CM | POA: Diagnosis not present

## 2023-01-05 DIAGNOSIS — B351 Tinea unguium: Secondary | ICD-10-CM | POA: Diagnosis not present

## 2023-01-05 DIAGNOSIS — E119 Type 2 diabetes mellitus without complications: Secondary | ICD-10-CM

## 2023-01-05 NOTE — Progress Notes (Unsigned)
ANNUAL DIABETIC FOOT EXAM  Subjective: Tony Price presents today {jgcomplaint:23593}.  Chief Complaint  Patient presents with   Nail Problem    Prediabetic BS- forgot what his reading was A1C-do not know PCP-[facility] PCP VST-Couple months ago    Patient confirms h/o diabetes.  Patient relates {Numbers; 0-100:15068} year h/o diabetes.  Patient denies any h/o foot wounds.  Patient has h/o foot ulcer of {jgPodToeLocator:23637}, which healed via help of ***.  Patient has h/o amputation(s): {jgamp:23617}.  Patient endorses symptoms of foot numbness.   Patient endorses symptoms of foot tingling.  Patient endorses symptoms of burning in feet.  Patient endorses symptoms of pins/needles sensation in feet.  Patient denies any numbness, tingling, burning, or pins/needle sensation in feet.  Patient has been diagnosed with neuropathy.  Risk factors: {jgriskfactors:24044}.  Horald Pollen, MD is patient's PCP.  Past Medical History:  Diagnosis Date   Chest pain    Diabetes mellitus    Hypercholesterolemia    Hypertension    Kidney stones    Patient Active Problem List   Diagnosis Date Noted   Weight loss 04/28/2022   Mild cognitive impairment of uncertain or unknown etiology 04/28/2022   Erectile dysfunction due to arterial insufficiency 09/14/2021   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   Personal history of colonic polyps 11/04/2020   Acute upper respiratory infection 10/21/2017   Diabetic retinopathy ([place]) 03/31/2016   Dyslipidemia associated with type 2 diabetes mellitus ([place]) 11/17/2014   Hypertension associated with diabetes ([place]) 11/17/2014   Past Surgical History:  Procedure Laterality Date   CATARACT EXTRACTION     EYE SURGERY     PROSTATE BIOPSY     ROTATOR CUFF REPAIR     Right   Current Outpatient Medications on File Prior to Visit  Medication Sig Dispense Refill   amLODipine (NORVASC) 10 MG tablet TAKE 1 TABLET  BY MOUTH EVERY DAY 90 tablet 1   Ascorbic Acid (VITAMIN C) 500 MG CAPS See admin instructions.     aspirin 81 MG EC tablet 1 tablet     atorvastatin (LIPITOR) 20 MG tablet TAKE 1 TABLET BY MOUTH EVERY DAY 90 tablet 1   blood glucose meter kit and supplies KIT 1 touch ultra meter.   Check blood sugar once daily E11.9 1 each 0   Blood Glucose Monitoring Suppl (ONE TOUCH ULTRA SYSTEM KIT) w/Device KIT Test blood sugar once daily. Dx: E11.9 1 each 0   carboxymethylcellulose (REFRESH PLUS) 0.5 % SOLN 1 drop 2 (two) times daily as needed. Into both eyes     glimepiride (AMARYL) 2 MG tablet TAKE 1 TABLET BY MOUTH TWICE A DAY 180 tablet 1   glucose blood test strip Test blood sugar once daily. Dx: E11.9 100 each 3   Lancets (ONETOUCH ULTRASOFT) lancets Test blood sugar once daily. Dx: E11.9 100 each 3   latanoprost (XALATAN) 0.005 % ophthalmic solution 1 drop at bedtime.     memantine (NAMENDA) 5 MG tablet TAKE 1 TABLET (5 MG AT NIGHT) FOR 2 WEEKS, THEN INCREASE TO 1 TABLET (5 MG) TWICE A DAY 180 tablet 4   neomycin-polymyxin b-dexamethasone (MAXITROL) 3.5-10000-0.1 OINT SMARTSIG:Topical     sildenafil (VIAGRA) 100 MG tablet Take 0.5-1 tablets (50-100 mg total) by mouth daily as needed for erectile dysfunction. 5 tablet 11   tamsulosin (FLOMAX) 0.4 MG CAPS capsule Take 0.4 mg by mouth.     triamcinolone cream (KENALOG) 0.1 % APPLY TO AFFECTED AREA TWICE A DAY 30 g  0   valsartan (DIOVAN) 160 MG tablet Take 1 tablet (160 mg total) by mouth daily. 90 tablet 3   No current facility-administered medications on file prior to visit.    No Known Allergies Social History   Occupational History   Not on file  Tobacco Use   Smoking status: Former    Years: 2    Types: Cigarettes   Smokeless tobacco: Never  Substance and Sexual Activity   Alcohol use: No    Alcohol/week: 0.0 standard drinks of alcohol   Drug use: No   Sexual activity: Not on file   Family History  Family history unknown: Yes    Immunization History  Administered Date(s) Administered   Fluad Quad(high Dose 65+) 07/06/2021, 07/29/2022   PFIZER(Purple Top)SARS-COV-2 Vaccination 12/03/2019, 12/29/2019, 08/02/2020   Pneumococcal Polysaccharide-23 02/21/2020   Tdap 10/18/2010     Review of Systems: Negative except as noted in the HPI.   Objective: There were no vitals filed for this visit.  Tony Price is a pleasant 82 y.o. male in NAD. AAO X 3.  Vascular Examination: {jgvascular:23595}  Dermatological Examination: {jgderm:23598}  Neurological Examination: {jgneuro:23601::"Protective sensation intact 5/5 intact bilaterally with 10g monofilament b/l.","Vibratory sensation intact b/l.","Proprioception intact bilaterally."}  Musculoskeletal Examination: {jgmsk:23600}  Footwear Assessment: Does the patient wear appropriate shoes? {Yes,No}. Does the patient need inserts/orthotics? {Yes,No}.  Lab Results  Component Value Date   HGBA1C 6.1 (A) 10/20/2022   No results found. ADA Risk Categorization: Low Risk :  Patient has all of the following: Intact protective sensation No prior foot ulcer  No severe deformity Pedal pulses present  High Risk  Patient has one or more of the following: Loss of protective sensation Absent pedal pulses Severe Foot deformity History of foot ulcer  Assessment: 1. Pain due to onychomycosis of toenails of both feet   2. Hallux valgus, acquired, bilateral   3. Diabetic peripheral neuropathy associated with type 2 diabetes mellitus ([place])   4. Encounter for diabetic foot exam ([place])      Plan: No orders of the defined types were placed in this encounter.   No orders of the defined types were placed in this encounter.   None  {jgplan:23602::"-Patient/POA to call should there be question/concern in the interim."} Return in about 3 months (around 04/07/2023).  Marzetta Board, DPM

## 2023-01-07 ENCOUNTER — Other Ambulatory Visit: Payer: Self-pay | Admitting: Emergency Medicine

## 2023-01-07 DIAGNOSIS — N401 Enlarged prostate with lower urinary tract symptoms: Secondary | ICD-10-CM

## 2023-01-27 ENCOUNTER — Other Ambulatory Visit: Payer: Self-pay | Admitting: Emergency Medicine

## 2023-01-27 DIAGNOSIS — E785 Hyperlipidemia, unspecified: Secondary | ICD-10-CM

## 2023-02-03 ENCOUNTER — Encounter: Payer: Self-pay | Admitting: Psychology

## 2023-02-03 ENCOUNTER — Ambulatory Visit (INDEPENDENT_AMBULATORY_CARE_PROVIDER_SITE_OTHER): Payer: Medicare Other | Admitting: Psychology

## 2023-02-03 ENCOUNTER — Ambulatory Visit: Payer: Medicare Other | Admitting: Psychology

## 2023-02-03 DIAGNOSIS — G309 Alzheimer's disease, unspecified: Secondary | ICD-10-CM | POA: Diagnosis not present

## 2023-02-03 DIAGNOSIS — F0670 Mild neurocognitive disorder due to known physiological condition without behavioral disturbance: Secondary | ICD-10-CM | POA: Diagnosis not present

## 2023-02-03 DIAGNOSIS — R4189 Other symptoms and signs involving cognitive functions and awareness: Secondary | ICD-10-CM

## 2023-02-03 DIAGNOSIS — E119 Type 2 diabetes mellitus without complications: Secondary | ICD-10-CM | POA: Insufficient documentation

## 2023-02-03 DIAGNOSIS — I679 Cerebrovascular disease, unspecified: Secondary | ICD-10-CM | POA: Diagnosis not present

## 2023-02-03 DIAGNOSIS — E7800 Pure hypercholesterolemia, unspecified: Secondary | ICD-10-CM | POA: Insufficient documentation

## 2023-02-03 NOTE — Progress Notes (Signed)
   Psychometrician Note   Cognitive testing was administered to Tony Price by Wallace Keller, B.S. (psychometrist) under the supervision of Dr. Newman Nickels, Ph.D., licensed psychologist on 02/03/2023. Tony Price did not appear overtly distressed by the testing session per behavioral observation or responses across self-report questionnaires. Rest breaks were offered.    The battery of tests administered was selected by Dr. Newman Nickels, Ph.D. with consideration to Tony Price current level of functioning, the nature of his symptoms, emotional and behavioral responses during interview, level of literacy, observed level of motivation/effort, and the nature of the referral question. This battery was communicated to the psychometrist. Communication between Dr. Newman Nickels, Ph.D. and the psychometrist was ongoing throughout the evaluation and Dr. Newman Nickels, Ph.D. was immediately accessible at all times. Dr. Newman Nickels, Ph.D. provided supervision to the psychometrist on the date of this service to the extent necessary to assure the quality of all services provided.    Tony Price will return within approximately 1-2 weeks for an interactive feedback session with Dr. Milbert Coulter at which time his test performances, clinical impressions, and treatment recommendations will be reviewed in detail. Tony Price understands he can contact our office should he require our assistance before this time.  A total of 125 minutes of billable time were spent face-to-face with Tony Price by the psychometrist. This includes both test administration and scoring time. Billing for these services is reflected in the clinical report generated by Dr. Newman Nickels, Ph.D.  This note reflects time spent with the psychometrician and does not include test scores or any clinical interpretations made by Dr. Milbert Coulter. The full report will follow in a separate note.

## 2023-02-03 NOTE — Progress Notes (Signed)
NEUROPSYCHOLOGICAL EVALUATION [place]. [facility] Department of Neurology  Date of Evaluation: February 03, 2023  Reason for Referral:   Tony Price is a 82 y.o. right-handed African-American male referred by Marlowe Kays, PA-C, to characterize his current cognitive functioning and assist with diagnostic clarity and treatment planning in the context of subjective cognitive decline.   Assessment and Plan:   Clinical Impression(s): Tony Price pattern of performance is suggestive of severe impairment surrounding all aspects of learning and memory. Additional impairments were exhibited across executive functioning and semantic fluency. Performance variability was further exhibited across basic attention, confrontation naming, and visuospatial abilities. Performances were generally appropriate relative to age-matched peers across processing speed, complex attention, safety/judgment, receptive language, and phonemic fluency.   Functionally, Tony Price denied any difficulties completing instrumental activities of daily living (ADLs) independently. His significant other was in full agreement with this. A lack of ADL dysfunction has also been consistently reported throughout Tony Price medical records. This is admittedly somewhat surprising to me given the severity of ongoing objective cognitive impairment across testing. While current impairment would arise to dementia level severity, his lack of functional impairment prevents him from formally meeting diagnostic criteria. 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 However, he is certainly towards the severe end of this spectrum and at risk for a transition to a dementia designation over the next few years.   Regarding etiology, I have concerns for the presence of an underlying neurodegenerative illness, namely Alzheimer's disease. Across memory testing, he did not  benefit from repeated exposure to information across learning trials, was fully amnestic (i.e., 0% retention) across all memory tasks after a brief delay, and performed poorly across yes/no recognition trials. 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 Impairments and/or weakness surrounding semantic fluency and confrontation naming would follow typical disease progression. There could be a vascular contribution given recent neuroimaging suggesting moderate microvascular ischemic disease. However, this would not explain amnestic memory and other patterns of performance in isolation. Overall, underlying Alzheimer's disease, potentially made worse by his cerebrovascular health, appears the most likely cause for ongoing memory impairment based upon currently available data. Continued medical monitoring will be important moving forward.   Recommendations: Should Tony Price experience day-to-day functional decline in the future and there arise a need for testing to formally document a transition to a dementia designation, a repeat neuropsychological evaluation would be warranted at that time.  Given concerns for hearing loss and the potential to impact memory and other cognitive abilities, it would be prudent for Tony Price to have a formal audiologic evaluation. He should discuss this referral with his PCP.   Tony Price has already been prescribed a medication aimed to address memory loss and concerns surrounding Alzheimer's disease (i.e., memantine/Namenda). He is encouraged to continue taking this medication as prescribed. It is important to highlight that this medication has been shown to slow functional decline in some individuals. There is no current treatment which can stop or reverse cognitive decline when caused by a neurodegenerative illness.   Performance across neurocognitive testing is not  a strong predictor of an individual's safety operating a motor vehicle. Should his family wish to pursue a formalized driving evaluation, they could reach out to the following agencies: [employer] in [place]: [phone] [facility]: [phone] [facility]: [phone] [facility]: [phone] or  [phone]  Should there be progression of current deficits over time, Tony Price is unlikely to regain any independent living skills lost. Therefore, it is recommended that he remain as involved as possible in all aspects of household chores, finances, and medication management, with supervision to ensure adequate performance. He will likely benefit from the establishment and maintenance of a routine in order to maximize his functional abilities over time.  It will be important for Tony Price to have another person with him when in situations where he may need to process information, weigh the pros and cons of different options, and make decisions, in order to ensure that he fully understands and recalls all information to be considered.  If not already done, Tony Price and his family may want to discuss his wishes regarding durable power of attorney and medical decision making, so that he can have input into these choices. If they require legal assistance with this, long-term care resource access, or other aspects of estate planning, they could reach out to [employer] at [phone] for a free consultation. Additionally, they may wish to discuss future plans for caretaking and seek out community options for in home/residential care should they become necessary.  Tony Price is encouraged to attend to lifestyle factors for brain health (e.g., regular physical exercise, good nutrition habits and consideration of the MIND-DASH diet, regular participation in cognitively-stimulating activities, and general stress management techniques), which  are likely to have benefits for both emotional adjustment and cognition. Optimal control of vascular risk factors (including safe cardiovascular exercise and adherence to dietary recommendations) is encouraged. Continued participation in activities which provide mental stimulation and social interaction is also recommended.   Important information should be provided to Tony Price in written format in all instances. This information should be placed in a highly frequented and easily visible location within his home to promote recall. External strategies such as written notes in a consistently used memory journal, visual and nonverbal auditory cues such as a calendar on the refrigerator or appointments with alarm, such as on a cell phone, can also help maximize recall.  Review of Records:   Tony Price was seen by [facility] Marlowe Kays, PA-C) on 05/17/2022 for an evaluation of memory loss. At that time, short-term memory decline was said to be ongoing for the past two years. Primary examples included Tony Price seeming to forget information rapidly and be repetitive in conversation. ADLs were described as intact. His total score across a brief cognitive screening task (MOCA blind) was not reported. Records suggest a 0/5 delayed recall score, but also note that prominent inattention was exhibited during administration.  Tony Price was most recently seen by Ms. Wertman on 08/24/2022 for follow-up. He had been started on memantine in the interim and was tolerating this medication well. Cognition was said to be stable and no ADL dysfunction was noted. Performance on a brief cognitive screening instrument (MMSE) was 24/30. Ultimately, Tony Price was referred for a comprehensive neuropsychological evaluation to characterize his cognitive abilities and to assist with diagnostic clarity and treatment planning.  Brain MRI on 06/01/2022 revealed mild generalized age-related volume loss and moderate  microvascular ischemic disease.   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   History of colonic polyps 11/04/2020   Hypercholesterolemia    Hypertension associated with diabetes 11/17/2014   Kidney stones    Mild cognitive impairment of uncertain or unknown etiology 04/28/2022   Type II diabetes mellitus 02/03/2023    Past Surgical History:  Procedure Laterality Date   CATARACT EXTRACTION     EYE SURGERY     PROSTATE BIOPSY     ROTATOR CUFF REPAIR     Right    Current Outpatient Medications:    amLODipine (NORVASC) 10 MG tablet, TAKE 1 TABLET BY MOUTH EVERY DAY, Disp: 90 tablet, Rfl: 1   Ascorbic Acid (VITAMIN C) 500 MG CAPS, See admin instructions., Disp: , Rfl:    aspirin 81 MG EC tablet, 1 tablet, Disp: , Rfl:    atorvastatin (LIPITOR) 20 MG tablet, TAKE 1 TABLET BY MOUTH EVERY DAY, Disp: 90 tablet, Rfl: 1   blood glucose meter kit and supplies KIT, 1 touch ultra meter.   Check blood sugar once daily E11.9, Disp: 1 each, Rfl: 0   Blood Glucose Monitoring Suppl (ONE TOUCH ULTRA SYSTEM KIT) w/Device KIT, Test blood sugar once daily. Dx: E11.9, Disp: 1 each, Rfl: 0   carboxymethylcellulose (REFRESH PLUS) 0.5 % SOLN, 1 drop 2 (two) times daily as needed. Into both eyes, Disp: , Rfl:    glimepiride (AMARYL) 2 MG tablet, TAKE 1 TABLET BY MOUTH TWICE A DAY, Disp: 180 tablet, Rfl: 1   glucose blood test strip, Test blood sugar once daily. Dx: E11.9, Disp: 100 each, Rfl: 3   Lancets (ONETOUCH ULTRASOFT) lancets, Test blood sugar once daily. Dx: E11.9, Disp: 100 each, Rfl: 3   latanoprost (XALATAN) 0.005 % ophthalmic solution, 1 drop at bedtime., Disp: ,  Rfl:    memantine (NAMENDA) 5 MG tablet, TAKE 1 TABLET (5 MG AT NIGHT) FOR 2 WEEKS, THEN INCREASE TO 1 TABLET (5 MG) TWICE A DAY, Disp: 180 tablet, Rfl: 4   neomycin-polymyxin b-dexamethasone (MAXITROL) 3.5-10000-0.1 OINT, SMARTSIG:Topical, Disp: , Rfl:    sildenafil (VIAGRA) 100 MG tablet, Take 0.5-1 tablets (50-100 mg total) by mouth daily as needed for erectile dysfunction., Disp: 5 tablet, Rfl: 11   tamsulosin (FLOMAX) 0.4 MG CAPS capsule, Take 1 capsule (0.4 mg total) by mouth daily., Disp: 90 capsule, Rfl: 3   triamcinolone cream (KENALOG) 0.1 %, APPLY TO AFFECTED AREA TWICE A DAY, Disp: 30 g, Rfl: 0   valsartan (DIOVAN) 160 MG tablet, Take 1 tablet (160 mg total) by mouth daily., Disp: 90 tablet, Rfl: 3  Clinical Interview:   The following information was obtained during a clinical interview with Mr. Nill and his significant other prior to cognitive testing.  Cognitive Symptoms: Decreased short-term memory: Endorsed. He described his memory as "not as good as it used to be" and, with direct questioning, did acknowledge trouble recalling details of recent conversations and being more repetitive in conversation. He reported cognitive dysfunction present only during the past several months. His significant other reported that Mr. Pimenta is very repetitive in conversation and seems to rapidly forget new information presented to him. She noted that difficulties have been present for the past 7-8 months and do seem to be gradually worsening over time. Of note, medical records in July 2023 suggested that memory decline had been present for the past two years at that time.  Decreased long-term memory: Denied. Decreased attention/concentration: Denied. Reduced processing speed: Endorsed. Difficulties with executive functions: Denied. He also denied trouble with impulsivity  or any known personality changes. His significant other noted that he has seemed more easily angered or more quickly  frustrated lately.  Difficulties with emotion regulation: Denied. Difficulties with receptive language: Denied. Difficulties with word finding: Denied. Decreased visuoperceptual ability: Denied.  Difficulties completing ADLs: Denied. His significant other was in agreement with this.   Additional Medical History: History of traumatic brain injury/concussion: Denied. History of stroke: Denied. History of seizure activity: Denied. History of known exposure to toxins: Denied. Symptoms of chronic pain: Denied. Experience of frequent headaches/migraines: Denied. Frequent instances of dizziness/vertigo: Denied.  Sensory changes: He wears reading glasses with benefit. His significant other reported ongoing hearing loss but not to the extent that Mr. Staples has been prescribed hearing aids. Other sensory changes/difficulties (e.g., taste or smell) were denied.  Balance/coordination difficulties: Denied. He also denied any recent falls.  Other motor difficulties: Denied.  Sleep History: Estimated hours obtained each night: 8 hours.  Difficulties falling asleep: Denied. Difficulties staying asleep: Denied. Feels rested and refreshed upon awakening: Endorsed.  History of snoring: Infrequently. History of waking up gasping for air: Denied. Witnessed breath cessation while asleep: Denied.  History of vivid dreaming: Denied. Excessive movement while asleep: Denied. Instances of acting out his dreams: Denied.  Psychiatric/Behavioral Health History: Depression: He described his current mood as "good" and denied to his knowledge any mental health concerns or formal diagnoses. Current or remote suicidal ideation, intent, or plan was denied.  Anxiety: Denied. Mania: Denied. Trauma History: Denied. Visual/auditory hallucinations: Denied. Delusional thoughts: Denied.  Tobacco: Denied. Alcohol: He denied current alcohol consumption as well as a history of problematic alcohol abuse or  dependence.  Recreational drugs: Denied.  Family History: Problem Relation Age of Onset   Dementia Cousin        maternal; x2   This information was confirmed by Mr. Nylen.  Academic/Vocational History: Highest level of educational attainment: 12 years. He graduated from high school and described himself as an average (A/B/C) student in academic settings. No relative weaknesses were identified.  History of developmental delay: Denied. History of grade repetition: Denied. Enrollment in special education courses: Denied. History of LD/ADHD: Denied.  Employment: Retired. 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 Heilman was accompanied by his significant other, arrived to his appointment on time, and was appropriately dressed and groomed. He appeared alert. Observed gait and station were within normal limits. Gross motor functioning appeared intact upon informal observation and no abnormal movements (e.g., tremors) were noted. His affect was generally relaxed and positive. Spontaneous speech was fluent and word finding difficulties were not observed during the clinical interview. Thought processes were coherent, organized, and normal in content. Insight into his cognitive difficulties appeared limited in that objective cognitive impairment was far more significant and diffuse relative to what he described or appeared able to appreciate during interview.   During testing, he commonly repeated instructions immediately after they were explained to him, assumedly as a way decrease the chances of him forgetting said instructions or commands. Sustained attention was appropriate. Task engagement was adequate and he persisted when challenged. He had trouble comprehending more complex task instructions. He also had significant trouble comprehending mood-related questionnaires, requiring  extensive explanations for each item. Overall, Mr. Apo was cooperative with the clinical interview and subsequent testing procedures.   Adequacy of Effort: The validity of neuropsychological testing is limited by the extent to which the  individual being tested may be assumed to have exerted adequate effort during testing. Mr. Trickett expressed his intention to perform to the best of his abilities and exhibited adequate task engagement and persistence. Scores across stand-alone and embedded performance validity measures were variable but largely within expectation. His sole below expectation performance is believed to be due to true severe memory impairment rather than poor engagement or attempts to perform poorly. As such, the results of the current evaluation are believed to be a valid representation of Mr. Cline current cognitive functioning.  Test Results: Mr. Okray was fully oriented at the time of the current evaluation.  Intellectual abilities based upon educational and vocational attainment were estimated to be in the average range. Premorbid abilities were estimated to be within the below average range based upon a single-word reading test.   Processing speed was largely average. However, he did perform in the well below average range across a rapid symbol decoding task (RBANS Coding). Basic attention was variable, ranging from the well below average to average normative ranges. More complex attention (e.g., working memory) was below average to average. Executive functioning was exceptionally low to well below average. He did perform in the above average range across a task assessing safety and judgment.  Assessed receptive language abilities were average. Assessed expressive language was variable. Phonemic fluency was average, semantic fluency was well below average, and confrontation naming was appropriate across a screening battery but well below average across a more  comprehensive task.    Assessed visuospatial/visuoconstructional abilities were variable, ranging from the well below average to average normative ranges. Points were lost on his drawing of a complex figure due to a few mild visual distortions and two internal aspects being omitted entirely.    Learning (i.e., encoding) of novel verbal information was exceptionally low to well below average. Spontaneous delayed recall (i.e., retrieval) of previously learned information was exceptionally low. Retention rates were 0% across a story learning task, 0% across a list learning task, and 0% across a figure drawing task. Performance across recognition tasks was exceptionally low to well below average, suggesting negligible evidence for information consolidation.   Results of emotional screening instruments suggested that recent symptoms of generalized anxiety were in the minimal range, while symptoms of depression were within normal limits. A screening instrument assessing recent sleep quality suggested the presence of minimal sleep dysfunction.  Tables of Scores:   Note: This summary of test scores accompanies the interpretive report and should not be considered in isolation without reference to the appropriate sections in the text. Descriptors are based on appropriate normative data and may be adjusted based on clinical judgment. Terms such as "Within Normal Limits" and "Outside Normal Limits" are used when a more specific description of the test score cannot be determined.       Percentile - Normative Descriptor > 98 - Exceptionally High 91-97 - Well Above Average 75-90 - Above Average 25-74 - Average 9-24 - Below Average 2-8 - Well Below Average < 2 - Exceptionally Low       Validity:   DESCRIPTOR       Dot Counting Test: --- --- Within Normal Limits  RBANS Effort Index: --- --- Outside Normal Limits  WAIS-IV Reliable Digit Span: --- --- Within Normal Limits       Orientation:      Raw Score  Percentile   NAB Orientation, Form 1 29/29 --- ---       Cognitive Screening:  Raw Score Percentile   SLUMS: 16/30 --- ---       RBANS, Form A: Standard Score/ Scaled Score Percentile   Total Score 59 <1 Exceptionally Low  Immediate Memory 61 <1 Exceptionally Low    List Learning 4 2 Well Below Average    Story Memory 3 1 Exceptionally Low  Visuospatial/Constructional 72 3 Well Below Average    Figure Copy 4 2 Well Below Average    Line Orientation 13/20 10-16 Below Average  Language 89 23 Below Average    Picture Naming 10/10 >75 Above Average    Semantic Fluency 5 5 Well Below Average  Attention 64 1 Exceptionally Low    Digit Span 5 5 Well Below Average    Coding 4 2 Well Below Average  Delayed Memory 52 <1 Exceptionally Low    List Recall 0/10 <2 Exceptionally Low    List Recognition 15/20 <2 Exceptionally Low    Story Recall 1 <1 Exceptionally Low    Story Recognition 6/12 5-6 Well Below Average    Figure Recall 1 <1 Exceptionally Low    Figure Recognition 1/8 <1 Exceptionally Low        Intellectual Functioning:      Standard Score Percentile   Test of Premorbid Functioning: 80 9 Below Average       Attention/Executive Function:     Trail Making Test (TMT): Raw Score (T Score) Percentile     Part A 68 secs.,  0 errors (45) 31 Average    Part B Discontinued --- Impaired         Scaled Score Percentile   WAIS-IV Digit Span: 9 37 Average    Forward 11 63 Average    Backward 8 25 Average    Sequencing 7 16 Below Average        Scaled Score Percentile   WAIS-IV Similarities: 5 5 Well Below Average       D-KEFS Color-Word Interference Test: Raw Score (Scaled Score) Percentile     Color Naming 37 secs. (10) 50 Average    Word Reading 24 secs. (11) 63 Average    Inhibition 125 secs. (6) 9 Below Average      Total Errors 22 errors (1) <1 Exceptionally Low    Inhibition/Switching 83 secs. (12) 75 Above Average      Total Errors 17 errors (1) <1 Exceptionally  Low       NAB Executive Functions Module, Form 1: T Score Percentile     Judgment 60 84 Above Average       Language:     Verbal Fluency Test: Raw Score (Scaled Score) Percentile     Phonemic Fluency (CFL) 28 (9) 37 Average    Category Fluency 21 (5) 5 Well Below Average  *Based on [employer] Older Normative Studies (MOANS)          NAB Language Module, Form 1: T Score Percentile     Auditory Comprehension 56 73 Average    Naming 24/31 (35) 7 Well Below Average       Visuospatial/Visuoconstruction:      Raw Score Percentile   Clock Drawing: 9/10 --- Within Normal Limits        Scaled Score Percentile   WAIS-IV Block Design: 10 50 Average       Mood and Personality:      Raw Score Percentile   Geriatric Depression Scale: 4 --- Within Normal Limits  Geriatric Anxiety Scale: 3 --- Minimal    Somatic 1 --- Minimal  Cognitive 2 --- Minimal    Affective 0 --- Minimal       Additional Questionnaires:      Raw Score Percentile   PROMIS Sleep Disturbance Questionnaire: 9 --- None to Slight   Informed Consent and Coding/Compliance:   The current evaluation represents a clinical evaluation for the purposes previously outlined by the referral source and is in no way reflective of a forensic evaluation.   Mr. Buell was provided with a verbal description of the nature and purpose of the present neuropsychological evaluation. Also reviewed were the foreseeable risks and/or discomforts and benefits of the procedure, limits of confidentiality, and mandatory reporting requirements of this provider. The patient was given the opportunity to ask questions and receive answers about the evaluation. Oral consent to participate was provided by the patient.   This evaluation was conducted by Newman Nickels, Ph.D., ABPP-CN, board certified clinical neuropsychologist. Mr. Meas completed a clinical interview with Dr. Milbert Coulter, billed as one unit [phone], and 125 minutes of cognitive testing and scoring,  billed as one unit [phone] and three additional units [id number]. Psychometrist Wallace Keller, B.S., assisted Dr. Milbert Coulter with test administration and scoring procedures. As a separate and discrete service, Dr. Milbert Coulter spent a total of 160 minutes in interpretation and report writing billed as one unit [phone] and two units [id number].

## 2023-02-09 ENCOUNTER — Ambulatory Visit (INDEPENDENT_AMBULATORY_CARE_PROVIDER_SITE_OTHER): Payer: Medicare Other | Admitting: Psychology

## 2023-02-09 DIAGNOSIS — F0670 Mild neurocognitive disorder due to known physiological condition without behavioral disturbance: Secondary | ICD-10-CM

## 2023-02-09 DIAGNOSIS — G309 Alzheimer's disease, unspecified: Secondary | ICD-10-CM

## 2023-02-09 NOTE — Progress Notes (Signed)
   Neuropsychology Feedback Session Eligha Bridegroom. [facility] [place] Department of Neurology  Reason for Referral:   APOLO CUTSHAW is a 82 y.o. right-handed African-American male referred by Marlowe Kays, PA-C, to characterize his current cognitive functioning and assist with diagnostic clarity and treatment planning in the context of subjective cognitive decline.   Feedback:   Mr. Wacker completed a comprehensive neuropsychological evaluation on 02/03/2023. Please refer to that encounter for the full report and recommendations. Briefly, results suggested severe impairment surrounding all aspects of learning and memory. Additional impairments were exhibited across executive functioning and semantic fluency. Performance variability was further exhibited across basic attention, confrontation naming, and visuospatial abilities. Regarding etiology, I have concerns for the presence of an underlying neurodegenerative illness, namely Alzheimer's disease. Across memory testing, he did not benefit from repeated exposure to information across learning trials, was fully amnestic (i.e., 0% retention) across all memory tasks after a brief delay, and performed poorly across yes/no recognition trials. 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 Impairments and/or weakness surrounding semantic fluency and confrontation naming would follow typical disease progression. There could be a vascular contribution given recent neuroimaging suggesting moderate microvascular ischemic disease. However, this would not explain amnestic memory and other patterns of performance in isolation. Overall, underlying Alzheimer's disease, potentially made worse by his cerebrovascular health, appears the most likely cause for ongoing memory impairment based upon currently available data. Continued medical monitoring will be  important moving forward.   Mr. Heft was accompanied by his significant other in person and his daughter via speakerphone during the current feedback session. Content of the current session focused on the results of his neuropsychological evaluation. Mr. Eckrich was given the opportunity to ask questions and his questions were answered. He was encouraged to reach out should additional questions arise. A copy of his report was provided at the conclusion of the visit.      Greater than 31 minutes were spent preparing for, conducting, and documenting the current feedback session with Mr. Pindell, billed as one unit [id number].

## 2023-02-10 ENCOUNTER — Encounter: Payer: Medicare Other | Admitting: Psychology

## 2023-02-26 ENCOUNTER — Other Ambulatory Visit: Payer: Self-pay | Admitting: Emergency Medicine

## 2023-02-26 DIAGNOSIS — I10 Essential (primary) hypertension: Secondary | ICD-10-CM

## 2023-03-10 ENCOUNTER — Ambulatory Visit: Payer: Medicare Other | Admitting: Physician Assistant

## 2023-03-10 ENCOUNTER — Other Ambulatory Visit: Payer: Self-pay | Admitting: Emergency Medicine

## 2023-03-10 DIAGNOSIS — E11319 Type 2 diabetes mellitus with unspecified diabetic retinopathy without macular edema: Secondary | ICD-10-CM

## 2023-03-29 ENCOUNTER — Encounter: Payer: Self-pay | Admitting: Physician Assistant

## 2023-03-29 ENCOUNTER — Ambulatory Visit (INDEPENDENT_AMBULATORY_CARE_PROVIDER_SITE_OTHER): Payer: Medicare Other | Admitting: Physician Assistant

## 2023-03-29 ENCOUNTER — Encounter: Payer: Self-pay | Admitting: Emergency Medicine

## 2023-03-29 VITALS — BP 103/48 | HR 70 | Resp 18 | Ht 66.0 in | Wt 140.0 lb

## 2023-03-29 DIAGNOSIS — R634 Abnormal weight loss: Secondary | ICD-10-CM

## 2023-03-29 DIAGNOSIS — F0670 Mild neurocognitive disorder due to known physiological condition without behavioral disturbance: Secondary | ICD-10-CM | POA: Diagnosis not present

## 2023-03-29 DIAGNOSIS — G309 Alzheimer's disease, unspecified: Secondary | ICD-10-CM | POA: Diagnosis not present

## 2023-03-29 MED ORDER — MEMANTINE HCL 10 MG PO TABS: 10.0000 mg | ORAL_TABLET | Freq: Two times a day (BID) | ORAL | 3 refills | Status: DC

## 2023-03-29 NOTE — Progress Notes (Signed)
Assessment/Plan:   Mild Cognitive Impairment likely due to Alzheimer's Disease  Tony Price is a very pleasant 82 y.o. RH male  with a history of hypertension, hyperlipidemia, DM2, diabetic retinopathy seen today in follow up for memory loss. Patient is currently on memantine 5 mg twice days . Patient is able to participate on his IADLs.  Continues to drive. Memory is stable .      Recommendations   Follow up in 6  months. Repeat Neuropsych evaluation if cognitive decline is noted.  Increase memantine to 10  mg bid, for better coverage. side effects discussed  Recommend checking hearing with audiology Recommend good control of her cardiovascular risk factors Continue to control mood as per PCP    Subjective:    This patient is here alone. Previous records as well as any outside records available were reviewed prior to todays visit. Patient was last seen on11/7/23 with MMSE 24/30     Any changes in memory since last visit?  "A little bit but not much" . Most of the time I remember recent conversations but the names not so good". He likes going to [place], watching TV, does not like to do any crossword puzzles or word finding repeats oneself?  Endorsed Disoriented when walking into a room?  Patient denies   Leaving objects in unusual places?    denies   Wandering behavior?  denies   Any personality changes since last visit?  denies   Any worsening depression?:  denies   Hallucinations or paranoia?  denies   Seizures?    denies    Any sleep changes?  Sleeps well  Denies vivid dreams, REM behavior or sleepwalking   Sleep apnea?   denies   Any hygiene concerns?    denies   Independent of bathing and dressing?  Endorsed  Does the patient needs help with medications? Patient is in charge   Who is in charge of the finances?   Patient is in charge     Any changes in appetite?  denies     Patient have trouble swallowing?  denies   Does the patient cook?  Yes Any kitchen  accidents such as leaving the stove on? Patient denies   Any headaches?   denies   Chronic back pain  denies   Ambulates with difficulty?  denies    Recent falls or head injuries? denies     Unilateral weakness, numbness or tingling? denies   Any tremors?  denies   Any anosmia?  Patient denies   Any incontinence of urine? He has BPH on Flomax Any bowel dysfunction?     denies      Patient lives with wife, daughter and grand daughter    Does the patient drive? yes, no issues     Initial visit 05/17/22 How long did patient have memory difficulties?  About 2 years, short-term memory is worse over the last 2 months.  "If I telling him going to do something, he will ask me again " Patient lives with: His daughter and granddaughter, according to the wife, they bring significant amount of stress, which wife feels that exacerbate his memory difficulties.   Repeats oneself?  Endorsed, he asked the same questions and tells the same stories. Disoriented when walking into a room?  Patient denies   Leaving objects in unusual places?  Patient denies   Ambulates  with difficulty?   Patient denies   Recent falls?  Patient denies   Any head injuries?  Patient denies   History of seizures?   Patient denies   Wandering behavior?  Patient denies   Patient drives?  No issues, may need GPS even when it was a familiar location  Any mood changes? More frustrated than before  Any history of depression?:  Patient denies   Hallucinations?  Patient denies   Paranoia?  Patient denies   Patient reports that he sleeps well without vivid dreams, REM behavior or sleepwalking   History of sleep apnea?  Patient denies   Any hygiene concerns?  Patient denies   Independent of bathing and dressing?  Endorsed  Does the patient needs help with medications?  Patient is in charge Who is in charge of the finances?  Patient is in charge    Patient have trouble swallowing? Patient denies   Does the patient cook?  Patient  denies   Any kitchen accidents such as leaving the stove on? Patient denies   Any headaches?  Patient denies   Double vision? Patient denies   Any focal numbness or tingling?  Patient denies   Chronic back pain Patient denies   Unilateral weakness?  Patient denies   Any tremors?  Patient denies   Any history of anosmia?  Patient denies   Any incontinence of urine?  Patient denies   Any bowel dysfunction?   Patient denies      History of heavy alcohol intake?  Patient denies   History of heavy tobacco use?  Patient denies   Family history of dementia?  Mother  and a first cousin had Alzheimer's disease     Neuropsych evaluation 02/03/23 Briefly, results suggested severe impairment surrounding all aspects of learning and memory. Additional impairments were exhibited across executive functioning and semantic fluency. Performance variability was further exhibited across basic attention, confrontation naming, and visuospatial abilities. Regarding etiology, I have concerns for the presence of an underlying neurodegenerative illness, namely Alzheimer's disease. Across memory testing, he did not benefit from repeated exposure to information across learning trials, was fully amnestic (i.e., 0% retention) across all memory tasks after a brief delay, and performed poorly across yes/no recognition trials. 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 Impairments and/or weakness surrounding semantic fluency and confrontation naming would follow typical disease progression. There could be a vascular contribution given recent neuroimaging suggesting moderate microvascular ischemic disease. However, this would not explain amnestic memory and other patterns of performance in isolation. Overall, underlying Alzheimer's disease, potentially made worse by his cerebrovascular health, appears the most likely cause for  ongoing memory impairment based upon currently available data. Continued medical monitoring will be important moving forward.     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   aspirin 81 MG EC tablet 1 tablet   atorvastatin (LIPITOR) 20 MG tablet TAKE 1 TABLET BY MOUTH EVERY DAY   blood glucose meter kit and supplies KIT 1 touch ultra meter.   Check blood sugar once daily E11.9   Blood Glucose Monitoring Suppl (ONE TOUCH ULTRA SYSTEM KIT) w/Device KIT Test blood sugar once  daily. Dx: E11.9   carboxymethylcellulose (REFRESH PLUS) 0.5 % SOLN 1 drop 2 (two) times daily as needed. Into both eyes   glimepiride (AMARYL) 2 MG tablet TAKE 1 TABLET BY MOUTH TWICE A DAY   glucose blood test strip Test blood sugar once daily. Dx: E11.9   Lancets (ONETOUCH ULTRASOFT) lancets Test blood sugar once daily. Dx: E11.9   latanoprost (XALATAN) 0.005 % ophthalmic solution 1 drop at bedtime.   memantine (NAMENDA) 5 MG tablet TAKE 1 TABLET (5 MG AT NIGHT) FOR 2 WEEKS, THEN INCREASE TO 1 TABLET (5 MG) TWICE A DAY   neomycin-polymyxin b-dexamethasone (MAXITROL) 3.5-10000-0.1 OINT SMARTSIG:Topical   sildenafil (VIAGRA) 100 MG tablet Take 0.5-1 tablets (50-100 mg total) by mouth daily as needed for erectile dysfunction.   tamsulosin (FLOMAX) 0.4 MG CAPS capsule Take 1 capsule (0.4 mg total) by mouth daily.   triamcinolone cream (KENALOG) 0.1 % APPLY TO AFFECTED AREA TWICE A DAY   valsartan (DIOVAN) 160 MG tablet Take 1 tablet (160 mg total) by mouth daily.   No facility-administered encounter medications on file as of 03/29/2023.        03/29/2023    3:00 PM 08/24/2022    2:00 PM 06/17/2016    1:33 PM  MMSE - Mini Mental State Exam  Orientation to time 5 3 5   Orientation to Place 4 5 5   Registration 3 3 3   Attention/ Calculation 5 5 5   Recall 0 0 2  Language- name 2 objects 2 2 2   Language- repeat 1 1 1   Language- follow 3 step command 3 3 3   Language- read & follow direction 1 1 1   Write a sentence 1 1 1   Copy design 1 0 1  Total score 26 24 29       05/17/2022    3:00 PM  Montreal Cognitive Assessment   Attention: Read list of digits (0/2) 2  Attention: Read list of letters (0/1) 1  Attention: Serial 7 subtraction starting at 100 (0/3) 3  Language: Repeat phrase (0/2) 0  Language : Fluency (0/1) 0  Abstraction (0/2) 0  Delayed Recall (0/5) 0  Orientation (0/6) 6    Objective:     PHYSICAL EXAMINATION:    VITALS:   Vitals:   03/29/23 1505  BP: (!) 103/48  Pulse: 70  Resp: 18  SpO2: 97%  Weight: 140 lb (63.5 kg)  Height: 5\' 6"  (1.676 m)    GEN:  The patient appears stated age and is in NAD. HEENT:  Normocephalic, atraumatic.   Neurological examination:  General: NAD, well-groomed, appears stated age. Orientation: The patient is alert. Oriented to person, place and date Cranial nerves: There is good facial symmetry.The speech is fluent and clear. No aphasia or dysarthria. Fund of knowledge is appropriate. Recent and remote memory are impaired. Attention and concentration are reduced.  Able to name objects and repeat phrases.  Hearing is intact to conversational tone. Delayed recall 0/3  Sensation: Sensation is intact to light touch throughout Motor: Strength is at least antigravity x4. DTR's 2/4 in UE/LE     Movement examination: Tone: There is normal tone in the UE/LE Abnormal movements:  no tremor.  No myoclonus.  No asterixis.   Coordination:  There is no decremation with RAM's. Normal finger to nose  Gait and Station: The patient has no difficulty arising out of a deep-seated  chair without the use of the hands. The patient's stride length is good.  Gait is cautious and narrow.  Thank you for allowing [place] the opportunity to participate in the care of this nice patient. Please do not hesitate to contact us for any questions or concerns.   Total time spent on today's visit was 20 minutes dedicated to this patient today, preparing to see patient, examining the patient, ordering tests and/or medications and counseling the patient, documenting clinical information in the EHR or other health record, independently interpreting results and communicating results to the patient/family, discussing treatment and goals, answering patient's questions and coordinating care.  Cc:  Georgina Quint, MD  Marlowe Kays 03/29/2023 3:18 PM

## 2023-03-29 NOTE — Patient Instructions (Addendum)
It was a pleasure to see you today at our office. You are doing well!!!   Recommendations:   Increase Memantine  to 10 mg tablets twice daily for better coverage  Follow up in 6 month   Whom to call:  Memory  decline, memory medications: Call our office [phone]   For psychiatric meds, mood meds: Please have your primary care physician manage these medications.   Counseling regarding caregiver distress, including caregiver depression, anxiety and issues regarding community resources, adult day care programs, adult living facilities, or memory care questions:   Feel free to contact Misty Lisabeth Register, Social Worker at [phone]   For assessment of decision of mental capacity and competency:  Call Dr. Erick Blinks, geriatric psychiatrist at [phone]  For guidance in geriatric dementia issues please call [facility] [phone]  For guidance regarding [facility] and if placement were needed at the facility, contact Sidney Ace, Social Worker tel: [phone]  If you have any severe symptoms of a stroke, or other severe issues such as confusion,severe chills or fever, etc call 911 or go to the ER as you may need to be evaluated further         RECOMMENDATIONS FOR ALL PATIENTS WITH MEMORY PROBLEMS: 1. Continue to exercise (Recommend 30 minutes of walking everyday, or 3 hours every week) 2. Increase social interactions - continue going to [place] and enjoy social gatherings with friends and family 3. Eat healthy, avoid fried foods and eat more fruits and vegetables 4. Maintain adequate blood pressure, blood sugar, and blood cholesterol level. Reducing the risk of stroke and cardiovascular disease also helps promoting better memory. 5. Avoid stressful situations. Live a simple life and avoid aggravations. Organize your time and prepare for the next day in anticipation. 6. Sleep well, avoid any interruptions of sleep and avoid any  distractions in the bedroom that may interfere with adequate sleep quality 7. Avoid sugar, avoid sweets as there is a strong link between excessive sugar intake, diabetes, and cognitive impairment We discussed the Mediterranean diet, which has been shown to help patients reduce the risk of progressive memory disorders and reduces cardiovascular risk. This includes eating fish, eat fruits and green leafy vegetables, nuts like almonds and hazelnuts, walnuts, and also use olive oil. Avoid fast foods and fried foods as much as possible. Avoid sweets and sugar as sugar use has been linked to worsening of memory function.  There is always a concern of gradual progression of memory problems. If this is the case, then we may need to adjust level of care according to patient needs. Support, both to the patient and caregiver, should then be put into place.      FALL PRECAUTIONS: Be cautious when walking. Scan the area for obstacles that may increase the risk of trips and falls. When getting up in the mornings, sit up at the edge of the bed for a few minutes before getting out of bed. Consider elevating the bed at the head end to avoid drop of blood pressure when getting up. Walk always in a well-lit room (use night lights in the walls). Avoid area rugs or power cords from appliances in the middle of the walkways. Use a walker or a cane if necessary and consider physical therapy for balance exercise. Get your eyesight checked regularly.  FINANCIAL OVERSIGHT: Supervision, especially oversight when making financial decisions or transactions is also recommended.  HOME SAFETY: Consider the safety of the kitchen when operating appliances like stoves,  microwave oven, and blender. Consider having supervision and share cooking responsibilities until no longer able to participate in those. Accidents with firearms and other hazards in the house should be identified and addressed as well.   ABILITY TO BE LEFT ALONE: If  patient is unable to contact 911 operator, consider using Life[facility], or when the need is there, arrange for someone to stay with patients. Smoking is a fire hazard, consider supervision or cessation. Risk of wandering should be assessed by caregiver and if detected at any point, supervision and safe proof recommendations should be instituted.  MEDICATION SUPERVISION: Inability to self-administer medication needs to be constantly addressed. Implement a mechanism to ensure safe administration of the medications.   DRIVING: Regarding driving, in patients with progressive memory problems, driving will be impaired. We advise to have someone else do the driving if trouble finding directions or if minor accidents are reported. Independent driving assessment is available to determine safety of driving.   If you are interested in the driving assessment, you can contact the following:  [facility] [employer] [facility] in [place] [phone]  [facility] [phone]  [facility] [phone] or [phone]    Mediterranean Diet A Mediterranean diet refers to food and lifestyle choices that are based on the traditions of countries located on the [employer]. This way of eating has been shown to help prevent certain conditions and improve outcomes for people who have chronic diseases, like kidney disease and heart disease. What are tips for following this plan? Lifestyle  Cook and eat meals together with your family, when possible. Drink enough fluid to keep your urine clear or pale yellow. Be physically active every day. This includes: Aerobic exercise like running or swimming. Leisure activities like gardening, walking, or housework. Get 7-8 hours of sleep each night. If recommended by your health care provider, drink red wine in moderation. This means 1 glass a day for nonpregnant women and 2 glasses a day for men. A glass of wine  equals 5 oz (150 mL). Reading food labels  Check the serving size of packaged foods. For foods such as rice and pasta, the serving size refers to the amount of cooked product, not dry. Check the total fat in packaged foods. Avoid foods that have saturated fat or trans fats. Check the ingredients list for added sugars, such as corn syrup. Shopping  At the grocery store, buy most of your food from the areas near the walls of the store. This includes: Fresh fruits and vegetables (produce). Grains, beans, nuts, and seeds. Some of these may be available in unpackaged forms or large amounts (in bulk). Fresh seafood. Poultry and eggs. Low-fat dairy products. Buy whole ingredients instead of prepackaged foods. Buy fresh fruits and vegetables in-season from local farmers markets. Buy frozen fruits and vegetables in resealable bags. If you do not have access to quality fresh seafood, buy precooked frozen shrimp or canned fish, such as tuna, salmon, or sardines. Buy small amounts of raw or cooked vegetables, salads, or olives from the deli or salad bar at your store. Stock your pantry so you always have certain foods on hand, such as olive oil, canned tuna, canned tomatoes, rice, pasta, and beans. Cooking  Cook foods with extra-virgin olive oil instead of using butter or other vegetable oils. Have meat as a side dish, and have vegetables or grains as your main dish. This means having meat in small portions or adding small amounts of meat to foods  like pasta or stew. Use beans or vegetables instead of meat in common dishes like chili or lasagna. Experiment with different cooking methods. Try roasting or broiling vegetables instead of steaming or sauteing them. Add frozen vegetables to soups, stews, pasta, or rice. Add nuts or seeds for added healthy fat at each meal. You can add these to yogurt, salads, or vegetable dishes. Marinate fish or vegetables using olive oil, lemon juice, garlic, and fresh  herbs. Meal planning  Plan to eat 1 vegetarian meal one day each week. Try to work up to 2 vegetarian meals, if possible. Eat seafood 2 or more times a week. Have healthy snacks readily available, such as: Vegetable sticks with hummus. Greek yogurt. Fruit and nut trail mix. Eat balanced meals throughout the week. This includes: Fruit: 2-3 servings a day Vegetables: 4-5 servings a day Low-fat dairy: 2 servings a day Fish, poultry, or lean meat: 1 serving a day Beans and legumes: 2 or more servings a week Nuts and seeds: 1-2 servings a day Whole grains: 6-8 servings a day Extra-virgin olive oil: 3-4 servings a day Limit red meat and sweets to only a few servings a month What are my food choices? Mediterranean diet Recommended Grains: Whole-grain pasta. Brown rice. Bulgar wheat. Polenta. Couscous. Whole-wheat bread. Orpah Cobb. Vegetables: Artichokes. Beets. Broccoli. Cabbage. Carrots. Eggplant. Green beans. Chard. Kale. Spinach. Onions. Leeks. Peas. Squash. Tomatoes. Peppers. Radishes. Fruits: Apples. Apricots. Avocado. Berries. Bananas. Cherries. Dates. Figs. Grapes. Lemons. Melon. Oranges. Peaches. Plums. Pomegranate. Meats and other protein foods: Beans. Almonds. Sunflower seeds. Pine nuts. Peanuts. Cod. Salmon. Scallops. Shrimp. Tuna. Tilapia. Clams. Oysters. Eggs. Dairy: Low-fat milk. Cheese. Greek yogurt. Beverages: Water. Red wine. Herbal tea. Fats and oils: Extra virgin olive oil. Avocado oil. Grape seed oil. Sweets and desserts: [place] yogurt with honey. Baked apples. Poached pears. Trail mix. Seasoning and other foods: Basil. Cilantro. Coriander. Cumin. Mint. Parsley. Sage. Rosemary. Tarragon. Garlic. Oregano. Thyme. Pepper. Balsalmic vinegar. Tahini. Hummus. Tomato sauce. Olives. Mushrooms. Limit these Grains: Prepackaged pasta or rice dishes. Prepackaged cereal with added sugar. Vegetables: Deep fried potatoes (french fries). Fruits: Fruit canned in syrup. Meats and  other protein foods: Beef. Pork. Lamb. Poultry with skin. Hot dogs. Tomasa Blase. Dairy: Ice cream. Sour cream. Whole milk. Beverages: Juice. Sugar-sweetened soft drinks. Beer. Liquor and spirits. Fats and oils: Butter. Canola oil. Vegetable oil. Beef fat (tallow). Lard. Sweets and desserts: Cookies. Cakes. Pies. Candy. Seasoning and other foods: Mayonnaise. Premade sauces and marinades. The items listed may not be a complete list. Talk with your dietitian about what dietary choices are right for you. Summary The Mediterranean diet includes both food and lifestyle choices. Eat a variety of fresh fruits and vegetables, beans, nuts, seeds, and whole grains. Limit the amount of red meat and sweets that you eat. Talk with your health care provider about whether it is safe for you to drink red wine in moderation. This means 1 glass a day for nonpregnant women and 2 glasses a day for men. A glass of wine equals 5 oz (150 mL). This information is not intended to replace advice given to you by your health care provider. Make sure you discuss any questions you have with your health care provider. Document Released: 05/27/2016 Document Revised: 06/29/2016 Document Reviewed: 05/27/2016 Elsevier Interactive Patient Education  2017 [employer].   We have sent a referral to [facility] for your MRI and they will call you directly to schedule your appointment. They are located at [address] [facility].  If you need to contact them directly please call [phone].  Your provider has requested that you have labwork completed today. Please go to [facility] ([id number]) on the second floor of this building before leaving the office today. You do not need to check in. If you are not called within 15 minutes please check with the front desk.

## 2023-03-30 NOTE — Telephone Encounter (Signed)
Continue Ensure supplementation and refer to nutritionist.  Thanks.

## 2023-05-11 ENCOUNTER — Ambulatory Visit (INDEPENDENT_AMBULATORY_CARE_PROVIDER_SITE_OTHER): Payer: Medicare Other | Admitting: Podiatry

## 2023-05-11 DIAGNOSIS — Z91199 Patient's noncompliance with other medical treatment and regimen due to unspecified reason: Secondary | ICD-10-CM

## 2023-05-11 NOTE — Progress Notes (Signed)
1. No-show for appointment     

## 2023-05-17 ENCOUNTER — Other Ambulatory Visit: Payer: Self-pay | Admitting: Emergency Medicine

## 2023-05-17 DIAGNOSIS — E1159 Type 2 diabetes mellitus with other circulatory complications: Secondary | ICD-10-CM

## 2023-05-17 DIAGNOSIS — E785 Hyperlipidemia, unspecified: Secondary | ICD-10-CM

## 2023-05-23 DIAGNOSIS — R972 Elevated prostate specific antigen [PSA]: Secondary | ICD-10-CM | POA: Diagnosis not present

## 2023-05-30 ENCOUNTER — Telehealth: Payer: Self-pay | Admitting: Emergency Medicine

## 2023-05-30 DIAGNOSIS — R972 Elevated prostate specific antigen [PSA]: Secondary | ICD-10-CM | POA: Diagnosis not present

## 2023-05-30 DIAGNOSIS — N5201 Erectile dysfunction due to arterial insufficiency: Secondary | ICD-10-CM | POA: Diagnosis not present

## 2023-05-30 DIAGNOSIS — R351 Nocturia: Secondary | ICD-10-CM | POA: Diagnosis not present

## 2023-05-30 DIAGNOSIS — N401 Enlarged prostate with lower urinary tract symptoms: Secondary | ICD-10-CM | POA: Diagnosis not present

## 2023-05-30 NOTE — Telephone Encounter (Signed)
Started to document but realized his PCP was at another office

## 2023-05-31 DIAGNOSIS — H402231 Chronic angle-closure glaucoma, bilateral, mild stage: Secondary | ICD-10-CM | POA: Diagnosis not present

## 2023-05-31 DIAGNOSIS — H35033 Hypertensive retinopathy, bilateral: Secondary | ICD-10-CM | POA: Diagnosis not present

## 2023-05-31 DIAGNOSIS — H35341 Macular cyst, hole, or pseudohole, right eye: Secondary | ICD-10-CM | POA: Diagnosis not present

## 2023-05-31 DIAGNOSIS — E113293 Type 2 diabetes mellitus with mild nonproliferative diabetic retinopathy without macular edema, bilateral: Secondary | ICD-10-CM | POA: Diagnosis not present

## 2023-05-31 LAB — HM DIABETES EYE EXAM

## 2023-06-02 ENCOUNTER — Other Ambulatory Visit: Payer: Self-pay | Admitting: Urology

## 2023-06-02 DIAGNOSIS — R972 Elevated prostate specific antigen [PSA]: Secondary | ICD-10-CM

## 2023-06-15 ENCOUNTER — Other Ambulatory Visit: Payer: Self-pay | Admitting: Emergency Medicine

## 2023-06-15 DIAGNOSIS — E785 Hyperlipidemia, unspecified: Secondary | ICD-10-CM

## 2023-06-15 DIAGNOSIS — I10 Essential (primary) hypertension: Secondary | ICD-10-CM

## 2023-06-15 DIAGNOSIS — E1159 Type 2 diabetes mellitus with other circulatory complications: Secondary | ICD-10-CM

## 2023-06-18 ENCOUNTER — Other Ambulatory Visit: Payer: Self-pay | Admitting: Physician Assistant

## 2023-06-30 ENCOUNTER — Telehealth: Payer: Self-pay | Admitting: Emergency Medicine

## 2023-06-30 NOTE — Telephone Encounter (Signed)
Pt daughter having concerns about an medication and thinks the medication make pt worse. Pt daughter would like a call back from nurse.   Denita [phone]

## 2023-07-01 ENCOUNTER — Encounter: Payer: Self-pay | Admitting: *Deleted

## 2023-07-04 ENCOUNTER — Telehealth: Payer: Self-pay | Admitting: Physician Assistant

## 2023-07-04 ENCOUNTER — Other Ambulatory Visit: Payer: Self-pay

## 2023-07-04 MED ORDER — MEMANTINE HCL 10 MG PO TABS: 10.0000 mg | ORAL_TABLET | Freq: Two times a day (BID) | ORAL | 1 refills | Status: DC

## 2023-07-04 NOTE — Telephone Encounter (Signed)
Caller stated she would like to know if there's a different mediation that can be scribed for [employer]. Stated the medication memantine (NAMENDA) 10 MG tablet [[id number]] is making his memory worse.

## 2023-07-13 ENCOUNTER — Ambulatory Visit: Payer: Medicare Other | Admitting: *Deleted

## 2023-07-13 DIAGNOSIS — Z0000 Encounter for general adult medical examination without abnormal findings: Secondary | ICD-10-CM

## 2023-07-13 NOTE — Progress Notes (Signed)
Subjective:   Tony Price is a 82 y.o. male who presents for Medicare Annual/Subsequent preventive examination.  Visit Complete: Virtual  I connected with  Tony Price on 07/13/23 by a audio enabled telemedicine application and verified that I am speaking with the correct person using two identifiers.  Patient Location: Home  Provider Location: Home Office  I discussed the limitations of evaluation and management by telemedicine. The patient expressed understanding and agreed to proceed.  Because this visit was a virtual/telehealth visit, some criteria may be missing or patient reported. Any vitals not documented were not able to be obtained and vitals that have been documented are patient reported.    Cardiac Risk Factors include: advanced age (>93men, >15 women);diabetes mellitus;male gender;hypertension     Objective:    There were no vitals filed for this visit. There is no height or weight on file to calculate BMI.     07/13/2023    4:15 PM 03/29/2023    3:09 PM 08/24/2022    2:36 PM 07/07/2022   11:33 AM 05/17/2022    1:38 PM 09/24/2021    9:30 AM 07/05/2021    1:09 PM  Advanced Directives  Does Patient Have a Medical Advance Directive? Yes Yes Yes Yes Yes No Yes  Type of Sales promotion account executive of [employer] Power of [place];Living will   Living will  Does patient want to make changes to medical advance directive?  No - Patient declined     No - Patient declined  Copy of Healthcare Power of Attorney in Chart? No - copy requested No - copy requested  No - copy requested   No - copy requested    Current Medications (verified) Outpatient Encounter Medications as of 07/13/2023  Medication Sig   amLODipine (NORVASC) 10 MG tablet TAKE 1 TABLET BY MOUTH EVERY DAY   Ascorbic Acid (VITAMIN C) 500 MG CAPS See admin instructions.   aspirin 81 MG EC tablet 1 tablet   atorvastatin (LIPITOR) 20 MG tablet TAKE 1 TABLET BY  MOUTH EVERY DAY   blood glucose meter kit and supplies KIT 1 touch ultra meter.   Check blood sugar once daily E11.9   Blood Glucose Monitoring Suppl (ONE TOUCH ULTRA SYSTEM KIT) w/Device KIT Test blood sugar once daily. Dx: E11.9   carboxymethylcellulose (REFRESH PLUS) 0.5 % SOLN 1 drop 2 (two) times daily as needed. Into both eyes   glimepiride (AMARYL) 2 MG tablet TAKE 1 TABLET BY MOUTH TWICE A DAY   glucose blood test strip Test blood sugar once daily. Dx: E11.9   Lancets (ONETOUCH ULTRASOFT) lancets Test blood sugar once daily. Dx: E11.9   latanoprost (XALATAN) 0.005 % ophthalmic solution 1 drop at bedtime.   memantine (NAMENDA) 10 MG tablet Take 1 tablet (10 mg total) by mouth 2 (two) times daily.   neomycin-polymyxin b-dexamethasone (MAXITROL) 3.5-10000-0.1 OINT SMARTSIG:Topical   sildenafil (VIAGRA) 100 MG tablet Take 0.5-1 tablets (50-100 mg total) by mouth daily as needed for erectile dysfunction.   tamsulosin (FLOMAX) 0.4 MG CAPS capsule Take 1 capsule (0.4 mg total) by mouth daily.   triamcinolone cream (KENALOG) 0.1 % APPLY TO AFFECTED AREA TWICE A DAY   valsartan (DIOVAN) 160 MG tablet TAKE 1 TABLET BY MOUTH EVERY DAY   No facility-administered encounter medications on file as of 07/13/2023.    Allergies (verified) Patient has no known allergies.   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   Type II diabetes  mellitus (HCC) 02/03/2023   Past Surgical History:  Procedure Laterality Date   CATARACT EXTRACTION     EYE SURGERY     PROSTATE BIOPSY     ROTATOR CUFF REPAIR     Right   Family History  Problem Relation Age of Onset   Dementia Cousin        maternal; x2   Social History   Socioeconomic History   Marital status: Divorced    Spouse name: Not on file   Number of children: 3   Years of education: 12   Highest education level: High school graduate  Occupational History   Occupation: Retired  Tobacco Use   Smoking status: Former    Types: Cigarettes   Smokeless tobacco: Never  Substance and Sexual Activity   Alcohol use: No    Alcohol/week: 0.0 standard drinks of alcohol   Drug use: No   Sexual activity: Not Currently  Other Topics Concern   Not on file  Social History Narrative   Right handed   No caffeine   No falls   One story home   Social Determinants of Health   Financial Resource Strain: Low Risk  (07/13/2023)   Overall Financial Resource Strain (CARDIA)    Difficulty of Paying Living Expenses: Not hard at all  Food Insecurity: No Food Insecurity (07/13/2023)   Hunger Vital Sign    Worried About Running Out of Food in the Last Year: Never true    Ran Out of Food in the Last Year: Never true  Transportation Needs: No Transportation Needs (07/13/2023)   PRAPARE - Administrator, Civil Service (Medical): No    Lack of Transportation (Non-Medical): No  Physical Activity: Sufficiently Active (07/13/2023)   Exercise Vital Sign    Days of Exercise per Week: 3 days    Minutes of Exercise per Session: 50 min  Stress: No Stress Concern Present (07/13/2023)   [employer] of Occupational Health - Occupational Stress Questionnaire    Feeling of Stress : Not at all  Social Connections: Unknown (07/13/2023)   Social Connection and Isolation Panel [NHANES]    Frequency of Communication with Friends and Family: More than three times a week    Frequency of  Social Gatherings with Friends and Family: More than three times a week    Attends Religious Services: Patient unable to answer    Active Member of Clubs or Organizations: No    Attends Engineer, structural: Not on file    Marital Status: Divorced    Tobacco Counseling Counseling given: Not Answered   Clinical Intake:  Pre-visit preparation completed: Yes  Pain : No/denies pain     Diabetes: Yes CBG done?: No Did pt. bring in CBG monitor from home?: No  How often do you need to have someone help you when you read instructions, pamphlets, or other written materials from your doctor or pharmacy?: 1 - Never  Interpreter Needed?: No  Information  entered by :: Remi Haggard LPN   Activities of Daily Living    07/13/2023    4:16 PM  In your present state of health, do you have any difficulty performing the following activities:  Hearing? 0  Vision? 0  Difficulty concentrating or making decisions? 0  Walking or climbing stairs? 0  Dressing or bathing? 0  Doing errands, shopping? 0  Preparing Food and eating ? N  Using the Toilet? N  In the past six months, have you accidently leaked urine? N  Do you have problems with loss of bowel control? N  Managing your Medications? N  Managing your Finances? N  Housekeeping or managing your Housekeeping? N    Patient Care Team: Georgina Quint, MD as PCP - General (Internal Medicine) Sherrie George, MD as Consulting Physician (Ophthalmology) Mateo Flow, MD as Consulting Physician (Ophthalmology) Elwyn Reach (Neurology)  Indicate any recent Medical Services you may have received from other than [facility] providers in the past year (date may be approximate).     Assessment:   This is a routine wellness examination for [place].  Hearing/Vision screen Hearing Screening - Comments:: No trouble hearing Vision Screening - Comments:: Elmer Picker Up to date   Goals Addressed             This Visit's  Progress    Patient Stated       Continue current lifestyle       Depression Screen    07/13/2023    4:20 PM 10/20/2022    1:39 PM 07/29/2022   10:18 AM 07/07/2022   11:37 AM 04/28/2022    1:43 PM 07/27/2021    9:56 AM 07/05/2021    1:05 PM  PHQ 2/9 Scores  PHQ - 2 Score 0 0 0 0 0 0 0  PHQ- 9 Score 0          Fall Risk    07/13/2023    4:15 PM 03/29/2023    3:09 PM 10/20/2022    1:39 PM 08/24/2022    2:36 PM 07/29/2022   10:18 AM  Fall Risk   Falls in the past year? 0 0 0 0 0  Number falls in past yr: 0 0 0 0 0  Injury with Fall? 0 0 0 0 0  Risk for fall due to :   No Fall Risks  No Fall Risks  Follow up Falls evaluation completed;Education provided;Falls prevention discussed Falls evaluation completed Falls evaluation completed Falls evaluation completed Falls evaluation completed    MEDICARE RISK AT HOME: Medicare Risk at Home Any stairs in or around the home?: No If so, are there any without handrails?: No Home free of loose throw rugs in walkways, pet beds, electrical cords, etc?: Yes Adequate lighting in your home to reduce risk of falls?: Yes Life alert?: No Use of a cane, walker or w/c?: No Grab bars in the bathroom?: No Shower chair or bench in shower?: Yes Elevated toilet seat or a handicapped toilet?: No  TIMED UP AND GO:  Was the test performed?  No    Cognitive Function:    03/29/2023    3:00 PM 08/24/2022    2:00 PM 06/17/2016    1:33 PM  MMSE - Mini Mental State Exam  Orientation to time 5 3 5   Orientation to Place 4 5 5   Registration 3 3 3   Attention/ Calculation 5 5 5   Recall 0 0 2  Language- name 2 objects 2 2 2   Language-  repeat 1 1 1   Language- follow 3 step command 3 3 3   Language- read & follow direction 1 1 1   Write a sentence 1 1 1   Copy design 1 0 1  Total score 26 24 29       05/17/2022    3:00 PM  Montreal Cognitive Assessment   Attention: Read list of digits (0/2) 2  Attention: Read list of letters (0/1) 1  Attention: Serial 7  subtraction starting at 100 (0/3) 3  Language: Repeat phrase (0/2) 0  Language : Fluency (0/1) 0  Abstraction (0/2) 0  Delayed Recall (0/5) 0  Orientation (0/6) 6      07/13/2023    4:18 PM 07/07/2022   11:35 AM 07/05/2021    1:07 PM 06/14/2019    1:05 PM 07/08/2017    3:05 PM  6CIT Screen  What Year? 0 points 0 points 0 points 0 points 0 points  What month? 0 points 0 points 0 points 0 points 0 points  What time? 0 points 0 points 0 points 0 points 0 points  Count back from 20 0 points 0 points 0 points 0 points 0 points  Months in reverse 2 points 0 points 0 points 0 points 0 points  Repeat phrase 10 points 0 points 10 points 0 points 6 points  Total Score 12 points 0 points 10 points 0 points 6 points    Immunizations Immunization History  Administered Date(s) Administered   Fluad Quad(high Dose 65+) 07/06/2021, 07/29/2022   PFIZER(Purple Top)SARS-COV-2 Vaccination 12/03/2019, 12/29/2019, 08/02/2020   Pneumococcal Polysaccharide-23 02/21/2020   Tdap 10/18/2010    TDAP status: Due, Education has been provided regarding the importance of this vaccine. Advised may receive this vaccine at local pharmacy or Health Dept. Aware to provide a copy of the vaccination record if obtained from local pharmacy or Health Dept. Verbalized acceptance and understanding.  Flu Vaccine status: Due, Education has been provided regarding the importance of this vaccine. Advised may receive this vaccine at local pharmacy or Health Dept. Aware to provide a copy of the vaccination record if obtained from local pharmacy or Health Dept. Verbalized acceptance and understanding.  Pneumococcal vaccine status: Due, Education has been provided regarding the importance of this vaccine. Advised may receive this vaccine at local pharmacy or Health Dept. Aware to provide a copy of the vaccination record if obtained from local pharmacy or Health Dept. Verbalized acceptance and understanding.  Covid-19 vaccine status:  Information provided on how to obtain vaccines.   Qualifies for Shingles Vaccine? Yes   Zostavax completed No   Shingrix Completed?: No.    Education has been provided regarding the importance of this vaccine. Patient has been advised to call insurance company to determine out of pocket expense if they have not yet received this vaccine. Advised may also receive vaccine at local pharmacy or Health Dept. Verbalized acceptance and understanding.  Screening Tests Health Maintenance  Topic Date Due   HEMOGLOBIN A1C  04/20/2023   Diabetic kidney evaluation - eGFR measurement  04/29/2023   Diabetic kidney evaluation - Urine ACR  04/29/2023   COVID-19 Vaccine (4 - 2023-24 season) 07/29/2023 (Originally 06/19/2023)   Pneumonia Vaccine 64+ Years old (2 of 2 - PCV) 07/30/2023 (Originally 02/20/2021)   Zoster Vaccines- Shingrix (1 of 2) 10/12/2023 (Originally 10/03/1991)   INFLUENZA VACCINE  01/16/2024 (Originally 05/19/2023)   FOOT EXAM  01/05/2024   OPHTHALMOLOGY EXAM  05/30/2024   Medicare Annual Wellness (AWV)  07/12/2024   HPV VACCINES  Aged Out   DTaP/Tdap/Td  Discontinued   Colonoscopy  Discontinued    Health Maintenance  Health Maintenance Due  Topic Date Due   HEMOGLOBIN A1C  04/20/2023   Diabetic kidney evaluation - eGFR measurement  04/29/2023   Diabetic kidney evaluation - Urine ACR  04/29/2023    Colorectal cancer screening: No longer required.   Lung Cancer Screening: (Low Dose CT Chest recommended if Age 71-80 years, 20 pack-year currently smoking OR have quit w/in 15years.) does not qualify.   Lung Cancer Screening Referral:   Additional Screening:  Hepatitis C Screening: does not qualify;   Vision Screening: Recommended annual ophthalmology exams for early detection of glaucoma and other disorders of the eye. Is the patient up to date with their annual eye exam?  Yes  Who is the provider or what is the name of the office in which the patient attends annual eye exams?  [facility] If pt is not established with a provider, would they like to be referred to a provider to establish care? No .   Dental Screening: Recommended annual dental exams for proper oral hygiene  Nutrition Risk Assessment:  Has the patient had any N/V/D within the last 2 months?  No  Does the patient have any non-healing wounds?  No  Has the patient had any unintentional weight loss or weight gain?  No   Diabetes:  Is the patient diabetic?  Yes  If diabetic, was a CBG obtained today?  No  Did the patient bring in their glucometer from home?  No  How often do you monitor your CBG's? .   Financial Strains and Diabetes Management:  Are you having any financial strains with the device, your supplies or your medication? No .  Does the patient want to be seen by Chronic Care Management for management of their diabetes?  No  Would the patient like to be referred to a Nutritionist or for Diabetic Management?  No   Diabetic Exams:  Diabetic Eye Exam: Completed . Overdue for diabetic eye exam. Pt has been advised about the importance in completing this exam.  Diabetic Foot Exam: . Pt has been advised about the importance in completing this exam.  Community Resource Referral / Chronic Care Management: CRR required this visit?  No   CCM required this visit?  No     Plan:     I have personally reviewed and noted the following in the patient's chart:   Medical and social history Use of alcohol, tobacco or illicit drugs  Current medications and supplements including opioid prescriptions. Patient is not currently taking opioid prescriptions. Functional ability and status Nutritional status Physical activity Advanced directives List of other physicians Hospitalizations, surgeries, and ER visits in previous 12 months Vitals Screenings to include cognitive, depression, and falls Referrals and appointments  In addition, I have reviewed and discussed with patient certain preventive  protocols, quality metrics, and best practice recommendations. A written personalized care plan for preventive services as well as general preventive health recommendations were provided to patient.     Remi Haggard, LPN   5/40/9811   After Visit Summary: (MyChart) Due to this being a telephonic visit, the after visit summary with patients personalized plan was offered to patient via MyChart   Nurse Notes:

## 2023-07-13 NOTE — Patient Instructions (Signed)
Tony Price , Thank you for taking time to come for your Medicare Wellness Visit. I appreciate your ongoing commitment to your health goals. Please review the following plan we discussed and let me know if I can assist you in the future.   Screening recommendations/referrals: Colonoscopy: no longer required Recommended yearly ophthalmology/optometry visit for glaucoma screening and checkup Recommended yearly dental visit for hygiene and checkup  Vaccinations: Influenza vaccine: Education provided Pneumococcal vaccine: Education provided Tdap vaccine: Education provided Shingles vaccine: Education provided    Advanced directives: yes not on file    Preventive Care 65 Years and Older, Male Preventive care refers to lifestyle choices and visits with your health care provider that can promote health and wellness. What does preventive care include? A yearly physical exam. This is also called an annual well check. Dental exams once or twice a year. Routine eye exams. Ask your health care provider how often you should have your eyes checked. Personal lifestyle choices, including: Daily care of your teeth and gums. Regular physical activity. Eating a healthy diet. Avoiding tobacco and drug use. Limiting alcohol use. Practicing safe sex. Taking low doses of aspirin every day. Taking vitamin and mineral supplements as recommended by your health care provider. What happens during an annual well check? The services and screenings done by your health care provider during your annual well check will depend on your age, overall health, lifestyle risk factors, and family history of disease. Counseling  Your health care provider may ask you questions about your: Alcohol use. Tobacco use. Drug use. Emotional well-being. Home and relationship well-being. Sexual activity. Eating habits. History of falls. Memory and ability to understand (cognition). Work and work Astronomer. Screening   You may have the following tests or measurements: Height, weight, and BMI. Blood pressure. Lipid and cholesterol levels. These may be checked every 5 years, or more frequently if you are over 75 years old. Skin check. Lung cancer screening. You may have this screening every year starting at age 82 if you have a 30-pack-year history of smoking and currently smoke or have quit within the past 15 years. Fecal occult blood test (FOBT) of the stool. You may have this test every year starting at age 82. Flexible sigmoidoscopy or colonoscopy. You may have a sigmoidoscopy every 5 years or a colonoscopy every 10 years starting at age 82. Prostate cancer screening. Recommendations will vary depending on your family history and other risks. Hepatitis C blood test. Hepatitis B blood test. Sexually transmitted disease (STD) testing. Diabetes screening. This is done by checking your blood sugar (glucose) after you have not eaten for a while (fasting). You may have this done every 1-3 years. Abdominal aortic aneurysm (AAA) screening. You may need this if you are a current or former smoker. Osteoporosis. You may be screened starting at age 82 if you are at high risk. Talk with your health care provider about your test results, treatment options, and if necessary, the need for more tests. Vaccines  Your health care provider may recommend certain vaccines, such as: Influenza vaccine. This is recommended every year. Tetanus, diphtheria, and acellular pertussis (Tdap, Td) vaccine. You may need a Td booster every 10 years. Zoster vaccine. You may need this after age 56. Pneumococcal 13-valent conjugate (PCV13) vaccine. One dose is recommended after age 41. Pneumococcal polysaccharide (PPSV23) vaccine. One dose is recommended after age 40. Talk to your health care provider about which screenings and vaccines you need and how often you need them. This information  is not intended to replace advice given to you by  your health care provider. Make sure you discuss any questions you have with your health care provider. Document Released: 10/31/2015 Document Revised: 06/23/2016 Document Reviewed: 08/05/2015 Elsevier Interactive Patient Education  2017 [employer].  Fall Prevention in the Home Falls can cause injuries. They can happen to people of all ages. There are many things you can do to make your home safe and to help prevent falls. What can I do on the outside of my home? Regularly fix the edges of walkways and driveways and fix any cracks. Remove anything that might make you trip as you walk through a door, such as a raised step or threshold. Trim any bushes or trees on the path to your home. Use bright outdoor lighting. Clear any walking paths of anything that might make someone trip, such as rocks or tools. Regularly check to see if handrails are loose or broken. Make sure that both sides of any steps have handrails. Any raised decks and porches should have guardrails on the edges. Have any leaves, snow, or ice cleared regularly. Use sand or salt on walking paths during winter. Clean up any spills in your garage right away. This includes oil or grease spills. What can I do in the bathroom? Use night lights. Install grab bars by the toilet and in the tub and shower. Do not use towel bars as grab bars. Use non-skid mats or decals in the tub or shower. If you need to sit down in the shower, use a plastic, non-slip stool. Keep the floor dry. Clean up any water that spills on the floor as soon as it happens. Remove soap buildup in the tub or shower regularly. Attach bath mats securely with double-sided non-slip rug tape. Do not have throw rugs and other things on the floor that can make you trip. What can I do in the bedroom? Use night lights. Make sure that you have a light by your bed that is easy to reach. Do not use any sheets or blankets that are too big for your bed. They should not hang  down onto the floor. Have a firm chair that has side arms. You can use this for support while you get dressed. Do not have throw rugs and other things on the floor that can make you trip. What can I do in the kitchen? Clean up any spills right away. Avoid walking on wet floors. Keep items that you use a lot in easy-to-reach places. If you need to reach something above you, use a strong step stool that has a grab bar. Keep electrical cords out of the way. Do not use floor polish or wax that makes floors slippery. If you must use wax, use non-skid floor wax. Do not have throw rugs and other things on the floor that can make you trip. What can I do with my stairs? Do not leave any items on the stairs. Make sure that there are handrails on both sides of the stairs and use them. Fix handrails that are broken or loose. Make sure that handrails are as long as the stairways. Check any carpeting to make sure that it is firmly attached to the stairs. Fix any carpet that is loose or worn. Avoid having throw rugs at the top or bottom of the stairs. If you do have throw rugs, attach them to the floor with carpet tape. Make sure that you have a light switch at the top of  the stairs and the bottom of the stairs. If you do not have them, ask someone to add them for you. What else can I do to help prevent falls? Wear shoes that: Do not have high heels. Have rubber bottoms. Are comfortable and fit you well. Are closed at the toe. Do not wear sandals. If you use a stepladder: Make sure that it is fully opened. Do not climb a closed stepladder. Make sure that both sides of the stepladder are locked into place. Ask someone to hold it for you, if possible. Clearly mark and make sure that you can see: Any grab bars or handrails. First and last steps. Where the edge of each step is. Use tools that help you move around (mobility aids) if they are needed. These  include: Canes. Walkers. Scooters. Crutches. Turn on the lights when you go into a dark area. Replace any light bulbs as soon as they burn out. Set up your furniture so you have a clear path. Avoid moving your furniture around. If any of your floors are uneven, fix them. If there are any pets around you, be aware of where they are. Review your medicines with your doctor. Some medicines can make you feel dizzy. This can increase your chance of falling. Ask your doctor what other things that you can do to help prevent falls. This information is not intended to replace advice given to you by your health care provider. Make sure you discuss any questions you have with your health care provider. Document Released: 07/31/2009 Document Revised: 03/11/2016 Document Reviewed: 11/08/2014 Elsevier Interactive Patient Education  2017 [employer].

## 2023-07-18 ENCOUNTER — Encounter: Payer: Self-pay | Admitting: Physician Assistant

## 2023-07-21 ENCOUNTER — Ambulatory Visit
Admission: RE | Admit: 2023-07-21 | Discharge: 2023-07-21 | Disposition: A | Discharge status: Home / Self Care | Payer: Medicare Other | Source: Ambulatory Visit | Attending: Urology | Admitting: Urology

## 2023-07-21 DIAGNOSIS — R972 Elevated prostate specific antigen [PSA]: Secondary | ICD-10-CM

## 2023-07-21 MED ORDER — GADOPICLENOL 0.5 MMOL/ML IV SOLN
6.0000 mL | Freq: Once | INTRAVENOUS | Status: AC | PRN
  Administered 2023-07-21: 6 mL via INTRAVENOUS

## 2023-07-27 ENCOUNTER — Other Ambulatory Visit: Payer: Self-pay | Admitting: Physician Assistant

## 2023-07-27 ENCOUNTER — Telehealth: Payer: Self-pay | Admitting: Physician Assistant

## 2023-07-27 MED ORDER — DONEPEZIL HCL 5 MG PO TABS: 5.0000 mg | ORAL_TABLET | Freq: Every day | ORAL | 11 refills | Status: DC

## 2023-07-27 NOTE — Telephone Encounter (Signed)
Patients daughter called  AN stating that she has waited for 3 weeks to hear back about medication changes for Patient. He is getting worse on the medication so he stopped taking the medication

## 2023-07-27 NOTE — Telephone Encounter (Signed)
Adding to this note, daughter Marthe Patch has called several times and said,"she has left multiple messages for a return call."I advised to call back in a few hours if she hasn't heard anything. I did advise it looks like only one message was noted and it was not sent to the provider. She was not upset, she just wanted me to be aware.

## 2023-07-27 NOTE — Telephone Encounter (Signed)
Please send in Rx for Aricept thank you, pharmacy is correct

## 2023-07-27 NOTE — Telephone Encounter (Signed)
Pt's daughter called in returning Christy's call

## 2023-07-27 NOTE — Telephone Encounter (Signed)
I will have to call back.I had to hang up after 5 min

## 2023-07-28 ENCOUNTER — Encounter (HOSPITAL_COMMUNITY): Payer: Self-pay

## 2023-07-28 ENCOUNTER — Ambulatory Visit (HOSPITAL_COMMUNITY)
Admission: EM | Admit: 2023-07-28 | Discharge: 2023-07-28 | Disposition: A | Discharge status: Home / Self Care | Payer: Medicare Other | Attending: Emergency Medicine | Admitting: Emergency Medicine

## 2023-07-28 DIAGNOSIS — Z113 Encounter for screening for infections with a predominantly sexual mode of transmission: Secondary | ICD-10-CM | POA: Diagnosis not present

## 2023-07-28 LAB — HEPATITIS PANEL, ACUTE
HCV Ab: NONREACTIVE
Hep A IgM: NONREACTIVE
Hep B C IgM: NONREACTIVE
Hepatitis B Surface Ag: NONREACTIVE

## 2023-07-28 LAB — HIV ANTIBODY (ROUTINE TESTING W REFLEX): HIV Screen 4th Generation wRfx: NONREACTIVE

## 2023-07-28 NOTE — Discharge Instructions (Signed)
Labs sent out today to test for gonorrhea, chlamydia, HIV, hepatitis, and syphilis. We will contact you with results.

## 2023-07-28 NOTE — ED Provider Notes (Signed)
[facility]    CSN: [id number] Arrival date & time: 07/28/23  1007      History   Chief Complaint Chief Complaint  Patient presents with   SEXUALLY TRANSMITTED DISEASE    HPI Tony Price is a 82 y.o. male.   Patient presents with his son and requests STI testing. The patient denies any symptoms including discharge, dysuria, abdominal or back pain. He denies any known or suspected exposure. He would like to get full STI testing as he states he hasn't been tested before.  The history is provided by the patient and a relative.    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   Type II diabetes mellitus (HCC) 02/03/2023    Patient Active Problem List   Diagnosis Date Noted   Hypercholesterolemia 02/03/2023   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   Type II diabetes mellitus (HCC)    Weight loss 04/28/2022   Erectile dysfunction due to arterial insufficiency 09/14/2021   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 Authorizing Provider  amLODipine (NORVASC) 10 MG tablet TAKE 1 TABLET BY MOUTH EVERY DAY 06/15/23   Georgina Quint, MD  Ascorbic Acid (VITAMIN C) 500 MG CAPS See admin instructions.    [provider]  aspirin 81 MG EC tablet 1 tablet    [provider]  atorvastatin (LIPITOR) 20 MG tablet TAKE 1 TABLET BY MOUTH EVERY DAY 06/15/23   Georgina Quint, MD  blood glucose meter kit and supplies KIT 1 touch ultra meter.   Check blood sugar once daily E11.9 07/14/17   Shade Flood, MD  Blood Glucose Monitoring Suppl (ONE TOUCH ULTRA SYSTEM KIT) w/Device KIT Test blood sugar once daily. Dx: E11.9 07/01/16   Shade Flood, MD  carboxymethylcellulose (REFRESH PLUS) 0.5 % SOLN 1 drop 2 (two) times daily as needed. Into both eyes    [provider]  donepezil (ARICEPT) 5 MG tablet Take 1 tablet (5 mg total) by mouth daily. 07/27/23   Marcos Eke, PA-C  glimepiride (AMARYL) 2 MG tablet TAKE 1 TABLET BY MOUTH TWICE A DAY 03/10/23   Georgina Quint, MD  glucose blood test strip Test blood sugar once daily. Dx: E11.9 07/04/17   Shade Flood, MD  Lancets [facility] ULTRASOFT) lancets Test blood sugar once daily. Dx: E11.9 07/04/17   Shade Flood, MD  latanoprost (XALATAN) 0.005 %  ophthalmic solution 1 drop at bedtime. 08/31/19   [provider]  memantine (NAMENDA) 10 MG tablet Take 1 tablet (10 mg total) by mouth 2 (two) times daily. 07/04/23   Marcos Eke, PA-C  sildenafil (VIAGRA) 100 MG tablet Take 0.5-1 tablets (50-100 mg total) by mouth daily as needed for erectile dysfunction. 10/20/22   Georgina Quint, MD  tamsulosin (FLOMAX) 0.4 MG CAPS capsule Take 1 capsule (0.4 mg total) by mouth daily. 01/08/23   Georgina Quint, MD  valsartan (DIOVAN) 160 MG tablet TAKE 1 TABLET BY MOUTH EVERY DAY 06/15/23   Georgina Quint, MD    Family History Family History  Problem Relation Age of Onset   Dementia Cousin        maternal; x2    Social History Social History   Tobacco Use   Smoking status: Former    Types: Cigarettes   Smokeless tobacco: Never  Substance Use Topics   Alcohol use: No    Alcohol/week: 0.0 standard drinks of alcohol   Drug use: No     Allergies   Patient has no known allergies.   Review of Systems Review of Systems  Constitutional:  Negative for fever.  Gastrointestinal:  Negative for abdominal pain.  Genitourinary:  Negative for dysuria, frequency, hematuria, penile discharge, testicular pain and urgency.  Musculoskeletal:  Negative for back pain.  Skin:  Negative for rash.     Physical Exam Triage Vital Signs ED Triage Vitals [07/28/23 1023]  Encounter Vitals Group     BP 112/62     Systolic BP Percentile      Diastolic BP Percentile      Pulse Rate 73     Resp 16     Temp 98.2 F (36.8 C)     Temp Source Oral     SpO2 97 %     Weight 145 lb (65.8 kg)     Height 5\' 6"  (1.676 m)     Head Circumference      Peak Flow      Pain Score 0     Pain Loc      Pain Education      Exclude from Growth Chart    No data found.  Updated Vital Signs BP 112/62 (BP Location: Right Arm)   Pulse 73   Temp 98.2 F (36.8 C) (Oral)   Resp 16   Ht 5\' 6"  (1.676 m)   Wt 145 lb (65.8 kg)   SpO2 97%   BMI 23.40 kg/m   Visual Acuity Right Eye Distance:   Left Eye Distance:   Bilateral Distance:    Right Eye Near:   Left Eye Near:    Bilateral Near:     Physical Exam Vitals and nursing note reviewed.  Constitutional:      General: He is not in acute distress. Cardiovascular:     Rate and Rhythm: Normal rate and regular rhythm.     Heart sounds: Normal heart sounds.  Pulmonary:     Effort: Pulmonary effort is normal.     Breath sounds: Normal breath sounds.  Abdominal:     Palpations: Abdomen is soft.     Tenderness: There is no abdominal  tenderness. There is no right CVA tenderness or left CVA tenderness.  Genitourinary:    Comments: deferred Neurological:     Mental Status: He is alert.      UC Treatments / Results  Labs (all labs ordered are listed, but only  abnormal results are displayed) Labs Reviewed  RPR  HIV ANTIBODY (ROUTINE TESTING W REFLEX)  HEPATITIS PANEL, ACUTE  CYTOLOGY, (ORAL, ANAL, URETHRAL) ANCILLARY ONLY    EKG   Radiology No results found.  Procedures Procedures (including critical care time)  Medications Ordered in UC Medications - No data to display  Initial Impression / Assessment and Plan / UC Course  I have reviewed the triage vital signs and the nursing notes.  Pertinent labs & imaging results that were available during my care of the patient were reviewed by me and considered in my medical decision making (see chart for details).     STI screening labs sent out. Pt denies sx or concerns that would indicate any empiric tx at this time.   E/M: 1 acute uncomplicated illness, 4 data (cytology, HIV, RPR, hepatitis), low risk  Final Clinical Impressions(s) / UC Diagnoses   Final diagnoses:  Screening examination for STI     Discharge Instructions      Labs sent out today to test for gonorrhea, chlamydia, HIV, hepatitis, and syphilis. We will contact you with results.     ED Prescriptions   None    PDMP not reviewed this encounter.   Estanislado Pandy, [place] 07/28/23 1055

## 2023-07-28 NOTE — ED Triage Notes (Signed)
Patient here today to be tested for all STDs. Not having any symptoms.

## 2023-07-29 LAB — CYTOLOGY, (ORAL, ANAL, URETHRAL) ANCILLARY ONLY
Chlamydia: NEGATIVE
Comment: NEGATIVE
Comment: NEGATIVE
Comment: NORMAL
Neisseria Gonorrhea: NEGATIVE
Trichomonas: NEGATIVE

## 2023-07-29 LAB — RPR: RPR Ser Ql: NONREACTIVE

## 2023-08-01 ENCOUNTER — Telehealth: Payer: Self-pay | Admitting: Emergency Medicine

## 2023-08-01 ENCOUNTER — Other Ambulatory Visit: Payer: Self-pay | Admitting: *Deleted

## 2023-08-01 MED ORDER — GLUCOSE BLOOD VI STRP: ORAL_STRIP | 3 refills | Status: DC

## 2023-08-01 NOTE — Telephone Encounter (Signed)
Prescription sent to patient pharmacy.

## 2023-08-01 NOTE — Telephone Encounter (Signed)
Prescription Request  08/01/2023  LOV: 10/20/2022  What is the name of the medication or equipment?  glucose blood test strip    Have you contacted your pharmacy to request a refill? No   Which pharmacy would you like this sent to?  [id number] [place], [place] - [address] [place] [address] Phone: [phone] Fax: [phone]    Patient notified that their request is being sent to the clinical staff for review and that they should receive a response within 2 business days.   Please advise at Mobile [phone] (mobile)

## 2023-08-17 ENCOUNTER — Encounter: Payer: Self-pay | Admitting: Emergency Medicine

## 2023-08-17 ENCOUNTER — Ambulatory Visit: Payer: Medicare Other | Admitting: Emergency Medicine

## 2023-08-17 VITALS — BP 128/86 | HR 70 | Temp 98.5°F | Ht 66.0 in | Wt 144.5 lb

## 2023-08-17 DIAGNOSIS — I152 Hypertension secondary to endocrine disorders: Secondary | ICD-10-CM

## 2023-08-17 DIAGNOSIS — E1169 Type 2 diabetes mellitus with other specified complication: Secondary | ICD-10-CM | POA: Diagnosis not present

## 2023-08-17 DIAGNOSIS — F0670 Mild neurocognitive disorder due to known physiological condition without behavioral disturbance: Secondary | ICD-10-CM

## 2023-08-17 DIAGNOSIS — Z7984 Long term (current) use of oral hypoglycemic drugs: Secondary | ICD-10-CM

## 2023-08-17 DIAGNOSIS — E1159 Type 2 diabetes mellitus with other circulatory complications: Secondary | ICD-10-CM | POA: Diagnosis not present

## 2023-08-17 DIAGNOSIS — G309 Alzheimer's disease, unspecified: Secondary | ICD-10-CM | POA: Diagnosis not present

## 2023-08-17 DIAGNOSIS — N401 Enlarged prostate with lower urinary tract symptoms: Secondary | ICD-10-CM | POA: Diagnosis not present

## 2023-08-17 DIAGNOSIS — N138 Other obstructive and reflux uropathy: Secondary | ICD-10-CM | POA: Diagnosis not present

## 2023-08-17 DIAGNOSIS — Z23 Encounter for immunization: Secondary | ICD-10-CM | POA: Diagnosis not present

## 2023-08-17 DIAGNOSIS — E785 Hyperlipidemia, unspecified: Secondary | ICD-10-CM

## 2023-08-17 LAB — COMPREHENSIVE METABOLIC PANEL WITH GFR
ALT: 9 U/L (ref 0–53)
AST: 14 U/L (ref 0–37)
Albumin: 3.8 g/dL (ref 3.5–5.2)
Alkaline Phosphatase: 58 U/L (ref 39–117)
BUN: 18 mg/dL (ref 6–23)
CO2: 30 meq/L (ref 19–32)
Calcium: 9.1 mg/dL (ref 8.4–10.5)
Chloride: 109 meq/L (ref 96–112)
Creatinine, Ser: 1.37 mg/dL (ref 0.40–1.50)
GFR: 48.26 mL/min — ABNORMAL LOW
Glucose, Bld: 126 mg/dL — ABNORMAL HIGH (ref 70–99)
Potassium: 3.9 meq/L (ref 3.5–5.1)
Sodium: 144 meq/L (ref 135–145)
Total Bilirubin: 0.3 mg/dL (ref 0.2–1.2)
Total Protein: 6.7 g/dL (ref 6.0–8.3)

## 2023-08-17 LAB — CBC WITH DIFFERENTIAL/PLATELET
Basophils Absolute: 0.1 10*3/uL (ref 0.0–0.1)
Basophils Relative: 0.7 % (ref 0.0–3.0)
Eosinophils Absolute: 0 10*3/uL (ref 0.0–0.7)
Eosinophils Relative: 0.1 % (ref 0.0–5.0)
HCT: 36.4 % — ABNORMAL LOW (ref 39.0–52.0)
Hemoglobin: 11.6 g/dL — ABNORMAL LOW (ref 13.0–17.0)
Lymphocytes Relative: 16.2 % (ref 12.0–46.0)
Lymphs Abs: 1.2 10*3/uL (ref 0.7–4.0)
MCHC: 31.8 g/dL (ref 30.0–36.0)
MCV: 84.2 fL (ref 78.0–100.0)
Monocytes Absolute: 0.3 10*3/uL (ref 0.1–1.0)
Monocytes Relative: 4.8 % (ref 3.0–12.0)
Neutro Abs: 5.7 10*3/uL (ref 1.4–7.7)
Neutrophils Relative %: 78.2 % — ABNORMAL HIGH (ref 43.0–77.0)
Platelets: 237 10*3/uL (ref 150.0–400.0)
RBC: 4.33 Mil/uL (ref 4.22–5.81)
RDW: 14.8 % (ref 11.5–15.5)
WBC: 7.3 10*3/uL (ref 4.0–10.5)

## 2023-08-17 LAB — MICROALBUMIN / CREATININE URINE RATIO
Creatinine,U: 333.8 mg/dL
Microalb Creat Ratio: 0.7 mg/g (ref 0.0–30.0)
Microalb, Ur: 2.2 mg/dL — ABNORMAL HIGH (ref 0.0–1.9)

## 2023-08-17 LAB — POCT GLYCOSYLATED HEMOGLOBIN (HGB A1C): Hemoglobin A1C: 6.2 % — AB (ref 4.0–5.6)

## 2023-08-17 LAB — LIPID PANEL
Cholesterol: 149 mg/dL (ref 0–200)
HDL: 68.1 mg/dL (ref >39.00)
LDL Cholesterol: 72 mg/dL (ref 0–99)
NonHDL: 80.6
Total CHOL/HDL Ratio: 2
Triglycerides: 45 mg/dL (ref 0.0–149.0)
VLDL: 9 mg/dL (ref 0.0–40.0)

## 2023-08-17 NOTE — Assessment & Plan Note (Signed)
Chronic stable conditions Continue daily glimepiride 2 mg twice a day and atorvastatin 20 mg daily Diet and nutrition discussed

## 2023-08-17 NOTE — Patient Instructions (Signed)
Health Maintenance After Age 82 After age 82, you are at a higher risk for certain long-term diseases and infections as well as injuries from falls. Falls are a major cause of broken bones and head injuries in people who are older than age 82. Getting regular preventive care can help to keep you healthy and well. Preventive care includes getting regular testing and making lifestyle changes as recommended by your health care provider. Talk with your health care provider about: Which screenings and tests you should have. A screening is a test that checks for a disease when you have no symptoms. A diet and exercise plan that is right for you. What should I know about screenings and tests to prevent falls? Screening and testing are the best ways to find a health problem early. Early diagnosis and treatment give you the best chance of managing medical conditions that are common after age 82. Certain conditions and lifestyle choices may make you more likely to have a fall. Your health care provider may recommend: Regular vision checks. Poor vision and conditions such as cataracts can make you more likely to have a fall. If you wear glasses, make sure to get your prescription updated if your vision changes. Medicine review. Work with your health care provider to regularly review all of the medicines you are taking, including over-the-counter medicines. Ask your health care provider about any side effects that may make you more likely to have a fall. Tell your health care provider if any medicines that you take make you feel dizzy or sleepy. Strength and balance checks. Your health care provider may recommend certain tests to check your strength and balance while standing, walking, or changing positions. Foot health exam. Foot pain and numbness, as well as not wearing proper footwear, can make you more likely to have a fall. Screenings, including: Osteoporosis screening. Osteoporosis is a condition that causes  the bones to get weaker and break more easily. Blood pressure screening. Blood pressure changes and medicines to control blood pressure can make you feel dizzy. Depression screening. You may be more likely to have a fall if you have a fear of falling, feel depressed, or feel unable to do activities that you used to do. Alcohol use screening. Using too much alcohol can affect your balance and may make you more likely to have a fall. Follow these instructions at home: Lifestyle Do not drink alcohol if: Your health care provider tells you not to drink. If you drink alcohol: Limit how much you have to: 0-1 drink a day for women. 0-2 drinks a day for men. Know how much alcohol is in your drink. In the [place] one drink equals one 12 oz bottle of beer (355 mL), one 5 oz glass of wine (148 mL), or one 1 oz glass of hard liquor (44 mL). Do not use any products that contain nicotine or tobacco. These products include cigarettes, chewing tobacco, and vaping devices, such as e-cigarettes. If you need help quitting, ask your health care provider. Activity  Follow a regular exercise program to stay fit. This will help you maintain your balance. Ask your health care provider what types of exercise are appropriate for you. If you need a cane or walker, use it as recommended by your health care provider. Wear supportive shoes that have nonskid soles. Safety  Remove any tripping hazards, such as rugs, cords, and clutter. Install safety equipment such as grab bars in bathrooms and safety rails on stairs. Keep rooms and walkways   well-lit. General instructions Talk with your health care provider about your risks for falling. Tell your health care provider if: You fall. Be sure to tell your health care provider about all falls, even ones that seem minor. You feel dizzy, tiredness (fatigue), or off-balance. Take over-the-counter and prescription medicines only as told by your health care provider. These include  supplements. Eat a healthy diet and maintain a healthy weight. A healthy diet includes low-fat dairy products, low-fat (lean) meats, and fiber from whole grains, beans, and lots of fruits and vegetables. Stay current with your vaccines. Schedule regular health, dental, and eye exams. Summary Having a healthy lifestyle and getting preventive care can help to protect your health and wellness after age 82. Screening and testing are the best way to find a health problem early and help you avoid having a fall. Early diagnosis and treatment give you the best chance for managing medical conditions that are more common for people who are older than age 82. Falls are a major cause of broken bones and head injuries in people who are older than age 82. Take precautions to prevent a fall at home. Work with your health care provider to learn what changes you can make to improve your health and wellness and to prevent falls. This information is not intended to replace advice given to you by your health care provider. Make sure you discuss any questions you have with your health care provider. Document Revised: 02/23/2021 Document Reviewed: 02/23/2021 Elsevier Patient Education  2024 [employer]  

## 2023-08-17 NOTE — Progress Notes (Signed)
Tony Price 82 y.o.   Chief Complaint  Patient presents with   Medical Management of Chronic Issues    F/u appt, patient wants to be referred to neurology for memory issues    HISTORY OF PRESENT ILLNESS: This is a 82 y.o. male accompanied by his son, here to follow-up on chronic medical conditions including diabetes hypertension and dyslipidemia History of Alzheimer's disease.  Sees neurologist on a regular basis.  Presently on Namenda and Aricept. Overall doing well.  Has no complaints or any other medical concerns today. BP Readings from Last 3 Encounters:  08/17/23 128/86  07/28/23 112/62  03/29/23 (!) 103/48   Lab Results  Component Value Date   HGBA1C 6.1 (A) 10/20/2022   Wt Readings from Last 3 Encounters:  08/17/23 144 lb 8 oz (65.5 kg)  07/28/23 145 lb (65.8 kg)  03/29/23 140 lb (63.5 kg)     HPI   Prior to Admission medications   Medication Sig Start Date End Date Taking? Authorizing Provider  amLODipine (NORVASC) 10 MG tablet TAKE 1 TABLET BY MOUTH EVERY DAY 06/15/23  Yes Samya Siciliano, Eilleen Kempf, MD  Ascorbic Acid (VITAMIN C) 500 MG CAPS See admin instructions.   Yes [provider]  aspirin 81 MG EC tablet 1 tablet   Yes [provider]  atorvastatin (LIPITOR) 20 MG tablet TAKE 1 TABLET BY MOUTH EVERY DAY 06/15/23  Yes Breunna Nordmann, Eilleen Kempf, MD  blood glucose meter kit and supplies KIT 1 touch ultra meter.   Check blood sugar once daily E11.9 07/14/17  Yes Shade Flood, MD  Blood Glucose Monitoring Suppl (ONE TOUCH ULTRA SYSTEM KIT) w/Device KIT Test blood sugar once daily. Dx: E11.9 07/01/16  Yes Shade Flood, MD  carboxymethylcellulose (REFRESH PLUS) 0.5 % SOLN 1 drop 2 (two) times daily as needed. Into both eyes   Yes [provider]  donepezil (ARICEPT) 5 MG tablet Take 1 tablet (5 mg total) by mouth daily. 07/27/23  Yes Wertman, Sung Amabile, PA-C  glimepiride (AMARYL) 2 MG tablet TAKE 1 TABLET BY MOUTH TWICE A DAY 03/10/23  Yes  Paighton Godette, Eilleen Kempf, MD  glucose blood test strip Test blood sugar once daily. Dx: E11.9 08/01/23  Yes Georgina Quint, MD  Lancets [facility] ULTRASOFT) lancets Test blood sugar once daily. Dx: E11.9 07/04/17  Yes Shade Flood, MD  latanoprost (XALATAN) 0.005 % ophthalmic solution 1 drop at bedtime. 08/31/19  Yes [provider]  memantine (NAMENDA) 10 MG tablet Take 1 tablet (10 mg total) by mouth 2 (two) times daily. 07/04/23  Yes Marcos Eke, PA-C  sildenafil (VIAGRA) 100 MG tablet Take 0.5-1 tablets (50-100 mg total) by mouth daily as needed for erectile dysfunction. 10/20/22  Yes Gaudencio Chesnut, Eilleen Kempf, MD  tamsulosin (FLOMAX) 0.4 MG CAPS capsule Take 1 capsule (0.4 mg total) by mouth daily. 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   History of colonic polyps 11/04/2020   Diabetic retinopathy (HCC) 03/31/2016   Dyslipidemia associated with type 2 diabetes mellitus (  HCC) 11/17/2014   Hypertension associated with diabetes (HCC) 11/17/2014    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   Chest pain    Diabetic retinopathy (HCC) 03/31/2016   Both eyes mild background diabetic retinopathy   Dyslipidemia associated with type 2 diabetes mellitus (HCC) 11/17/2014   Erectile dysfunction due to arterial insufficiency 09/14/2021   History of colonic polyps 11/04/2020   Hypercholesterolemia     Hypertension associated with diabetes (HCC) 11/17/2014   Kidney stones    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   Type II diabetes mellitus (HCC) 02/03/2023    Past Surgical History:  Procedure Laterality Date   CATARACT EXTRACTION     EYE SURGERY     PROSTATE BIOPSY     ROTATOR CUFF REPAIR     Right    Social History   Socioeconomic History   Marital status: Divorced    Spouse name: Not on file   Number of children: 3   Years of education: 12   Highest education level: High school graduate  Occupational History   Occupation: Retired  Tobacco Use   Smoking status: Former    Types: Cigarettes   Smokeless tobacco: Never  Substance and Sexual Activity   Alcohol use: No    Alcohol/week: 0.0 standard drinks of alcohol   Drug use: No   Sexual activity: Not Currently  Other Topics Concern   Not on file  Social History Narrative   Right handed   No caffeine   No falls   One story home   Social Determinants of Health   Financial Resource Strain: Low Risk  (07/13/2023)   Overall Financial Resource Strain (CARDIA)    Difficulty of Paying Living Expenses: Not hard at all  Food Insecurity: No Food Insecurity (07/13/2023)   Hunger Vital Sign    Worried About Running Out of Food in the Last Year: Never true    Ran Out of Food in the Last Year: Never true  Transportation Needs: No Transportation Needs (07/13/2023)   PRAPARE - Administrator, Civil Service (Medical): No    Lack of Transportation (Non-Medical): No  Physical Activity: Sufficiently Active (07/13/2023)   Exercise Vital Sign    Days of Exercise per Week: 3 days    Minutes of Exercise per Session: 50 min  Stress: No Stress Concern Present (07/13/2023)   [employer] of Occupational Health - Occupational Stress Questionnaire    Feeling of Stress : Not at all  Social Connections: Unknown (07/13/2023)   Social Connection and Isolation Panel [NHANES]    Frequency of  Communication with Friends and Family: More than three times a week    Frequency of Social Gatherings with Friends and Family: More than three times a week    Attends Religious Services: Patient unable to answer    Active Member of Clubs or Organizations: No    Attends Banker Meetings: Not on file    Marital Status: Divorced  Intimate Partner Violence: Not At Risk (07/13/2023)   Humiliation, Afraid, Rape, and Kick questionnaire    Fear of Current or Ex-Partner: No    Emotionally Abused: No    Physically Abused: No    Sexually Abused: No    Family History  Problem Relation Age of Onset   Dementia Cousin        maternal; x2     Review  of Systems  Constitutional: Negative.  Negative for chills and fever.  HENT: Negative.  Negative for congestion and sore throat.   Respiratory: Negative.  Negative for cough and shortness of breath.   Cardiovascular: Negative.  Negative for chest pain and palpitations.  Gastrointestinal:  Negative for abdominal pain, diarrhea, nausea and vomiting.  Genitourinary: Negative.  Negative for hematuria.  Skin: Negative.  Negative for rash.  Neurological: Negative.  Negative for dizziness and headaches.  All other systems reviewed and are negative.   Today's Vitals   08/17/23 1338  BP: 128/86  Pulse: 70  Temp: 98.5 F (36.9 C)  TempSrc: Oral  SpO2: 99%  Weight: 144 lb 8 oz (65.5 kg)  Height: 5\' 6"  (1.676 m)   Body mass index is 23.32 kg/m.   Physical Exam Vitals reviewed.  Constitutional:      Appearance: Normal appearance.  HENT:     Head: Normocephalic.     Mouth/Throat:     Mouth: Mucous membranes are moist.     Pharynx: Oropharynx is clear.  Eyes:     Extraocular Movements: Extraocular movements intact.     Pupils: Pupils are equal, round, and reactive to light.  Cardiovascular:     Rate and Rhythm: Normal rate and regular rhythm.     Pulses: Normal pulses.     Heart sounds: Murmur heard.  Pulmonary:     Effort:  Pulmonary effort is normal.     Breath sounds: Normal breath sounds.  Abdominal:     Palpations: Abdomen is soft.     Tenderness: There is no abdominal tenderness.  Musculoskeletal:     Cervical back: No tenderness.     Right lower leg: No edema.     Left lower leg: No edema.  Lymphadenopathy:     Cervical: No cervical adenopathy.  Skin:    General: Skin is warm and dry.  Neurological:     Mental Status: He is alert and oriented to person, place, and time.  Psychiatric:        Mood and Affect: Mood normal.        Behavior: Behavior normal.    Results for orders placed or performed in visit on 08/17/23 (from the past 24 hour(s))  POCT HgB A1C     Status: Abnormal   Collection Time: 08/17/23  2:06 PM  Result Value Ref Range   Hemoglobin A1C 6.2 (A) 4.0 - 5.6 %   HbA1c POC (<> result, manual entry)     HbA1c, POC (prediabetic range)     HbA1c, POC (controlled diabetic range)       ASSESSMENT & PLAN: A total of 44 minutes was spent with the patient and counseling/coordination of care regarding preparing for this visit, review of most recent office visit notes, review of multiple chronic medical conditions under management, review of most recent blood work results including interpretation of today's hemoglobin A1c, review of all medications, cardiovascular risks associated with hypertension and diabetes, education on nutrition, prognosis, documentation and need for follow-up.  Problem List Items Addressed This Visit       Cardiovascular and Mediastinum   Hypertension associated with diabetes (HCC) - Primary    Well-controlled diabetes and hypertension Hemoglobin A1c 6.2 Cardiovascular risks associated with both conditions discussed Diet and nutrition discussed.  Encouraged to increase daily caloric intake Continue glimepiride 2 mg twice a day, amlodipine 10 mg daily and valsartan 160 mg daily Blood work done today including urine for microalbumin Recommend follow-up in 6  months  Relevant Orders   Urine Microalbumin w/creat. ratio   CBC with Differential/Platelet   Comprehensive metabolic panel   Lipid panel     Endocrine   Dyslipidemia associated with type 2 diabetes mellitus (HCC)    Chronic stable conditions Continue daily glimepiride 2 mg twice a day and atorvastatin 20 mg daily Diet and nutrition discussed      Relevant Orders   Urine Microalbumin w/creat. 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  Continues Flomax 0.4 mg daily      Other Visit Diagnoses     Need for vaccination       Relevant Orders   Flu Vaccine Trivalent High Dose (Fluad)      Patient Instructions  Health Maintenance After Age 59 After age 70, you are at a higher risk for certain long-term diseases and infections as well as injuries from falls. Falls are a major cause of broken bones and head injuries in people who are older than age 31. Getting regular preventive care can help to keep you healthy and well. Preventive care includes getting regular testing and making lifestyle changes as recommended by your health care provider. Talk with your health care provider about: Which screenings and tests you should have. A screening is a test that checks for a disease when you have no symptoms. A diet and exercise plan that is right for you. What should I know about screenings and tests to prevent falls? Screening and testing are the best ways to find a health problem early. Early diagnosis and treatment give you the best chance of managing medical conditions that are common after age 6. Certain conditions and lifestyle  choices may make you more likely to have a fall. Your health care provider may recommend: Regular vision checks. Poor vision and conditions such as cataracts can make you more likely to have a fall. If you wear glasses, make sure to get your prescription updated if your vision changes. Medicine review. Work with your health care provider to regularly review all of the medicines you are taking, including over-the-counter medicines. Ask your health care provider about any side effects that may make you more likely to have a fall. Tell your health care provider if any medicines that you take make you feel dizzy or sleepy. Strength and balance checks. Your health care provider may recommend certain tests to check your strength and balance while standing, walking, or changing positions. Foot health exam. Foot pain and numbness, as well as not wearing proper footwear, can make you more likely to have a fall. Screenings, including: Osteoporosis screening. Osteoporosis is a condition that causes the bones to get weaker and break more easily. Blood pressure screening. Blood pressure changes and medicines to control blood pressure can make you feel dizzy. Depression screening. You may be more likely to have a fall if you have a fear of falling, feel depressed, or feel unable to do activities that you used to do. Alcohol use screening. Using too much alcohol can affect your balance and may make you more likely to have a fall. Follow these instructions at home: Lifestyle Do  not drink alcohol if: Your health care provider tells you not to drink. If you drink alcohol: Limit how much you have to: 0-1 drink a day for women. 0-2 drinks a day for men. Know how much alcohol is in your drink. In the [place] one drink equals one 12 oz bottle of beer (355 mL), one 5 oz glass of wine (148 mL), or one 1 oz glass of hard liquor (44 mL). Do not use any products that contain nicotine or tobacco. These products include  cigarettes, chewing tobacco, and vaping devices, such as e-cigarettes. If you need help quitting, ask your health care provider. Activity  Follow a regular exercise program to stay fit. This will help you maintain your balance. Ask your health care provider what types of exercise are appropriate for you. If you need a cane or walker, use it as recommended by your health care provider. Wear supportive shoes that have nonskid soles. Safety  Remove any tripping hazards, such as rugs, cords, and clutter. Install safety equipment such as grab bars in bathrooms and safety rails on stairs. Keep rooms and walkways well-lit. General instructions Talk with your health care provider about your risks for falling. Tell your health care provider if: You fall. Be sure to tell your health care provider about all falls, even ones that seem minor. You feel dizzy, tiredness (fatigue), or off-balance. Take over-the-counter and prescription medicines only as told by your health care provider. These include supplements. Eat a healthy diet and maintain a healthy weight. A healthy diet includes low-fat dairy products, low-fat (lean) meats, and fiber from whole grains, beans, and lots of fruits and vegetables. Stay current with your vaccines. Schedule regular health, dental, and eye exams. Summary Having a healthy lifestyle and getting preventive care can help to protect your health and wellness after age 30. Screening and testing are the best way to find a health problem early and help you avoid having a fall. Early diagnosis and treatment give you the best chance for managing medical conditions that are more common for people who are older than age 53. Falls are a major cause of broken bones and head injuries in people who are older than age 11. Take precautions to prevent a fall at home. Work with your health care provider to learn what changes you can make to improve your health and wellness and to prevent  falls. This information is not intended to replace advice given to you by your health care provider. Make sure you discuss any questions you have with your health care provider. Document Revised: 02/23/2021 Document Reviewed: 02/23/2021 Elsevier Patient Education  2024 Elsevier Inc.      Edwina Barth, MD [facility]

## 2023-08-17 NOTE — Assessment & Plan Note (Signed)
Well-controlled diabetes and hypertension Hemoglobin A1c 6.2 Cardiovascular risks associated with both conditions discussed Diet and nutrition discussed.  Encouraged to increase daily caloric intake Continue glimepiride 2 mg twice a day, amlodipine 10 mg daily and valsartan 160 mg daily Blood work done today including urine for microalbumin Recommend follow-up in 6 months

## 2023-08-17 NOTE — Assessment & Plan Note (Signed)
Chronic stable condition Sees neurologist on a regular basis Continue Aricept and Namenda as instructed by her office

## 2023-08-17 NOTE — Assessment & Plan Note (Signed)
Asymptomatic.  Continues Flomax 0.4 mg daily

## 2023-09-05 ENCOUNTER — Encounter (INDEPENDENT_AMBULATORY_CARE_PROVIDER_SITE_OTHER): Payer: Medicare Other | Admitting: Ophthalmology

## 2023-09-05 DIAGNOSIS — H35033 Hypertensive retinopathy, bilateral: Secondary | ICD-10-CM | POA: Diagnosis not present

## 2023-09-05 DIAGNOSIS — H35371 Puckering of macula, right eye: Secondary | ICD-10-CM | POA: Diagnosis not present

## 2023-09-05 DIAGNOSIS — H43813 Vitreous degeneration, bilateral: Secondary | ICD-10-CM | POA: Diagnosis not present

## 2023-09-05 DIAGNOSIS — H35341 Macular cyst, hole, or pseudohole, right eye: Secondary | ICD-10-CM | POA: Diagnosis not present

## 2023-09-05 DIAGNOSIS — I10 Essential (primary) hypertension: Secondary | ICD-10-CM

## 2023-09-19 ENCOUNTER — Encounter: Payer: Self-pay | Admitting: Physician Assistant

## 2023-09-20 ENCOUNTER — Other Ambulatory Visit: Payer: Self-pay | Admitting: Physician Assistant

## 2023-09-20 MED ORDER — ESCITALOPRAM OXALATE 5 MG PO TABS: 5.0000 mg | ORAL_TABLET | Freq: Every day | ORAL | 3 refills | Status: DC

## 2023-09-28 ENCOUNTER — Encounter: Payer: Self-pay | Admitting: Physician Assistant

## 2023-09-28 ENCOUNTER — Ambulatory Visit (INDEPENDENT_AMBULATORY_CARE_PROVIDER_SITE_OTHER): Payer: Medicare Other | Admitting: Physician Assistant

## 2023-09-28 VITALS — BP 110/64 | HR 70 | Resp 20 | Ht 66.0 in | Wt 145.0 lb

## 2023-09-28 DIAGNOSIS — G309 Alzheimer's disease, unspecified: Secondary | ICD-10-CM | POA: Diagnosis not present

## 2023-09-28 DIAGNOSIS — F0670 Mild neurocognitive disorder due to known physiological condition without behavioral disturbance: Secondary | ICD-10-CM | POA: Diagnosis not present

## 2023-09-28 DIAGNOSIS — R413 Other amnesia: Secondary | ICD-10-CM

## 2023-09-28 MED ORDER — MEMANTINE HCL 10 MG PO TABS: 10.0000 mg | ORAL_TABLET | Freq: Two times a day (BID) | ORAL | 3 refills | Status: DC

## 2023-09-28 MED ORDER — DONEPEZIL HCL 10 MG PO TABS: 10.0000 mg | ORAL_TABLET | Freq: Every day | ORAL | 11 refills | Status: DC

## 2023-09-28 NOTE — Patient Instructions (Addendum)
It was a pleasure to see you today at our office. You are doing well!!!   Recommendations:   Increase Memantine  to 10 mg tablets twice daily for better coverage  Increase donepezil to 10 mg daily  Repeat the neurocognitive exam next year  Follow up in 6 month   Whom to call:  Memory  decline, memory medications: Call our office [phone]   For psychiatric meds, mood meds: Please have your primary care physician manage these medications.   Counseling regarding caregiver distress, including caregiver depression, anxiety and issues regarding community resources, adult day care programs, adult living facilities, or memory care questions:   Feel free to contact Misty Lisabeth Register, Social Worker at [phone]   For assessment of decision of mental capacity and competency:  Call Dr. Erick Blinks, geriatric psychiatrist at [phone]  For guidance in geriatric dementia issues please call [facility] [phone]  For guidance regarding [facility] and if placement were needed at the facility, contact Sidney Ace, Social Worker tel: [phone]  If you have any severe symptoms of a stroke, or other severe issues such as confusion,severe chills or fever, etc call 911 or go to the ER as you may need to be evaluated further         RECOMMENDATIONS FOR ALL PATIENTS WITH MEMORY PROBLEMS: 1. Continue to exercise (Recommend 30 minutes of walking everyday, or 3 hours every week) 2. Increase social interactions - continue going to [place] and enjoy social gatherings with friends and family 3. Eat healthy, avoid fried foods and eat more fruits and vegetables 4. Maintain adequate blood pressure, blood sugar, and blood cholesterol level. Reducing the risk of stroke and cardiovascular disease also helps promoting better memory. 5. Avoid stressful situations. Live a simple life and avoid aggravations. Organize your time and prepare for the next day in  anticipation. 6. Sleep well, avoid any interruptions of sleep and avoid any distractions in the bedroom that may interfere with adequate sleep quality 7. Avoid sugar, avoid sweets as there is a strong link between excessive sugar intake, diabetes, and cognitive impairment We discussed the Mediterranean diet, which has been shown to help patients reduce the risk of progressive memory disorders and reduces cardiovascular risk. This includes eating fish, eat fruits and green leafy vegetables, nuts like almonds and hazelnuts, walnuts, and also use olive oil. Avoid fast foods and fried foods as much as possible. Avoid sweets and sugar as sugar use has been linked to worsening of memory function.  There is always a concern of gradual progression of memory problems. If this is the case, then we may need to adjust level of care according to patient needs. Support, both to the patient and caregiver, should then be put into place.      FALL PRECAUTIONS: Be cautious when walking. Scan the area for obstacles that may increase the risk of trips and falls. When getting up in the mornings, sit up at the edge of the bed for a few minutes before getting out of bed. Consider elevating the bed at the head end to avoid drop of blood pressure when getting up. Walk always in a well-lit room (use night lights in the walls). Avoid area rugs or power cords from appliances in the middle of the walkways. Use a walker or a cane if necessary and consider physical therapy for balance exercise. Get your eyesight checked regularly.  FINANCIAL OVERSIGHT: Supervision, especially oversight when making financial decisions or transactions is also recommended.  HOME SAFETY: Consider the safety of the kitchen when operating appliances like stoves, microwave oven, and blender. Consider having supervision and share cooking responsibilities until no longer able to participate in those. Accidents with firearms and other hazards in the house  should be identified and addressed as well.   ABILITY TO BE LEFT ALONE: If patient is unable to contact 911 operator, consider using LifeLine, or when the need is there, arrange for someone to stay with patients. Smoking is a fire hazard, consider supervision or cessation. Risk of wandering should be assessed by caregiver and if detected at any point, supervision and safe proof recommendations should be instituted.  MEDICATION SUPERVISION: Inability to self-administer medication needs to be constantly addressed. Implement a mechanism to ensure safe administration of the medications.   DRIVING: Regarding driving, in patients with progressive memory problems, driving will be impaired. We advise to have someone else do the driving if trouble finding directions or if minor accidents are reported. Independent driving assessment is available to determine safety of driving.   If you are interested in the driving assessment, you can contact the following:  [employer] in [place] [phone]  [facility] [phone]  [facility] [phone] or [phone]    Mediterranean Diet A Mediterranean diet refers to food and lifestyle choices that are based on the traditions of countries located on the [employer]. This way of eating has been shown to help prevent certain conditions and improve outcomes for people who have chronic diseases, like kidney disease and heart disease. What are tips for following this plan? Lifestyle  Cook and eat meals together with your family, when possible. Drink enough fluid to keep your urine clear or pale yellow. Be physically active every day. This includes: Aerobic exercise like running or swimming. Leisure activities like gardening, walking, or housework. Get 7-8 hours of sleep each night. If recommended by your health care provider, drink red wine in moderation. This means 1  glass a day for nonpregnant women and 2 glasses a day for men. A glass of wine equals 5 oz (150 mL). Reading food labels  Check the serving size of packaged foods. For foods such as rice and pasta, the serving size refers to the amount of cooked product, not dry. Check the total fat in packaged foods. Avoid foods that have saturated fat or trans fats. Check the ingredients list for added sugars, such as corn syrup. Shopping  At the grocery store, buy most of your food from the areas near the walls of the store. This includes: Fresh fruits and vegetables (produce). Grains, beans, nuts, and seeds. Some of these may be available in unpackaged forms or large amounts (in bulk). Fresh seafood. Poultry and eggs. Low-fat dairy products. Buy whole ingredients instead of prepackaged foods. Buy fresh fruits and vegetables in-season from local farmers markets. Buy frozen fruits and vegetables in resealable bags. If you do not have access to quality fresh seafood, buy precooked frozen shrimp or canned fish, such as tuna, salmon, or sardines. Buy small amounts of raw or cooked vegetables, salads, or olives from the deli or salad bar at your store. Stock your pantry so you always have certain foods on hand, such as olive oil, canned tuna, canned tomatoes, rice, pasta, and beans. Cooking  Cook foods with extra-virgin olive oil instead of using butter or other vegetable oils. Have meat as a side dish, and have vegetables or grains as your main dish. This means  having meat in small portions or adding small amounts of meat to foods like pasta or stew. Use beans or vegetables instead of meat in common dishes like chili or lasagna. Experiment with different cooking methods. Try roasting or broiling vegetables instead of steaming or sauteing them. Add frozen vegetables to soups, stews, pasta, or rice. Add nuts or seeds for added healthy fat at each meal. You can add these to yogurt, salads, or vegetable  dishes. Marinate fish or vegetables using olive oil, lemon juice, garlic, and fresh herbs. Meal planning  Plan to eat 1 vegetarian meal one day each week. Try to work up to 2 vegetarian meals, if possible. Eat seafood 2 or more times a week. Have healthy snacks readily available, such as: Vegetable sticks with hummus. Greek yogurt. Fruit and nut trail mix. Eat balanced meals throughout the week. This includes: Fruit: 2-3 servings a day Vegetables: 4-5 servings a day Low-fat dairy: 2 servings a day Fish, poultry, or lean meat: 1 serving a day Beans and legumes: 2 or more servings a week Nuts and seeds: 1-2 servings a day Whole grains: 6-8 servings a day Extra-virgin olive oil: 3-4 servings a day Limit red meat and sweets to only a few servings a month What are my food choices? Mediterranean diet Recommended Grains: Whole-grain pasta. Brown rice. Bulgar wheat. Polenta. Couscous. Whole-wheat bread. Orpah Cobb. Vegetables: Artichokes. Beets. Broccoli. Cabbage. Carrots. Eggplant. Green beans. Chard. Kale. Spinach. Onions. Leeks. Peas. Squash. Tomatoes. Peppers. Radishes. Fruits: Apples. Apricots. Avocado. Berries. Bananas. Cherries. Dates. Figs. Grapes. Lemons. Melon. Oranges. Peaches. Plums. Pomegranate. Meats and other protein foods: Beans. Almonds. Sunflower seeds. Pine nuts. Peanuts. Cod. Salmon. Scallops. Shrimp. Tuna. Tilapia. Clams. Oysters. Eggs. Dairy: Low-fat milk. Cheese. Greek yogurt. Beverages: Water. Red wine. Herbal tea. Fats and oils: Extra virgin olive oil. Avocado oil. Grape seed oil. Sweets and desserts: [place] yogurt with honey. Baked apples. Poached pears. Trail mix. Seasoning and other foods: Basil. Cilantro. Coriander. Cumin. Mint. Parsley. Sage. Rosemary. Tarragon. Garlic. Oregano. Thyme. Pepper. Balsalmic vinegar. Tahini. Hummus. Tomato sauce. Olives. Mushrooms. Limit these Grains: Prepackaged pasta or rice dishes. Prepackaged cereal with added  sugar. Vegetables: Deep fried potatoes (french fries). Fruits: Fruit canned in syrup. Meats and other protein foods: Beef. Pork. Lamb. Poultry with skin. Hot dogs. Tomasa Blase. Dairy: Ice cream. Sour cream. Whole milk. Beverages: Juice. Sugar-sweetened soft drinks. Beer. Liquor and spirits. Fats and oils: Butter. Canola oil. Vegetable oil. Beef fat (tallow). Lard. Sweets and desserts: Cookies. Cakes. Pies. Candy. Seasoning and other foods: Mayonnaise. Premade sauces and marinades. The items listed may not be a complete list. Talk with your dietitian about what dietary choices are right for you. Summary The Mediterranean diet includes both food and lifestyle choices. Eat a variety of fresh fruits and vegetables, beans, nuts, seeds, and whole grains. Limit the amount of red meat and sweets that you eat. Talk with your health care provider about whether it is safe for you to drink red wine in moderation. This means 1 glass a day for nonpregnant women and 2 glasses a day for men. A glass of wine equals 5 oz (150 mL). This information is not intended to replace advice given to you by your health care provider. Make sure you discuss any questions you have with your health care provider. Document Released: 05/27/2016 Document Revised: 06/29/2016 Document Reviewed: 05/27/2016 Elsevier Interactive Patient Education  2017 [employer].   We have sent a referral to [facility] for your MRI and they will call you  directly to schedule your appointment. They are located at [address] [facility]. If you need to contact them directly please call [phone].  Your provider has requested that you have labwork completed today. Please go to [facility] ([id number]) on the second floor of this building before leaving the office today. You do not need to check in. If you are not called within 15 minutes please check with the front desk.

## 2023-09-28 NOTE — Progress Notes (Signed)
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 RH male with a history of hypertension, hyperlipidemia, DM2, diabetic retinopathy  presenting today in follow-up for evaluation of memory loss. Patient is on memantine 10 mg twice daily and donepezil 5 mg daily.  MMSE today is 24/30, slight decline from prior (26/30).Discussed increasing donepezil to 10 mg daily for better coverage, and he agreed to proceed.  He is able to participate on his ADLs and to drive without difficulty.     Recommendations:   Follow up in 6 months. Continue donepezil increased to 10 mg daily and continue memantine 10 mg twice daily Repeat neuropsych evaluation for disease trajectory   Recommend checking hearing with audiology Recommend good control of cardiovascular risk factors Continue to control mood as per PCP    Subjective:   This patient is accompanied in the office by his son who supplements the history. Previous records as well as any outside records available were reviewed prior to todays visit.   Patient was last seen on 03/29/2023 with MMSE 26/30    Any changes in memory since last visit? " Has changed dramatically"-son says. He has more difficulty  recent conversations, but not so good with names ".  He likes going to church, go watching TV, does not like doing any crossword puzzles or word finding.  repeats oneself?  Endorsed, more frequently than before  Disoriented when walking into a room?  Patient denies    Misplacing objects?  Patient denies   Wandering behavior?   Denies.   Any personality changes since last visit?   Denies.   Any worsening depression?: denies.  He has some level of stress due  to issues with his daughter and granddaughter Hallucinations or paranoia?  Denies.   Seizures?   Denies.    Any sleep changes? Sleeps well..  Denies vivid dreams, REM behavior or sleepwalking   Sleep  apnea?   denies .  Any hygiene concerns?   Denies.   Independent of bathing and dressing?  Endorsed  Does the patient needs help with medications? Patient is in charge   Who is in charge of the finances?  Patient is in charge     Any changes in appetite?  denies      Patient have trouble swallowing?. Eats well.    Does the patient cook? Yes Any kitchen accidents such as leaving the stove on?   denies   Any headaches?    Denies.   Vision changes? Denies. Chronic pain?  Denies.   Ambulates with difficulty?  Denies.   Recent falls or head injuries?    denies      Unilateral weakness, numbness or tingling?   Denies.   Any tremors?  denies   Any anosmia?    Denies.   Any incontinence of urine?  He has BPH, on Flomax Any bowel dysfunction?  denies      Patient lives with wife, daughter and granddaughter.  Does the patient drive?  Yes, denies any issues    Neuropsych evaluation 02/03/23 Briefly, results suggested severe impairment surrounding all aspects of learning and memory. Additional impairments were exhibited across executive functioning and semantic fluency. Performance variability was further exhibited across basic attention, confrontation naming, and visuospatial abilities. Regarding etiology, I have concerns for the presence of an underlying neurodegenerative illness, namely Alzheimer's disease. Across memory testing, he did not benefit from repeated exposure to  information across learning trials, was fully amnestic (i.e., 0% retention) across all memory tasks after a brief delay, and performed poorly across yes/no recognition trials. 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 Impairments and/or weakness surrounding semantic fluency and confrontation naming would follow typical disease progression. There could be a vascular contribution given recent neuroimaging suggesting moderate  microvascular ischemic disease. However, this would not explain amnestic memory and other patterns of performance in isolation. Overall, underlying Alzheimer's disease, potentially made worse by his cerebrovascular health, appears the most likely cause for ongoing memory impairment based upon currently available data. Continued medical monitoring will be important moving forward.     MRI brain 05/30/22 personally reviewed was remarkable for mild age related volume loss and moderate chronic small-vessel ischemic changes of the pons, thalami and hemispheric white matter  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   aspirin 81 MG EC tablet 1 tablet   atorvastatin (LIPITOR) 20 MG tablet TAKE 1 TABLET BY MOUTH EVERY DAY   blood glucose meter kit and supplies KIT 1 touch ultra meter.   Check blood sugar once daily E11.9   Blood Glucose Monitoring Suppl (ONE TOUCH ULTRA SYSTEM KIT) w/Device KIT Test blood  sugar once daily. Dx: E11.9   carboxymethylcellulose (REFRESH PLUS) 0.5 % SOLN 1 drop 2 (two) times daily as needed. Into both eyes   escitalopram (LEXAPRO) 5 MG tablet Take 1 tablet (5 mg total) by mouth daily.   glimepiride (AMARYL) 2 MG tablet TAKE 1 TABLET BY MOUTH TWICE A DAY   glucose blood test strip Test blood sugar once daily. Dx: E11.9   Lancets (ONETOUCH ULTRASOFT) lancets Test blood sugar once daily. Dx: E11.9   latanoprost (XALATAN) 0.005 % ophthalmic solution 1 drop at bedtime.   sildenafil (VIAGRA) 100 MG tablet Take 0.5-1 tablets (50-100 mg total) by mouth daily as needed for erectile dysfunction.   tamsulosin (FLOMAX) 0.4 MG CAPS capsule Take 1 capsule (0.4 mg total) by mouth daily.   valsartan (DIOVAN) 160 MG tablet TAKE 1 TABLET BY MOUTH EVERY DAY   [DISCONTINUED] donepezil (ARICEPT) 5 MG tablet Take 1 tablet (5 mg total) by mouth daily.   [DISCONTINUED] memantine (NAMENDA) 10 MG tablet Take 1 tablet (10 mg total) by mouth 2 (two) times daily.   donepezil (ARICEPT) 10 MG tablet Take 1 tablet (10 mg total) by  mouth daily.   memantine (NAMENDA) 10 MG tablet Take 1 tablet (10 mg total) by mouth 2 (two) times daily.   No facility-administered encounter medications on file as of 09/28/2023.     Objective:     PHYSICAL EXAMINATION:    VITALS:   Vitals:   09/28/23 1436  BP: 110/64  Pulse: 70  Resp: 20  SpO2: 100%  Weight: 145 lb (65.8 kg)  Height: 5\' 6"  (1.676 m)    GEN:  The patient appears stated age and is in NAD. HEENT:  Normocephalic, atraumatic.   Neurological examination:  General: NAD, well-groomed, appears stated age. Orientation: The patient is alert. Oriented to person, place and not to date Cranial nerves: There is good facial symmetry.The speech is fluent and clear. No aphasia or dysarthria. Fund of knowledge is appropriate. Recent memory impaired and remote memory is normal.  Attention and concentration are normal.  Able to name objects and repeat  phrases.  Hearing is intact to conversational tone .   Delayed recall 0/3 Sensation: Sensation is intact to light touch throughout Motor: Strength is at least antigravity x4. DTR's 2/4 in UE/LE      05/17/2022    3:00 PM  Montreal Cognitive Assessment   Attention: Read list of digits (0/2) 2  Attention: Read list of letters (0/1) 1  Attention: Serial 7 subtraction starting at 100 (0/3) 3  Language: Repeat phrase (0/2) 0  Language : Fluency (0/1) 0  Abstraction (0/2) 0  Delayed Recall (0/5) 0  Orientation (0/6) 6       09/28/2023    3:00 PM 03/29/2023    3:00 PM 08/24/2022    2:00 PM  MMSE - Mini Mental State Exam  Orientation to time 4 5 3   Orientation to Place 4 4 5   Registration 3 3 3   Attention/ Calculation 5 5 5   Recall 0 0 0  Language- name 2 objects 2 2 2   Language- repeat 1 1 1   Language- follow 3 step command 3 3 3   Language- read & follow direction 1 1 1   Write a sentence 1 1 1   Copy design 0 1 0  Total score 24 26 24        Movement examination: Tone: There is normal tone in the UE/LE Abnormal movements:  no tremor.  No myoclonus.  No asterixis.   Coordination:  There is no decremation with RAM's. Normal finger to nose  Gait and Station: The patient has no difficulty arising out of a deep-seated chair without the use of the hands. The patient's stride length is good.  Gait is cautious and narrow.   Thank you for allowing [place] the opportunity to participate in the care of this nice patient. Please do not hesitate to contact us for any questions or concerns.   Total time spent on today's visit was 42 minutes dedicated to this patient today, preparing to see patient, examining the patient, ordering tests and/or medications and counseling the patient, documenting clinical information in the EHR or other health record, independently interpreting results and communicating results to the patient/family, discussing treatment and goals, answering patient's questions and  coordinating care.  Cc:  Georgina Quint, MD  Marlowe Kays 09/28/2023 3:29 PM

## 2023-10-05 ENCOUNTER — Telehealth: Payer: Self-pay | Admitting: Physician Assistant

## 2023-10-05 NOTE — Telephone Encounter (Signed)
Talked with pt son and informed him of meds that he needed to know which one was increased . He understood.

## 2023-10-05 NOTE — Telephone Encounter (Signed)
The patients daughter wants to speak with someone about her fathers medicine. She wants to make sure he is taking the correct medication sara put him on. Wants to make sure [facility] isnt filling the wrong medicine.

## 2023-10-05 NOTE — Telephone Encounter (Signed)
Patient son wants to speak to someone about the medication that Huntley Dec changed the dosage. He is not sure which medication it was

## 2023-10-07 NOTE — Telephone Encounter (Signed)
Patients daughter would like for you to send a mychart message to her about her fathers medicine. She needs to know which medicine was removed and replaced. She is a bit confused and needs to know what needs to be taken in the evening. Renee called and spoke with son but it needs to be told to his daughter

## 2023-10-10 ENCOUNTER — Institutional Professional Consult (permissible substitution): Payer: Medicare Other | Admitting: Psychology

## 2023-10-10 ENCOUNTER — Ambulatory Visit: Payer: Medicare Other

## 2023-10-12 ENCOUNTER — Other Ambulatory Visit: Payer: Self-pay | Admitting: Physician Assistant

## 2023-10-17 ENCOUNTER — Encounter: Payer: Medicare Other | Admitting: Psychology

## 2023-11-11 DIAGNOSIS — K648 Other hemorrhoids: Secondary | ICD-10-CM | POA: Diagnosis not present

## 2023-11-11 DIAGNOSIS — Z09 Encounter for follow-up examination after completed treatment for conditions other than malignant neoplasm: Secondary | ICD-10-CM | POA: Diagnosis not present

## 2023-11-11 DIAGNOSIS — D123 Benign neoplasm of transverse colon: Secondary | ICD-10-CM | POA: Diagnosis not present

## 2023-11-11 DIAGNOSIS — Z860100 Personal history of colon polyps, unspecified: Secondary | ICD-10-CM | POA: Diagnosis not present

## 2023-11-15 DIAGNOSIS — D123 Benign neoplasm of transverse colon: Secondary | ICD-10-CM | POA: Diagnosis not present

## 2023-11-30 DIAGNOSIS — R972 Elevated prostate specific antigen [PSA]: Secondary | ICD-10-CM | POA: Diagnosis not present

## 2023-12-01 DIAGNOSIS — H402231 Chronic angle-closure glaucoma, bilateral, mild stage: Secondary | ICD-10-CM | POA: Diagnosis not present

## 2023-12-01 LAB — HM DIABETES EYE EXAM

## 2023-12-07 DIAGNOSIS — R972 Elevated prostate specific antigen [PSA]: Secondary | ICD-10-CM | POA: Diagnosis not present

## 2023-12-07 DIAGNOSIS — N401 Enlarged prostate with lower urinary tract symptoms: Secondary | ICD-10-CM | POA: Diagnosis not present

## 2023-12-07 DIAGNOSIS — R351 Nocturia: Secondary | ICD-10-CM | POA: Diagnosis not present

## 2023-12-17 ENCOUNTER — Ambulatory Visit
Admission: EM | Admit: 2023-12-17 | Discharge: 2023-12-17 | Disposition: A | Discharge status: Home / Self Care | Attending: Family Medicine | Admitting: Family Medicine

## 2023-12-17 DIAGNOSIS — B349 Viral infection, unspecified: Secondary | ICD-10-CM | POA: Diagnosis not present

## 2023-12-17 LAB — POC COVID19/FLU A&B COMBO
Covid Antigen, POC: NEGATIVE
Influenza A Antigen, POC: NEGATIVE
Influenza B Antigen, POC: NEGATIVE

## 2023-12-17 MED ORDER — ACETAMINOPHEN 325 MG PO TABS
650.0000 mg | ORAL_TABLET | Freq: Four times a day (QID) | ORAL | 0 refills | Status: DC | PRN
Start: 2023-12-17 — End: 2024-03-28

## 2023-12-17 MED ORDER — PROMETHAZINE-DM 6.25-15 MG/5ML PO SYRP: 2.5000 mL | ORAL_SOLUTION | Freq: Three times a day (TID) | ORAL | 0 refills | Status: DC | PRN

## 2023-12-17 MED ORDER — CETIRIZINE HCL 1 MG/ML PO SOLN: 5.0000 mg | Freq: Every day | ORAL | 0 refills | Status: DC

## 2023-12-17 MED ORDER — CETIRIZINE HCL 10 MG PO TABS
10.0000 mg | ORAL_TABLET | Freq: Every day | ORAL | 0 refills | Status: DC
Start: 2023-12-17 — End: 2023-12-17

## 2023-12-17 NOTE — Discharge Instructions (Signed)
 Will call with your test results later today. We will manage this as a viral syndrome. For sore throat or cough try using a honey-based tea. Use 3 teaspoons of honey with juice squeezed from half lemon. Place shaved pieces of ginger into 1/2-1 cup of water and warm over stove top. Then mix the ingredients and repeat every 4 hours as needed. Please take Tylenol 500mg -650mg  once every 6 hours for fevers, aches and pains. Hydrate very well with at least 2 liters (64 ounces) of water. Eat light meals such as soups (chicken and noodles, chicken wild rice, vegetable).  Do not eat any foods that you are allergic to.  Start an antihistamine like Zyrtec (5mg  daily) for postnasal drainage, sinus congestion.  You can take this together with cough medication as needed.

## 2023-12-17 NOTE — ED Triage Notes (Signed)
 Patient presents with cough x day 3. Treated with Nyquil, helped him sleep, cough did not go away.

## 2023-12-17 NOTE — ED Provider Notes (Signed)
 [facility]  Note:  This document was prepared using Conservation officer, historic buildings and may include unintentional dictation errors.  MRN: [id number] DOB: [birth year]  Subjective:   Tony Price is a 83 y.o. male presenting for 3-day history of coughing, drainage.  No chest pain, shortness of breath or wheezing.  No asthma.  No smoking of any kind including cigarettes, cigars, vaping, marijuana use.  Would like a COVID and flu test.  No current facility-administered medications for this encounter.  Current Outpatient Medications:    amLODipine (NORVASC) 10 MG tablet, TAKE 1 TABLET BY MOUTH EVERY DAY, Disp: 90 tablet, Rfl: 1   Ascorbic Acid (VITAMIN C) 500 MG CAPS, See admin instructions., Disp: , Rfl:    aspirin 81 MG EC tablet, 1 tablet, Disp: , Rfl:    atorvastatin (LIPITOR) 20 MG tablet, TAKE 1 TABLET BY MOUTH EVERY DAY, Disp: 90 tablet, Rfl: 1   blood glucose meter kit and supplies KIT, 1 touch ultra meter.   Check blood sugar once daily E11.9, Disp: 1 each, Rfl: 0   Blood Glucose Monitoring Suppl (ONE TOUCH ULTRA SYSTEM KIT) w/Device KIT, Test blood sugar once daily. Dx: E11.9, Disp: 1 each, Rfl: 0   carboxymethylcellulose (REFRESH PLUS) 0.5 % SOLN, 1 drop 2 (two) times daily as needed. Into both eyes, Disp: , Rfl:    donepezil (ARICEPT) 10 MG tablet, Take 1 tablet (10 mg total) by mouth daily., Disp: 30 tablet, Rfl: 11   escitalopram (LEXAPRO) 5 MG tablet, TAKE 1 TABLET (5 MG TOTAL) BY MOUTH DAILY., Disp: 90 tablet, Rfl: 2   glimepiride (AMARYL) 2 MG tablet, TAKE 1 TABLET BY MOUTH TWICE A DAY, Disp: 180 tablet, Rfl: 1   glucose blood test strip, Test blood sugar once daily. Dx: E11.9, Disp: 100 each, Rfl: 3   Lancets (ONETOUCH ULTRASOFT) lancets, Test blood sugar once daily. Dx: E11.9, Disp: 100 each, Rfl: 3   latanoprost (XALATAN) 0.005 % ophthalmic solution, 1 drop at bedtime., Disp: , Rfl:    memantine (NAMENDA) 10 MG tablet, Take 1 tablet (10 mg  total) by mouth 2 (two) times daily., Disp: 180 tablet, Rfl: 3   sildenafil (VIAGRA) 100 MG tablet, Take 0.5-1 tablets (50-100 mg total) by mouth daily as needed for erectile dysfunction., Disp: 5 tablet, Rfl: 11   tamsulosin (FLOMAX) 0.4 MG CAPS capsule, Take 1 capsule (0.4 mg total) by mouth daily., Disp: 90 capsule, Rfl: 3   valsartan (DIOVAN) 160 MG tablet, TAKE 1 TABLET BY MOUTH EVERY DAY, Disp: 90 tablet, Rfl: 3   No Known Allergies  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   Type II diabetes mellitus (HCC) 02/03/2023     Past Surgical History:  Procedure Laterality Date   CATARACT EXTRACTION     EYE SURGERY     PROSTATE BIOPSY     ROTATOR CUFF REPAIR     Right    Family History  Problem Relation Age of Onset  Dementia Cousin        maternal; x2    Social History   Tobacco Use   Smoking status: Former    Types: Cigarettes   Smokeless tobacco: Never  Substance Use Topics   Alcohol use: No    Alcohol/week: 0.0 standard drinks of alcohol   Drug use: No    ROS   Objective:   Vitals: BP 111/63 (BP Location: Left Arm)   Pulse 60   Temp 99.4 F (37.4 C) (Oral)   Ht 5\' 6"  (1.676 m)   Wt 148 lb (67.1 kg)   SpO2 97%   BMI 23.89 kg/m   Physical Exam Constitutional:      General: He is not in acute distress.    Appearance: Normal appearance. He is well-developed and normal weight. He is not  ill-appearing, toxic-appearing or diaphoretic.  HENT:     Head: Normocephalic and atraumatic.     Right Ear: Tympanic membrane, ear canal and external ear normal. No drainage, swelling or tenderness. No middle ear effusion. There is no impacted cerumen. Tympanic membrane is not erythematous or bulging.     Left Ear: Tympanic membrane, ear canal and external ear normal. No drainage, swelling or tenderness.  No middle ear effusion. There is no impacted cerumen. Tympanic membrane is not erythematous or bulging.     Nose: Nose normal. No congestion or rhinorrhea.     Mouth/Throat:     Mouth: Mucous membranes are moist.     Pharynx: No oropharyngeal exudate or posterior oropharyngeal erythema.  Eyes:     General: No scleral icterus.       Right eye: No discharge.        Left eye: No discharge.     Extraocular Movements: Extraocular movements intact.     Conjunctiva/sclera: Conjunctivae normal.  Cardiovascular:     Rate and Rhythm: Normal rate and regular rhythm.     Heart sounds: Normal heart sounds. No murmur heard.    No friction rub. No gallop.  Pulmonary:     Effort: Pulmonary effort is normal. No respiratory distress.     Breath sounds: Normal breath sounds. No stridor. No wheezing, rhonchi or rales.  Musculoskeletal:     Cervical back: Normal range of motion and neck supple. No rigidity. No muscular tenderness.  Neurological:     General: No focal deficit present.     Mental Status: He is alert and oriented to person, place, and time.  Psychiatric:        Mood and Affect: Mood normal.        Behavior: Behavior normal.        Thought Content: Thought content normal.     Results for orders placed or performed during the hospital encounter of 12/17/23 (from the past 24 hours)  POC Covid19/Flu A&B Antigen     Status: None   Collection Time: 12/17/23  2:38 PM  Result Value Ref Range   Influenza A Antigen, POC Negative Negative   Influenza B Antigen, POC Negative Negative   Covid  Antigen, POC Negative Negative    Assessment and Plan :   PDMP not reviewed this encounter.  1. Acute viral syndrome    Deferred imaging given clear cardiopulmonary exam, hemodynamically stable vital signs. Suspect viral URI, viral syndrome. Physical exam findings reassuring and vital signs stable for discharge. Advised supportive care, offered symptomatic relief. Counseled patient on potential for adverse effects with medications prescribed/recommended today, ER and return-to-clinic precautions discussed, patient verbalized understanding.  Wallis Bamberg, [place] 12/18/23 [phone]

## 2023-12-20 ENCOUNTER — Other Ambulatory Visit: Payer: Self-pay | Admitting: Emergency Medicine

## 2023-12-20 DIAGNOSIS — E785 Hyperlipidemia, unspecified: Secondary | ICD-10-CM

## 2023-12-21 ENCOUNTER — Other Ambulatory Visit: Payer: Self-pay | Admitting: Emergency Medicine

## 2023-12-21 DIAGNOSIS — I152 Hypertension secondary to endocrine disorders: Secondary | ICD-10-CM

## 2023-12-30 ENCOUNTER — Other Ambulatory Visit: Payer: Self-pay | Admitting: Emergency Medicine

## 2023-12-30 DIAGNOSIS — F02A0 Dementia in other diseases classified elsewhere, mild, without behavioral disturbance, psychotic disturbance, mood disturbance, and anxiety: Secondary | ICD-10-CM | POA: Diagnosis not present

## 2023-12-30 DIAGNOSIS — N401 Enlarged prostate with lower urinary tract symptoms: Secondary | ICD-10-CM

## 2024-01-17 DIAGNOSIS — F02A0 Dementia in other diseases classified elsewhere, mild, without behavioral disturbance, psychotic disturbance, mood disturbance, and anxiety: Secondary | ICD-10-CM | POA: Diagnosis not present

## 2024-01-31 ENCOUNTER — Ambulatory Visit (INDEPENDENT_AMBULATORY_CARE_PROVIDER_SITE_OTHER): Admitting: Podiatry

## 2024-01-31 ENCOUNTER — Encounter: Payer: Self-pay | Admitting: Podiatry

## 2024-01-31 VITALS — Ht 66.0 in | Wt 148.0 lb

## 2024-01-31 DIAGNOSIS — E1142 Type 2 diabetes mellitus with diabetic polyneuropathy: Secondary | ICD-10-CM | POA: Diagnosis not present

## 2024-01-31 DIAGNOSIS — M79675 Pain in left toe(s): Secondary | ICD-10-CM

## 2024-01-31 DIAGNOSIS — E119 Type 2 diabetes mellitus without complications: Secondary | ICD-10-CM | POA: Diagnosis not present

## 2024-01-31 DIAGNOSIS — M2012 Hallux valgus (acquired), left foot: Secondary | ICD-10-CM | POA: Diagnosis not present

## 2024-01-31 DIAGNOSIS — M79674 Pain in right toe(s): Secondary | ICD-10-CM

## 2024-01-31 DIAGNOSIS — B351 Tinea unguium: Secondary | ICD-10-CM

## 2024-01-31 DIAGNOSIS — M2011 Hallux valgus (acquired), right foot: Secondary | ICD-10-CM

## 2024-02-07 NOTE — Progress Notes (Signed)
 ANNUAL DIABETIC FOOT EXAM  Subjective: Tony Price presents today for annual diabetic foot exam.  Chief Complaint  Patient presents with   Nail Problem    Pt is here for [facility] PCP is Dr Vedia Geralds and LOV was in October.   Patient confirms h/o diabetes.  Patient denies any h/o foot wounds.  Patient has been diagnosed with neuropathy.  Sagardia, Miguel Jose, MD is patient's PCP.  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   Type II diabetes mellitus (HCC) 02/03/2023   Patient Active Problem List   Diagnosis Date Noted   Hypercholesterolemia 02/03/2023   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   Type II diabetes mellitus (HCC)    Weight loss 04/28/2022   Erectile dysfunction due to arterial insufficiency 09/14/2021   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 30 tablet 0   amLODipine  (NORVASC ) 10 MG tablet TAKE 1 TABLET BY MOUTH EVERY DAY 90 tablet 1   Ascorbic Acid (VITAMIN C) 500 MG CAPS See admin instructions.     aspirin  81 MG EC tablet 1 tablet     atorvastatin  (LIPITOR) 20 MG tablet TAKE 1 TABLET BY MOUTH EVERY DAY 90 tablet 1   blood glucose meter kit and supplies KIT 1 touch ultra meter.   Check blood sugar once daily E11.9 1 each 0   Blood Glucose Monitoring Suppl (ONE TOUCH ULTRA SYSTEM KIT) w/Device KIT Test blood sugar once daily. Dx: E11.9 1 each 0   carboxymethylcellulose (REFRESH PLUS) 0.5 % SOLN 1 drop 2 (two) times daily as needed. Into both eyes     cetirizine  HCl (ZYRTEC ) 1 MG/ML solution Take 5 mLs (5 mg total) by mouth daily. 300 mL 0   donepezil  (ARICEPT ) 10 MG tablet Take 1 tablet (10 mg total) by mouth daily. 30 tablet 11   escitalopram  (LEXAPRO ) 5 MG tablet TAKE 1 TABLET (5 MG TOTAL) BY MOUTH DAILY. 90 tablet 2   glimepiride  (AMARYL ) 2 MG tablet TAKE 1 TABLET BY MOUTH TWICE A DAY 180 tablet 1   glucose blood test strip Test blood sugar once daily. Dx: E11.9 100 each 3   Lancets (ONETOUCH ULTRASOFT) lancets Test blood sugar once daily. Dx: E11.9 100 each 3   latanoprost  (XALATAN ) 0.005 % ophthalmic solution 1 drop at bedtime.  memantine  (NAMENDA ) 10 MG tablet Take 1 tablet (10 mg total) by mouth 2 (two) times daily. 180 tablet 3   promethazine -dextromethorphan (PROMETHAZINE -DM) 6.25-15 MG/5ML syrup Take 2.5 mLs by mouth 3 (three) times daily as needed for cough. 100 mL 0   sildenafil  (VIAGRA ) 100 MG tablet Take 0.5-1 tablets (50-100 mg total) by mouth daily as needed for erectile dysfunction. 5 tablet 11   tamsulosin  (FLOMAX ) 0.4 MG CAPS capsule TAKE 1 CAPSULE BY MOUTH EVERY DAY 90 capsule 3   valsartan  (DIOVAN ) 160 MG tablet TAKE 1 TABLET BY MOUTH EVERY DAY 90 tablet 3   No current facility-administered medications on file prior to visit.    No  Known Allergies Social History   Occupational History   Occupation: Retired  Tobacco Use   Smoking status: Former    Types: Cigarettes   Smokeless tobacco: Never  Substance and Sexual Activity   Alcohol use: No    Alcohol/week: 0.0 standard drinks of alcohol   Drug use: No   Sexual activity: Not Currently   Family History  Problem Relation Age of Onset   Dementia Cousin        maternal; x2   Immunization History  Administered Date(s) Administered   Fluad Quad(high Dose 65+) 07/06/2021, 07/29/2022   PFIZER(Purple Top)SARS-COV-2 Vaccination 12/03/2019, 12/29/2019, 08/02/2020   Pneumococcal Polysaccharide-23 02/21/2020   Tdap 10/18/2010     Review of Systems: Negative except as noted in the HPI.   Objective: There were no vitals filed for this visit.  Tony Price is a pleasant 83 y.o. male in NAD. AAO X 3.  Diabetic foot exam was performed with the following findings:   Vascular Examination: Capillary refill time immediate b/l. Vascular status intact b/l with palpable pedal pulses. Pedal hair present b/l. No pain with calf compression b/l. Skin temperature gradient WNL b/l. No cyanosis or clubbing b/l. No ischemia or gangrene noted b/l. No edema noted b/l LE.  Neurological Examination: Sensation grossly intact b/l with 10 gram monofilament. Vibratory sensation intact b/l. Pt has subjective symptoms of neuropathy.  Dermatological Examination: Pedal skin with normal turgor, texture and tone b/l.  No open wounds. No interdigital macerations.   Toenails 1-5 b/l thick, discolored, elongated with subungual debris and pain on dorsal palpation.   No corns, calluses nor porokeratotic lesions noted.  Musculoskeletal Examination: Muscle strength 5/5 to all lower extremity muscle groups bilaterally. No pain, crepitus or joint limitation noted with ROM bilateral LE. HAV with bunion deformity noted b/l LE.  Radiographs: None     Lab Results  Component Value Date   HGBA1C  6.2 (A) 08/17/2023   ADA Risk Categorization: Low Risk :  Patient has all of the following: Intact protective sensation No prior foot ulcer  No severe deformity Pedal pulses present  Assessment: 1. Pain due to onychomycosis of toenails of both feet   2. Hallux valgus, acquired, bilateral   3. Diabetic peripheral neuropathy associated with type 2 diabetes mellitus (HCC)   4. Encounter for diabetic foot exam (HCC)     Plan: Diabetic foot examination performed today.  All patient's and/or POA's questions/concerns addressed on today's visit. Mycotic toenails 1-5 debrided in length and girth without incident. Continue daily foot inspections and monitor blood glucose per PCP/Endocrinologist's recommendations. Continue soft, supportive shoe gear daily. Report any pedal injuries to medical professional. Call office if there are any questions/concerns. -Patient/POA to call should there be question/concern in the interim. Return in about 3 months (around 05/01/2024).  Luella Sager,  DPM      [place] LOCATION: [address] [place] [address]                   Office [phone]   [facility] LOCATION: [address] [place], [place] [address] Office [phone]

## 2024-02-10 ENCOUNTER — Encounter: Payer: Self-pay | Admitting: Physician Assistant

## 2024-02-15 ENCOUNTER — Ambulatory Visit (INDEPENDENT_AMBULATORY_CARE_PROVIDER_SITE_OTHER): Payer: Medicare Other | Admitting: Emergency Medicine

## 2024-02-15 ENCOUNTER — Encounter: Payer: Self-pay | Admitting: Emergency Medicine

## 2024-02-15 VITALS — BP 120/62 | HR 72 | Temp 99.1°F | Ht 66.0 in | Wt 142.0 lb

## 2024-02-15 DIAGNOSIS — Z7984 Long term (current) use of oral hypoglycemic drugs: Secondary | ICD-10-CM

## 2024-02-15 DIAGNOSIS — E1159 Type 2 diabetes mellitus with other circulatory complications: Secondary | ICD-10-CM

## 2024-02-15 DIAGNOSIS — E785 Hyperlipidemia, unspecified: Secondary | ICD-10-CM

## 2024-02-15 DIAGNOSIS — R634 Abnormal weight loss: Secondary | ICD-10-CM

## 2024-02-15 DIAGNOSIS — I152 Hypertension secondary to endocrine disorders: Secondary | ICD-10-CM

## 2024-02-15 DIAGNOSIS — N138 Other obstructive and reflux uropathy: Secondary | ICD-10-CM | POA: Diagnosis not present

## 2024-02-15 DIAGNOSIS — N401 Enlarged prostate with lower urinary tract symptoms: Secondary | ICD-10-CM

## 2024-02-15 DIAGNOSIS — E1169 Type 2 diabetes mellitus with other specified complication: Secondary | ICD-10-CM | POA: Diagnosis not present

## 2024-02-15 DIAGNOSIS — Z23 Encounter for immunization: Secondary | ICD-10-CM | POA: Diagnosis not present

## 2024-02-15 LAB — POCT GLYCOSYLATED HEMOGLOBIN (HGB A1C): Hemoglobin A1C: 6.2 % — AB (ref 4.0–5.6)

## 2024-02-15 NOTE — Assessment & Plan Note (Signed)
 Well-controlled diabetes and hypertension Hemoglobin A1c 6.2 Cardiovascular risks associated with both conditions discussed Diet and nutrition discussed.  Encouraged to increase daily caloric intake Continue glimepiride  2 mg twice a day, amlodipine  10 mg daily and valsartan  160 mg daily Recommend follow-up in 6 months

## 2024-02-15 NOTE — Assessment & Plan Note (Addendum)
 Wt Readings from Last 3 Encounters:  02/15/24 142 lb (64.4 kg)  01/31/24 148 lb (67.1 kg)  12/17/23 148 lb (67.1 kg)  Stable.  Good appetite.  No concerns. Negative for malignancy workup

## 2024-02-15 NOTE — Assessment & Plan Note (Signed)
Chronic stable conditions Continue daily glimepiride 2 mg twice a day and atorvastatin 20 mg daily Diet and nutrition discussed

## 2024-02-15 NOTE — Progress Notes (Signed)
 Tony Price 83 y.o.   Chief Complaint  Patient presents with   Follow-up    Patient here for 6 month f/u. No other concerns     HISTORY OF PRESENT ILLNESS: This is a 83 y.o. male A1A here for 1-month follow-up of multiple chronic medical conditions including diabetes and hypertension. Overall doing well.  Has no complaints or medical concerns today.  HPI   Prior to Admission medications   Medication Sig Start Date End Date Taking? Authorizing Provider  acetaminophen  (TYLENOL ) 325 MG tablet Take 2 tablets (650 mg total) by mouth every 6 (six) hours as needed for moderate pain (pain score 4-6). 12/17/23  Yes Adolph Hoop, PA-C  amLODipine  (NORVASC ) 10 MG tablet TAKE 1 TABLET BY MOUTH EVERY DAY 06/15/23  Yes Ilze Roselli, Isidro Margo, MD  Ascorbic Acid (VITAMIN C) 500 MG CAPS See admin instructions.   Yes [provider]  aspirin  81 MG EC tablet 1 tablet   Yes [provider]  atorvastatin  (LIPITOR) 20 MG tablet TAKE 1 TABLET BY MOUTH EVERY DAY 12/20/23  Yes Bunnie Lederman, Isidro Margo, MD  blood glucose meter kit and supplies KIT 1 touch ultra meter.   Check blood sugar once daily E11.9 07/14/17  Yes Benjiman Bras, MD  Blood Glucose Monitoring Suppl (ONE TOUCH ULTRA SYSTEM KIT) w/Device KIT Test blood sugar once daily. Dx: E11.9 07/01/16  Yes Benjiman Bras, MD  carboxymethylcellulose (REFRESH PLUS) 0.5 % SOLN 1 drop 2 (two) times daily as needed. Into both eyes   Yes [provider]  cetirizine  HCl (ZYRTEC ) 1 MG/ML solution Take 5 mLs (5 mg total) by mouth daily. 12/17/23  Yes Adolph Hoop, PA-C  donepezil  (ARICEPT ) 10 MG tablet Take 1 tablet (10 mg total) by mouth daily. 09/28/23  Yes Wertman, Sara E, PA-C  escitalopram  (LEXAPRO ) 5 MG tablet TAKE 1 TABLET (5 MG TOTAL) BY MOUTH DAILY. 10/13/23  Yes Wertman, Sara E, PA-C  glimepiride  (AMARYL ) 2 MG tablet TAKE 1 TABLET BY MOUTH TWICE A DAY 03/10/23  Yes Shawne Eskelson, Isidro Margo, MD  glucose blood test strip Test blood  sugar once daily. Dx: E11.9 08/01/23  Yes Elvira Hammersmith, MD  Lancets [facility] ULTRASOFT) lancets Test blood sugar once daily. Dx: E11.9 07/04/17  Yes Benjiman Bras, MD  latanoprost  (XALATAN ) 0.005 % ophthalmic solution 1 drop at bedtime. 08/31/19  Yes [provider]  memantine  (NAMENDA ) 10 MG tablet Take 1 tablet (10 mg total) by mouth 2 (two) times daily. 09/28/23  Yes Wertman, Sara E, PA-C  sildenafil  (VIAGRA ) 100 MG tablet Take 0.5-1 tablets (50-100 mg total) by mouth daily as needed for erectile dysfunction. 10/20/22  Yes Elvira Hammersmith, MD  tamsulosin  (FLOMAX ) 0.4 MG CAPS capsule TAKE 1 CAPSULE BY MOUTH EVERY DAY 12/30/23  Yes Mivaan Corbitt, Isidro Margo, MD  valsartan  (DIOVAN ) 160 MG tablet TAKE 1 TABLET BY MOUTH EVERY DAY 12/21/23  Yes Dominik Yordy, Isidro Margo, MD  promethazine -dextromethorphan (PROMETHAZINE -DM) 6.25-15 MG/5ML syrup Take 2.5 mLs by mouth 3 (three) times daily as needed for cough. 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   History of colonic  polyps 11/04/2020   Diabetic retinopathy (HCC) 03/31/2016   Dyslipidemia associated with type 2 diabetes mellitus (HCC) 11/17/2014   Hypertension associated with diabetes (HCC) 11/17/2014    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   Chest pain    Diabetic retinopathy (HCC) 03/31/2016   Both eyes mild background diabetic retinopathy   Dyslipidemia associated with type 2 diabetes mellitus (HCC) 11/17/2014    Erectile dysfunction due to arterial insufficiency 09/14/2021   History of colonic polyps 11/04/2020   Hypercholesterolemia    Hypertension associated with diabetes (HCC) 11/17/2014   Kidney stones    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   Type II diabetes mellitus (HCC) 02/03/2023    Past Surgical History:  Procedure Laterality Date   CATARACT EXTRACTION     EYE SURGERY     PROSTATE BIOPSY     ROTATOR CUFF REPAIR     Right    Social History   Socioeconomic History   Marital status: Divorced    Spouse name: Not on file   Number of children: 3   Years of education: 12   Highest education level: High school graduate  Occupational History   Occupation: Retired  Tobacco Use   Smoking status: Former    Types: Cigarettes   Smokeless tobacco: Never  Substance and Sexual Activity   Alcohol use: No    Alcohol/week: 0.0 standard drinks of alcohol   Drug use: No   Sexual activity: Not Currently  Other Topics Concern   Not on file  Social History Narrative   Right handed   No caffeine   No falls   One story home   Social Drivers of Health   Financial Resource Strain: Low Risk  (07/13/2023)   Overall Financial Resource Strain (CARDIA)    Difficulty of Paying Living Expenses: Not hard at all  Food Insecurity: No Food Insecurity (07/13/2023)   Hunger Vital Sign    Worried About Running Out of Food in the Last Year: Never true    Ran Out of Food in the Last Year: Never true  Transportation Needs: No Transportation Needs (07/13/2023)   PRAPARE - Administrator, Civil Service (Medical): No    Lack of Transportation (Non-Medical): No  Physical Activity: Sufficiently Active (07/13/2023)   Exercise Vital Sign    Days of Exercise per Week: 3 days    Minutes of Exercise per Session: 50 min  Stress: No Stress Concern Present (07/13/2023)   [employer] of Occupational Health - Occupational Stress Questionnaire    Feeling of Stress  : Not at all  Social Connections: Unknown (07/13/2023)   Social Connection and Isolation Panel [NHANES]    Frequency of Communication with Friends and Family: More than three times a week    Frequency of Social Gatherings with Friends and Family: More than three times a week    Attends Religious Services: Patient unable to answer    Active Member of Clubs or Organizations: No    Attends Banker Meetings: Not on file    Marital Status: Divorced  Intimate Partner Violence: Not At Risk (07/13/2023)   Humiliation, Afraid, Rape, and Kick questionnaire    Fear of Current or Ex-Partner: No    Emotionally Abused: No    Physically Abused: No    Sexually Abused: No    Family History  Problem Relation Age of Onset  Dementia Cousin        maternal; x2     Review of Systems  Constitutional: Negative.  Negative for chills and fever.  HENT: Negative.  Negative for congestion and sore throat.   Respiratory: Negative.  Negative for cough and shortness of breath.   Cardiovascular: Negative.  Negative for chest pain and palpitations.  Gastrointestinal:  Negative for abdominal pain, diarrhea, nausea and vomiting.  Genitourinary: Negative.  Negative for dysuria and hematuria.  Skin: Negative.  Negative for rash.  Neurological: Negative.  Negative for dizziness and headaches.  All other systems reviewed and are negative.   Vitals:   02/15/24 1338  BP: 120/62  Pulse: 72  Temp: 99.1 F (37.3 C)  SpO2: 98%    Physical Exam Vitals reviewed.  Constitutional:      Appearance: Normal appearance.  HENT:     Head: Normocephalic.     Mouth/Throat:     Mouth: Mucous membranes are moist.     Pharynx: Oropharynx is clear.  Eyes:     Extraocular Movements: Extraocular movements intact.     Conjunctiva/sclera: Conjunctivae normal.     Pupils: Pupils are equal, round, and reactive to light.  Cardiovascular:     Rate and Rhythm: Normal rate and regular rhythm.     Pulses: Normal  pulses.     Heart sounds: Normal heart sounds.  Pulmonary:     Effort: Pulmonary effort is normal.     Breath sounds: Normal breath sounds.  Musculoskeletal:     Cervical back: No tenderness.  Lymphadenopathy:     Cervical: No cervical adenopathy.  Skin:    General: Skin is warm and dry.  Neurological:     Mental Status: He is alert and oriented to person, place, and time.  Psychiatric:        Mood and Affect: Mood normal.        Behavior: Behavior normal.    Results for orders placed or performed in visit on 02/15/24 (from the past 24 hours)  POCT HgB A1C     Status: Abnormal   Collection Time: 02/15/24  1:50 PM  Result Value Ref Range   Hemoglobin A1C 6.2 (A) 4.0 - 5.6 %   HbA1c POC (<> result, manual entry)     HbA1c, POC (prediabetic range)     HbA1c, POC (controlled diabetic range)       ASSESSMENT & PLAN: A total of 44 minutes was spent with the patient and counseling/coordination of care regarding preparing for this visit, review of most recent office visit notes, review of multiple chronic medical conditions and their management, cardiovascular risks associated with hypertension and diabetes, review of all medications, review of most recent bloodwork results including interpretation of today's hemoglobin A1c, review of health maintenance items, education on nutrition, prognosis, documentation, and need for follow up.   Problem List Items Addressed This Visit       Cardiovascular and Mediastinum   Hypertension associated with diabetes (HCC) - Primary   Well-controlled diabetes and hypertension Hemoglobin A1c 6.2 Cardiovascular risks associated with both conditions discussed Diet and nutrition discussed.  Encouraged to increase daily caloric intake Continue glimepiride  2 mg twice a day, amlodipine  10 mg daily and valsartan  160 mg daily Recommend follow-up in 6 months        Endocrine   Dyslipidemia associated with type 2 diabetes mellitus (HCC)   Chronic stable  conditions Continue daily glimepiride  2 mg twice a day and atorvastatin  20 mg daily Diet and nutrition  discussed      Relevant Orders   POCT HgB A1C (Completed)     Genitourinary   BPH with obstruction/lower urinary tract symptoms   Asymptomatic.  Continues Flomax  0.4 mg daily        Other   Weight loss   Wt Readings from Last 3 Encounters:  02/15/24 142 lb (64.4 kg)  01/31/24 148 lb (67.1 kg)  12/17/23 148 lb (67.1 kg)  Stable.  Good appetite.  No concerns. Negative for malignancy workup       Other Visit Diagnoses       Need for vaccination       Relevant Orders   Pneumococcal conjugate vaccine 20-valent (Prevnar 20)      Patient Instructions  Health Maintenance After Age 75 After age 76, you are at a higher risk for certain long-term diseases and infections as well as injuries from falls. Falls are a major cause of broken bones and head injuries in people who are older than age 40. Getting regular preventive care can help to keep you healthy and well. Preventive care includes getting regular testing and making lifestyle changes as recommended by your health care provider. Talk with your health care provider about: Which screenings and tests you should have. A screening is a test that checks for a disease when you have no symptoms. A diet and exercise plan that is right for you. What should I know about screenings and tests to prevent falls? Screening and testing are the best ways to find a health problem early. Early diagnosis and treatment give you the best chance of managing medical conditions that are common after age 24. Certain conditions and lifestyle choices may make you more likely to have a fall. Your health care provider may recommend: Regular vision checks. Poor vision and conditions such as cataracts can make you more likely to have a fall. If you wear glasses, make sure to get your prescription updated if your vision changes. Medicine review. Work with your  health care provider to regularly review all of the medicines you are taking, including over-the-counter medicines. Ask your health care provider about any side effects that may make you more likely to have a fall. Tell your health care provider if any medicines that you take make you feel dizzy or sleepy. Strength and balance checks. Your health care provider may recommend certain tests to check your strength and balance while standing, walking, or changing positions. Foot health exam. Foot pain and numbness, as well as not wearing proper footwear, can make you more likely to have a fall. Screenings, including: Osteoporosis screening. Osteoporosis is a condition that causes the bones to get weaker and break more easily. Blood pressure screening. Blood pressure changes and medicines to control blood pressure can make you feel dizzy. Depression screening. You may be more likely to have a fall if you have a fear of falling, feel depressed, or feel unable to do activities that you used to do. Alcohol use screening. Using too much alcohol can affect your balance and may make you more likely to have a fall. Follow these instructions at home: Lifestyle Do not drink alcohol if: Your health care provider tells you not to drink. If you drink alcohol: Limit how much you have to: 0-1 drink a day for women. 0-2 drinks a day for men. Know how much alcohol is in your drink. In the [place] one drink equals one 12 oz bottle of beer (355 mL), one 5 oz glass of  wine (148 mL), or one 1 oz glass of hard liquor (44 mL). Do not use any products that contain nicotine or tobacco. These products include cigarettes, chewing tobacco, and vaping devices, such as e-cigarettes. If you need help quitting, ask your health care provider. Activity  Follow a regular exercise program to stay fit. This will help you maintain your balance. Ask your health care provider what types of exercise are appropriate for you. If you need a  cane or walker, use it as recommended by your health care provider. Wear supportive shoes that have nonskid soles. Safety  Remove any tripping hazards, such as rugs, cords, and clutter. Install safety equipment such as grab bars in bathrooms and safety rails on stairs. Keep rooms and walkways well-lit. General instructions Talk with your health care provider about your risks for falling. Tell your health care provider if: You fall. Be sure to tell your health care provider about all falls, even ones that seem minor. You feel dizzy, tiredness (fatigue), or off-balance. Take over-the-counter and prescription medicines only as told by your health care provider. These include supplements. Eat a healthy diet and maintain a healthy weight. A healthy diet includes low-fat dairy products, low-fat (lean) meats, and fiber from whole grains, beans, and lots of fruits and vegetables. Stay current with your vaccines. Schedule regular health, dental, and eye exams. Summary Having a healthy lifestyle and getting preventive care can help to protect your health and wellness after age 19. Screening and testing are the best way to find a health problem early and help you avoid having a fall. Early diagnosis and treatment give you the best chance for managing medical conditions that are more common for people who are older than age 36. Falls are a major cause of broken bones and head injuries in people who are older than age 16. Take precautions to prevent a fall at home. Work with your health care provider to learn what changes you can make to improve your health and wellness and to prevent falls. This information is not intended to replace advice given to you by your health care provider. Make sure you discuss any questions you have with your health care provider. Document Revised: 02/23/2021 Document Reviewed: 02/23/2021 Elsevier Patient Education  2024 Elsevier Inc.     Maryagnes Small, MD [place]  [facility]

## 2024-02-15 NOTE — Patient Instructions (Signed)
 Health Maintenance After Age 83 After age 4, you are at a higher risk for certain long-term diseases and infections as well as injuries from falls. Falls are a major cause of broken bones and head injuries in people who are older than age 47. Getting regular preventive care can help to keep you healthy and well. Preventive care includes getting regular testing and making lifestyle changes as recommended by your health care provider. Talk with your health care provider about: Which screenings and tests you should have. A screening is a test that checks for a disease when you have no symptoms. A diet and exercise plan that is right for you. What should I know about screenings and tests to prevent falls? Screening and testing are the best ways to find a health problem early. Early diagnosis and treatment give you the best chance of managing medical conditions that are common after age 37. Certain conditions and lifestyle choices may make you more likely to have a fall. Your health care provider may recommend: Regular vision checks. Poor vision and conditions such as cataracts can make you more likely to have a fall. If you wear glasses, make sure to get your prescription updated if your vision changes. Medicine review. Work with your health care provider to regularly review all of the medicines you are taking, including over-the-counter medicines. Ask your health care provider about any side effects that may make you more likely to have a fall. Tell your health care provider if any medicines that you take make you feel dizzy or sleepy. Strength and balance checks. Your health care provider may recommend certain tests to check your strength and balance while standing, walking, or changing positions. Foot health exam. Foot pain and numbness, as well as not wearing proper footwear, can make you more likely to have a fall. Screenings, including: Osteoporosis screening. Osteoporosis is a condition that causes  the bones to get weaker and break more easily. Blood pressure screening. Blood pressure changes and medicines to control blood pressure can make you feel dizzy. Depression screening. You may be more likely to have a fall if you have a fear of falling, feel depressed, or feel unable to do activities that you used to do. Alcohol use screening. Using too much alcohol can affect your balance and may make you more likely to have a fall. Follow these instructions at home: Lifestyle Do not drink alcohol if: Your health care provider tells you not to drink. If you drink alcohol: Limit how much you have to: 0-1 drink a day for women. 0-2 drinks a day for men. Know how much alcohol is in your drink. In the [place] one drink equals one 12 oz bottle of beer (355 mL), one 5 oz glass of wine (148 mL), or one 1 oz glass of hard liquor (44 mL). Do not use any products that contain nicotine or tobacco. These products include cigarettes, chewing tobacco, and vaping devices, such as e-cigarettes. If you need help quitting, ask your health care provider. Activity  Follow a regular exercise program to stay fit. This will help you maintain your balance. Ask your health care provider what types of exercise are appropriate for you. If you need a cane or walker, use it as recommended by your health care provider. Wear supportive shoes that have nonskid soles. Safety  Remove any tripping hazards, such as rugs, cords, and clutter. Install safety equipment such as grab bars in bathrooms and safety rails on stairs. Keep rooms and walkways  well-lit. General instructions Talk with your health care provider about your risks for falling. Tell your health care provider if: You fall. Be sure to tell your health care provider about all falls, even ones that seem minor. You feel dizzy, tiredness (fatigue), or off-balance. Take over-the-counter and prescription medicines only as told by your health care provider. These include  supplements. Eat a healthy diet and maintain a healthy weight. A healthy diet includes low-fat dairy products, low-fat (lean) meats, and fiber from whole grains, beans, and lots of fruits and vegetables. Stay current with your vaccines. Schedule regular health, dental, and eye exams. Summary Having a healthy lifestyle and getting preventive care can help to protect your health and wellness after age 11. Screening and testing are the best way to find a health problem early and help you avoid having a fall. Early diagnosis and treatment give you the best chance for managing medical conditions that are more common for people who are older than age 28. Falls are a major cause of broken bones and head injuries in people who are older than age 48. Take precautions to prevent a fall at home. Work with your health care provider to learn what changes you can make to improve your health and wellness and to prevent falls. This information is not intended to replace advice given to you by your health care provider. Make sure you discuss any questions you have with your health care provider. Document Revised: 02/23/2021 Document Reviewed: 02/23/2021 Elsevier Patient Education  2024 [employer].

## 2024-02-15 NOTE — Assessment & Plan Note (Signed)
Asymptomatic.  Continues Flomax 0.4 mg daily

## 2024-02-16 DIAGNOSIS — F02A0 Dementia in other diseases classified elsewhere, mild, without behavioral disturbance, psychotic disturbance, mood disturbance, and anxiety: Secondary | ICD-10-CM | POA: Diagnosis not present

## 2024-03-12 ENCOUNTER — Other Ambulatory Visit: Payer: Self-pay | Admitting: Physician Assistant

## 2024-03-13 ENCOUNTER — Other Ambulatory Visit: Payer: Self-pay | Admitting: Radiology

## 2024-03-13 ENCOUNTER — Telehealth: Payer: Self-pay | Admitting: Emergency Medicine

## 2024-03-13 DIAGNOSIS — E11319 Type 2 diabetes mellitus with unspecified diabetic retinopathy without macular edema: Secondary | ICD-10-CM

## 2024-03-13 DIAGNOSIS — N401 Enlarged prostate with lower urinary tract symptoms: Secondary | ICD-10-CM

## 2024-03-13 MED ORDER — GLIMEPIRIDE 2 MG PO TABS: 2.0000 mg | ORAL_TABLET | Freq: Two times a day (BID) | ORAL | 1 refills | Status: DC

## 2024-03-13 NOTE — Telephone Encounter (Signed)
 LVM for Tony Price he is on DPR in media tab. Returning call regarding his questions

## 2024-03-13 NOTE — Telephone Encounter (Signed)
 Copied from CRM [id number]. Topic: Clinical - Medication Question >> Mar 13, 2024  2:31 PM Fonda T wrote: Reason for CRM: Patient son, POA, Jari Dipasquale calling, requesting to speak to a nurse in regards to medication patient is taking.  Requesting a return call as soon as possible to review entire medication list for clarity and to ensure patient is taking all medications as prescribed.  Call back number for son, Jyden Kromer, [place] is [phone]

## 2024-03-13 NOTE — Telephone Encounter (Signed)
 Spoke with Son and Clarified medications pt should be taking

## 2024-03-14 ENCOUNTER — Other Ambulatory Visit: Payer: Self-pay | Admitting: Emergency Medicine

## 2024-03-14 DIAGNOSIS — I10 Essential (primary) hypertension: Secondary | ICD-10-CM

## 2024-03-14 DIAGNOSIS — E11319 Type 2 diabetes mellitus with unspecified diabetic retinopathy without macular edema: Secondary | ICD-10-CM

## 2024-03-14 NOTE — Telephone Encounter (Signed)
 Pt has requested amlodipine  and glimepiride . Per chart review, glimepiride  was refilled yesterday 5/27.

## 2024-03-14 NOTE — Telephone Encounter (Signed)
 Copied from CRM [id number]. Topic: Clinical - Medication Refill >> Mar 14, 2024 12:32 PM Earnestine Goes B wrote: Medication: amLODipine  (NORVASC ) 10 MG tablet, glimepiride  (AMARYL ) 2 MG tablet  Has the patient contacted their pharmacy? Yes (Agent: If no, request that the patient contact the pharmacy for the refill. If patient does not wish to contact the pharmacy document the reason why and proceed with request.) (Agent: If yes, when and what did the pharmacy advise?)  This is the patient's preferred pharmacy:  [facility] - [place], [place] - [address] [place] [address] Phone: [phone] Fax: [phone]  Is this the correct pharmacy for this prescription? Yes If no, delete pharmacy and type the correct one.   Has the prescription been filled recently? Yes  Is the patient out of the medication? Yes  Has the patient been seen for an appointment in the last year OR does the patient have an upcoming appointment? Yes  Can we respond through MyChart? Yes  Agent: Please be advised that Rx refills may take up to 3 business days. We ask that you follow-up with your pharmacy.

## 2024-03-14 NOTE — Telephone Encounter (Unsigned)
 Copied from CRM [id number]. Topic: Clinical - Medication Refill >> Mar 14, 2024  8:54 AM Juleen Oakland F wrote: Medication: amLODipine  (NORVASC ) 10 MG tablet  Has the patient contacted their pharmacy? Yes (Agent: If no, request that the patient contact the pharmacy for the refill. If patient does not wish to contact the pharmacy document the reason why and proceed with request.) (Agent: If yes, when and what did the pharmacy advise?)  This is the patient's preferred pharmacy:   [facility] - [place], [place] - [address] [place] [address] Phone: [phone] Fax: [phone]  Is this the correct pharmacy for this prescription? Yes If no, delete pharmacy and type the correct one.   Has the prescription been filled recently? Yes  Is the patient out of the medication? [name]  Has the patient been seen for an appointment in the last year OR does the patient have an upcoming appointment? Yes  Can we respond through MyChart? No  Agent: Please be advised that Rx refills may take up to 3 business days. We ask that you follow-up with your pharmacy.

## 2024-03-15 MED ORDER — AMLODIPINE BESYLATE 10 MG PO TABS: 10.0000 mg | ORAL_TABLET | Freq: Every day | ORAL | 1 refills | Status: DC

## 2024-03-18 DIAGNOSIS — F02A0 Dementia in other diseases classified elsewhere, mild, without behavioral disturbance, psychotic disturbance, mood disturbance, and anxiety: Secondary | ICD-10-CM | POA: Diagnosis not present

## 2024-03-27 ENCOUNTER — Ambulatory Visit
Admission: EM | Admit: 2024-03-27 | Discharge: 2024-03-27 | Disposition: A | Discharge status: Other Acute Inpatient Hospital | Attending: Family Medicine | Admitting: Family Medicine

## 2024-03-27 ENCOUNTER — Encounter (HOSPITAL_BASED_OUTPATIENT_CLINIC_OR_DEPARTMENT_OTHER): Payer: Self-pay | Admitting: Emergency Medicine

## 2024-03-27 ENCOUNTER — Other Ambulatory Visit: Payer: Self-pay

## 2024-03-27 ENCOUNTER — Emergency Department (HOSPITAL_BASED_OUTPATIENT_CLINIC_OR_DEPARTMENT_OTHER)

## 2024-03-27 ENCOUNTER — Ambulatory Visit: Payer: Medicare Other

## 2024-03-27 ENCOUNTER — Observation Stay (HOSPITAL_BASED_OUTPATIENT_CLINIC_OR_DEPARTMENT_OTHER)
Admission: EM | Admit: 2024-03-27 | Discharge: 2024-03-28 | Disposition: A | Discharge status: Home / Self Care | Attending: Internal Medicine | Admitting: Internal Medicine

## 2024-03-27 ENCOUNTER — Institutional Professional Consult (permissible substitution): Payer: Medicare Other | Admitting: Psychology

## 2024-03-27 DIAGNOSIS — R55 Syncope and collapse: Secondary | ICD-10-CM | POA: Diagnosis not present

## 2024-03-27 DIAGNOSIS — I77810 Thoracic aortic ectasia: Secondary | ICD-10-CM | POA: Diagnosis not present

## 2024-03-27 DIAGNOSIS — I350 Nonrheumatic aortic (valve) stenosis: Secondary | ICD-10-CM | POA: Diagnosis not present

## 2024-03-27 DIAGNOSIS — Z7982 Long term (current) use of aspirin: Secondary | ICD-10-CM | POA: Diagnosis not present

## 2024-03-27 DIAGNOSIS — I959 Hypotension, unspecified: Secondary | ICD-10-CM

## 2024-03-27 DIAGNOSIS — Z79899 Other long term (current) drug therapy: Secondary | ICD-10-CM | POA: Diagnosis not present

## 2024-03-27 DIAGNOSIS — I129 Hypertensive chronic kidney disease with stage 1 through stage 4 chronic kidney disease, or unspecified chronic kidney disease: Secondary | ICD-10-CM | POA: Insufficient documentation

## 2024-03-27 DIAGNOSIS — R079 Chest pain, unspecified: Secondary | ICD-10-CM | POA: Insufficient documentation

## 2024-03-27 DIAGNOSIS — G309 Alzheimer's disease, unspecified: Secondary | ICD-10-CM | POA: Diagnosis not present

## 2024-03-27 DIAGNOSIS — N3000 Acute cystitis without hematuria: Principal | ICD-10-CM | POA: Insufficient documentation

## 2024-03-27 DIAGNOSIS — Z87891 Personal history of nicotine dependence: Secondary | ICD-10-CM | POA: Insufficient documentation

## 2024-03-27 DIAGNOSIS — R531 Weakness: Secondary | ICD-10-CM

## 2024-03-27 DIAGNOSIS — N390 Urinary tract infection, site not specified: Secondary | ICD-10-CM | POA: Diagnosis present

## 2024-03-27 DIAGNOSIS — R42 Dizziness and giddiness: Secondary | ICD-10-CM

## 2024-03-27 DIAGNOSIS — E861 Hypovolemia: Secondary | ICD-10-CM | POA: Diagnosis not present

## 2024-03-27 DIAGNOSIS — Z7984 Long term (current) use of oral hypoglycemic drugs: Secondary | ICD-10-CM | POA: Insufficient documentation

## 2024-03-27 DIAGNOSIS — N1831 Chronic kidney disease, stage 3a: Secondary | ICD-10-CM | POA: Insufficient documentation

## 2024-03-27 DIAGNOSIS — E785 Hyperlipidemia, unspecified: Secondary | ICD-10-CM | POA: Insufficient documentation

## 2024-03-27 DIAGNOSIS — E1122 Type 2 diabetes mellitus with diabetic chronic kidney disease: Secondary | ICD-10-CM | POA: Diagnosis not present

## 2024-03-27 LAB — CBC WITH DIFFERENTIAL/PLATELET
Abs Immature Granulocytes: 0.02 10*3/uL (ref 0.00–0.07)
Basophils Absolute: 0 10*3/uL (ref 0.0–0.1)
Basophils Relative: 1 %
Eosinophils Absolute: 0 10*3/uL (ref 0.0–0.5)
Eosinophils Relative: 0 %
HCT: 36.1 % — ABNORMAL LOW (ref 39.0–52.0)
Hemoglobin: 11.4 g/dL — ABNORMAL LOW (ref 13.0–17.0)
Immature Granulocytes: 0 %
Lymphocytes Relative: 18 %
Lymphs Abs: 1.5 10*3/uL (ref 0.7–4.0)
MCH: 26.5 pg (ref 26.0–34.0)
MCHC: 31.6 g/dL (ref 30.0–36.0)
MCV: 83.8 fL (ref 80.0–100.0)
Monocytes Absolute: 0.3 10*3/uL (ref 0.1–1.0)
Monocytes Relative: 4 %
Neutro Abs: 6.2 10*3/uL (ref 1.7–7.7)
Neutrophils Relative %: 77 %
Platelets: 295 10*3/uL (ref 150–400)
RBC: 4.31 MIL/uL (ref 4.22–5.81)
RDW: 14.7 % (ref 11.5–15.5)
WBC: 8.1 10*3/uL (ref 4.0–10.5)
nRBC: 0 % (ref 0.0–0.2)

## 2024-03-27 LAB — COMPREHENSIVE METABOLIC PANEL WITH GFR
ALT: 10 U/L (ref 0–44)
AST: 17 U/L (ref 15–41)
Albumin: 3.7 g/dL (ref 3.5–5.0)
Alkaline Phosphatase: 81 U/L (ref 38–126)
Anion gap: 13 (ref 5–15)
BUN: 24 mg/dL — ABNORMAL HIGH (ref 8–23)
CO2: 21 mmol/L — ABNORMAL LOW (ref 22–32)
Calcium: 8.7 mg/dL — ABNORMAL LOW (ref 8.9–10.3)
Chloride: 108 mmol/L (ref 98–111)
Creatinine, Ser: 1.28 mg/dL — ABNORMAL HIGH (ref 0.61–1.24)
GFR, Estimated: 56 mL/min — ABNORMAL LOW (ref >60)
Glucose, Bld: 136 mg/dL — ABNORMAL HIGH (ref 70–99)
Potassium: 4.2 mmol/L (ref 3.5–5.1)
Sodium: 142 mmol/L (ref 135–145)
Total Bilirubin: 0.3 mg/dL (ref 0.0–1.2)
Total Protein: 6.7 g/dL (ref 6.5–8.1)

## 2024-03-27 LAB — URINALYSIS, ROUTINE W REFLEX MICROSCOPIC
Bilirubin Urine: NEGATIVE
Glucose, UA: NEGATIVE mg/dL
Hgb urine dipstick: NEGATIVE
Ketones, ur: NEGATIVE mg/dL
Nitrite: POSITIVE — AB
Protein, ur: NEGATIVE mg/dL
Specific Gravity, Urine: >=1.03 (ref 1.005–1.030)
pH: 5.5 (ref 5.0–8.0)

## 2024-03-27 LAB — URINALYSIS, MICROSCOPIC (REFLEX)

## 2024-03-27 LAB — TROPONIN T, HIGH SENSITIVITY
Troponin T High Sensitivity: <15 ng/L (ref <19)
Troponin T High Sensitivity: <15 ng/L (ref <19)

## 2024-03-27 LAB — POCT FASTING CBG KUC MANUAL ENTRY: POCT Glucose (KUC): 139 mg/dL — AB (ref 70–99)

## 2024-03-27 MED ORDER — SODIUM CHLORIDE 0.9 % IV SOLN
1.0000 g | Freq: Once | INTRAVENOUS | Status: AC
  Administered 2024-03-27: 1 g via INTRAVENOUS
  Filled 2024-03-27: qty 10

## 2024-03-27 MED ORDER — ACETAMINOPHEN 500 MG PO TABS: 1000.0000 mg | ORAL_TABLET | Freq: Four times a day (QID) | ORAL | Status: DC | PRN

## 2024-03-27 MED ORDER — SODIUM CHLORIDE 0.9% FLUSH
3.0000 mL | Freq: Two times a day (BID) | INTRAVENOUS | Status: DC
  Administered 2024-03-28 (×2): 3 mL via INTRAVENOUS

## 2024-03-27 MED ORDER — POLYETHYLENE GLYCOL 3350 17 G PO PACK: 17.0000 g | PACK | Freq: Every day | ORAL | Status: DC | PRN

## 2024-03-27 MED ORDER — MEMANTINE HCL 10 MG PO TABS
10.0000 mg | ORAL_TABLET | Freq: Two times a day (BID) | ORAL | Status: DC
  Administered 2024-03-28 (×2): 10 mg via ORAL
  Filled 2024-03-27 (×2): qty 1

## 2024-03-27 MED ORDER — LATANOPROST 0.005 % OP SOLN
1.0000 [drp] | Freq: Every day | OPHTHALMIC | Status: DC
  Administered 2024-03-28: 1 [drp] via OPHTHALMIC
  Filled 2024-03-27: qty 2.5

## 2024-03-27 MED ORDER — DONEPEZIL HCL 10 MG PO TABS
10.0000 mg | ORAL_TABLET | Freq: Every day | ORAL | Status: DC
  Administered 2024-03-28: 10 mg via ORAL
  Filled 2024-03-27: qty 1

## 2024-03-27 MED ORDER — TAMSULOSIN HCL 0.4 MG PO CAPS: 0.4000 mg | ORAL_CAPSULE | Freq: Every day | ORAL | Status: DC

## 2024-03-27 MED ORDER — ENOXAPARIN SODIUM 40 MG/0.4ML IJ SOSY
40.0000 mg | PREFILLED_SYRINGE | INTRAMUSCULAR | Status: DC
Start: 2024-03-28 — End: 2024-03-28
  Administered 2024-03-28: 40 mg via SUBCUTANEOUS
  Filled 2024-03-27: qty 0.4

## 2024-03-27 MED ORDER — SODIUM CHLORIDE 0.9 % IV SOLN: 1.0000 g | INTRAVENOUS | Status: DC

## 2024-03-27 MED ORDER — ALBUTEROL SULFATE (2.5 MG/3ML) 0.083% IN NEBU: 2.5000 mg | INHALATION_SOLUTION | RESPIRATORY_TRACT | Status: DC | PRN

## 2024-03-27 MED ORDER — ATORVASTATIN CALCIUM 10 MG PO TABS
20.0000 mg | ORAL_TABLET | Freq: Every day | ORAL | Status: DC
  Administered 2024-03-28: 20 mg via ORAL
  Filled 2024-03-27: qty 2

## 2024-03-27 MED ORDER — IOHEXOL 300 MG/ML  SOLN
100.0000 mL | Freq: Once | INTRAMUSCULAR | Status: AC | PRN
  Administered 2024-03-27: 75 mL via INTRAVENOUS

## 2024-03-27 MED ORDER — MELATONIN 3 MG PO TABS
6.0000 mg | ORAL_TABLET | Freq: Every day | ORAL | Status: DC
  Administered 2024-03-28: 6 mg via ORAL
  Filled 2024-03-27: qty 2

## 2024-03-27 MED ORDER — SODIUM CHLORIDE 0.9 % IV BOLUS
1000.0000 mL | Freq: Once | INTRAVENOUS | Status: AC
  Administered 2024-03-27: 1000 mL via INTRAVENOUS

## 2024-03-27 MED ORDER — ESCITALOPRAM OXALATE 10 MG PO TABS
5.0000 mg | ORAL_TABLET | Freq: Every day | ORAL | Status: DC
  Administered 2024-03-28: 5 mg via ORAL
  Filled 2024-03-27: qty 1

## 2024-03-27 NOTE — ED Notes (Signed)
 Report called to [facility] and given to [facility]

## 2024-03-27 NOTE — ED Provider Notes (Signed)
 [place] [facility] Provider Note   CSN: [id number] Arrival date & time: 03/27/24  1829     History  Chief Complaint  Patient presents with   Hypotension    Tony Price is a 83 y.o. male history of dementia, diabetes, hypertension, hypercholesterol, BPS presents with complaints of hypotension.  Patient reportedly was getting out of the truck when he went down to his knees and was momentarily not responding. His wife brought him to urgent care.  He was referred here for further evaluation as he was found to be hypotensive 90/50.  Patient is now entirely asymptomatic without any complaints including cough, congestion, chest pain, shortness of breath, abdominal pain, vomiting or diarrhea.  He has been compliant with his blood pressure medications which includes valsartan  and amlodipine .  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 Authorizing Provider  acetaminophen  (TYLENOL ) 325 MG tablet Take 2 tablets (650 mg total) by mouth every 6 (six) hours as needed for moderate pain (pain score 4-6).  12/17/23   Adolph Hoop, PA-C  amLODipine  (NORVASC ) 10 MG tablet Take 1 tablet (10 mg total) by mouth daily. 03/15/24   Elvira Hammersmith, MD  Ascorbic Acid (VITAMIN C) 500 MG CAPS See admin instructions.    [provider]  aspirin  81 MG EC tablet 1 tablet    [provider]  atorvastatin  (LIPITOR) 20 MG tablet TAKE 1 TABLET BY MOUTH EVERY DAY 12/20/23   Elvira Hammersmith, MD  blood glucose meter kit and supplies KIT 1 touch ultra meter.   Check blood sugar once daily E11.9 07/14/17   Benjiman Bras, MD  Blood Glucose Monitoring Suppl (ONE TOUCH ULTRA SYSTEM KIT) w/Device KIT Test blood sugar once daily. Dx: E11.9 07/01/16   Benjiman Bras, MD  carboxymethylcellulose (REFRESH PLUS) 0.5 % SOLN 1 drop 2 (two) times daily as needed. Into both eyes    [provider]  cetirizine  HCl (ZYRTEC ) 1 MG/ML solution Take 5 mLs (5 mg total) by mouth daily. 12/17/23   Adolph Hoop, PA-C  donepezil  (ARICEPT ) 10 MG tablet Take 1 tablet (10 mg total) by mouth daily. 09/28/23   Wertman, Sara E, PA-C  escitalopram  (LEXAPRO ) 5 MG tablet TAKE 1 TABLET (5 MG TOTAL) BY MOUTH DAILY. 10/13/23   Wertman, Sara E, PA-C  glimepiride  (AMARYL ) 2 MG tablet Take 1 tablet (2 mg total) by mouth 2 (two) times daily. 03/13/24   Elvira Hammersmith, MD  glucose blood test strip Test blood sugar once daily. Dx: E11.9 08/01/23   Elvira Hammersmith, MD  Lancets (ONETOUCH ULTRASOFT) lancets Test blood sugar once daily. Dx: E11.9 07/04/17   Benjiman Bras, MD  latanoprost  (XALATAN ) 0.005 % ophthalmic solution 1 drop at bedtime. 08/31/19   [provider]  memantine  (NAMENDA ) 10 MG tablet TAKE 1 TABLET BY MOUTH TWICE A DAY 03/13/24   Alane Allen, Sara E, PA-C  promethazine -dextromethorphan (PROMETHAZINE -DM) 6.25-15 MG/5ML syrup Take 2.5 mLs by mouth 3 (three) times daily as needed for cough. Patient not taking: Reported on 02/15/2024 12/17/23   Adolph Hoop, PA-C  sildenafil  (VIAGRA ) 100 MG tablet Take  0.5-1 tablets (50-100 mg total) by mouth daily as needed for erectile dysfunction. 10/20/22   Sagardia, Miguel Jose, MD  tamsulosin  (FLOMAX ) 0.4 MG CAPS capsule TAKE 1 CAPSULE BY MOUTH EVERY DAY 12/30/23   Elvira Hammersmith, MD  valsartan  (DIOVAN ) 160 MG tablet TAKE 1 TABLET BY MOUTH EVERY DAY 12/21/23   Elvira Hammersmith, MD      Allergies    Patient has no known allergies.    Review of Systems   Review of Systems  Neurological:  Positive for weakness.    Physical Exam Updated Vital Signs BP 125/61   Pulse 61   Temp 99.1 F (37.3 C)   Resp 18   Wt 66.7 kg   SpO2 97%   BMI 23.73 kg/m  Physical Exam Vitals and nursing note reviewed.  Constitutional:      General: He is not in acute distress.    Appearance: He is well-developed.  HENT:     Head: Normocephalic and atraumatic.  Eyes:     Conjunctiva/sclera: Conjunctivae normal.  Cardiovascular:     Rate and Rhythm: Normal rate and regular rhythm.     Heart sounds: No murmur heard. Pulmonary:     Effort: Pulmonary effort is normal. No respiratory distress.     Breath sounds: Normal breath sounds.  Abdominal:     Palpations: Abdomen is soft.     Tenderness: There is no abdominal tenderness.  Musculoskeletal:        General: No swelling.     Cervical back: Neck supple.  Skin:    General: Skin is warm and dry.     Capillary Refill: Capillary refill takes less than 2 seconds.  Neurological:     Mental Status: He is alert.     Comments: Patient is alert and oriented. There is no abnormal phonation. Symmetric smile without facial droop. Moves all extremities spontaneously. 5/5 strength in upper and lower extremities. . No sensation deficit. There is no nystagmus. EOMI, PERRL. Coordination intact with finger to nose and normal ambulation.    Psychiatric:        Mood and Affect: Mood normal.     ED Results / Procedures / Treatments   Labs (all labs ordered are listed, but only abnormal results are displayed) Labs  Reviewed  CBC WITH DIFFERENTIAL/PLATELET - Abnormal; Notable for the following components:      Result Value   Hemoglobin 11.4 (*)    HCT 36.1 (*)    All other components within normal limits  COMPREHENSIVE METABOLIC PANEL WITH GFR - Abnormal; Notable for the following components:   CO2 21 (*)    Glucose, Bld 136 (*)    BUN 24 (*)    Creatinine, Ser 1.28 (*)    Calcium  8.7 (*)    GFR, Estimated 56 (*)    All other components within normal limits  URINALYSIS, ROUTINE W REFLEX MICROSCOPIC - Abnormal; Notable for the following components:  APPearance HAZY (*)    Nitrite POSITIVE (*)    Leukocytes,Ua SMALL (*)    All other components within normal limits  URINALYSIS, MICROSCOPIC (REFLEX) - Abnormal; Notable for the following components:   Bacteria, UA MANY (*)    All other components within normal limits  URINE CULTURE  CULTURE, BLOOD (ROUTINE X 2)  CULTURE, BLOOD (ROUTINE X 2)  TROPONIN T, HIGH SENSITIVITY  TROPONIN T, HIGH SENSITIVITY    EKG None  Radiology DG Chest 2 View Result Date: 03/27/2024 CLINICAL DATA:  Hypotension and weakness EXAM: CHEST - 2 VIEW COMPARISON:  Chest radiograph dated 04/28/2022 FINDINGS: Normal lung volumes. Increased density in the right paratracheal region. No pleural effusion or pneumothorax. The heart size and mediastinal contours are within normal limits. Right humeral bone anchors. IMPRESSION: Increased density in the right paratracheal region, which may represent adenopathy. Recommend further evaluation with chest CT. Electronically Signed   By: Limin  Xu M.D.   On: 03/27/2024 20:28    Procedures Procedures    Medications Ordered in ED Medications  cefTRIAXone (ROCEPHIN) 1 g in sodium chloride  0.9 % 100 mL IVPB (has no administration in time range)  sodium chloride  0.9 % bolus 1,000 mL (has no administration in time range)  iohexol (OMNIPAQUE) 300 MG/ML solution 100 mL (has no administration in time range)    ED Course/ Medical Decision  Making/ A&P                                 Medical Decision Making Amount and/or Complexity of Data Reviewed Labs: ordered. Radiology: ordered.   This patient presents to the ED with chief complaint(s) of hypotension.  The complaint involves an extensive differential diagnosis and also carries with it a high risk of complications and morbidity.   Pertinent past medical history as listed in HPI  The differential diagnosis includes  ACS, pneumonia, UTI, orthostatic hypotension, dehydration, CVA/TIA, hypoglycemia Additional history obtained: Records reviewed Care Everywhere/External Records  Assessment and management:   Hemodynamically stable, afebrile, nontoxic-appearing patient presenting with complaints of hypotension and reports of weakness.  Patient is currently asymptomatic without any complaints.  His exam is entirely benign.  He has no neurodeficits on exam.  His lungs are clear and his abdomen is nontender.  He is able to ambulate without difficulty.  Reports that he has been taking his blood pressure medication appropriately.  No signs of dehydration.  He has not been vomiting or have any diarrhea.  His EKG sinus rhythm without any ischemic changes.   Lab work notable for UTI.  Given accompanied hypotension, story consistent with syncope in the setting of infection and comorbidities we will pursue admission.  Chest x-ray with right upper lobe density concerning for adenopathy.  Will obtain chest CT scan as well.   Independent ECG interpretation:  Sinus rhythm, abnormal R wave progression  Independent labs interpretation:  The following labs were independently interpreted:  CMP with mildly elevated creatinine near baseline, UA with positive nitrites, small leukocytes, many bacteria,   Independent visualization and interpretation of imaging: I independently visualized the following imaging with scope of interpretation limited to determining acute life threatening conditions  related to emergency care:  CXR with a right upper lobe density, possible adenopathy, recommended CT Chest CT pending   Consultations obtained:   Hospitalist Dr. [facility] agreed for admission  Disposition:   Patient will be admitted for further workup and management  Social Determinants  of Health:   none  This note was dictated with voice recognition software.  Despite best efforts at proofreading, errors may have occurred which can change the documentation meaning.          Final Clinical Impression(s) / ED Diagnoses Final diagnoses:  Acute cystitis without hematuria  Hypotension, unspecified hypotension type    Rx / DC Orders ED Discharge Orders     None         Felicie Horning, PA-C 03/27/24 2200    Sallyanne Creamer, DO 03/29/24 2246

## 2024-03-27 NOTE — ED Triage Notes (Signed)
 Pt's girlfriend said pt stepped out of truck and became weak and fell to his knees. Pt did not hit his head or LOC. Pt has dementia. Pt not on blood thinners. Pt's girlfriend brought pt to just be checked out.

## 2024-03-27 NOTE — ED Notes (Signed)
 [facility] called for transport @ 22:08 spoke to [place]

## 2024-03-27 NOTE — Discharge Instructions (Addendum)
 Please go to the ER for further evaluation of your low blood pressure

## 2024-03-27 NOTE — ED Provider Notes (Signed)
 [facility]    CSN: [id number] Arrival date & time: 03/27/24  1708      History   Chief Complaint No chief complaint on file.   HPI Tony Price is a 83 y.o. male with a past medical history of diabetes, hypertension, hyperlipidemia, Alzheimer's presents with family for evaluation of weakness.  Family reports prior to arrival he was getting out of a truck when he became weak and fell to his knees but did not fall further than that.  Family went to help him up and state he said he felt fine and they put him back in the truck and then brought him here.  He only denies any head injury or LOC.  Patient denies any dizziness, headache, chest pain, shortness of breath, visual changes, weakness.  No dysuria.  Is eating and drinking normally.  Denies any recent or current respiratory illnesses.  Family wants to make sure he is okay.  No other concerns.  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   Type II diabetes mellitus (HCC) 02/03/2023    Patient Active Problem List   Diagnosis Date Noted   Hypercholesterolemia 02/03/2023   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   Type II diabetes mellitus (HCC)    Weight loss 04/28/2022   Erectile dysfunction due to arterial insufficiency 09/14/2021   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 Authorizing Provider  acetaminophen  (TYLENOL ) 325 MG tablet Take 2 tablets (650 mg total) by mouth every 6 (six) hours as needed for moderate pain (pain score 4-6). 12/17/23   Adolph Hoop, PA-C  amLODipine  (NORVASC ) 10 MG tablet Take 1 tablet (10 mg total) by mouth daily. 03/15/24   Elvira Hammersmith, MD  Ascorbic Acid (VITAMIN C) 500 MG CAPS See admin instructions.    [provider]  aspirin  81 MG EC tablet 1 tablet    [provider]  atorvastatin  (LIPITOR) 20 MG tablet TAKE 1 TABLET BY MOUTH EVERY DAY 12/20/23   Elvira Hammersmith, MD  blood glucose meter kit and supplies KIT 1 touch ultra meter.   Check blood sugar once daily E11.9 07/14/17   Benjiman Bras, MD  Blood Glucose Monitoring Suppl (ONE TOUCH ULTRA SYSTEM KIT) w/Device KIT Test blood sugar once daily. Dx: E11.9 07/01/16   Benjiman Bras, MD  carboxymethylcellulose (REFRESH PLUS) 0.5 % SOLN 1 drop 2 (two) times daily  as needed. Into both eyes    [provider]  cetirizine  HCl (ZYRTEC ) 1 MG/ML solution Take 5 mLs (5 mg total) by mouth daily. 12/17/23   Adolph Hoop, PA-C  donepezil  (ARICEPT ) 10 MG tablet Take 1 tablet (10 mg total) by mouth daily. 09/28/23   Wertman, Sara E, PA-C  escitalopram  (LEXAPRO ) 5 MG tablet TAKE 1 TABLET (5 MG TOTAL) BY MOUTH DAILY. 10/13/23   Wertman, Sara E, PA-C  glimepiride  (AMARYL ) 2 MG tablet Take 1 tablet (2 mg total) by mouth 2 (two) times daily. 03/13/24   Elvira Hammersmith, MD  glucose blood test strip Test blood sugar once daily.  Dx: E11.9 08/01/23   Elvira Hammersmith, MD  Lancets [facility] ULTRASOFT) lancets Test blood sugar once daily. Dx: E11.9 07/04/17   Benjiman Bras, MD  latanoprost  (XALATAN ) 0.005 % ophthalmic solution 1 drop at bedtime. 08/31/19   [provider]  memantine  (NAMENDA ) 10 MG tablet TAKE 1 TABLET BY MOUTH TWICE A DAY 03/13/24   Wertman, Sara E, PA-C  promethazine -dextromethorphan (PROMETHAZINE -DM) 6.25-15 MG/5ML syrup Take 2.5 mLs by mouth 3 (three) times daily as needed for cough. Patient not taking: Reported on 02/15/2024 12/17/23   Adolph Hoop, PA-C  sildenafil  (VIAGRA ) 100 MG tablet Take 0.5-1 tablets (50-100 mg total) by mouth daily as needed for erectile dysfunction. 10/20/22   Elvira Hammersmith, MD  tamsulosin  (FLOMAX ) 0.4 MG CAPS capsule TAKE 1 CAPSULE BY MOUTH EVERY DAY 12/30/23   Elvira Hammersmith, MD  valsartan  (DIOVAN ) 160 MG tablet TAKE 1 TABLET BY MOUTH EVERY DAY 12/21/23   Elvira Hammersmith, MD    Family History Family History  Problem Relation Age of Onset   Dementia Cousin        maternal; x2    Social History Social History   Tobacco Use   Smoking status: Former    Types: Cigarettes   Smokeless tobacco: Never  Substance Use Topics   Alcohol use: No    Alcohol/week: 0.0 standard drinks of alcohol   Drug use: No     Allergies   Patient has no known allergies.   Review of Systems Review of Systems  Neurological:  Positive for weakness.     Physical Exam Triage Vital Signs ED Triage Vitals  Encounter Vitals Group     BP 03/27/24 1739 (!) 93/51     Systolic BP Percentile --      Diastolic BP Percentile --      Pulse Rate 03/27/24 1739 63     Resp 03/27/24 1739 16     Temp 03/27/24 1739 98.6 F (37 C)     Temp Source 03/27/24 1739 Oral     SpO2 03/27/24 1739 96 %     Weight --      Height --      Head Circumference --      Peak Flow --      Pain Score 03/27/24 1737 0     Pain Loc --      Pain Education --      Exclude from  Growth Chart --    No data found.  Updated Vital Signs BP (!) 96/54 (BP Location: Left Arm)   Pulse 63   Temp 98.6 F (37 C) (Oral)   Resp 16   SpO2 96%   Visual Acuity Right Eye Distance:   Left Eye Distance:   Bilateral Distance:    Right Eye Near:   Left Eye Near:  Bilateral Near:     Physical Exam Vitals and nursing note reviewed.  Constitutional:      General: He is not in acute distress.    Appearance: Normal appearance. He is not ill-appearing or diaphoretic.  HENT:     Head: Normocephalic and atraumatic.  Eyes:     Pupils: Pupils are equal, round, and reactive to light.  Cardiovascular:     Rate and Rhythm: Normal rate and regular rhythm.     Heart sounds: Normal heart sounds.  Pulmonary:     Effort: Pulmonary effort is normal.     Breath sounds: Normal breath sounds.  Skin:    General: Skin is warm and dry.  Neurological:     Mental Status: He is alert. Mental status is at baseline.  Psychiatric:        Mood and Affect: Mood normal.        Behavior: Behavior normal.      UC Treatments / Results  Labs (all labs ordered are listed, but only abnormal results are displayed) Labs Reviewed  POCT FASTING CBG [facility] MANUAL ENTRY - Abnormal; Notable for the following components:      Result Value   POCT Glucose ([facility]) 139 (*)    All other components within normal limits    EKG   Radiology No results found.  Procedures ED EKG  Date/Time: 03/27/2024 5:55 PM  Performed by: Alleen Arbour, NP Authorized by: Alleen Arbour, NP   ECG interpreted by ED Physician in the absence of a cardiologist: no   Interpretation:    Interpretation: normal   Rate:    ECG rate:  61 Rhythm:    Rhythm: sinus rhythm   Ectopy:    Ectopy: none   QRS:    QRS axis:  Normal ST segments:    ST segments:  Normal T waves:    T waves: normal    (including critical care time)  Medications Ordered in UC Medications - No data to display  Initial Impression / Assessment  and Plan / UC Course  I have reviewed the triage vital signs and the nursing notes.  Pertinent labs & imaging results that were available during my care of the patient were reviewed by me and considered in my medical decision making (see chart for details).     I reviewed exam, workup, and concerns with family.  EKG unremarkable.  Blood sugar normal.  Blood pressure low on initial intake and remained low after 2 rechecks.  Discussed with family that this could have contributed to his fall today.  Advised emergency evaluation for further workup of his hypotension and weakness.  Patient and family are in agreement with plan and will POV to the emergency room. Final Clinical Impressions(s) / UC Diagnoses   Final diagnoses:  Weakness  Hypotension, unspecified hypotension type     Discharge Instructions      Please go to the ER for further evaluation of your low blood pressure    ED Prescriptions   None    PDMP not reviewed this encounter.   Alleen Arbour, NP 03/27/24 1807

## 2024-03-27 NOTE — ED Notes (Signed)
 Patient is being discharged from the Urgent Care and sent to the Emergency Department via POV . Per Ramonita Burow, NP, patient is in need of higher level of care due to needing higher level of care. Patient is aware and verbalizes understanding of plan of care.  Vitals:   03/27/24 1739 03/27/24 1804  BP: (!) 93/51 (!) 96/54  Pulse: 63   Resp: 16   Temp: 98.6 F (37 C)   SpO2: 96%

## 2024-03-27 NOTE — ED Triage Notes (Signed)
 Sent from UC for hypotension and weakness , fell on his knees coming out from his car . Hx dementia .

## 2024-03-27 NOTE — ED Notes (Signed)
 Checked off DG chest 2 view complete by mistake- CN Tony Price informed

## 2024-03-28 ENCOUNTER — Observation Stay (HOSPITAL_COMMUNITY)

## 2024-03-28 DIAGNOSIS — N3000 Acute cystitis without hematuria: Secondary | ICD-10-CM | POA: Diagnosis not present

## 2024-03-28 DIAGNOSIS — E861 Hypovolemia: Secondary | ICD-10-CM | POA: Diagnosis not present

## 2024-03-28 DIAGNOSIS — R42 Dizziness and giddiness: Secondary | ICD-10-CM | POA: Diagnosis not present

## 2024-03-28 DIAGNOSIS — R55 Syncope and collapse: Secondary | ICD-10-CM | POA: Diagnosis not present

## 2024-03-28 DIAGNOSIS — I951 Orthostatic hypotension: Secondary | ICD-10-CM

## 2024-03-28 DIAGNOSIS — I350 Nonrheumatic aortic (valve) stenosis: Secondary | ICD-10-CM

## 2024-03-28 HISTORY — DX: Hypovolemia: E86.1

## 2024-03-28 HISTORY — DX: Nonrheumatic aortic (valve) stenosis: I35.0

## 2024-03-28 HISTORY — DX: Dizziness and giddiness: R42

## 2024-03-28 LAB — BASIC METABOLIC PANEL WITH GFR
Anion gap: 5 (ref 5–15)
BUN: 22 mg/dL (ref 8–23)
CO2: 27 mmol/L (ref 22–32)
Calcium: 8.4 mg/dL — ABNORMAL LOW (ref 8.9–10.3)
Chloride: 107 mmol/L (ref 98–111)
Creatinine, Ser: 1.36 mg/dL — ABNORMAL HIGH (ref 0.61–1.24)
GFR, Estimated: 52 mL/min — ABNORMAL LOW (ref >60)
Glucose, Bld: 125 mg/dL — ABNORMAL HIGH (ref 70–99)
Potassium: 4.1 mmol/L (ref 3.5–5.1)
Sodium: 139 mmol/L (ref 135–145)

## 2024-03-28 LAB — CBC
HCT: 33.8 % — ABNORMAL LOW (ref 39.0–52.0)
Hemoglobin: 10.4 g/dL — ABNORMAL LOW (ref 13.0–17.0)
MCH: 26.5 pg (ref 26.0–34.0)
MCHC: 30.8 g/dL (ref 30.0–36.0)
MCV: 86.2 fL (ref 80.0–100.0)
Platelets: 279 10*3/uL (ref 150–400)
RBC: 3.92 MIL/uL — ABNORMAL LOW (ref 4.22–5.81)
RDW: 14.8 % (ref 11.5–15.5)
WBC: 8.7 10*3/uL (ref 4.0–10.5)
nRBC: 0 % (ref 0.0–0.2)

## 2024-03-28 LAB — GLUCOSE, CAPILLARY: Glucose-Capillary: 133 mg/dL — ABNORMAL HIGH (ref 70–99)

## 2024-03-28 LAB — MAGNESIUM: Magnesium: 2.2 mg/dL (ref 1.7–2.4)

## 2024-03-28 LAB — PHOSPHORUS: Phosphorus: 1.7 mg/dL — ABNORMAL LOW (ref 2.5–4.6)

## 2024-03-28 MED ORDER — SULFAMETHOXAZOLE-TRIMETHOPRIM 800-160 MG PO TABS: 1.0000 | ORAL_TABLET | Freq: Two times a day (BID) | ORAL | 0 refills | Status: DC

## 2024-03-28 MED ORDER — ASPIRIN 81 MG PO TBEC
81.0000 mg | DELAYED_RELEASE_TABLET | Freq: Every day | ORAL | Status: DC
  Administered 2024-03-28: 81 mg via ORAL
  Filled 2024-03-28: qty 1

## 2024-03-28 MED ORDER — INSULIN ASPART 100 UNIT/ML IJ SOLN: 0.0000 [IU] | Freq: Three times a day (TID) | INTRAMUSCULAR | Status: DC

## 2024-03-28 NOTE — Progress Notes (Signed)
   03/28/24 1054  TOC Brief Assessment  Insurance and Status Reviewed  Patient has primary care physician Yes  Home environment has been reviewed Home w/ spouse  Prior level of function: Independent  Prior/Current Home Services No current home services  Social Drivers of Health Review SDOH reviewed no interventions necessary  Readmission risk has been reviewed Yes  Transition of care needs no transition of care needs at this time

## 2024-03-28 NOTE — Progress Notes (Signed)
 Orthostatic   Lying : B/P 151/71 HR 65  Sitting: B/P 142/66 HR 64  Standing : B/P 132/ 66 HR 66

## 2024-03-28 NOTE — Plan of Care (Signed)

## 2024-03-28 NOTE — H&P (Signed)
 History and Physical    Tony Price:[id number] DOB: Mar 08, 1941 DOA: 03/27/2024  PCP: Tony Hammersmith, MD   Patient coming from: Transfer from [facility] ED    Chief Complaint:  Chief Complaint  Patient presents with   Hypotension    HPI: History limited by patients dementia  Tony Price is a 83 y.o. 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 He reports feeling well and just going to the ground after stepping out of the car. Per wife he had been in normal state of health although takes in limited amount of water usually. Today was standing in kitchen and just appeared slightly out of it. Then went out in the evening and when stepping out of the car, stepped out and then had to hold onto the car and went down on his knees and wife helped him up. Did not lose consciousness or sustain any injuries. He denies any associated light-headedness or dizziness, chest pain, palipitations. No recent illness, specifically denies any urinary changes. He was seen at urgent care and was hypotensive 90/50s and sent to ER, then transferred from [facility].    Review of Systems:  ROS complete and negative except as marked above   No Known Allergies  Prior to Admission medications   Medication Sig Start Date End Date Taking? Authorizing Provider  acetaminophen  (TYLENOL ) 325 MG tablet Take 2 tablets (650 mg total) by mouth every 6 (six) hours as needed for moderate pain (pain score 4-6). 12/17/23   Adolph Hoop, PA-C  amLODipine  (NORVASC ) 10 MG tablet Take 1 tablet (10 mg total) by mouth daily. 03/15/24   Tony Hammersmith, MD  Ascorbic Acid (VITAMIN C) 500 MG CAPS See admin instructions.    [provider]  aspirin  81 MG EC tablet 1 tablet    [provider]  atorvastatin  (LIPITOR) 20 MG tablet TAKE 1 TABLET BY MOUTH EVERY DAY 12/20/23   Tony Hammersmith,  MD  blood glucose meter kit and supplies KIT 1 touch ultra meter.   Check blood sugar once daily E11.9 07/14/17   Tony Bras, MD  Blood Glucose Monitoring Suppl (ONE TOUCH ULTRA SYSTEM KIT) w/Device KIT Test blood sugar once daily. Dx: E11.9 07/01/16   Tony Bras, MD  carboxymethylcellulose (REFRESH PLUS) 0.5 % SOLN 1 drop 2 (two) times daily as needed. Into both eyes    [provider]  cetirizine  HCl (ZYRTEC ) 1 MG/ML solution Take 5 mLs (5 mg total) by mouth daily. 12/17/23   Adolph Hoop, PA-C  donepezil  (ARICEPT ) 10 MG tablet Take 1 tablet (10 mg total) by mouth daily. 09/28/23   Price, Tony E, PA-C  escitalopram  (LEXAPRO ) 5 MG tablet TAKE 1 TABLET (5 MG TOTAL) BY MOUTH DAILY. 10/13/23   Price, Tony E, PA-C  glimepiride  (AMARYL ) 2 MG tablet Take 1 tablet (2 mg total) by mouth 2 (two) times daily. 03/13/24   Tony Hammersmith, MD  glucose blood test strip Test blood sugar once daily. Dx: E11.9 08/01/23   Tony Hammersmith, MD  Lancets [facility] ULTRASOFT) lancets Test blood sugar once daily. Dx: E11.9 07/04/17   Tony Bras, MD  latanoprost  (XALATAN ) 0.005 % ophthalmic solution 1 drop at bedtime. 08/31/19   [provider]  memantine  (NAMENDA ) 10 MG tablet TAKE 1 TABLET BY MOUTH TWICE A DAY 03/13/24   Price, Tony E, PA-C  promethazine -dextromethorphan (PROMETHAZINE -DM) 6.25-15 MG/5ML syrup Take 2.5 mLs by mouth 3 (three) times daily as needed for cough. Patient not taking: Reported on 02/15/2024 12/17/23   Adolph Hoop, PA-C  sildenafil  (VIAGRA ) 100 MG tablet Take 0.5-1 tablets (50-100 mg total) by mouth daily as needed for erectile dysfunction. 10/20/22   Price, Tony Jose, MD  tamsulosin  (FLOMAX ) 0.4 MG CAPS capsule TAKE 1 CAPSULE BY MOUTH EVERY DAY 12/30/23   Tony Hammersmith, MD  valsartan  (DIOVAN ) 160 MG tablet TAKE 1 TABLET BY MOUTH EVERY DAY 12/21/23   Tony Hammersmith, MD    Past Medical History:  Diagnosis Date   Acute upper  respiratory infection 10/21/2017   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   Chest pain    Diabetic retinopathy (HCC) 03/31/2016   Both eyes mild background diabetic retinopathy   Dyslipidemia associated with type 2 diabetes mellitus (HCC) 11/17/2014   Erectile dysfunction due to arterial insufficiency 09/14/2021   History of colonic polyps 11/04/2020   Hypercholesterolemia    Hypertension associated with diabetes (HCC) 11/17/2014   Kidney stones    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 His smoking use included cigarettes. He has never used smokeless tobacco. He reports that he does not drink alcohol and does not use drugs.  Family History  Problem Relation Age of Onset   Dementia Cousin        maternal; x2     Physical Exam: Vitals:   03/27/24 2030 03/27/24 2045 03/27/24 2100 03/27/24 2337  BP: 137/80 (!) 145/76 125/61 (!) 151/71  Pulse:  (!) 53 61 65  Resp:    16  Temp:    98.3 F (36.8 C)  TempSrc:    Oral  SpO2:  100% 97% 100%  Weight:        Gen: Awake, alert, chronically ill appearing.   CV: Regular, normal S1, S2, 2/6 SEM best at RUSB  Resp: Normal WOB, CTAB  Abd: Flat, normoactive, nontender MSK: Symmetric, no edema  Skin: No rashes or lesions to exposed skin  Neuro: Alert and interactive  Psych: euthymic, appropriate    Data review:   Labs reviewed, notable for:   Bicarb 21, AG 13.  UA consistent with infection    Micro:  Results for orders placed or performed in visit on 04/29/22  Urine culture     Status: Abnormal   Collection Time: 04/29/22  4:49 PM   Specimen: Urine  Result Value Ref Range Status   MICRO NUMBER: [id number]  Final   SPECIMEN QUALITY:  Adequate  Final   Sample Source URINE  Final   STATUS: FINAL  Final   ISOLATE 1: Escherichia coli (A)  Final    Comment: Greater than 100,000 CFU/mL of Escherichia coli      Susceptibility   Escherichia coli - URINE CULTURE, REFLEX    AMOX/CLAVULANIC 16 Intermediate     AMPICILLIN >=32 Resistant     AMPICILLIN/SULBACTAM >=32 Resistant     CEFAZOLIN* <=4 Not Reportable      * For infections other than uncomplicated UTI caused by E. coli, K. pneumoniae or P. mirabilis: Cefazolin is resistant if MIC > or =  8 mcg/mL. (Distinguishing susceptible versus intermediate for isolates with MIC < or = 4 mcg/mL requires additional testing.) For uncomplicated UTI caused by E. coli, K. pneumoniae or P. mirabilis: Cefazolin is susceptible if MIC <32 mcg/mL and predicts susceptible to the oral agents cefaclor, cefdinir, cefpodoxime, cefprozil, cefuroxime , cephalexin  and loracarbef.     CEFTAZIDIME <=1 Sensitive     CEFEPIME <=1 Sensitive     CEFTRIAXONE <=1 Sensitive     CIPROFLOXACIN  >=4 Resistant     LEVOFLOXACIN >=8 Resistant     GENTAMICIN >=16 Resistant     IMIPENEM  <=0.25 Sensitive     NITROFURANTOIN <=16 Sensitive     PIP/TAZO 8 Sensitive     TOBRAMYCIN >=16 Resistant     TRIMETH /SULFA * <=20 Sensitive      * For infections other than uncomplicated UTI caused by E. coli, K. pneumoniae or P. mirabilis: Cefazolin is resistant if MIC > or = 8 mcg/mL. (Distinguishing susceptible versus intermediate for isolates with MIC < or = 4 mcg/mL requires additional testing.) For uncomplicated UTI caused by E. coli, K. pneumoniae or P. mirabilis: Cefazolin is susceptible if MIC <32 mcg/mL and predicts susceptible to the oral agents cefaclor, cefdinir, cefpodoxime, cefprozil, cefuroxime , cephalexin  and loracarbef. Legend: S = Susceptible  I = Intermediate R = Resistant  NS = Not susceptible * = Not tested  NR = Not reported **NN = See antimicrobic comments     Imaging reviewed:  CT Chest  W Contrast Result Date: 03/27/2024 CLINICAL DATA:  Abnormal x-ray hypotension EXAM: CT CHEST WITH CONTRAST TECHNIQUE: Multidetector CT imaging of the chest was performed during intravenous contrast administration. RADIATION DOSE REDUCTION: This exam was performed according to the departmental dose-optimization program which includes automated exposure control, adjustment of the mA and/or kV according to patient size and/or use of iterative reconstruction technique. 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 Coronary vascular calcification. Normal cardiac size. No pericardial effusion Mediastinum/Nodes: No enlarged mediastinal, hilar, or axillary lymph nodes. Thyroid gland, trachea, and esophagus demonstrate no significant findings. Lungs/Pleura: Lungs are clear. No pleural effusion or pneumothorax. Upper Abdomen: No acute abnormality. Musculoskeletal: No chest wall abnormality. No acute or significant osseous findings. IMPRESSION: 1. No CT evidence for acute intrathoracic abnormality. No significant mediastinal adenopathy. 2. Coronary vascular calcification. Electronically Signed   By: Esmeralda Hedge M.D.   On: 03/27/2024 22:21   DG Chest 2 View Result Date: 03/27/2024 CLINICAL DATA:  Hypotension and weakness EXAM: CHEST - 2 VIEW COMPARISON:  Chest radiograph dated 04/28/2022 FINDINGS: Normal lung volumes. Increased density in the right paratracheal region. No pleural effusion or pneumothorax. The heart size and mediastinal contours are within normal limits. Right humeral bone anchors. IMPRESSION: Increased density in the right paratracheal region, which may represent adenopathy. Recommend further evaluation with chest CT. Electronically Signed   By: Limin  Xu M.D.   On: 03/27/2024 20:28    EKG:  Personally reviewed, SR with PRWP, T wave flattening / notching laterally.    ED Course:  Treated with 1 L  NS and Ceftriaxone.     Assessment/Plan:  83 y.o. 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   Near-syncope  Hypotension, hypovolemic - resolved  Hx mild AS by TTE '19 Near syncopal and helped to ground after getting out of car  earlier, no recent illness. BP 90/50s at urgent care -> resolved with 1 L IVF at outside hospital. Other vs wnl. No prodrome. Suspect etiology orthostatic and hypovolemic in setting of dehydration, infection with UTI, and antihypertensive medications. Less likely cardiac although did have mild AS which has not been reevaluated since '19 and worth reinvestigating. EKG is SR, borderline bradycardic on subsequent vitals.  - S/p 1 L IV fluid and blood pressure normalized, continue oral hydration. - Hold home antihypertensives (amlodipine , vaslartan) pending orthostatics. May be prudent to temporarily hold one agent at time of discharge to avoid recurrent hypotension outpatient.  - Telemonitoring - Orthostatics tonight and repeat in a.m. - TTE to reinvestigate AS - Management of UTI per below  Urinary tract infection Limited urinary symptoms but UA consistent. With his hypotension at presentation will treat as true UTI.  -Continue ceftriaxone, follow-up urine culture  Chronic medical problems: Alzheimer's dementia: Continue memantine , donepezil  CKD stage 3a: Baseline Cr near 1.2 - 1.3  Hypertension: Holding home amlodipine , valsartan  with essential hypertension pending orthostatics.  Consider temporary hold on 1 agent at time of discharge. Hyperlipidemia: Continue home atorvastatin  Diabetes: Hold home glimepiride , SSI while inpatient BPH: Continue home tamsulosin .  Body mass index is 23.73 kg/m.    DVT prophylaxis:  Lovenox  Code Status:  Full Code Diet:  Diet Orders (From admission, onward)     Start     Ordered   03/27/24 2346  Diet regular  Room service appropriate? Yes; Fluid consistency: Thin  Diet effective now       Question Answer Comment  Room service appropriate? Yes   Fluid consistency: Thin      03/27/24 2346           Family Communication:  Yes discussed with son and wife at bedside   Consults:  None   Admission status:   Observation, Telemetry bed  Severity of Illness: The appropriate patient status for this patient is OBSERVATION. Observation status is judged to be reasonable and necessary in order to provide the required intensity of service to ensure the patient's safety. The patient's presenting symptoms, physical exam findings, and initial radiographic and laboratory data in the context of their medical condition is felt to place them at decreased risk for further clinical deterioration. Furthermore, it is anticipated that the patient will be medically stable for discharge from the hospital within 2 midnights of admission.    Arnulfo Larch, MD [facility]  How to contact the [facility] Attending or Consulting provider 7A - 7P or covering provider during after hours 7P -7A, for this patient.  Check the care team in [facility] and look for a) attending/consulting [facility] provider listed and b) the [facility] team listed Log into [email or web address] and use [facility]'s universal password to access. If you do not have the password, please contact the hospital operator. Locate the [facility] provider you are looking for under [facility] and page to a number that you can be directly reached. If you still have difficulty reaching the provider, please page the [facility] (Director on Call) for the Hospitalists listed on amion for assistance.  03/28/2024, 12:11 AM

## 2024-03-28 NOTE — Discharge Summary (Signed)
 Physician Discharge Summary   Patient: Tony Price MRN: [id number] DOB: 05-17-1941  Admit date:     03/27/2024  Discharge date: 03/28/24  Discharge Physician: Jodeane Mulligan   PCP: Elvira Hammersmith, MD   Recommendations at discharge:    Pt to be discharged home.   If you experience worsening fever, chills, chest pain, shortness of breath, or other concerning symptoms, please call your PCP or go to the emergency department immediately.  Discharge Diagnoses: Active Problems:   Urinary tract infection   Postural dizziness with presyncope   Aortic stenosis   Hypotension due to hypovolemia  Principal Problem (Resolved):   Syncope   Hospital Course:  Tony Price is a 83 y.o. 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 He reports feeling well and just going to the ground after stepping out of the car. Per wife he had been in normal state of health although takes in limited amount of water usually. Today was standing in kitchen and just appeared slightly out of it. Then went out in the evening and when stepping out of the car, stepped out and then had to hold onto the car and went down on his knees and wife helped him up. Did not lose consciousness or sustain any injuries. He denies any associated light-headedness or dizziness, chest pain, palipitations. No recent illness, specifically denies any urinary changes. He was seen at urgent care and was hypotensive 90/50s and sent to ER, then transferred from [facility].   Assessment and Plan:  Repeat assessment performed greater than 8 hours after initial presentation.  Hypovolemic hypotension with near syncope - Likely secondary to decreased p.o. intake.  Orthostatic on presentation.  Resolved after IV fluid hydration.  Patient feeling well, ambulatory, eager for discharge home.  Family at bedside eager as well.   No abnormalities observed on telemetry.  Encourage continued oral hydration.  Can restart antihypertensives zotepine, valsartan  tomorrow 6/12.  Acute cystitis - UA showing nitrites, LE, WBCs, bacteria.  Likely etiology of patient's symptoms above.  Initiated on empiric ceftriaxone.  Will transition patient to p.o. Bactrim  to take as directed upon discharge.    Consultants: None Procedures performed: None Disposition: Home Diet recommendation:  Discharge Diet Orders (From admission, onward)     Start     Ordered   03/28/24 0000  Diet - low sodium heart healthy        03/28/24 1027           Cardiac and Carb modified diet  DISCHARGE MEDICATION: Allergies as of 03/28/2024   No Known Allergies      Medication List     TAKE these medications    amLODipine  10 MG tablet Commonly known as: NORVASC  Take 1 tablet (10 mg total) by mouth daily.   aspirin  EC 81 MG tablet Take 81 mg by mouth at bedtime.   atorvastatin  20 MG tablet Commonly known as: LIPITOR TAKE 1 TABLET BY MOUTH EVERY DAY   carboxymethylcellulose 0.5 % Soln Commonly known as: REFRESH PLUS Place 1 drop into both eyes daily.   cetirizine  10 MG tablet Commonly known as: ZYRTEC  Take 10 mg by mouth at bedtime.   donepezil  10 MG tablet Commonly known as: ARICEPT  Take 1 tablet (10 mg total) by mouth daily.   escitalopram  5 MG tablet Commonly known as: LEXAPRO  TAKE 1 TABLET (5 MG TOTAL) BY MOUTH  DAILY.   glimepiride  2 MG tablet Commonly known as: AMARYL  Take 1 tablet (2 mg total) by mouth 2 (two) times daily.   latanoprost  0.005 % ophthalmic solution Commonly known as: XALATAN  Place 1 drop into both eyes at bedtime.   memantine  10 MG tablet Commonly known as: NAMENDA  TAKE 1 TABLET BY MOUTH TWICE A DAY   sulfamethoxazole -trimethoprim  800-160 MG tablet Commonly known as: Bactrim  DS Take 1 tablet by mouth 2 (two) times daily.   tamsulosin  0.4 MG Caps capsule Commonly known as: FLOMAX  TAKE 1  CAPSULE BY MOUTH EVERY DAY What changed: when to take this   valsartan  160 MG tablet Commonly known as: DIOVAN  TAKE 1 TABLET BY MOUTH EVERY DAY   Vitamin C 500 MG Caps Take 1 tablet by mouth at bedtime.         Discharge Exam: Filed Weights   03/27/24 1849  Weight: 66.7 kg    GENERAL:  Alert, pleasant, no acute distress  HEENT:  EOMI CARDIOVASCULAR:  RRR, no murmurs appreciated RESPIRATORY:  Clear to auscultation, no wheezing, rales, or rhonchi GASTROINTESTINAL:  Soft, nontender, nondistended EXTREMITIES:  No LE edema bilaterally NEURO:  No new focal deficits appreciated SKIN:  No rashes noted PSYCH:  Appropriate mood and affect     Condition at discharge: improving  The results of significant diagnostics from this hospitalization (including imaging, microbiology, ancillary and laboratory) are listed below for reference.   Imaging Studies: CT Chest W Contrast Result Date: 03/27/2024 CLINICAL DATA:  Abnormal x-ray hypotension EXAM: CT CHEST WITH CONTRAST TECHNIQUE: Multidetector CT imaging of the chest was performed during intravenous contrast administration. RADIATION DOSE REDUCTION: This exam was performed according to the departmental dose-optimization program which includes automated exposure control, adjustment of the mA and/or kV according to patient size and/or use of iterative reconstruction technique. 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 Coronary vascular calcification. Normal cardiac size. No pericardial effusion Mediastinum/Nodes: No enlarged mediastinal, hilar, or axillary lymph nodes. Thyroid gland, trachea, and esophagus demonstrate no significant findings. Lungs/Pleura: Lungs are clear. No pleural effusion or pneumothorax. Upper Abdomen: No acute abnormality. Musculoskeletal: No chest wall abnormality. No acute or significant osseous findings.  IMPRESSION: 1. No CT evidence for acute intrathoracic abnormality. No significant mediastinal adenopathy. 2. Coronary vascular calcification. Electronically Signed   By: Esmeralda Hedge M.D.   On: 03/27/2024 22:21   DG Chest 2 View Result Date: 03/27/2024 CLINICAL DATA:  Hypotension and weakness EXAM: CHEST - 2 VIEW COMPARISON:  Chest radiograph dated 04/28/2022 FINDINGS: Normal lung volumes. Increased density in the right paratracheal region. No pleural effusion or pneumothorax. The heart size and mediastinal contours are within normal limits. Right humeral bone anchors. IMPRESSION: Increased density in the right paratracheal region, which may represent adenopathy. Recommend further evaluation with chest CT. Electronically Signed   By: Limin  Xu M.D.   On: 03/27/2024 20:28    Microbiology: Results for orders placed or performed during the hospital encounter of 03/27/24  Blood culture (routine x 2)     Status: None (Preliminary result)   Collection Time: 03/27/24 10:50 PM   Specimen: BLOOD RIGHT FOREARM  Result Value Ref Range Status   Specimen Description   Final    BLOOD RIGHT FOREARM Performed at [facility], [address], [place], [place] [address]    Special Requests   Final    BOTTLES DRAWN AEROBIC AND ANAEROBIC Blood Culture  results may not be optimal due to an inadequate volume of blood received in culture bottles Performed at [facility], [address] [place], [place] [address]    Culture PENDING  Incomplete   Report Status PENDING  Incomplete    Labs: CBC: Recent Labs  Lab 03/27/24 1953 03/28/24 0546  WBC 8.1 8.7  NEUTROABS 6.2  --   HGB 11.4* 10.4*  HCT 36.1* 33.8*  MCV 83.8 86.2  PLT 295 279   Basic Metabolic Panel: Recent Labs  Lab 03/27/24 1953 03/28/24 0546  NA 142 139  K 4.2 4.1  CL 108 107  CO2 21* 27  GLUCOSE 136* 125*  BUN 24* 22  CREATININE 1.28* 1.36*  CALCIUM  8.7* 8.4*  MG  --  2.2  PHOS  --  1.7*   Liver Function  Tests: Recent Labs  Lab 03/27/24 1953  AST 17  ALT 10  ALKPHOS 81  BILITOT 0.3  PROT 6.7  ALBUMIN 3.7   CBG: Recent Labs  Lab 03/28/24 0732  GLUCAP 133*    Discharge time spent: 30 minutes.  Length of inpatient stay: 0 days  Signed: Jodeane Mulligan, DO [facility] 03/28/2024

## 2024-03-29 ENCOUNTER — Ambulatory Visit: Payer: Medicare Other | Admitting: Physician Assistant

## 2024-03-29 ENCOUNTER — Telehealth: Payer: Self-pay

## 2024-03-29 NOTE — Transitions of Care (Post Inpatient/ED Visit) (Signed)
 03/29/2024  Name: Tony Price MRN: [id number] DOB: 1941/07/05  Today's TOC FU Call Status: Today's TOC FU Call Status:: Successful TOC FU Call Completed TOC FU Call Complete Date: 03/29/24 Patient's Name and Date of Birth confirmed.  Transition Care Management Follow-up Telephone Call Date of Discharge: 03/28/24 Discharge Facility: [facility]) Type of Discharge: Inpatient Admission Primary Inpatient Discharge Diagnosis:: syncope How have you been since you were released from the hospital?: Better Any questions or concerns?: No  Items Reviewed: Did you receive and understand the discharge instructions provided?: Yes Medications obtained,verified, and reconciled?: Yes (Medications Reviewed) Any new allergies since your discharge?: No Dietary orders reviewed?: Yes Do you have support at home?: Yes People in Home [RPT]: spouse  Medications Reviewed Today: Medications Reviewed Today     Reviewed by Darrall Ellison, LPN (Licensed Practical Nurse) on 03/29/24 at 1017  Med List Status: <None>   Medication Order Taking? Sig Documenting Provider Last Dose Status Informant  amLODipine  (NORVASC ) 10 MG tablet [id number] Yes Take 1 tablet (10 mg total) by mouth daily. Elvira Hammersmith, MD  Active Self, Spouse/Significant Other, Pharmacy Records  Ascorbic Acid (VITAMIN C) 500 MG CAPS [id number] Yes Take 1 tablet by mouth at bedtime. [provider]  Active Self, Spouse/Significant Other, Pharmacy Records  aspirin  81 MG EC tablet [id number] Yes Take 81 mg by mouth at bedtime. [provider]  Active Self, Spouse/Significant Other, Pharmacy Records  atorvastatin  (LIPITOR) 20 MG tablet [id number] Yes TAKE 1 TABLET BY MOUTH EVERY DAY Sagardia, Isidro Margo, MD  Active Self, Spouse/Significant Other, Pharmacy Records  carboxymethylcellulose (REFRESH PLUS) 0.5 % SOLN [id number] Yes Place 1 drop into both eyes daily. [provider]  Active Self,  Spouse/Significant Other, Pharmacy Records  cetirizine  (ZYRTEC ) 10 MG tablet [id number] Yes Take 10 mg by mouth at bedtime. [provider]  Active Self, Spouse/Significant Other, Pharmacy Records  donepezil  (ARICEPT ) 10 MG tablet [id number] Yes Take 1 tablet (10 mg total) by mouth daily. Wertman, Sara E, PA-C  Active Self, Spouse/Significant Other, Pharmacy Records  escitalopram  (LEXAPRO ) 5 MG tablet [id number] Yes TAKE 1 TABLET (5 MG TOTAL) BY MOUTH DAILY. Wertman, Sara E, PA-C  Active Self, Spouse/Significant Other, Pharmacy Records  glimepiride  (AMARYL ) 2 MG tablet [id number] Yes Take 1 tablet (2 mg total) by mouth 2 (two) times daily. Sagardia, Miguel Jose, MD  Active Self, Spouse/Significant Other, Pharmacy Records  latanoprost  (XALATAN ) 0.005 % ophthalmic solution [id number] Yes Place 1 drop into both eyes at bedtime. [provider]  Active Self, Spouse/Significant Other, Pharmacy Records  memantine  (NAMENDA ) 10 MG tablet [id number] Yes TAKE 1 TABLET BY MOUTH TWICE A DAY Alane Allen Sara E, PA-C  Active Self, Spouse/Significant Other, Pharmacy Records  sulfamethoxazole -trimethoprim  (BACTRIM  DS) 800-160 MG tablet [id number] Yes Take 1 tablet by mouth 2 (two) times daily. Jodeane Mulligan, DO  Active   tamsulosin  (FLOMAX ) 0.4 MG CAPS capsule [id number] Yes TAKE 1 CAPSULE BY MOUTH EVERY DAY  Patient taking differently: Take 0.4 mg by mouth at bedtime.   Sagardia, Miguel Jose, MD  Active Self, Spouse/Significant Other, Pharmacy Records  valsartan  (DIOVAN ) 160 MG tablet [id number] Yes TAKE 1 TABLET BY MOUTH EVERY DAY Sagardia, Isidro Margo, MD  Active Self, Spouse/Significant Other, Pharmacy Records            Home Care and Equipment/Supplies: Were Home Health Services Ordered?: NA Any new equipment or medical supplies ordered?: NA  Functional Questionnaire: Do you need assistance with bathing/showering or dressing?: No Do you need  assistance with meal preparation?: No Do you need  assistance with eating?: No Do you have difficulty maintaining continence: No Do you need assistance with getting out of bed/getting out of a chair/moving?: No Do you have difficulty managing or taking your medications?: No  Follow up appointments reviewed: PCP Follow-up appointment confirmed?: Yes Date of PCP follow-up appointment?: 04/04/24 Follow-up Provider: [facility] Follow-up appointment confirmed?: NA Do you need transportation to your follow-up appointment?: No Do you understand care options if your condition(s) worsen?: Yes-patient verbalized understanding    SIGNATURE Darrall Ellison, LPN [facility] Nurse Health Advisor Direct Dial [phone]

## 2024-03-30 ENCOUNTER — Ambulatory Visit (INDEPENDENT_AMBULATORY_CARE_PROVIDER_SITE_OTHER): Admitting: Psychology

## 2024-03-30 ENCOUNTER — Encounter: Payer: Self-pay | Admitting: Psychology

## 2024-03-30 ENCOUNTER — Ambulatory Visit: Admitting: Psychology

## 2024-03-30 DIAGNOSIS — L810 Postinflammatory hyperpigmentation: Secondary | ICD-10-CM | POA: Insufficient documentation

## 2024-03-30 DIAGNOSIS — G309 Alzheimer's disease, unspecified: Secondary | ICD-10-CM

## 2024-03-30 DIAGNOSIS — I679 Cerebrovascular disease, unspecified: Secondary | ICD-10-CM | POA: Diagnosis not present

## 2024-03-30 DIAGNOSIS — F0280 Dementia in other diseases classified elsewhere without behavioral disturbance: Secondary | ICD-10-CM | POA: Insufficient documentation

## 2024-03-30 DIAGNOSIS — L281 Prurigo nodularis: Secondary | ICD-10-CM | POA: Insufficient documentation

## 2024-03-30 DIAGNOSIS — R4189 Other symptoms and signs involving cognitive functions and awareness: Secondary | ICD-10-CM

## 2024-03-30 HISTORY — DX: Dementia in other diseases classified elsewhere, unspecified severity, without behavioral disturbance, psychotic disturbance, mood disturbance, and anxiety: F02.80

## 2024-03-30 NOTE — Progress Notes (Signed)
   Psychometrician Note   Cognitive testing was administered to Tony Price by Arnulfo Larch, B.S. (psychometrist) under the supervision of Dr. Melville Stade, Ph.D., ABPP, licensed psychologist on 03/30/2024. Tony Price did not appear overtly distressed by the testing session per behavioral observation or responses across self-report questionnaires. Rest breaks were offered.    The battery of tests administered was selected by Dr. Melville Stade, Ph.D., ABPP with consideration to Tony Price current level of functioning, the nature of his symptoms, emotional and behavioral responses during interview, level of literacy, observed level of motivation/effort, and the nature of the referral question. This battery was communicated to the psychometrist. Communication between Dr. Melville Stade, Ph.D., ABPP and the psychometrist was ongoing throughout the evaluation and Dr. Melville Stade, Ph.D., ABPP was immediately accessible at all times. Dr. Zachary C. Merz, Ph.D., ABPP provided supervision to the psychometrist on the date of this service to the extent necessary to assure the quality of all services provided.    Tony Price will return within approximately 1-2 weeks for an interactive feedback session with Tony Price at which time his test performances, clinical impressions, and treatment recommendations will be reviewed in detail. Tony Price understands he can contact our office should he require our assistance before this time.  A total of 125 minutes of billable time were spent face-to-face with Tony Price by the psychometrist. This includes both test administration and scoring time. Billing for these services is reflected in the clinical report generated by Dr. Melville Stade, Ph.D., ABPP  This note reflects time spent with the psychometrician and does not include test scores or any clinical interpretations made by Tony Price. The full report will follow in a separate note.

## 2024-03-30 NOTE — Progress Notes (Unsigned)
 NEUROPSYCHOLOGICAL EVALUATION [place]. [facility] Department of Neurology  Date of Evaluation: March 30, 2024  Reason for Referral:   Tony Price is a 83 y.o. 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   Assessment and Plan:   Clinical Impression(s): Tony Price pattern of performance is suggestive of severe impairment surrounding all aspects of learning and memory. An isolated impairment across receptive language was likely caused by memory impairment. Additional weaknesses were exhibited across complex attention, executive functioning, and semantic fluency. Performance variability was exhibited across processing speed, confrontation naming, and visuospatial abilities. Performances were appropriate relative to age-matched peers across basic attention and phonemic fluency. Functionally, Tony Price did not describe prominent functional decline. However, medications have had to be organized into pre-packaged pill packs, he has limited driving, and bills have essentially all been placed on auto-draft. 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  Relative to his previous evaluation in April 2024, decline was exhibited across attention/concentration, receptive language, and aspects of memory, particularly those surrounding recognition/consolidation. Outside of this, other assessed domains exhibited relative stability.  Regarding the cause for memory impairment and  progressive decline over time, primary concerns continue to surround underlying Alzheimer's disease. Across memory testing, Tony Price did not benefit from repeated exposure to information across learning trials, was fully amnestic (i.e., 0% retention) across all memory tasks after a brief delay, and performed poorly across yes/no recognition trials. 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 Impairments and/or weakness surrounding semantic fluency and confrontation naming would follow typical disease progression. There could be a vascular contribution given prior neuroimaging suggesting moderate microvascular ischemic disease. However, this would not explain amnestic memory and other patterns of performance in isolation. Overall, underlying Alzheimer's disease, potentially made worse by his cerebrovascular health, continues to appear the most likely cause for ongoing impairment based upon currently available data. Continued medical monitoring will be important moving forward.   Recommendations: Tony Price has already been prescribed a medication aimed to address memory loss and concerns surrounding Alzheimer's disease (i.e., donepezil /Aricept  and memantine /Namenda ). He is encouraged to continue taking these medications as prescribed. It is important to highlight that these medications have been shown to slow functional decline in some individuals. There is no current treatment which can stop or reverse cognitive decline when caused by a neurodegenerative illness.   Performance across neurocognitive testing is not a strong predictor of an individual's safety operating a motor vehicle. With that being said, given the extent of going impairment, I do have concerns surrounding his ability to consistently drive in a safe manner. It is my recommendation that he abstain from driving behaviors pending the  results of a formalized driving evaluation. He and his family could reach out to the following agencies: [employer] in [place]: [phone] [facility]: [phone] [facility]: [phone] [facility]: [phone] or [phone]  Should there be progression of current deficits over time, Tony Price is unlikely to regain any independent living skills lost. Therefore, it is recommended that  he remain as involved as possible in all aspects of household chores, finances, and medication management, with supervision to ensure adequate performance. He will likely benefit from the establishment and maintenance of a routine in order to maximize his functional abilities over time.  It will be important for Tony Price to have another person with him when in situations where he may need to process information, weigh the pros and cons of different options, and make decisions, in order to ensure that he fully understands and recalls all information to be considered.  If not already done, Tony Price and his family may want to discuss his wishes regarding durable power of attorney and medical decision making, so that he can have input into these choices. If they require legal assistance with this, long-term care resource access, or other aspects of estate planning, they could reach out to [employer] at [phone] for a free consultation. Additionally, they may wish to discuss future plans for caretaking and seek out community options for in home/residential care should they become necessary.  Tony Price is encouraged to attend to lifestyle factors for brain health (e.g., regular physical exercise, good nutrition habits and consideration of the MIND-DASH diet, regular participation in cognitively-stimulating activities, and general stress management techniques), which are likely to have benefits for both emotional adjustment and cognition. Optimal  control of vascular risk factors (including safe cardiovascular exercise and adherence to dietary recommendations) is encouraged. Continued participation in activities which provide mental stimulation and social interaction is also recommended.   Important information should be provided to Mr. Wight in written format in all instances. This information should be placed in a highly frequented and easily visible location within his home to promote recall. External strategies such as written notes in a consistently used memory journal, visual and nonverbal auditory cues such as a calendar on the refrigerator or appointments with alarm, such as on a cell phone, can also help maximize recall.  To address problems with fluctuating attention and/or executive dysfunction, he may wish to consider:   -Avoiding external distractions when needing to concentrate   -Limiting exposure to fast paced environments with multiple sensory demands   -Writing down complicated information and using checklists   -Attempting and completing one task at a time (i.e., no multi-tasking)   -Verbalizing aloud each step of a task to maintain focus   -Taking frequent breaks during the completion of steps/tasks to avoid fatigue   -Reducing the amount of information considered at one time   -Scheduling more difficult activities for a time of day where he is usually most alert  Review of Records:   Mr. Blann completed a comprehensive neuropsychological evaluation with myself on 02/03/2023. Results suggested severe impairment surrounding all aspects of learning and memory. Additional impairments were exhibited across executive functioning and semantic fluency. Performance variability was further exhibited across basic attention, confrontation naming, and visuospatial abilities. No ADL dysfunction was reported. This was confirmed by his significant other at the time. He was ultimately diagnosed with a mild neurocognitive disorder,  likely towards the severe end of that spectrum. Concerns for underlying Alzheimer's disease were expressed. Repeat testing was recommended.   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   History of colonic polyps 11/04/2020   Hypercholesterolemia    Hypertension associated with diabetes 11/17/2014   Hypotension due to hypovolemia 03/28/2024   Kidney stones    Mild neurocognitive disorder due to Alzheimer's disease 02/03/2023   Post-inflammatory hyperpigmentation 03/30/2024   Postural dizziness with presyncope 03/28/2024   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   Type II diabetes mellitus    Urinary tract infection 11/17/2014   Weight loss 04/28/2022    Past Surgical History:  Procedure Laterality Date   CATARACT EXTRACTION     EYE SURGERY     PROSTATE BIOPSY     ROTATOR CUFF REPAIR     Right    Current Outpatient Medications:    amLODipine  (NORVASC ) 10 MG tablet, Take 1 tablet (10 mg total) by mouth daily., Disp: 90 tablet, Rfl: 1   Ascorbic Acid (VITAMIN C) 500 MG CAPS, Take 1 tablet by mouth at bedtime., Disp: , Rfl:    aspirin  81 MG EC tablet, Take 81 mg by mouth at bedtime., Disp: , Rfl:    atorvastatin  (LIPITOR) 20 MG tablet, TAKE 1 TABLET BY MOUTH EVERY DAY, Disp: 90 tablet, Rfl: 1   carboxymethylcellulose (REFRESH PLUS) 0.5 % SOLN, Place 1 drop into both eyes daily., Disp: , Rfl:    cetirizine  (ZYRTEC ) 10 MG tablet, Take 10 mg by mouth at bedtime., Disp: , Rfl:    donepezil  (ARICEPT ) 10 MG tablet, Take 1 tablet (10 mg total) by mouth daily., Disp: 30 tablet, Rfl: 11   escitalopram  (LEXAPRO ) 5 MG tablet, TAKE 1 TABLET (5  MG TOTAL) BY MOUTH DAILY., Disp: 90 tablet, Rfl: 2   glimepiride  (AMARYL ) 2 MG tablet, Take 1 tablet (2 mg total) by mouth 2 (two) times daily., Disp: 180 tablet, Rfl: 1   latanoprost  (XALATAN ) 0.005 % ophthalmic solution, Place 1 drop into both eyes at bedtime., Disp: , Rfl:    memantine  (NAMENDA ) 10 MG tablet, TAKE 1 TABLET BY MOUTH TWICE A DAY, Disp: 180 tablet, Rfl: 0   sulfamethoxazole -trimethoprim  (BACTRIM  DS) 800-160 MG tablet, Take 1 tablet by mouth 2 (two) times daily., Disp: 10 tablet, Rfl: 0   tamsulosin  (FLOMAX ) 0.4 MG CAPS capsule, TAKE 1 CAPSULE BY MOUTH EVERY DAY (Patient taking differently: Take 0.4 mg by mouth at bedtime.), Disp: 90 capsule, Rfl: 3   valsartan  (DIOVAN ) 160 MG tablet, TAKE 1 TABLET BY MOUTH EVERY DAY, Disp: 90 tablet, Rfl: 3      09/28/2023    3:00 PM 03/29/2023    3:00 PM 08/24/2022    2:00 PM 06/17/2016    1:33 PM  MMSE - Mini Mental State Exam  Orientation to time 4 5 3 5    Orientation to Place 4 4 5 5    Registration 3 3 3 3    Attention/ Calculation 5 5 5 5    Recall 0 0 0 2   Language- name 2 objects 2 2 2 2    Language- repeat 1 1 1 1   Language- follow 3 step command 3 3 3 3    Language- read & follow direction 1 1 1 1    Write a sentence 1 1 1 1    Copy design 0 1 0 1   Total score 24 26 24 29        05/17/2022    3:00 PM  Montreal Cognitive Assessment   Attention: Read list of digits (0/2) 2  Attention: Read list of letters (0/1) 1  Attention: Serial 7 subtraction starting at 100 (  0/3) 3  Language: Repeat phrase (0/2) 0  Language : Fluency (0/1) 0  Abstraction (0/2) 0  Delayed Recall (0/5) 0  Orientation (0/6) 6   Neuroimaging: Brain MRI on 06/01/2022 revealed mild generalized age-related volume loss and moderate microvascular ischemic disease.   Clinical Interview:   The following information was obtained during a clinical interview with Mr. Muckleroy and his significant other prior to cognitive testing.  Cognitive Symptoms: Decreased  short-term memory: Endorsed. Previously, he described his memory as not as good as it used to be and, with direct questioning, acknowledged trouble recalling details of recent conversations and being more repetitive in conversation. His significant other previously described Mr. Noreen being very repetitive in conversation and seemed to rapidly forget new information presented to him. Medical records in July 2023 suggested that memory decline had been present for the past two years at that time. Currently, both Mr. Kluever and his significant other reported concern for progressive worsening of memory abilities relative to his previous April 2024 evaluation. Decreased long-term memory: Denied. Decreased attention/concentration: Denied. Reduced processing speed: Denied.  Difficulties with executive functions: Denied. He also denied trouble with impulsivity or any known personality changes. His significant other previously noted that he had seemed more easily angered or more quickly frustrated lately. This was stable. Difficulties with emotion regulation: Denied. Difficulties with receptive language: Denied. Difficulties with word finding: Denied. Decreased visuoperceptual ability: Denied.  Difficulties completing ADLs: Somewhat. Family has set up medications being provided in pre-packaged pills to prevent organization difficulties. Per he and his significant other, he is able to take these on time without assistance. They denied trouble with financial management and the majority of bills are on auto-draft. He has cut back on driving, limiting himself to local and familiar locations. However, they both denied safety-related driving concerns.   Additional Medical History: History of traumatic brain injury/concussion: Denied. History of stroke: Denied. History of seizure activity: Denied. History of known exposure to toxins: Denied. Symptoms of chronic pain: Denied. Experience of frequent  headaches/migraines: Denied. Frequent instances of dizziness/vertigo: Denied.   Sensory changes: He wears reading glasses with benefit. His significant other previously reported ongoing hearing loss but not to the extent that Mr. Renault has been prescribed hearing aids. This was stable. Other sensory changes/difficulties (e.g., taste or smell) were denied.  Balance/coordination difficulties: Denied. He also denied any recent falls.  Other motor difficulties: Denied.  Sleep History: Estimated hours obtained each night: 8 hours.  Difficulties falling asleep: Denied. Difficulties staying asleep: Denied. Feels rested and refreshed upon awakening: Endorsed.   History of snoring: Infrequently. History of waking up gasping for air: Denied. Witnessed breath cessation while asleep: Denied.   History of vivid dreaming: Denied. Excessive movement while asleep: Denied. Instances of acting out his dreams: Denied.  Psychiatric/Behavioral Health History: Depression: He described his current mood as been okay and denied to his knowledge any mental health concerns or formal diagnoses. His significant other did note some recent psychosocial stressors which can impact his mood from time to time. Current or remote suicidal ideation, intent, or plan was denied.  Anxiety: Denied. Mania: Denied. Trauma History: Denied. Visual/auditory hallucinations: Denied. Delusional thoughts: Denied.   Tobacco: Denied. Alcohol: He denied current alcohol consumption as well as a history of problematic alcohol abuse or dependence.  Recreational drugs: Denied.  Family History: Problem Relation Age of Onset   Dementia Cousin        maternal; x2   This information was confirmed by Mr. Nall.  Academic/Vocational History: Highest level of educational attainment: 12 years. He graduated from high school and described himself as an average (A/B/C) student in academic settings. No relative weaknesses were  identified.  History of developmental delay: Denied. History of grade repetition: Denied. Enrollment in special education courses: Denied. History of LD/ADHD: Denied.   Employment: Retired. 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  Evaluation Results:   Behavioral Observations: Mr. Que was accompanied by his significant other, arrived to his appointment on time, and was appropriately dressed and groomed. He appeared alert. Observed gait and station were within normal limits. Gross motor functioning appeared intact upon informal observation and no abnormal movements (e.g., tremors) were noted. His affect was generally relaxed and positive. Spontaneous speech was fluent and word finding difficulties were not observed during the clinical interview. Thought processes were coherent, organized, and normal in content. Insight into his cognitive difficulties appeared limited and I do have concern that he does not fully appreciate the extent of memory impairment or the presence of cognitive dysfunction across other domains.   During testing, Mr. Franks was noted to be extremely repetitive and appeared unaware that he was making the same statements minutes afterwards. Word finding difficulties and comprehension difficulties were also more prevalent throughout testing procedures. Sustained attention was appropriate. Task engagement was adequate and he persisted when challenged. Overall, Mr. Sebo was cooperative with the clinical interview and subsequent testing procedures.   Adequacy of Effort: The validity of neuropsychological testing is limited by the extent to which the individual being tested may be assumed to have exerted adequate effort during testing. Mr. Eggebrecht expressed his intention to perform to the best of his abilities and exhibited adequate task engagement and persistence. Scores across stand-alone and embedded  performance validity measures were variable but largely within expectation. His sole below expectation performance is believed to be due to true and severe memory impairment rather than poor engagement. As such, the results of the current evaluation are believed to be a valid representation of Mr. Tennyson current cognitive functioning.  Test Results: Mr. Cerullo was very disoriented at the time of the current evaluation. He incorrectly stated his phone number. He also incorrectly stated the current month (May) and was unable to state the current date, day of the week, or time.  Intellectual abilities based upon educational and vocational attainment were estimated to be in the average range. Premorbid abilities were estimated to be within the below average range based upon a single-word reading test.   Processing speed was variable, ranging from the exceptionally low to average normative ranges. Basic attention was average to above average. More complex attention (e.g., working memory) was well below average to below average. Executive functioning was exceptionally low to well below average.  Assessed receptive language abilities were well below average. Primary difficulties surrounded him sequencing and following multi-step commands, likely influenced by memory loss. Mr. Parkison did not exhibit any difficulties answering questions asked of him appropriately. Assessed expressive language was variable. Phonemic fluency was below average, semantic fluency was exceptionally low to below average, and confrontation naming was above average across a screening task but below average across a more comprehensive task.   Assessed visuospatial/visuoconstructional abilities were variable, ranging from the well below average to average normative ranges.    Learning (i.e., encoding) of novel verbal information was exceptionally low. Spontaneous delayed recall (i.e., retrieval) of previously learned information  was also exceptionally low. Retention rates  were 0% across a list learning task, 0% across a story learning task, and 0% across a figure drawing task. Performance across recognition tasks was exceptionally low, suggesting negligible evidence for information consolidation.   Results of emotional screening instruments suggested that recent symptoms of generalized anxiety were in the minimal range, while symptoms of depression were within normal limits. A screening instrument assessing recent sleep quality suggested the presence of minimal sleep dysfunction.  Table of Scores:   Note: This summary of test scores accompanies the interpretive report and should not be considered in isolation without reference to the appropriate sections in the text. Descriptors are based on appropriate normative data and may be adjusted based on clinical judgment. Terms such as Within Normal Limits and Outside Normal Limits are used when a more specific description of the test score cannot be determined. Descriptors refer to the current evaluation only.         Percentile - Normative Descriptor > 98 - Exceptionally High 91-97 - Well Above Average 75-90 - Above Average 25-74 - Average 9-24 - Below Average 2-8 - Well Below Average < 2 - Exceptionally Low        Validity: April 2024 Current  DESCRIPTOR        DCT: --- --- --- Within Normal Limits  RBANS EI: --- --- --- Outside Normal Limits  WAIS-IV RDS: --- --- --- Within Normal Limits        Orientation:       Raw Score Raw Score Percentile   NAB Orientation, Form 1 29/29 19/29 --- ---        Cognitive Screening:       Raw Score Raw Score Percentile   SLUMS: 16/30 9/30 --- ---        RBANS, Form A: Standard Score/ Scaled Score Standard Score/ Scaled Score Percentile   Total Score 59 58 <1 Exceptionally Low  Immediate Memory 61 53 <1 Exceptionally Low    List Learning 4 2 <1 Exceptionally Low    Story Memory 3 3 1  Exceptionally Low   Visuospatial/Constructional 72 72 3 Well Below Average    Figure Copy 4 4 2  Well Below Average    Line Orientation 13/20 13/20 10-16 Below Average  Language 89 86 18 Below Average    Picture Naming 10/10 10/10 >75 Above Average    Semantic Fluency 5 4 2  Well Below Average  Attention 64 85 16 Below Average    Digit Span 5 13 84 Above Average    Coding 4 2 <1 Exceptionally Low  Delayed Memory 52 40 <1 Exceptionally Low    List Recall 0/10 0/10 <2 Exceptionally Low    List Recognition 15/20 9/20 <2 Exceptionally Low    Story Recall 1 1 <1 Exceptionally Low    Story Recognition 6/12 0/12 <1 Exceptionally Low    Figure Recall 1 1 <1 Exceptionally Low    Figure Recognition 1/8 0/8 <1 Exceptionally Low         Intellectual Functioning:       Standard Score Standard Score Percentile   Test of Premorbid Functioning: 80 80 9 Below Average        Attention/Executive Function:      Trail Making Test (TMT): Raw Score (T Score) Raw Score (T Score) Percentile     Part A 68 secs.,  0 errors (45) 75 secs.,  2 errors (40) 16 Below Average    Part B Discontinued Discontinued --- Impaired  Scaled Score Scaled Score Percentile   WAIS-IV Digit Span: 9 6 9  Below Average    Forward 11 11 63 Average    Backward 8 6 9  Below Average    Sequencing 7 5 5  Well Below Average         Scaled Score Scaled Score Percentile   WAIS-IV Similarities: 5 5 5  Well Below Average        D-KEFS Color-Word Interference Test: Raw Score (Scaled Score) Raw Score (Scaled Score) Percentile     Color Naming 37 secs. (10) 51 secs. (4) 2 Well Below Average    Word Reading 24 secs. (11) 26 secs. (10) 50 Average    Inhibition 125 secs. (6) Discontinued --- Impaired      Total Errors 22 errors (1) --- --- ---    Inhibition/Switching 83 secs. (12) Discontinued --- Impaired      Total Errors 17 errors (1) --- --- ---        NAB Executive Functions Module, Form 1: T Score T Score Percentile     Judgment 60 47 38  Average        Language:      Verbal Fluency Test: Raw Score (Scaled Score) Raw Score (Scaled Score) Percentile     Phonemic Fluency (CFL) 28 (9) 22 (7) 16 Below Average    Category Fluency 21 (5) 23 (6) 9 Below Average  *Based on [employer] Older Normative Studies (MOANS)            NAB Language Module, Form 1: T Score T Score Percentile     Auditory Comprehension 56 34 5 Well Below Average    Naming 24/31 (35) 26/31 (40) 16 Below Average        Visuospatial/Visuoconstruction:       Raw Score Raw Score Percentile   Clock Drawing: 9/10 7/10 --- Within Normal Limits         Scaled Score Scaled Score Percentile   WAIS-IV Block Design: 10 10 50 Average        Mood and Personality:       Raw Score Raw Score Percentile   Geriatric Depression Scale: 4 6 --- Within Normal Limits  Geriatric Anxiety Scale: 3 1 --- Minimal    Somatic 1 0 --- Minimal    Cognitive 2 1 --- Minimal    Affective 0 0 --- Minimal        Additional Questionnaires:       Raw Score Raw Score Percentile   PROMIS Sleep Disturbance Questionnaire: 9 9 --- None to Slight   Informed Consent and Coding/Compliance:   The current evaluation represents a clinical evaluation for the purposes previously outlined by the referral source and is in no way reflective of a forensic evaluation.   Mr. Olliff was provided with a verbal description of the nature and purpose of the present neuropsychological evaluation. Also reviewed were the foreseeable risks and/or discomforts and benefits of the procedure, limits of confidentiality, and mandatory reporting requirements of this provider. The patient was given the opportunity to ask questions and receive answers about the evaluation. Oral consent to participate was provided by the patient.   This evaluation was conducted by Melville Stade, Ph.D., ABPP-CN, board certified clinical neuropsychologist. Mr. Carattini completed a clinical interview with Dr. Kitty Perkins, billed as one unit [phone], and  125 minutes of cognitive testing and scoring, billed as one unit [phone] and three additional units [id number]. Psychometrist Arnulfo Larch, B.S. assisted Dr. Kitty Perkins with test administration and scoring  procedures. As a separate and discrete service, one unit [phone] and two units 96133 (160 minutes) were billed for Dr. Arsenio Larger time spent in interpretation and report writing.

## 2024-03-31 ENCOUNTER — Ambulatory Visit: Payer: Self-pay | Admitting: Emergency Medicine

## 2024-04-01 LAB — URINE CULTURE: Culture: 10000 — AB

## 2024-04-02 ENCOUNTER — Other Ambulatory Visit: Payer: Self-pay | Admitting: Physician Assistant

## 2024-04-02 LAB — CULTURE, BLOOD (ROUTINE X 2)
Culture: NO GROWTH
Culture: NO GROWTH

## 2024-04-03 ENCOUNTER — Ambulatory Visit (INDEPENDENT_AMBULATORY_CARE_PROVIDER_SITE_OTHER): Payer: Medicare Other | Admitting: Psychology

## 2024-04-03 DIAGNOSIS — G309 Alzheimer's disease, unspecified: Secondary | ICD-10-CM | POA: Diagnosis not present

## 2024-04-03 DIAGNOSIS — F0280 Dementia in other diseases classified elsewhere without behavioral disturbance: Secondary | ICD-10-CM | POA: Diagnosis not present

## 2024-04-03 DIAGNOSIS — I679 Cerebrovascular disease, unspecified: Secondary | ICD-10-CM

## 2024-04-03 NOTE — Progress Notes (Signed)
   Neuropsychology Feedback Session Tommas Fragmin. [facility] Department of Neurology  Reason for Referral:   CHESKY HEYER is a 83 y.o. 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  Feedback:   Mr. Gough completed a comprehensive neuropsychological evaluation on 03/30/2024. Please refer to that encounter for the full report and recommendations. Briefly, results suggested severe impairment surrounding all aspects of learning and memory. An isolated impairment across receptive language was likely caused by memory impairment. Additional weaknesses were exhibited across complex attention, executive functioning, and semantic fluency. Performance variability was exhibited across processing speed, confrontation naming, and visuospatial abilities. Relative to his previous evaluation in April 2024, decline was exhibited across attention/concentration, receptive language, and aspects of memory, particularly those surrounding recognition/consolidation. Outside of this, other assessed domains exhibited relative stability. Regarding the cause for memory impairment and progressive decline over time, primary concerns continue to surround underlying Alzheimer's disease. Across memory testing, Mr. Matassa did not benefit from repeated exposure to information across learning trials, was fully amnestic (i.e., 0% retention) across all memory tasks after a brief delay, and performed poorly across yes/no recognition trials. 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 Impairments and/or weakness surrounding semantic  fluency and confrontation naming would follow typical disease progression.   Mr. Adami was accompanied by his significant other in person and son via speakerphone. Content of the current session focused on the results of his neuropsychological evaluation. Mr. Auxier was given the opportunity to ask questions and his questions were answered. He was encouraged to reach out should additional questions arise. A copy of his report was provided at the conclusion of the visit.      One unit [id number] (32 minutes) was billed for Dr. Arsenio Larger time spent preparing for, conducting, and documenting the current feedback session with Mr. Penick.

## 2024-04-04 ENCOUNTER — Ambulatory Visit: Admitting: Emergency Medicine

## 2024-04-04 ENCOUNTER — Encounter: Payer: Self-pay | Admitting: Emergency Medicine

## 2024-04-04 VITALS — BP 106/58 | HR 66 | Temp 99.2°F | Ht 66.0 in | Wt 137.0 lb

## 2024-04-04 DIAGNOSIS — E785 Hyperlipidemia, unspecified: Secondary | ICD-10-CM

## 2024-04-04 DIAGNOSIS — E1169 Type 2 diabetes mellitus with other specified complication: Secondary | ICD-10-CM | POA: Diagnosis not present

## 2024-04-04 DIAGNOSIS — Z7984 Long term (current) use of oral hypoglycemic drugs: Secondary | ICD-10-CM | POA: Diagnosis not present

## 2024-04-04 DIAGNOSIS — I152 Hypertension secondary to endocrine disorders: Secondary | ICD-10-CM | POA: Diagnosis not present

## 2024-04-04 DIAGNOSIS — Z09 Encounter for follow-up examination after completed treatment for conditions other than malignant neoplasm: Secondary | ICD-10-CM

## 2024-04-04 DIAGNOSIS — E861 Hypovolemia: Secondary | ICD-10-CM | POA: Diagnosis not present

## 2024-04-04 DIAGNOSIS — N3000 Acute cystitis without hematuria: Secondary | ICD-10-CM | POA: Diagnosis not present

## 2024-04-04 DIAGNOSIS — E1159 Type 2 diabetes mellitus with other circulatory complications: Secondary | ICD-10-CM | POA: Diagnosis not present

## 2024-04-04 LAB — URINALYSIS, ROUTINE W REFLEX MICROSCOPIC
Bilirubin Urine: NEGATIVE
Hgb urine dipstick: NEGATIVE
Ketones, ur: NEGATIVE
Nitrite: NEGATIVE
RBC / HPF: NONE SEEN (ref 0–?)
Specific Gravity, Urine: >=1.03 — AB (ref 1.000–1.030)
Urine Glucose: NEGATIVE
Urobilinogen, UA: 0.2 (ref 0.0–1.0)
pH: 6 (ref 5.0–8.0)

## 2024-04-04 NOTE — Progress Notes (Signed)
 Tony Price 83 y.o.   Chief Complaint  Patient presents with   Hospitalization Follow-up    Patient here for HFU for 03/27/24. Patient gf states when this happened at church and ate breakfast threw up and blacked out.  Patient states he does feel better today. Has not happened again. Patient has requested for parking placard     HISTORY OF PRESENT ILLNESS: This is a 83 y.o. male here for hospital discharge follow-up.  Accompanied by wife. Admitted on 03/27/2024 and discharged 03/28/2024 with diagnosis of UTI Presented initially with syncopal episode Has 1 dose of antibiotic left to take Feels much better today.  Has no complaints or medical concerns.  Physician Discharge Summary    Patient: Tony Price MRN: [id number] DOB: 30-Aug-1941  Admit date:     03/27/2024  Discharge date: 03/28/24  Discharge Physician: Jodeane Mulligan    PCP: Elvira Hammersmith, MD    Recommendations at discharge:     Pt to be discharged home.   If you experience worsening fever, chills, chest pain, shortness of breath, or other concerning symptoms, please call your PCP or go to the emergency department immediately.   Discharge Diagnoses: Active Problems:   Urinary tract infection   Postural dizziness with presyncope   Aortic stenosis   Hypotension due to hypovolemia   Principal Problem (Resolved):   Syncope     Hospital Course:   Tony Price is a 83 y.o. 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 He reports feeling well and just going to the ground after stepping out of the car. Per wife he had been in normal state of health although takes in limited amount of water usually. Today was standing in kitchen and just appeared slightly out of it. Then went out in the evening and when stepping out of the car, stepped out and then had to hold onto the car and went down on  his knees and wife helped him up. Did not lose consciousness or sustain any injuries. He denies any associated light-headedness or dizziness, chest pain, palipitations. No recent illness, specifically denies any urinary changes. He was seen at urgent care and was hypotensive 90/50s and sent to ER, then transferred from [facility].    Assessment and Plan:   Repeat assessment performed greater than 8 hours after initial presentation.   Hypovolemic hypotension with near syncope - Likely secondary to decreased p.o. intake.  Orthostatic on presentation.  Resolved after IV fluid hydration.  Patient feeling well, ambulatory, eager for discharge home.  Family at bedside eager as well.  No abnormalities observed on telemetry.  Encourage continued oral hydration.  Can restart antihypertensives zotepine, valsartan  tomorrow 6/12.   Acute cystitis - UA showing nitrites, LE, WBCs, bacteria.  Likely etiology of patient's symptoms above.  Initiated on empiric ceftriaxone .  Will transition patient to p.o. Bactrim  to take as directed upon discharge.    HPI   Prior to Admission medications   Medication Sig Start Date End Date Taking? Authorizing Provider  amLODipine  (NORVASC ) 10 MG tablet Take 1 tablet (10 mg total) by mouth daily. 03/15/24  Yes Dachelle Molzahn, Isidro Margo, MD  Ascorbic Acid (VITAMIN C) 500 MG CAPS Take 1 tablet by mouth at bedtime.   Yes [provider]  aspirin  81 MG EC tablet Take 81 mg by mouth at bedtime.   Yes [provider]  atorvastatin  (LIPITOR) 20 MG tablet TAKE 1 TABLET BY MOUTH EVERY DAY 12/20/23  Yes Sharda Keddy, Isidro Margo, MD  carboxymethylcellulose (REFRESH PLUS) 0.5 % SOLN Place 1 drop into both eyes daily.   Yes [provider]  cetirizine  (ZYRTEC ) 10 MG tablet Take 10 mg by mouth at bedtime.   Yes [provider]  donepezil  (ARICEPT ) 10 MG tablet TAKE 1 TABLET BY MOUTH EVERY DAY 04/02/24  Yes Wertman, Sara E, PA-C  escitalopram  (LEXAPRO ) 5 MG tablet TAKE 1  TABLET (5 MG TOTAL) BY MOUTH DAILY. 10/13/23  Yes Wertman, Sara E, PA-C  glimepiride  (AMARYL ) 2 MG tablet Take 1 tablet (2 mg total) by mouth 2 (two) times daily. 03/13/24  Yes Breiana Stratmann, Isidro Margo, MD  latanoprost  (XALATAN ) 0.005 % ophthalmic solution Place 1 drop into both eyes at bedtime. 08/31/19  Yes [provider]  memantine  (NAMENDA ) 10 MG tablet TAKE 1 TABLET BY MOUTH TWICE A DAY 03/13/24  Yes Wertman, Sara E, PA-C  sulfamethoxazole -trimethoprim  (BACTRIM  DS) 800-160 MG tablet Take 1 tablet by mouth 2 (two) times daily. 03/28/24  Yes Jodeane Mulligan, DO  tamsulosin  (FLOMAX ) 0.4 MG CAPS capsule TAKE 1 CAPSULE BY MOUTH EVERY DAY Patient taking differently: Take 0.4 mg by mouth at bedtime. 12/30/23  Yes Choua Ikner, Isidro Margo, MD  valsartan  (DIOVAN ) 160 MG tablet TAKE 1 TABLET BY MOUTH EVERY DAY 12/21/23  Yes Elvira Hammersmith, MD    No Known Allergies  Patient Active Problem List   Diagnosis Date Noted   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   Postural dizziness with presyncope 03/28/2024   Aortic stenosis 03/28/2024   Hypotension due to hypovolemia 03/28/2024   Hypercholesterolemia 02/03/2023   Type II diabetes mellitus    Weight loss 04/28/2022   Erectile dysfunction due to arterial insufficiency 09/14/2021   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   History of colonic polyps 11/04/2020   Diabetic retinopathy 03/31/2016   Dyslipidemia associated with type 2 diabetes mellitus 11/17/2014   Hypertension associated with diabetes 11/17/2014    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   Chest pain    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   Diabetic retinopathy 03/31/2016   Both eyes mild background diabetic retinopathy     Dyslipidemia associated with type 2 diabetes mellitus 11/17/2014    Erectile dysfunction due to arterial insufficiency 09/14/2021   History of colonic polyps 11/04/2020   Hypercholesterolemia    Hypertension associated with diabetes 11/17/2014   Hypotension due to hypovolemia 03/28/2024   Kidney stones    Post-inflammatory hyperpigmentation 03/30/2024   Postural dizziness with presyncope 03/28/2024   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   Type II diabetes mellitus    Urinary tract infection 11/17/2014   Weight loss 04/28/2022    Past Surgical History:  Procedure Laterality Date   CATARACT EXTRACTION     EYE SURGERY     PROSTATE BIOPSY     ROTATOR CUFF REPAIR     Right    Social History   Socioeconomic History   Marital status: Divorced    Spouse name: Not on file   Number of children: 3   Years of education: 12   Highest education level: High school graduate  Occupational History   Occupation: Retired  Tobacco Use   Smoking status: Former    Types: Cigarettes   Smokeless tobacco: Never  Substance  and Sexual Activity   Alcohol use: No    Alcohol/week: 0.0 standard drinks of alcohol   Drug use: No   Sexual activity: Not Currently  Other Topics Concern   Not on file  Social History Narrative   Right handed   No caffeine   No falls   One story home   Social Drivers of Health   Financial Resource Strain: Low Risk  (07/13/2023)   Overall Financial Resource Strain (CARDIA)    Difficulty of Paying Living Expenses: Not hard at all  Food Insecurity: No Food Insecurity (03/28/2024)   Hunger Vital Sign    Worried About Running Out of Food in the Last Year: Never true    Ran Out of Food in the Last Year: Never true  Transportation Needs: No Transportation Needs (03/28/2024)   PRAPARE - Administrator, Civil Service (Medical): No    Lack of Transportation (Non-Medical): No  Physical Activity: Sufficiently Active (07/13/2023)   Exercise Vital Sign    Days of Exercise per Week: 3 days    Minutes of Exercise per Session: 50 min   Stress: No Stress Concern Present (07/13/2023)   [employer] of Occupational Health - Occupational Stress Questionnaire    Feeling of Stress : Not at all  Social Connections: Unknown (07/13/2023)   Social Connection and Isolation Panel    Frequency of Communication with Friends and Family: More than three times a week    Frequency of Social Gatherings with Friends and Family: More than three times a week    Attends Religious Services: Patient unable to answer    Active Member of Clubs or Organizations: No    Attends Banker Meetings: Not on file    Marital Status: Divorced  Intimate Partner Violence: Not At Risk (03/28/2024)   Humiliation, Afraid, Rape, and Kick questionnaire    Fear of Current or Ex-Partner: No    Emotionally Abused: No    Physically Abused: No    Sexually Abused: No    Family History  Problem Relation Age of Onset   Dementia Cousin        maternal; x2     Review of Systems  Constitutional: Negative.  Negative for chills and fever.  HENT: Negative.  Negative for congestion and sore throat.   Respiratory: Negative.  Negative for cough and shortness of breath.   Cardiovascular: Negative.  Negative for chest pain and palpitations.  Gastrointestinal:  Negative for abdominal pain, diarrhea, nausea and vomiting.  Genitourinary: Negative.  Negative for dysuria, flank pain, frequency and hematuria.  Skin: Negative.  Negative for rash.  Neurological: Negative.  Negative for dizziness and headaches.  All other systems reviewed and are negative.   Today's Vitals   04/04/24 1350  BP: (!) 106/58  Pulse: 66  Temp: 99.2 F (37.3 C)  TempSrc: Oral  SpO2: 97%  Weight: 137 lb (62.1 kg)  Height: 5' 6 (1.676 m)   Body mass index is 22.11 kg/m.   Physical Exam Vitals reviewed.  Constitutional:      Appearance: Normal appearance.  HENT:     Head: Normocephalic.     Mouth/Throat:     Mouth: Mucous membranes are moist.     Pharynx: Oropharynx  is clear.   Eyes:     Extraocular Movements: Extraocular movements intact.     Pupils: Pupils are equal, round, and reactive to light.    Cardiovascular:     Rate and Rhythm: Normal rate and regular rhythm.  Pulses: Normal pulses.     Heart sounds: Normal heart sounds.  Pulmonary:     Effort: Pulmonary effort is normal.     Breath sounds: Normal breath sounds.  Abdominal:     Palpations: Abdomen is soft.     Tenderness: There is no abdominal tenderness.   Musculoskeletal:     Cervical back: No tenderness.  Lymphadenopathy:     Cervical: No cervical adenopathy.   Skin:    General: Skin is warm and dry.     Capillary Refill: Capillary refill takes less than 2 seconds.   Neurological:     General: No focal deficit present.     Mental Status: He is alert and oriented to person, place, and time.   Psychiatric:        Mood and Affect: Mood normal.        Behavior: Behavior normal.    Results for orders placed or performed in visit on 04/04/24 (from the past 24 hours)  Urinalysis, Routine w reflex microscopic     Status: Abnormal   Collection Time: 04/04/24  2:16 PM  Result Value Ref Range   Color, Urine YELLOW Yellow;Lt. Yellow;Straw;Dark Yellow;Amber;Green;Red;Brown   APPearance Sl Cloudy (A) Clear;Turbid;Slightly Cloudy;Cloudy   Specific Gravity, Urine >=1.030 (A) 1.000 - 1.030   pH 6.0 5.0 - 8.0   Total Protein, Urine TRACE (A) Negative   Urine Glucose NEGATIVE Negative   Ketones, ur NEGATIVE Negative   Bilirubin Urine NEGATIVE Negative   Hgb urine dipstick NEGATIVE Negative   Urobilinogen, UA 0.2 0.0 - 1.0   Leukocytes,Ua TRACE (A) Negative   Nitrite NEGATIVE Negative   WBC, UA 0-2/hpf 0-2/hpf   RBC / HPF none seen 0-2/hpf   Mucus, UA Presence of (A) None   Squamous Epithelial / HPF Rare(0-4/hpf) Rare(0-4/hpf)   Uric Acid Crys, UA Presence of (A) None     ASSESSMENT & PLAN: A total of 45 minutes was spent with the patient and counseling/coordination of  care regarding preparing for this visit, review of most recent office visit notes, review of most recent hospital discharge summary, review of multiple chronic medical conditions and their management, review of all medications, review of most recent bloodwork results, review of health maintenance items, education on nutrition, prognosis, documentation, and need for follow up.   Problem List Items Addressed This Visit       Cardiovascular and Mediastinum   Hypertension associated with diabetes   Well-controlled diabetes and hypertension Hemoglobin A1c 6.2 Cardiovascular risks associated with both conditions discussed Diet and nutrition discussed.  Encouraged to increase daily caloric intake Continue glimepiride  2 mg twice a day, amlodipine  10 mg daily and valsartan  160 mg daily Recommend follow-up in 6 months      Hypotension due to hypovolemia   BP Readings from Last 3 Encounters:  04/04/24 (!) 106/58  03/28/24 117/63  03/27/24 (!) 96/54  Much improved. Advised to stay well-hydrated         Endocrine   Dyslipidemia associated with type 2 diabetes mellitus   Chronic stable conditions Continue daily glimepiride  2 mg twice a day and atorvastatin  20 mg daily Diet and nutrition discussed        Genitourinary   Acute cystitis without hematuria - Primary   Clinically stable and asymptomatic Recommend repeat urinalysis and urine culture Continue and finish antibiotic as prescribed Advised to rest and stay well-hydrated Blood cultures were negative.  No signs of sepsis      Relevant Orders   Urinalysis  Urine Culture   Other Visit Diagnoses       Hospital discharge follow-up          Patient Instructions  Health Maintenance After Age 43 After age 21, you are at a higher risk for certain long-term diseases and infections as well as injuries from falls. Falls are a major cause of broken bones and head injuries in people who are older than age 70. Getting regular  preventive care can help to keep you healthy and well. Preventive care includes getting regular testing and making lifestyle changes as recommended by your health care provider. Talk with your health care provider about: Which screenings and tests you should have. A screening is a test that checks for a disease when you have no symptoms. A diet and exercise plan that is right for you. What should I know about screenings and tests to prevent falls? Screening and testing are the best ways to find a health problem early. Early diagnosis and treatment give you the best chance of managing medical conditions that are common after age 13. Certain conditions and lifestyle choices may make you more likely to have a fall. Your health care provider may recommend: Regular vision checks. Poor vision and conditions such as cataracts can make you more likely to have a fall. If you wear glasses, make sure to get your prescription updated if your vision changes. Medicine review. Work with your health care provider to regularly review all of the medicines you are taking, including over-the-counter medicines. Ask your health care provider about any side effects that may make you more likely to have a fall. Tell your health care provider if any medicines that you take make you feel dizzy or sleepy. Strength and balance checks. Your health care provider may recommend certain tests to check your strength and balance while standing, walking, or changing positions. Foot health exam. Foot pain and numbness, as well as not wearing proper footwear, can make you more likely to have a fall. Screenings, including: Osteoporosis screening. Osteoporosis is a condition that causes the bones to get weaker and break more easily. Blood pressure screening. Blood pressure changes and medicines to control blood pressure can make you feel dizzy. Depression screening. You may be more likely to have a fall if you have a fear of falling, feel  depressed, or feel unable to do activities that you used to do. Alcohol use screening. Using too much alcohol can affect your balance and may make you more likely to have a fall. Follow these instructions at home: Lifestyle Do not drink alcohol if: Your health care provider tells you not to drink. If you drink alcohol: Limit how much you have to: 0-1 drink a day for women. 0-2 drinks a day for men. Know how much alcohol is in your drink. In the [place] one drink equals one 12 oz bottle of beer (355 mL), one 5 oz glass of wine (148 mL), or one 1 oz glass of hard liquor (44 mL). Do not use any products that contain nicotine or tobacco. These products include cigarettes, chewing tobacco, and vaping devices, such as e-cigarettes. If you need help quitting, ask your health care provider. Activity  Follow a regular exercise program to stay fit. This will help you maintain your balance. Ask your health care provider what types of exercise are appropriate for you. If you need a cane or walker, use it as recommended by your health care provider. Wear supportive shoes that have nonskid soles. Safety  Remove any tripping hazards, such as rugs, cords, and clutter. Install safety equipment such as grab bars in bathrooms and safety rails on stairs. Keep rooms and walkways well-lit. General instructions Talk with your health care provider about your risks for falling. Tell your health care provider if: You fall. Be sure to tell your health care provider about all falls, even ones that seem minor. You feel dizzy, tiredness (fatigue), or off-balance. Take over-the-counter and prescription medicines only as told by your health care provider. These include supplements. Eat a healthy diet and maintain a healthy weight. A healthy diet includes low-fat dairy products, low-fat (lean) meats, and fiber from whole grains, beans, and lots of fruits and vegetables. Stay current with your vaccines. Schedule regular  health, dental, and eye exams. Summary Having a healthy lifestyle and getting preventive care can help to protect your health and wellness after age 72. Screening and testing are the best way to find a health problem early and help you avoid having a fall. Early diagnosis and treatment give you the best chance for managing medical conditions that are more common for people who are older than age 57. Falls are a major cause of broken bones and head injuries in people who are older than age 26. Take precautions to prevent a fall at home. Work with your health care provider to learn what changes you can make to improve your health and wellness and to prevent falls. This information is not intended to replace advice given to you by your health care provider. Make sure you discuss any questions you have with your health care provider. Document Revised: 02/23/2021 Document Reviewed: 02/23/2021 Elsevier Patient Education  2024 Elsevier Inc.    Maryagnes Small, MD  Primary Care at [facility]

## 2024-04-04 NOTE — Assessment & Plan Note (Signed)
 Clinically stable and asymptomatic Recommend repeat urinalysis and urine culture Continue and finish antibiotic as prescribed Advised to rest and stay well-hydrated Blood cultures were negative.  No signs of sepsis

## 2024-04-04 NOTE — Assessment & Plan Note (Signed)
 Well-controlled diabetes and hypertension Hemoglobin A1c 6.2 Cardiovascular risks associated with both conditions discussed Diet and nutrition discussed.  Encouraged to increase daily caloric intake Continue glimepiride  2 mg twice a day, amlodipine  10 mg daily and valsartan  160 mg daily Recommend follow-up in 6 months

## 2024-04-04 NOTE — Patient Instructions (Signed)
 Health Maintenance After Age 83 After age 4, you are at a higher risk for certain long-term diseases and infections as well as injuries from falls. Falls are a major cause of broken bones and head injuries in people who are older than age 47. Getting regular preventive care can help to keep you healthy and well. Preventive care includes getting regular testing and making lifestyle changes as recommended by your health care provider. Talk with your health care provider about: Which screenings and tests you should have. A screening is a test that checks for a disease when you have no symptoms. A diet and exercise plan that is right for you. What should I know about screenings and tests to prevent falls? Screening and testing are the best ways to find a health problem early. Early diagnosis and treatment give you the best chance of managing medical conditions that are common after age 37. Certain conditions and lifestyle choices may make you more likely to have a fall. Your health care provider may recommend: Regular vision checks. Poor vision and conditions such as cataracts can make you more likely to have a fall. If you wear glasses, make sure to get your prescription updated if your vision changes. Medicine review. Work with your health care provider to regularly review all of the medicines you are taking, including over-the-counter medicines. Ask your health care provider about any side effects that may make you more likely to have a fall. Tell your health care provider if any medicines that you take make you feel dizzy or sleepy. Strength and balance checks. Your health care provider may recommend certain tests to check your strength and balance while standing, walking, or changing positions. Foot health exam. Foot pain and numbness, as well as not wearing proper footwear, can make you more likely to have a fall. Screenings, including: Osteoporosis screening. Osteoporosis is a condition that causes  the bones to get weaker and break more easily. Blood pressure screening. Blood pressure changes and medicines to control blood pressure can make you feel dizzy. Depression screening. You may be more likely to have a fall if you have a fear of falling, feel depressed, or feel unable to do activities that you used to do. Alcohol use screening. Using too much alcohol can affect your balance and may make you more likely to have a fall. Follow these instructions at home: Lifestyle Do not drink alcohol if: Your health care provider tells you not to drink. If you drink alcohol: Limit how much you have to: 0-1 drink a day for women. 0-2 drinks a day for men. Know how much alcohol is in your drink. In the [place] one drink equals one 12 oz bottle of beer (355 mL), one 5 oz glass of wine (148 mL), or one 1 oz glass of hard liquor (44 mL). Do not use any products that contain nicotine or tobacco. These products include cigarettes, chewing tobacco, and vaping devices, such as e-cigarettes. If you need help quitting, ask your health care provider. Activity  Follow a regular exercise program to stay fit. This will help you maintain your balance. Ask your health care provider what types of exercise are appropriate for you. If you need a cane or walker, use it as recommended by your health care provider. Wear supportive shoes that have nonskid soles. Safety  Remove any tripping hazards, such as rugs, cords, and clutter. Install safety equipment such as grab bars in bathrooms and safety rails on stairs. Keep rooms and walkways  well-lit. General instructions Talk with your health care provider about your risks for falling. Tell your health care provider if: You fall. Be sure to tell your health care provider about all falls, even ones that seem minor. You feel dizzy, tiredness (fatigue), or off-balance. Take over-the-counter and prescription medicines only as told by your health care provider. These include  supplements. Eat a healthy diet and maintain a healthy weight. A healthy diet includes low-fat dairy products, low-fat (lean) meats, and fiber from whole grains, beans, and lots of fruits and vegetables. Stay current with your vaccines. Schedule regular health, dental, and eye exams. Summary Having a healthy lifestyle and getting preventive care can help to protect your health and wellness after age 11. Screening and testing are the best way to find a health problem early and help you avoid having a fall. Early diagnosis and treatment give you the best chance for managing medical conditions that are more common for people who are older than age 28. Falls are a major cause of broken bones and head injuries in people who are older than age 48. Take precautions to prevent a fall at home. Work with your health care provider to learn what changes you can make to improve your health and wellness and to prevent falls. This information is not intended to replace advice given to you by your health care provider. Make sure you discuss any questions you have with your health care provider. Document Revised: 02/23/2021 Document Reviewed: 02/23/2021 Elsevier Patient Education  2024 [employer].

## 2024-04-04 NOTE — Assessment & Plan Note (Signed)
Chronic stable conditions Continue daily glimepiride 2 mg twice a day and atorvastatin 20 mg daily Diet and nutrition discussed

## 2024-04-04 NOTE — Assessment & Plan Note (Signed)
 BP Readings from Last 3 Encounters:  04/04/24 (!) 106/58  03/28/24 117/63  03/27/24 (!) 96/54  Much improved. Advised to stay well-hydrated

## 2024-04-05 ENCOUNTER — Ambulatory Visit (INDEPENDENT_AMBULATORY_CARE_PROVIDER_SITE_OTHER): Admitting: Physician Assistant

## 2024-04-05 ENCOUNTER — Encounter: Payer: Self-pay | Admitting: Physician Assistant

## 2024-04-05 VITALS — BP 90/50 | HR 71 | Resp 18 | Ht 66.0 in | Wt 138.0 lb

## 2024-04-05 DIAGNOSIS — F0280 Dementia in other diseases classified elsewhere without behavioral disturbance: Secondary | ICD-10-CM

## 2024-04-05 DIAGNOSIS — G309 Alzheimer's disease, unspecified: Secondary | ICD-10-CM

## 2024-04-05 LAB — URINE CULTURE: Result:: NO GROWTH

## 2024-04-05 NOTE — Progress Notes (Signed)
 Assessment/Plan:    Dementia likely due to Alzheimer's disease and vascular etiology  Tony Price is a very pleasant 83 y.o. RH male with a history of hypertension, hyperlipidemia, DM2, diabetic retinopathy, recent hospitalization for hypotension due to dehydration and UTI, and a diagnosis of dementia due to Alzheimer's disease per neuropsych evaluation on June 2025 presenting today in follow-up for evaluation of memory loss. Patient is on memantine  10 mg twice daily and donepezil  10 mg daily.  He is able to participate in his ADLs , he drives minimally. Mood is good.   Recommendations:   Follow up in 6  months. Continue donepezil  10 mg daily and memantine  10 mg twice daily, side effects discussed Recommend checking hearing with audiology Monitor low BP, increase water intake. Recommend good control of cardiovascular risk factors Continue to control mood as per PCP Monitor driving Recommend attending adult day program for social and cognitive stimulation    Subjective:   This patient is accompanied in the office by his son  and his wife who supplements the history. Previous records as well as any outside records available were reviewed prior to todays visit.   Patient was last seen on 09/28/2023 with MMSE 24/30 .    Any changes in memory since last visit? Worse than before, especially over the last couple of months.  He has more difficulty with recent conversations, as well as names.  He likes going to [place], watching TV, does not like doing any brain stimulating exercises or social activities.. repeats oneself?  Endorsed, frequently. Disoriented when walking into a room?  Patient denies.    Misplacing objects?  Patient denies   Wandering behavior?   Denies. Any personality changes since last visit? Denies.   Any worsening depression?: Denies  Hallucinations or paranoia?  Denies.   Seizures?   Denies.    Any sleep changes? Sleeps well  Denies vivid dreams, he does talk  in his sleep. Denies sleepwalking Sleep apnea?   denies    Any hygiene concerns?   Denies.   Independent of bathing and dressing?  Endorsed  Does the patient needs help with medications? Son  is in charge   Who is in charge of the finances?Son is in charge     Any changes in appetite?  denies.  He eats well. Does not drink enough water.    Patient have trouble swallowing?  Denies.   Does the patient cook?  Yes , breakfast.  Any kitchen accidents such as leaving the stove on?   Denies.   Any headaches?    Denies.   Vision changes? Denies. Chronic pain?  Denies.   Ambulates with difficulty?  Denies, but slowed down a little bit.    Recent falls or head injuries?    Denies.      Unilateral weakness, numbness or tingling?  Denies.   Any tremors?  Denies.   Any anosmia?    Denies.   Any incontinence of urine?  He had recent acute cystitis requiring visit to the ED.Aaron Aas Any bowel dysfunction?  Denies.      Patient lives with his wife  Does the patient drive?  Wife drives most of the time.     Initial visit 05/17/22 How long did patient have memory difficulties?  About 2 years, short-term memory is worse over the last 2 months.  If I telling him going to do something, he will ask me again  Patient lives with: His daughter and granddaughter, according to the wife,  they bring significant amount of stress, which wife feels that exacerbate his memory difficulties.   Repeats oneself?  Endorsed, he asked the same questions and tells the same stories. Disoriented when walking into a room?  Patient denies   Leaving objects in unusual places?  Patient denies   Ambulates  with difficulty?   Patient denies   Recent falls?  Patient denies   Any head injuries?  Patient denies   History of seizures?   Patient denies   Wandering behavior?  Patient denies   Patient drives?  No issues, may need GPS even when it was a familiar location  Any mood changes? More frustrated than before  Any history of  depression?:  Patient denies   Hallucinations?  Patient denies   Paranoia?  Patient denies   Patient reports that he sleeps well without vivid dreams, REM behavior or sleepwalking   History of sleep apnea?  Patient denies   Any hygiene concerns?  Patient denies   Independent of bathing and dressing?  Endorsed  Does the patient needs help with medications?  Patient is in charge Who is in charge of the finances?  Patient is in charge    Patient have trouble swallowing? Patient denies   Does the patient cook?  Patient denies   Any kitchen accidents such as leaving the stove on? Patient denies   Any headaches?  Patient denies   Double vision? Patient denies   Any focal numbness or tingling?  Patient denies   Chronic back pain Patient denies   Unilateral weakness?  Patient denies   Any tremors?  Patient denies   Any history of anosmia?  Patient denies   Any incontinence of urine?  Patient denies   Any bowel dysfunction?   Patient denies      History of heavy alcohol intake?  Patient denies   History of heavy tobacco use?  Patient denies   Family history of dementia?  Mother  and a first cousin had Alzheimer's disease       Neuropsych evaluation 03/2024 Dr. Kitty Perkins Briefly, results suggested severe impairment surrounding all aspects of learning and memory. An isolated impairment across receptive language was likely caused by memory impairment. Additional weaknesses were exhibited across complex attention, executive functioning, and semantic fluency. Performance variability was exhibited across processing speed, confrontation naming, and visuospatial abilities. Relative to his previous evaluation in April 2024, decline was exhibited across attention/concentration, receptive language, and aspects of memory, particularly those surrounding recognition/consolidation. Outside of this, other assessed domains exhibited relative stability. Regarding the cause for memory impairment and progressive decline  over time, primary concerns continue to surround underlying Alzheimer's disease. Across memory testing, Mr. Maxson did not benefit from repeated exposure to information across learning trials, was fully amnestic (i.e., 0% retention) across all memory tasks after a brief delay, and performed poorly across yes/no recognition trials. 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 Impairments and/or weakness surrounding semantic fluency and confrontation naming would follow typical disease progression.       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   Chest pain    Dementia due to Alzheimer's disease  03/30/2024   Diabetic retinopathy 03/31/2016   Both eyes mild background diabetic retinopathy     Dyslipidemia associated with type 2 diabetes mellitus 11/17/2014   Erectile dysfunction due to arterial insufficiency 09/14/2021   History of colonic polyps 11/04/2020   Hypercholesterolemia    Hypertension associated with diabetes 11/17/2014   Hypotension due to hypovolemia 03/28/2024   Kidney stones    Post-inflammatory hyperpigmentation 03/30/2024   Postural dizziness with presyncope 03/28/2024   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   Ascorbic Acid (VITAMIN C) 500 MG CAPS Take 1 tablet by mouth at bedtime.   aspirin  81 MG EC tablet Take 81 mg by mouth at bedtime.   atorvastatin  (LIPITOR) 20 MG tablet TAKE 1 TABLET BY MOUTH EVERY DAY   carboxymethylcellulose (REFRESH PLUS) 0.5 % SOLN Place 1 drop into both eyes daily.   cetirizine  (ZYRTEC ) 10 MG tablet Take 10 mg by mouth at bedtime.   donepezil  (ARICEPT ) 10 MG tablet TAKE 1 TABLET BY MOUTH EVERY DAY   escitalopram  (LEXAPRO ) 5 MG tablet TAKE 1 TABLET (5 MG TOTAL) BY MOUTH DAILY.   glimepiride  (AMARYL ) 2 MG tablet Take 1 tablet (2 mg total) by mouth 2 (two) times daily.   latanoprost  (XALATAN ) 0.005 % ophthalmic solution Place 1 drop into both eyes at bedtime.   memantine  (NAMENDA ) 10 MG tablet TAKE 1 TABLET BY MOUTH TWICE A DAY   sulfamethoxazole -trimethoprim  (BACTRIM  DS) 800-160 MG tablet Take 1 tablet by mouth 2 (two) times daily.   tamsulosin  (FLOMAX ) 0.4 MG CAPS capsule TAKE 1 CAPSULE BY MOUTH EVERY DAY (Patient taking differently: Take 0.4 mg by mouth at bedtime.)   valsartan  (DIOVAN ) 160 MG tablet TAKE 1 TABLET BY MOUTH EVERY DAY   No facility-administered encounter medications on file as of 04/05/2024.     Objective:     PHYSICAL EXAMINATION:    VITALS:   Vitals:   04/05/24 0931 04/05/24 1022  BP: (!) 86/48 (!) 90/50  Pulse: 71   Resp: 18   SpO2: 98%   Weight: 138 lb (62.6 kg)   Height: 5' 6 (1.676 m)     GEN:  The patient appears stated age and is in NAD. HEENT:  Normocephalic, atraumatic.   Neurological examination:  General: NAD, well-groomed, appears stated age. Orientation: The patient is alert. Oriented to person, place and not to date.  Cranial nerves: There is good facial symmetry.The speech is fluent and clear. No aphasia or dysarthria. Fund of knowledge is appropriate. Recent and remote memory impaired  Attention and concentration are  normal.  Able to name objects and repeat phrases.  Hearing is decreased to conversational tone  Sensation: Sensation is intact to light touch throughout Motor: Strength is at least antigravity x4. DTR's 2/4 in UE/LE      05/17/2022    3:00 PM  Montreal Cognitive Assessment   Attention: Read list of digits (0/2) 2  Attention: Read list of letters (0/1) 1  Attention: Serial 7 subtraction  starting at 100 (0/3) 3  Language: Repeat phrase (0/2) 0  Language : Fluency (0/1) 0  Abstraction (0/2) 0  Delayed Recall (0/5) 0  Orientation (0/6) 6       09/28/2023    3:00 PM 03/29/2023    3:00 PM 08/24/2022    2:00 PM  MMSE - Mini Mental State Exam  Orientation to time 4 5 3   Orientation to Place 4 4 5   Registration 3 3 3   Attention/ Calculation 5 5 5   Recall 0 0 0  Language- name 2 objects 2 2 2   Language- repeat 1 1 1   Language- follow 3 step command 3 3 3   Language- read & follow direction 1 1 1   Write a sentence 1 1 1   Copy design 0 1 0  Total score 24 26 24        Movement examination: Tone: There is normal tone in the UE/LE Abnormal movements:  no tremor.  No myoclonus.  No asterixis.   Coordination:  There is no decremation with RAM's. Normal finger to nose  Gait and Station: The patient has no difficulty arising out of a deep-seated chair without the use of the hands. The patient's stride length is good.  Gait is cautious and narrow.   Thank you for allowing us  the opportunity to participate in the care of this nice patient. Please do not hesitate to contact us  for any questions or concerns.   Total time spent on today's visit was 30 minutes dedicated to this patient today, preparing to see patient, examining the patient, ordering tests and/or medications and counseling the patient, documenting clinical information in the EHR or other health record, independently interpreting results and communicating results to the patient/family, discussing treatment and goals, answering  patient's questions and coordinating care.  Cc:  Elvira Hammersmith, MD  Tex Filbert 04/05/2024 10:23 AM

## 2024-04-05 NOTE — Patient Instructions (Signed)
 It was a pleasure to see you today at our office. You are doing well!!!   Recommendations:   Memantine   to 10 mg tablets twice daily for better coverage    donepezil  to 10 mg daily  Follow up in 6 month    Whom to call:  Memory  decline, memory medications: Call our office [phone]   For psychiatric meds, mood meds: Please have your primary care physician manage these medications.   Counseling regarding caregiver distress, including caregiver depression, anxiety and issues regarding community resources, adult day care programs, adult living facilities, or memory care questions:   Feel free to contact Misty Court Distance, Social Worker at [phone]   For assessment of decision of mental capacity and competency:  Call Dr. Laverne Potter, geriatric psychiatrist at [phone]  For guidance in geriatric dementia issues please call [facility] [phone]  For guidance regarding [facility] and if placement were needed at the facility, contact Forrestine Ike, Social Worker tel: [phone]  If you have any severe symptoms of a stroke, or other severe issues such as confusion,severe chills or fever, etc call 911 or go to the ER as you may need to be evaluated further         RECOMMENDATIONS FOR ALL PATIENTS WITH MEMORY PROBLEMS: 1. Continue to exercise (Recommend 30 minutes of walking everyday, or 3 hours every week) 2. Increase social interactions - continue going to [place] and enjoy social gatherings with friends and family 3. Eat healthy, avoid fried foods and eat more fruits and vegetables 4. Maintain adequate blood pressure, blood sugar, and blood cholesterol level. Reducing the risk of stroke and cardiovascular disease also helps promoting better memory. 5. Avoid stressful situations. Live a simple life and avoid aggravations. Organize your time and prepare for the next day in anticipation. 6. Sleep well, avoid any interruptions of  sleep and avoid any distractions in the bedroom that may interfere with adequate sleep quality 7. Avoid sugar, avoid sweets as there is a strong link between excessive sugar intake, diabetes, and cognitive impairment We discussed the Mediterranean diet, which has been shown to help patients reduce the risk of progressive memory disorders and reduces cardiovascular risk. This includes eating fish, eat fruits and green leafy vegetables, nuts like almonds and hazelnuts, walnuts, and also use olive oil. Avoid fast foods and fried foods as much as possible. Avoid sweets and sugar as sugar use has been linked to worsening of memory function.  There is always a concern of gradual progression of memory problems. If this is the case, then we may need to adjust level of care according to patient needs. Support, both to the patient and caregiver, should then be put into place.      FALL PRECAUTIONS: Be cautious when walking. Scan the area for obstacles that may increase the risk of trips and falls. When getting up in the mornings, sit up at the edge of the bed for a few minutes before getting out of bed. Consider elevating the bed at the head end to avoid drop of blood pressure when getting up. Walk always in a well-lit room (use night lights in the walls). Avoid area rugs or power cords from appliances in the middle of the walkways. Use a walker or a cane if necessary and consider physical therapy for balance exercise. Get your eyesight checked regularly.  FINANCIAL OVERSIGHT: Supervision, especially oversight when making financial decisions or transactions is also recommended.  HOME SAFETY: Consider the safety  of the kitchen when operating appliances like stoves, microwave oven, and blender. Consider having supervision and share cooking responsibilities until no longer able to participate in those. Accidents with firearms and other hazards in the house should be identified and addressed as well.   ABILITY TO  BE LEFT ALONE: If patient is unable to contact 911 operator, consider using LifeLine, or when the need is there, arrange for someone to stay with patients. Smoking is a fire hazard, consider supervision or cessation. Risk of wandering should be assessed by caregiver and if detected at any point, supervision and safe proof recommendations should be instituted.  MEDICATION SUPERVISION: Inability to self-administer medication needs to be constantly addressed. Implement a mechanism to ensure safe administration of the medications.   DRIVING: Regarding driving, in patients with progressive memory problems, driving will be impaired. We advise to have someone else do the driving if trouble finding directions or if minor accidents are reported. Independent driving assessment is available to determine safety of driving.   If you are interested in the driving assessment, you can contact the following:  [employer] in [place] [phone]  [facility] [phone]  [facility] [phone] or [phone]    Mediterranean Diet A Mediterranean diet refers to food and lifestyle choices that are based on the traditions of countries located on the [employer]. This way of eating has been shown to help prevent certain conditions and improve outcomes for people who have chronic diseases, like kidney disease and heart disease. What are tips for following this plan? Lifestyle  Cook and eat meals together with your family, when possible. Drink enough fluid to keep your urine clear or pale yellow. Be physically active every day. This includes: Aerobic exercise like running or swimming. Leisure activities like gardening, walking, or housework. Get 7-8 hours of sleep each night. If recommended by your health care provider, drink red wine in moderation. This means 1 glass a day for nonpregnant women and 2 glasses a day for men. A  glass of wine equals 5 oz (150 mL). Reading food labels  Check the serving size of packaged foods. For foods such as rice and pasta, the serving size refers to the amount of cooked product, not dry. Check the total fat in packaged foods. Avoid foods that have saturated fat or trans fats. Check the ingredients list for added sugars, such as corn syrup. Shopping  At the grocery store, buy most of your food from the areas near the walls of the store. This includes: Fresh fruits and vegetables (produce). Grains, beans, nuts, and seeds. Some of these may be available in unpackaged forms or large amounts (in bulk). Fresh seafood. Poultry and eggs. Low-fat dairy products. Buy whole ingredients instead of prepackaged foods. Buy fresh fruits and vegetables in-season from local farmers markets. Buy frozen fruits and vegetables in resealable bags. If you do not have access to quality fresh seafood, buy precooked frozen shrimp or canned fish, such as tuna, salmon, or sardines. Buy small amounts of raw or cooked vegetables, salads, or olives from the deli or salad bar at your store. Stock your pantry so you always have certain foods on hand, such as olive oil, canned tuna, canned tomatoes, rice, pasta, and beans. Cooking  Cook foods with extra-virgin olive oil instead of using butter or other vegetable oils. Have meat as a side dish, and have vegetables or grains as your main dish. This means having meat in small portions  or adding small amounts of meat to foods like pasta or stew. Use beans or vegetables instead of meat in common dishes like chili or lasagna. Experiment with different cooking methods. Try roasting or broiling vegetables instead of steaming or sauteing them. Add frozen vegetables to soups, stews, pasta, or rice. Add nuts or seeds for added healthy fat at each meal. You can add these to yogurt, salads, or vegetable dishes. Marinate fish or vegetables using olive oil, lemon juice, garlic,  and fresh herbs. Meal planning  Plan to eat 1 vegetarian meal one day each week. Try to work up to 2 vegetarian meals, if possible. Eat seafood 2 or more times a week. Have healthy snacks readily available, such as: Vegetable sticks with hummus. Greek yogurt. Fruit and nut trail mix. Eat balanced meals throughout the week. This includes: Fruit: 2-3 servings a day Vegetables: 4-5 servings a day Low-fat dairy: 2 servings a day Fish, poultry, or lean meat: 1 serving a day Beans and legumes: 2 or more servings a week Nuts and seeds: 1-2 servings a day Whole grains: 6-8 servings a day Extra-virgin olive oil: 3-4 servings a day Limit red meat and sweets to only a few servings a month What are my food choices? Mediterranean diet Recommended Grains: Whole-grain pasta. Brown rice. Bulgar wheat. Polenta. Couscous. Whole-wheat bread. Dwyane Glad. Vegetables: Artichokes. Beets. Broccoli. Cabbage. Carrots. Eggplant. Green beans. Chard. Kale. Spinach. Onions. Leeks. Peas. Squash. Tomatoes. Peppers. Radishes. Fruits: Apples. Apricots. Avocado. Berries. Bananas. Cherries. Dates. Figs. Grapes. Lemons. Melon. Oranges. Peaches. Plums. Pomegranate. Meats and other protein foods: Beans. Almonds. Sunflower seeds. Pine nuts. Peanuts. Cod. Salmon. Scallops. Shrimp. Tuna. Tilapia. Clams. Oysters. Eggs. Dairy: Low-fat milk. Cheese. Greek yogurt. Beverages: Water. Red wine. Herbal tea. Fats and oils: Extra virgin olive oil. Avocado oil. Grape seed oil. Sweets and desserts: [place] yogurt with honey. Baked apples. Poached pears. Trail mix. Seasoning and other foods: Basil. Cilantro. Coriander. Cumin. Mint. Parsley. Sage. Rosemary. Tarragon. Garlic. Oregano. Thyme. Pepper. Balsalmic vinegar. Tahini. Hummus. Tomato sauce. Olives. Mushrooms. Limit these Grains: Prepackaged pasta or rice dishes. Prepackaged cereal with added sugar. Vegetables: Deep fried potatoes (french fries). Fruits: Fruit canned in  syrup. Meats and other protein foods: Beef. Pork. Lamb. Poultry with skin. Hot dogs. Helene Loader. Dairy: Ice cream. Sour cream. Whole milk. Beverages: Juice. Sugar-sweetened soft drinks. Beer. Liquor and spirits. Fats and oils: Butter. Canola oil. Vegetable oil. Beef fat (tallow). Lard. Sweets and desserts: Cookies. Cakes. Pies. Candy. Seasoning and other foods: Mayonnaise. Premade sauces and marinades. The items listed may not be a complete list. Talk with your dietitian about what dietary choices are right for you. Summary The Mediterranean diet includes both food and lifestyle choices. Eat a variety of fresh fruits and vegetables, beans, nuts, seeds, and whole grains. Limit the amount of red meat and sweets that you eat. Talk with your health care provider about whether it is safe for you to drink red wine in moderation. This means 1 glass a day for nonpregnant women and 2 glasses a day for men. A glass of wine equals 5 oz (150 mL). This information is not intended to replace advice given to you by your health care provider. Make sure you discuss any questions you have with your health care provider. Document Released: 05/27/2016 Document Revised: 06/29/2016 Document Reviewed: 05/27/2016 Elsevier Interactive Patient Education  2017 [employer].   We have sent a referral to [facility] for your MRI and they will call you directly to schedule your appointment.  They are located at [address] [facility]. If you need to contact them directly please call [phone].  Your provider has requested that you have labwork completed today. Please go to [facility] ([id number]) on the second floor of this building before leaving the office today. You do not need to check in. If you are not called within 15 minutes please check with the front desk.

## 2024-04-06 ENCOUNTER — Ambulatory Visit: Payer: Self-pay | Admitting: Emergency Medicine

## 2024-04-09 ENCOUNTER — Ambulatory Visit: Payer: Medicare Other | Admitting: Physician Assistant

## 2024-04-10 ENCOUNTER — Telehealth: Payer: Self-pay | Admitting: Emergency Medicine

## 2024-04-10 NOTE — Telephone Encounter (Unsigned)
 Copied from CRM [id number]. Topic: Clinical - Medication Question >> Apr 10, 2024  9:59 AM Ernestene P wrote: Reason for CRM: Pt advise he is on two blood pressure medications- they are suppose to help with blood pressure but he has been experiencing low blood pressure. He prefer to speak to PCP or nurse. [phone]

## 2024-04-11 ENCOUNTER — Ambulatory Visit: Admitting: Emergency Medicine

## 2024-04-11 ENCOUNTER — Encounter: Payer: Self-pay | Admitting: Emergency Medicine

## 2024-04-11 VITALS — BP 104/54 | HR 65 | Temp 98.8°F | Ht 66.0 in | Wt 137.0 lb

## 2024-04-11 DIAGNOSIS — H9193 Unspecified hearing loss, bilateral: Secondary | ICD-10-CM | POA: Diagnosis not present

## 2024-04-11 DIAGNOSIS — E1159 Type 2 diabetes mellitus with other circulatory complications: Secondary | ICD-10-CM

## 2024-04-11 DIAGNOSIS — Z7984 Long term (current) use of oral hypoglycemic drugs: Secondary | ICD-10-CM | POA: Diagnosis not present

## 2024-04-11 DIAGNOSIS — I152 Hypertension secondary to endocrine disorders: Secondary | ICD-10-CM | POA: Diagnosis not present

## 2024-04-11 NOTE — Assessment & Plan Note (Addendum)
 BP Readings from Last 3 Encounters:  04/11/24 (!) 104/54  04/05/24 (!) 90/50  04/04/24 (!) 106/58  Low blood pressure readings in the office and at home Recommend to stop amlodipine  and continue valsartan  160 mg daily.  Advised to monitor blood pressure readings at home daily for the next several weeks and contact the office if numbers persistently abnormal. Hemoglobin A1c 6.2 Cardiovascular risks associated with both conditions discussed Diet and nutrition discussed.  Encouraged to increase daily caloric intake Continue glimepiride  2 mg twice a day Recommend follow-up in 6 months

## 2024-04-11 NOTE — Progress Notes (Signed)
 Tony Price 83 y.o.   Chief Complaint  Patient presents with   Hypertension    Patient here for B/P check, patient wants to know if both bp medications is necessary     HISTORY OF PRESENT ILLNESS: This is a 83 y.o. male here for follow-up of hypertension Presently on valsartan  and amlodipine . Blood pressure readings at home on the low normal side.  Questioning if he needs both medications adjustment Also requesting referral for hearing testing BP Readings from Last 3 Encounters:  04/05/24 (!) 90/50  04/04/24 (!) 106/58  03/28/24 117/63     Hypertension Pertinent negatives include no chest pain, headaches, palpitations or shortness of breath.     Prior to Admission medications   Medication Sig Start Date End Date Taking? Authorizing Provider  amLODipine  (NORVASC ) 10 MG tablet Take 1 tablet (10 mg total) by mouth daily. 03/15/24   Purcell Emil Schanz, MD  Ascorbic Acid (VITAMIN C) 500 MG CAPS Take 1 tablet by mouth at bedtime.    [provider]  aspirin  81 MG EC tablet Take 81 mg by mouth at bedtime.    [provider]  atorvastatin  (LIPITOR) 20 MG tablet TAKE 1 TABLET BY MOUTH EVERY DAY 12/20/23   Purcell Emil Schanz, MD  carboxymethylcellulose (REFRESH PLUS) 0.5 % SOLN Place 1 drop into both eyes daily.    [provider]  cetirizine  (ZYRTEC ) 10 MG tablet Take 10 mg by mouth at bedtime.    [provider]  donepezil  (ARICEPT ) 10 MG tablet TAKE 1 TABLET BY MOUTH EVERY DAY 04/02/24   Dina, Sara E, PA-C  escitalopram  (LEXAPRO ) 5 MG tablet TAKE 1 TABLET (5 MG TOTAL) BY MOUTH DAILY. 10/13/23   Wertman, Sara E, PA-C  glimepiride  (AMARYL ) 2 MG tablet Take 1 tablet (2 mg total) by mouth 2 (two) times daily. 03/13/24   Vansh Reckart Jose, MD  latanoprost  (XALATAN ) 0.005 % ophthalmic solution Place 1 drop into both eyes at bedtime. 08/31/19   [provider]  memantine  (NAMENDA ) 10 MG tablet TAKE 1 TABLET BY MOUTH TWICE A DAY  03/13/24   Wertman, Sara E, PA-C  sulfamethoxazole -trimethoprim  (BACTRIM  DS) 800-160 MG tablet Take 1 tablet by mouth 2 (two) times daily. 03/28/24   Arlon Carliss ORN, DO  tamsulosin  (FLOMAX ) 0.4 MG CAPS capsule TAKE 1 CAPSULE BY MOUTH EVERY DAY Patient taking differently: Take 0.4 mg by mouth at bedtime. 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   History of colonic polyps 11/04/2020   Diabetic retinopathy 03/31/2016   Dyslipidemia associated with type 2 diabetes mellitus 11/17/2014   Hypertension associated with diabetes 11/17/2014    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   Chest pain    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   Diabetic retinopathy  03/31/2016   Both eyes mild background diabetic retinopathy     Dyslipidemia associated with type 2 diabetes mellitus 11/17/2014   Erectile dysfunction due to arterial insufficiency 09/14/2021   History of colonic polyps 11/04/2020   Hypercholesterolemia    Hypertension associated with diabetes 11/17/2014   Hypotension due to hypovolemia 03/28/2024   Kidney stones    Post-inflammatory hyperpigmentation 03/30/2024   Postural dizziness with presyncope 03/28/2024   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    Type II diabetes mellitus    Urinary tract infection 11/17/2014   Weight loss 04/28/2022    Past Surgical History:  Procedure Laterality Date   CATARACT EXTRACTION     EYE SURGERY     PROSTATE BIOPSY     ROTATOR CUFF REPAIR     Right    Social History   Socioeconomic History   Marital status: Divorced    Spouse name: Not on file   Number of children: 3   Years of education: 12   Highest education level: High school graduate  Occupational History   Occupation: Retired  Tobacco Use   Smoking status: Former    Types: Cigarettes   Smokeless tobacco: Never  Substance and Sexual Activity   Alcohol use: No    Alcohol/week: 0.0 standard drinks of alcohol   Drug use: No   Sexual activity: Not Currently  Other Topics Concern   Not on file  Social History Narrative   Right handed   No caffeine   No falls   One story home   Social Drivers of Health   Financial Resource Strain: Low Risk  (07/13/2023)   Overall Financial Resource Strain (CARDIA)    Difficulty of Paying Living Expenses: Not hard at all  Food Insecurity: No Food Insecurity (03/28/2024)   Hunger Vital Sign    Worried About Running Out of Food in the Last Year: Never true    Ran Out of Food in the Last Year: Never true  Transportation Needs: No Transportation Needs (03/28/2024)   PRAPARE - Administrator, Civil Service (Medical): No    Lack of Transportation (Non-Medical): No  Physical Activity: Sufficiently Active (07/13/2023)   Exercise Vital Sign    Days of Exercise per Week: 3 days    Minutes of Exercise per Session: 50 min  Stress: No Stress Concern Present (07/13/2023)   [employer] of Occupational Health - Occupational Stress Questionnaire    Feeling of Stress : Not at all  Social Connections: Unknown (07/13/2023)   Social Connection and Isolation Panel    Frequency of Communication with Friends and Family: More than three times a week    Frequency of Social Gatherings with Friends and  Family: More than three times a week    Attends Religious Services: Patient unable to answer    Active Member of Clubs or Organizations: No    Attends Banker Meetings: Not on file    Marital Status: Divorced  Intimate Partner Violence: Not At Risk (03/28/2024)   Humiliation, Afraid, Rape, and Kick questionnaire    Fear of Current or Ex-Partner: No    Emotionally Abused: No    Physically Abused: No    Sexually Abused: No    Family History  Problem Relation Age of Onset   Dementia Cousin        maternal; x2     Review of Systems  Constitutional: Negative.  Negative for chills and fever.  HENT:  Positive for hearing loss. Negative for congestion and sore  throat.   Respiratory: Negative.  Negative for cough and shortness of breath.   Cardiovascular: Negative.  Negative for chest pain and palpitations.  Gastrointestinal:  Negative for abdominal pain, diarrhea, nausea and vomiting.  Genitourinary: Negative.  Negative for dysuria and hematuria.  Skin: Negative.  Negative for rash.  Neurological: Negative.  Negative for dizziness and headaches.  All other systems reviewed and are negative.   Today's Vitals   04/11/24 1411  BP: (!) 104/54  Pulse: 65  Temp: 98.8 F (37.1 C)  TempSrc: Oral  SpO2: 97%  Weight: 137 lb (62.1 kg)  Height: 5' 6 (1.676 m)   Body mass index is 22.11 kg/m.   Physical Exam Vitals reviewed.  Constitutional:      Appearance: Normal appearance.  HENT:     Head: Normocephalic.     Right Ear: Tympanic membrane, ear canal and external ear normal.     Left Ear: Tympanic membrane, ear canal and external ear normal.     Mouth/Throat:     Mouth: Mucous membranes are moist.     Pharynx: Oropharynx is clear.   Eyes:     Extraocular Movements: Extraocular movements intact.    Cardiovascular:     Rate and Rhythm: Normal rate and regular rhythm.     Pulses: Normal pulses.     Heart sounds: Normal heart sounds.  Pulmonary:     Effort:  Pulmonary effort is normal.     Breath sounds: Normal breath sounds.   Musculoskeletal:     Cervical back: No tenderness.  Lymphadenopathy:     Cervical: No cervical adenopathy.   Skin:    General: Skin is warm and dry.   Neurological:     Mental Status: He is alert and oriented to person, place, and time.   Psychiatric:        Mood and Affect: Mood normal.        Behavior: Behavior normal.      ASSESSMENT & PLAN: A total of 35 minutes was spent with the patient and counseling/coordination of care regarding preparing for this visit, review of most recent office visit notes, review of multiple chronic medical conditions and their management, review of all medications and changes made, review of most recent bloodwork results, review of health maintenance items, education on nutrition, prognosis, documentation, and need for follow up.   Problem List Items Addressed This Visit       Cardiovascular and Mediastinum   Hypertension associated with diabetes - Primary   BP Readings from Last 3 Encounters:  04/11/24 (!) 104/54  04/05/24 (!) 90/50  04/04/24 (!) 106/58  Low blood pressure readings in the office and at home Recommend to stop amlodipine  and continue valsartan  160 mg daily.  Advised to monitor blood pressure readings at home daily for the next several weeks and contact the office if numbers persistently abnormal. Hemoglobin A1c 6.2 Cardiovascular risks associated with both conditions discussed Diet and nutrition discussed.  Encouraged to increase daily caloric intake Continue glimepiride  2 mg twice a day Recommend follow-up in 6 months       Other Visit Diagnoses       Bilateral hearing loss, unspecified hearing loss type       Relevant Orders   Ambulatory referral to Audiology      Patient Instructions  Stop amlodipine  Continue valsartan  Monitor blood pressure readings daily for the next several weeks and contact the office if numbers persistently  abnormal  Hypertension, Adult High blood pressure (hypertension) is when  the force of blood pumping through the arteries is too strong. The arteries are the blood vessels that carry blood from the heart throughout the body. Hypertension forces the heart to work harder to pump blood and may cause arteries to become narrow or stiff. Untreated or uncontrolled hypertension can lead to a heart attack, heart failure, a stroke, kidney disease, and other problems. A blood pressure reading consists of a higher number over a lower number. Ideally, your blood pressure should be below 120/80. The first (top) number is called the systolic pressure. It is a measure of the pressure in your arteries as your heart beats. The second (bottom) number is called the diastolic pressure. It is a measure of the pressure in your arteries as the heart relaxes. What are the causes? The exact cause of this condition is not known. There are some conditions that result in high blood pressure. What increases the risk? Certain factors may make you more likely to develop high blood pressure. Some of these risk factors are under your control, including: Smoking. Not getting enough exercise or physical activity. Being overweight. Having too much fat, sugar, calories, or salt (sodium) in your diet. Drinking too much alcohol. Other risk factors include: Having a personal history of heart disease, diabetes, high cholesterol, or kidney disease. Stress. Having a family history of high blood pressure and high cholesterol. Having obstructive sleep apnea. Age. The risk increases with age. What are the signs or symptoms? High blood pressure may not cause symptoms. Very high blood pressure (hypertensive crisis) may cause: Headache. Fast or irregular heartbeats (palpitations). Shortness of breath. Nosebleed. Nausea and vomiting. Vision changes. Severe chest pain, dizziness, and seizures. How is this diagnosed? This condition is  diagnosed by measuring your blood pressure while you are seated, with your arm resting on a flat surface, your legs uncrossed, and your feet flat on the floor. The cuff of the blood pressure monitor will be placed directly against the skin of your upper arm at the level of your heart. Blood pressure should be measured at least twice using the same arm. Certain conditions can cause a difference in blood pressure between your right and left arms. If you have a high blood pressure reading during one visit or you have normal blood pressure with other risk factors, you may be asked to: Return on a different day to have your blood pressure checked again. Monitor your blood pressure at home for 1 week or longer. If you are diagnosed with hypertension, you may have other blood or imaging tests to help your health care provider understand your overall risk for other conditions. How is this treated? This condition is treated by making healthy lifestyle changes, such as eating healthy foods, exercising more, and reducing your alcohol intake. You may be referred for counseling on a healthy diet and physical activity. Your health care provider may prescribe medicine if lifestyle changes are not enough to get your blood pressure under control and if: Your systolic blood pressure is above 130. Your diastolic blood pressure is above 80. Your personal target blood pressure may vary depending on your medical conditions, your age, and other factors. Follow these instructions at home: Eating and drinking  Eat a diet that is high in fiber and potassium, and low in sodium, added sugar, and fat. An example of this eating plan is called the DASH diet. DASH stands for Dietary Approaches to Stop Hypertension. To eat this way: Eat plenty of fresh fruits and vegetables. Try to  fill one half of your plate at each meal with fruits and vegetables. Eat whole grains, such as whole-wheat pasta, brown rice, or whole-grain bread. Fill  about one fourth of your plate with whole grains. Eat or drink low-fat dairy products, such as skim milk or low-fat yogurt. Avoid fatty cuts of meat, processed or cured meats, and poultry with skin. Fill about one fourth of your plate with lean proteins, such as fish, chicken without skin, beans, eggs, or tofu. Avoid pre-made and processed foods. These tend to be higher in sodium, added sugar, and fat. Reduce your daily sodium intake. Many people with hypertension should eat less than 1,500 mg of sodium a day. Do not drink alcohol if: Your health care provider tells you not to drink. You are pregnant, may be pregnant, or are planning to become pregnant. If you drink alcohol: Limit how much you have to: 0-1 drink a day for women. 0-2 drinks a day for men. Know how much alcohol is in your drink. In the [place] one drink equals one 12 oz bottle of beer (355 mL), one 5 oz glass of wine (148 mL), or one 1 oz glass of hard liquor (44 mL). Lifestyle  Work with your health care provider to maintain a healthy body weight or to lose weight. Ask what an ideal weight is for you. Get at least 30 minutes of exercise that causes your heart to beat faster (aerobic exercise) most days of the week. Activities may include walking, swimming, or biking. Include exercise to strengthen your muscles (resistance exercise), such as Pilates or lifting weights, as part of your weekly exercise routine. Try to do these types of exercises for 30 minutes at least 3 days a week. Do not use any products that contain nicotine or tobacco. These products include cigarettes, chewing tobacco, and vaping devices, such as e-cigarettes. If you need help quitting, ask your health care provider. Monitor your blood pressure at home as told by your health care provider. Keep all follow-up visits. This is important. Medicines Take over-the-counter and prescription medicines only as told by your health care provider. Follow directions  carefully. Blood pressure medicines must be taken as prescribed. Do not skip doses of blood pressure medicine. Doing this puts you at risk for problems and can make the medicine less effective. Ask your health care provider about side effects or reactions to medicines that you should watch for. Contact a health care provider if you: Think you are having a reaction to a medicine you are taking. Have headaches that keep coming back (recurring). Feel dizzy. Have swelling in your ankles. Have trouble with your vision. Get help right away if you: Develop a severe headache or confusion. Have unusual weakness or numbness. Feel faint. Have severe pain in your chest or abdomen. Vomit repeatedly. Have trouble breathing. These symptoms may be an emergency. Get help right away. Call 911. Do not wait to see if the symptoms will go away. Do not drive yourself to the hospital. Summary Hypertension is when the force of blood pumping through your arteries is too strong. If this condition is not controlled, it may put you at risk for serious complications. Your personal target blood pressure may vary depending on your medical conditions, your age, and other factors. For most people, a normal blood pressure is less than 120/80. Hypertension is treated with lifestyle changes, medicines, or a combination of both. Lifestyle changes include losing weight, eating a healthy, low-sodium diet, exercising more, and limiting alcohol. This  information is not intended to replace advice given to you by your health care provider. Make sure you discuss any questions you have with your health care provider. Document Revised: 08/11/2021 Document Reviewed: 08/11/2021 Elsevier Patient Education  2024 [employer]    Emil Schaumann, MD [facility]

## 2024-04-11 NOTE — Patient Instructions (Signed)
 Stop amlodipine  Continue valsartan  Monitor blood pressure readings daily for the next several weeks and contact the office if numbers persistently abnormal  Hypertension, Adult High blood pressure (hypertension) is when the force of blood pumping through the arteries is too strong. The arteries are the blood vessels that carry blood from the heart throughout the body. Hypertension forces the heart to work harder to pump blood and may cause arteries to become narrow or stiff. Untreated or uncontrolled hypertension can lead to a heart attack, heart failure, a stroke, kidney disease, and other problems. A blood pressure reading consists of a higher number over a lower number. Ideally, your blood pressure should be below 120/80. The first (top) number is called the systolic pressure. It is a measure of the pressure in your arteries as your heart beats. The second (bottom) number is called the diastolic pressure. It is a measure of the pressure in your arteries as the heart relaxes. What are the causes? The exact cause of this condition is not known. There are some conditions that result in high blood pressure. What increases the risk? Certain factors may make you more likely to develop high blood pressure. Some of these risk factors are under your control, including: Smoking. Not getting enough exercise or physical activity. Being overweight. Having too much fat, sugar, calories, or salt (sodium) in your diet. Drinking too much alcohol. Other risk factors include: Having a personal history of heart disease, diabetes, high cholesterol, or kidney disease. Stress. Having a family history of high blood pressure and high cholesterol. Having obstructive sleep apnea. Age. The risk increases with age. What are the signs or symptoms? High blood pressure may not cause symptoms. Very high blood pressure (hypertensive crisis) may cause: Headache. Fast or irregular heartbeats (palpitations). Shortness  of breath. Nosebleed. Nausea and vomiting. Vision changes. Severe chest pain, dizziness, and seizures. How is this diagnosed? This condition is diagnosed by measuring your blood pressure while you are seated, with your arm resting on a flat surface, your legs uncrossed, and your feet flat on the floor. The cuff of the blood pressure monitor will be placed directly against the skin of your upper arm at the level of your heart. Blood pressure should be measured at least twice using the same arm. Certain conditions can cause a difference in blood pressure between your right and left arms. If you have a high blood pressure reading during one visit or you have normal blood pressure with other risk factors, you may be asked to: Return on a different day to have your blood pressure checked again. Monitor your blood pressure at home for 1 week or longer. If you are diagnosed with hypertension, you may have other blood or imaging tests to help your health care provider understand your overall risk for other conditions. How is this treated? This condition is treated by making healthy lifestyle changes, such as eating healthy foods, exercising more, and reducing your alcohol intake. You may be referred for counseling on a healthy diet and physical activity. Your health care provider may prescribe medicine if lifestyle changes are not enough to get your blood pressure under control and if: Your systolic blood pressure is above 130. Your diastolic blood pressure is above 80. Your personal target blood pressure may vary depending on your medical conditions, your age, and other factors. Follow these instructions at home: Eating and drinking  Eat a diet that is high in fiber and potassium, and low in sodium, added sugar, and fat. An  example of this eating plan is called the DASH diet. DASH stands for Dietary Approaches to Stop Hypertension. To eat this way: Eat plenty of fresh fruits and vegetables. Try to fill  one half of your plate at each meal with fruits and vegetables. Eat whole grains, such as whole-wheat pasta, brown rice, or whole-grain bread. Fill about one fourth of your plate with whole grains. Eat or drink low-fat dairy products, such as skim milk or low-fat yogurt. Avoid fatty cuts of meat, processed or cured meats, and poultry with skin. Fill about one fourth of your plate with lean proteins, such as fish, chicken without skin, beans, eggs, or tofu. Avoid pre-made and processed foods. These tend to be higher in sodium, added sugar, and fat. Reduce your daily sodium intake. Many people with hypertension should eat less than 1,500 mg of sodium a day. Do not drink alcohol if: Your health care provider tells you not to drink. You are pregnant, may be pregnant, or are planning to become pregnant. If you drink alcohol: Limit how much you have to: 0-1 drink a day for women. 0-2 drinks a day for men. Know how much alcohol is in your drink. In the [place] one drink equals one 12 oz bottle of beer (355 mL), one 5 oz glass of wine (148 mL), or one 1 oz glass of hard liquor (44 mL). Lifestyle  Work with your health care provider to maintain a healthy body weight or to lose weight. Ask what an ideal weight is for you. Get at least 30 minutes of exercise that causes your heart to beat faster (aerobic exercise) most days of the week. Activities may include walking, swimming, or biking. Include exercise to strengthen your muscles (resistance exercise), such as Pilates or lifting weights, as part of your weekly exercise routine. Try to do these types of exercises for 30 minutes at least 3 days a week. Do not use any products that contain nicotine or tobacco. These products include cigarettes, chewing tobacco, and vaping devices, such as e-cigarettes. If you need help quitting, ask your health care provider. Monitor your blood pressure at home as told by your health care provider. Keep all follow-up visits.  This is important. Medicines Take over-the-counter and prescription medicines only as told by your health care provider. Follow directions carefully. Blood pressure medicines must be taken as prescribed. Do not skip doses of blood pressure medicine. Doing this puts you at risk for problems and can make the medicine less effective. Ask your health care provider about side effects or reactions to medicines that you should watch for. Contact a health care provider if you: Think you are having a reaction to a medicine you are taking. Have headaches that keep coming back (recurring). Feel dizzy. Have swelling in your ankles. Have trouble with your vision. Get help right away if you: Develop a severe headache or confusion. Have unusual weakness or numbness. Feel faint. Have severe pain in your chest or abdomen. Vomit repeatedly. Have trouble breathing. These symptoms may be an emergency. Get help right away. Call 911. Do not wait to see if the symptoms will go away. Do not drive yourself to the hospital. Summary Hypertension is when the force of blood pumping through your arteries is too strong. If this condition is not controlled, it may put you at risk for serious complications. Your personal target blood pressure may vary depending on your medical conditions, your age, and other factors. For most people, a normal blood pressure is  less than 120/80. Hypertension is treated with lifestyle changes, medicines, or a combination of both. Lifestyle changes include losing weight, eating a healthy, low-sodium diet, exercising more, and limiting alcohol. This information is not intended to replace advice given to you by your health care provider. Make sure you discuss any questions you have with your health care provider. Document Revised: 08/11/2021 Document Reviewed: 08/11/2021 Elsevier Patient Education  2024 [employer].

## 2024-04-13 ENCOUNTER — Telehealth: Payer: Self-pay

## 2024-04-13 NOTE — Telephone Encounter (Unsigned)
 Copied from CRM [id number]. Topic: Referral - Status >> Apr 13, 2024  1:59 PM [place] A wrote: Reason for CRM: Patients daughter would like to know the name of the hearing test office-would like call back not tech savvy regarding [employer]

## 2024-04-17 DIAGNOSIS — F02A0 Dementia in other diseases classified elsewhere, mild, without behavioral disturbance, psychotic disturbance, mood disturbance, and anxiety: Secondary | ICD-10-CM | POA: Diagnosis not present

## 2024-05-18 DIAGNOSIS — F02A0 Dementia in other diseases classified elsewhere, mild, without behavioral disturbance, psychotic disturbance, mood disturbance, and anxiety: Secondary | ICD-10-CM | POA: Diagnosis not present

## 2024-05-22 ENCOUNTER — Ambulatory Visit: Admitting: Physician Assistant

## 2024-05-30 ENCOUNTER — Other Ambulatory Visit: Payer: Self-pay | Admitting: Physician Assistant

## 2024-05-30 ENCOUNTER — Other Ambulatory Visit: Payer: Self-pay | Admitting: Emergency Medicine

## 2024-05-30 DIAGNOSIS — E785 Hyperlipidemia, unspecified: Secondary | ICD-10-CM

## 2024-05-31 ENCOUNTER — Ambulatory Visit: Payer: Self-pay

## 2024-05-31 NOTE — Telephone Encounter (Signed)
 FYI Only or Action Required?: FYI only for provider.  Patient was last seen in primary care on 04/11/2024 by Purcell Emil Schanz, MD.  Called Nurse Triage reporting Anxiety.  Symptoms began about a month ago.  Interventions attempted: Nothing.  Symptoms are: gradually worsening.  Triage Disposition: See PCP When Office is Open (Within 3 Days)  Patient/caregiver understands and will follow disposition?: Yes     Copied from CRM [id number]. 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 He is very nervous at night and not sleeping well. Please call Denita at [phone] to discuss. Reason for Disposition  MODERATE anxiety (e.g., persistent or frequent anxiety symptoms; interferes with sleep, school, or work)  Answer Assessment - Initial Assessment Questions 1. CONCERN: Did anything happen that prompted you to call today?      With his dementia... he's very antsy he's not sleeping very well 2. ANXIETY SYMPTOMS: Can you describe how you (your loved one; patient) have been feeling? (e.g., tense, restless, panicky, anxious, keyed up, overwhelmed, sense of impending doom).      Daughter reports pt gets snappy 3. ONSET: How long have you been feeling this way? (e.g., hours, days, weeks)     > month 4. SEVERITY: How would you rate the level of anxiety? (e.g., 0 - 10; or mild, moderate, severe).     unknown 5. FUNCTIONAL IMPAIRMENT: How have these feelings affected your ability to do daily activities? Have you had more difficulty than usual doing your normal daily activities? (e.g., getting better, same, worse; self-care, school, work, interactions)     Sleeping impairment 6. HISTORY: Have you felt this way before? Have you ever been diagnosed with an anxiety problem in the past? (e.g., generalized anxiety disorder, panic attacks, PTSD). If Yes, ask: How was  this problem treated? (e.g., medicines, counseling, etc.)     [name] 7. RISK OF HARM - SUICIDAL IDEATION: Do you ever have thoughts of hurting or killing yourself? If Yes, ask:  Do you have these feelings now? Do you have a plan on how you would do this?     [name] 8. TREATMENT:  What has been done so far to treat this anxiety? (e.g., medicines, relaxation strategies). What has helped?     [name] 9. THERAPIST: Do you have a counselor or therapist? If Yes, ask: What is their name?     [name] 10. POTENTIAL TRIGGERS: Do you drink caffeinated beverages (e.g., coffee, colas, teas), and how much daily? Do you drink alcohol or use any drugs? Have you started any new medicines recently?       dementia 11. PATIENT SUPPORT: Who is with you now? Who do you live with? Do you have family or friends who you can talk to?        Lives with someone in a home 12. OTHER SYMPTOMS: Do you have any other symptoms? (e.g., feeling depressed, trouble concentrating, trouble sleeping, trouble breathing, palpitations or fast heartbeat, chest pain, sweating, nausea, or diarrhea)       Poor appetite, sleep trouble, antsy 13. PREGNANCY: Is there any chance you are pregnant? When was your last menstrual period?       [name]  Protocols used: Anxiety and Panic Attack-A-AH

## 2024-06-01 NOTE — Telephone Encounter (Signed)
 Spoke with Denita and she states the aid will be coming with patient on Monday for his appointment, just wanted to make sure the Anxiety was addressed and if it was okay for her to hear in from her phone during the visit

## 2024-06-04 ENCOUNTER — Other Ambulatory Visit: Payer: Self-pay | Admitting: Physician Assistant

## 2024-06-04 ENCOUNTER — Encounter: Payer: Self-pay | Admitting: Emergency Medicine

## 2024-06-04 ENCOUNTER — Telehealth: Payer: Self-pay

## 2024-06-04 ENCOUNTER — Ambulatory Visit (INDEPENDENT_AMBULATORY_CARE_PROVIDER_SITE_OTHER): Admitting: Emergency Medicine

## 2024-06-04 VITALS — BP 118/56 | HR 67 | Temp 98.7°F | Ht 66.0 in | Wt 139.0 lb

## 2024-06-04 DIAGNOSIS — E785 Hyperlipidemia, unspecified: Secondary | ICD-10-CM

## 2024-06-04 DIAGNOSIS — I152 Hypertension secondary to endocrine disorders: Secondary | ICD-10-CM

## 2024-06-04 DIAGNOSIS — E1159 Type 2 diabetes mellitus with other circulatory complications: Secondary | ICD-10-CM | POA: Diagnosis not present

## 2024-06-04 DIAGNOSIS — N401 Enlarged prostate with lower urinary tract symptoms: Secondary | ICD-10-CM | POA: Diagnosis not present

## 2024-06-04 DIAGNOSIS — F0280 Dementia in other diseases classified elsewhere without behavioral disturbance: Secondary | ICD-10-CM | POA: Diagnosis not present

## 2024-06-04 DIAGNOSIS — N138 Other obstructive and reflux uropathy: Secondary | ICD-10-CM | POA: Diagnosis not present

## 2024-06-04 DIAGNOSIS — Z7984 Long term (current) use of oral hypoglycemic drugs: Secondary | ICD-10-CM | POA: Diagnosis not present

## 2024-06-04 DIAGNOSIS — E1169 Type 2 diabetes mellitus with other specified complication: Secondary | ICD-10-CM

## 2024-06-04 DIAGNOSIS — G309 Alzheimer's disease, unspecified: Secondary | ICD-10-CM

## 2024-06-04 NOTE — Assessment & Plan Note (Signed)
Asymptomatic.  Continues Flomax 0.4 mg daily

## 2024-06-04 NOTE — Telephone Encounter (Signed)
 Copied from CRM [id number]. Topic: General - Other >> Jun 04, 2024  2:34 PM Mercedes MATSU wrote: Reason for CRM: Patients daughter Jeren Dufrane called in wanting to know if a nurse could call her to go over how her fathers appointment went today. Patients daughter states she can be reached at [phone].

## 2024-06-04 NOTE — Assessment & Plan Note (Signed)
 Chronic stable condition Sees neurologist on a regular basis Continue Aricept .  Namenda  was recently discontinued.

## 2024-06-04 NOTE — Progress Notes (Signed)
 Tony Price 83 y.o.   Chief Complaint  Patient presents with   Insomnia    Patient here with Aid. Patient has been having trouble sleeping and has had some behavioral changes/ anxiety. Aid mentions that he's in and out about remember something's and patient did not seem sure about todays visit. Aid said he is going to call Denita daughter during the visit while pcp was in room    HISTORY OF PRESENT ILLNESS: This is a 83 y.o. male accompanied by aide. Also wife on the phone during the interview. No trouble sleeping.  Some confusion regarding dementia medications. No other complaints or medical concerns today.  Insomnia     Prior to Admission medications   Medication Sig Start Date End Date Taking? Authorizing Provider  amLODipine  (NORVASC ) 10 MG tablet Take 1 tablet (10 mg total) by mouth daily. 03/15/24  Yes Ileah Falkenstein, Emil Schanz, MD  Ascorbic Acid (VITAMIN C) 500 MG CAPS Take 1 tablet by mouth at bedtime.   Yes [provider]  aspirin  81 MG EC tablet Take 81 mg by mouth at bedtime.   Yes [provider]  atorvastatin  (LIPITOR) 20 MG tablet TAKE 1 TABLET BY MOUTH EVERY DAY 05/30/24  Yes Marcellino Fidalgo, Emil Schanz, MD  carboxymethylcellulose (REFRESH PLUS) 0.5 % SOLN Place 1 drop into both eyes daily.   Yes [provider]  cetirizine  (ZYRTEC ) 10 MG tablet Take 10 mg by mouth at bedtime.   Yes [provider]  donepezil  (ARICEPT ) 10 MG tablet TAKE 1 TABLET BY MOUTH EVERY DAY 06/04/24  Yes Wertman, Sara E, PA-C  escitalopram  (LEXAPRO ) 5 MG tablet TAKE 1 TABLET BY MOUTH EVERY DAY 05/30/24  Yes Wertman, Sara E, PA-C  glimepiride  (AMARYL ) 2 MG tablet Take 1 tablet (2 mg total) by mouth 2 (two) times daily. 03/13/24  Yes Callista Hoh Jose, MD  latanoprost  (XALATAN ) 0.005 % ophthalmic solution Place 1 drop into both eyes at bedtime. 08/31/19  Yes [provider]  memantine  (NAMENDA ) 10 MG tablet TAKE 1 TABLET BY MOUTH TWICE A DAY 03/13/24  Yes  Wertman, Sara E, PA-C  sulfamethoxazole -trimethoprim  (BACTRIM  DS) 800-160 MG tablet Take 1 tablet by mouth 2 (two) times daily. 03/28/24  Yes Arlon Carliss ORN, DO  tamsulosin  (FLOMAX ) 0.4 MG CAPS capsule TAKE 1 CAPSULE BY MOUTH EVERY DAY Patient taking differently: Take 0.4 mg by mouth at bedtime. 12/30/23  Yes Purcell Emil Schanz, MD  valsartan  (DIOVAN ) 160 MG tablet TAKE 1 TABLET BY MOUTH EVERY DAY 12/21/23  Yes Purcell Emil Schanz, MD    No Known Allergies  Patient Active Problem List   Diagnosis Date Noted   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   Postural dizziness with presyncope 03/28/2024   Aortic stenosis 03/28/2024   Hypotension due to hypovolemia 03/28/2024   Hypercholesterolemia 02/03/2023   Type II diabetes mellitus    Weight loss 04/28/2022   Acute cystitis without hematuria 10/01/2021   Erectile dysfunction due to arterial insufficiency 09/14/2021   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   History of colonic polyps 11/04/2020   Diabetic retinopathy 03/31/2016   Dyslipidemia associated with type 2 diabetes mellitus 11/17/2014   Hypertension associated with diabetes 11/17/2014    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   Chest pain    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   Diabetic retinopathy 03/31/2016   Both  eyes mild background diabetic retinopathy     Dyslipidemia associated with type 2 diabetes mellitus 11/17/2014   Erectile dysfunction due to arterial insufficiency 09/14/2021   History of colonic polyps 11/04/2020   Hypercholesterolemia    Hypertension associated with diabetes 11/17/2014   Hypotension due to hypovolemia 03/28/2024   Kidney stones    Post-inflammatory hyperpigmentation 03/30/2024   Postural dizziness with presyncope 03/28/2024   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    Type II diabetes mellitus    Urinary tract infection 11/17/2014   Weight loss 04/28/2022    Past Surgical History:  Procedure Laterality Date   CATARACT EXTRACTION     EYE SURGERY     PROSTATE BIOPSY     ROTATOR CUFF REPAIR     Right    Social History   Socioeconomic History   Marital status: Divorced    Spouse name: Not on file   Number of children: 3   Years of education: 12   Highest education level: High school graduate  Occupational History   Occupation: Retired  Tobacco Use   Smoking status: Former    Types: Cigarettes   Smokeless tobacco: Never  Substance and Sexual Activity   Alcohol use: No    Alcohol/week: 0.0 standard drinks of alcohol   Drug use: No   Sexual activity: Not Currently  Other Topics Concern   Not on file  Social History Narrative   Right handed   No caffeine   No falls   One story home   Social Drivers of Health   Financial Resource Strain: Low Risk  (07/13/2023)   Overall Financial Resource Strain (CARDIA)    Difficulty of Paying Living Expenses: Not hard at all  Food Insecurity: No Food Insecurity (03/28/2024)   Hunger Vital Sign    Worried About Running Out of Food in the Last Year: Never true    Ran Out of Food in the Last Year: Never true  Transportation Needs: No Transportation Needs (03/28/2024)   PRAPARE - Administrator, Civil Service (Medical): No    Lack of Transportation (Non-Medical): No  Physical Activity: Sufficiently Active (07/13/2023)   Exercise Vital Sign    Days of Exercise per Week: 3 days    Minutes of Exercise per Session: 50 min  Stress: No Stress Concern Present (07/13/2023)   [employer] of Occupational Health - Occupational Stress Questionnaire    Feeling of Stress : Not at all  Social Connections: Unknown (07/13/2023)   Social Connection and Isolation Panel    Frequency of Communication with Friends and Family: More than three times a week    Frequency of Social Gatherings with Friends and  Family: More than three times a week    Attends Religious Services: Patient unable to answer    Active Member of Clubs or Organizations: No    Attends Banker Meetings: Not on file    Marital Status: Divorced  Intimate Partner Violence: Not At Risk (03/28/2024)   Humiliation, Afraid, Rape, and Kick questionnaire    Fear of Current or Ex-Partner: No    Emotionally Abused: No    Physically Abused: No    Sexually Abused: No    Family History  Problem Relation Age of Onset   Dementia Cousin        maternal; x2     Review of Systems  Constitutional: Negative.  Negative for chills and fever.  HENT: Negative.  Negative for congestion and sore throat.   Respiratory: Negative.  Negative  for cough and shortness of breath.   Cardiovascular: Negative.  Negative for chest pain and palpitations.  Gastrointestinal:  Negative for abdominal pain, diarrhea, nausea and vomiting.  Genitourinary: Negative.  Negative for dysuria and hematuria.  Skin: Negative.  Negative for rash.  Neurological: Negative.  Negative for dizziness and headaches.    Vitals:   06/04/24 1309  BP: (!) 118/56  Pulse: 67  Temp: 98.7 F (37.1 C)  SpO2: 98%    Physical Exam Vitals reviewed.  Constitutional:      Appearance: Normal appearance.  HENT:     Head: Normocephalic.     Mouth/Throat:     Mouth: Mucous membranes are moist.     Pharynx: Oropharynx is clear.  Eyes:     Extraocular Movements: Extraocular movements intact.     Pupils: Pupils are equal, round, and reactive to light.  Cardiovascular:     Rate and Rhythm: Normal rate and regular rhythm.     Pulses: Normal pulses.     Heart sounds: Normal heart sounds.  Pulmonary:     Effort: Pulmonary effort is normal.     Breath sounds: Normal breath sounds.  Abdominal:     Palpations: Abdomen is soft.     Tenderness: There is no abdominal tenderness.  Musculoskeletal:     Cervical back: No tenderness.  Lymphadenopathy:     Cervical: No  cervical adenopathy.  Skin:    General: Skin is warm and dry.     Capillary Refill: Capillary refill takes less than 2 seconds.  Neurological:     General: No focal deficit present.     Mental Status: He is alert and oriented to person, place, and time.  Psychiatric:        Mood and Affect: Mood normal.        Behavior: Behavior normal.      ASSESSMENT & PLAN: A total of 44 minutes was spent with the patient and counseling/coordination of care regarding preparing for this visit, review of most recent office visit notes, review of multiple chronic medical conditions and their management, review of all medications, review of most recent bloodwork results, review of health maintenance items, education on nutrition, prognosis, documentation, and need for follow up.  Problem List Items Addressed This Visit       Cardiovascular and Mediastinum   Hypertension associated with diabetes - Primary   BP Readings from Last 3 Encounters:  06/04/24 (!) 118/56  04/11/24 (!) 104/54  04/05/24 (!) 90/50  Much improved blood pressure Was advised to stop amlodipine  last time and continue valsartan  160 mg daily Hemoglobin A1c 6.2 Cardiovascular risks associated with both conditions discussed Diet and nutrition discussed.  Encouraged to increase daily caloric intake Continue glimepiride  2 mg twice a day Recommend follow-up in 6 months         Endocrine   Dyslipidemia associated with type 2 diabetes mellitus   Chronic stable conditions Continue daily glimepiride  2 mg twice a day and atorvastatin  20 mg daily Diet and nutrition discussed        Nervous and Auditory   Dementia due to Alzheimer's disease   Chronic stable condition Sees neurologist on a regular basis Continue Aricept .  Namenda  was recently discontinued.        Genitourinary   BPH with obstruction/lower urinary tract symptoms   Asymptomatic. Continues Flomax  0.4 mg daily       Patient Instructions  Health Maintenance  After Age 23 After age 46, you are at a higher risk  for certain long-term diseases and infections as well as injuries from falls. Falls are a major cause of broken bones and head injuries in people who are older than age 11. Getting regular preventive care can help to keep you healthy and well. Preventive care includes getting regular testing and making lifestyle changes as recommended by your health care provider. Talk with your health care provider about: Which screenings and tests you should have. A screening is a test that checks for a disease when you have no symptoms. A diet and exercise plan that is right for you. What should I know about screenings and tests to prevent falls? Screening and testing are the best ways to find a health problem early. Early diagnosis and treatment give you the best chance of managing medical conditions that are common after age 49. Certain conditions and lifestyle choices may make you more likely to have a fall. Your health care provider may recommend: Regular vision checks. Poor vision and conditions such as cataracts can make you more likely to have a fall. If you wear glasses, make sure to get your prescription updated if your vision changes. Medicine review. Work with your health care provider to regularly review all of the medicines you are taking, including over-the-counter medicines. Ask your health care provider about any side effects that may make you more likely to have a fall. Tell your health care provider if any medicines that you take make you feel dizzy or sleepy. Strength and balance checks. Your health care provider may recommend certain tests to check your strength and balance while standing, walking, or changing positions. Foot health exam. Foot pain and numbness, as well as not wearing proper footwear, can make you more likely to have a fall. Screenings, including: Osteoporosis screening. Osteoporosis is a condition that causes the bones to get  weaker and break more easily. Blood pressure screening. Blood pressure changes and medicines to control blood pressure can make you feel dizzy. Depression screening. You may be more likely to have a fall if you have a fear of falling, feel depressed, or feel unable to do activities that you used to do. Alcohol use screening. Using too much alcohol can affect your balance and may make you more likely to have a fall. Follow these instructions at home: Lifestyle Do not drink alcohol if: Your health care provider tells you not to drink. If you drink alcohol: Limit how much you have to: 0-1 drink a day for women. 0-2 drinks a day for men. Know how much alcohol is in your drink. In the [place] one drink equals one 12 oz bottle of beer (355 mL), one 5 oz glass of wine (148 mL), or one 1 oz glass of hard liquor (44 mL). Do not use any products that contain nicotine or tobacco. These products include cigarettes, chewing tobacco, and vaping devices, such as e-cigarettes. If you need help quitting, ask your health care provider. Activity  Follow a regular exercise program to stay fit. This will help you maintain your balance. Ask your health care provider what types of exercise are appropriate for you. If you need a cane or walker, use it as recommended by your health care provider. Wear supportive shoes that have nonskid soles. Safety  Remove any tripping hazards, such as rugs, cords, and clutter. Install safety equipment such as grab bars in bathrooms and safety rails on stairs. Keep rooms and walkways well-lit. General instructions Talk with your health care provider about your risks for falling.  Tell your health care provider if: You fall. Be sure to tell your health care provider about all falls, even ones that seem minor. You feel dizzy, tiredness (fatigue), or off-balance. Take over-the-counter and prescription medicines only as told by your health care provider. These include  supplements. Eat a healthy diet and maintain a healthy weight. A healthy diet includes low-fat dairy products, low-fat (lean) meats, and fiber from whole grains, beans, and lots of fruits and vegetables. Stay current with your vaccines. Schedule regular health, dental, and eye exams. Summary Having a healthy lifestyle and getting preventive care can help to protect your health and wellness after age 69. Screening and testing are the best way to find a health problem early and help you avoid having a fall. Early diagnosis and treatment give you the best chance for managing medical conditions that are more common for people who are older than age 28. Falls are a major cause of broken bones and head injuries in people who are older than age 21. Take precautions to prevent a fall at home. Work with your health care provider to learn what changes you can make to improve your health and wellness and to prevent falls. This information is not intended to replace advice given to you by your health care provider. Make sure you discuss any questions you have with your health care provider. Document Revised: 02/23/2021 Document Reviewed: 02/23/2021 Elsevier Patient Education  2024 Elsevier Inc.    Emil Schaumann, MD [facility]

## 2024-06-04 NOTE — Assessment & Plan Note (Signed)
Chronic stable conditions Continue daily glimepiride 2 mg twice a day and atorvastatin 20 mg daily Diet and nutrition discussed

## 2024-06-04 NOTE — Patient Instructions (Signed)
 Health Maintenance After Age 83 After age 27, you are at a higher risk for certain long-term diseases and infections as well as injuries from falls. Falls are a major cause of broken bones and head injuries in people who are older than age 73. Getting regular preventive care can help to keep you healthy and well. Preventive care includes getting regular testing and making lifestyle changes as recommended by your health care provider. Talk with your health care provider about: Which screenings and tests you should have. A screening is a test that checks for a disease when you have no symptoms. A diet and exercise plan that is right for you. What should I know about screenings and tests to prevent falls? Screening and testing are the best ways to find a health problem early. Early diagnosis and treatment give you the best chance of managing medical conditions that are common after age 90. Certain conditions and lifestyle choices may make you more likely to have a fall. Your health care provider may recommend: Regular vision checks. Poor vision and conditions such as cataracts can make you more likely to have a fall. If you wear glasses, make sure to get your prescription updated if your vision changes. Medicine review. Work with your health care provider to regularly review all of the medicines you are taking, including over-the-counter medicines. Ask your health care provider about any side effects that may make you more likely to have a fall. Tell your health care provider if any medicines that you take make you feel dizzy or sleepy. Strength and balance checks. Your health care provider may recommend certain tests to check your strength and balance while standing, walking, or changing positions. Foot health exam. Foot pain and numbness, as well as not wearing proper footwear, can make you more likely to have a fall. Screenings, including: Osteoporosis screening. Osteoporosis is a condition that causes  the bones to get weaker and break more easily. Blood pressure screening. Blood pressure changes and medicines to control blood pressure can make you feel dizzy. Depression screening. You may be more likely to have a fall if you have a fear of falling, feel depressed, or feel unable to do activities that you used to do. Alcohol  use screening. Using too much alcohol  can affect your balance and may make you more likely to have a fall. Follow these instructions at home: Lifestyle Do not drink alcohol  if: Your health care provider tells you not to drink. If you drink alcohol : Limit how much you have to: 0-1 drink a day for women. 0-2 drinks a day for men. Know how much alcohol  is in your drink. In the [place] one drink equals one 12 oz bottle of beer (355 mL), one 5 oz glass of wine (148 mL), or one 1 oz glass of hard liquor (44 mL). Do not use any products that contain nicotine or tobacco. These products include cigarettes, chewing tobacco, and vaping devices, such as e-cigarettes. If you need help quitting, ask your health care provider. Activity  Follow a regular exercise program to stay fit. This will help you maintain your balance. Ask your health care provider what types of exercise are appropriate for you. If you need a cane or walker, use it as recommended by your health care provider. Wear supportive shoes that have nonskid soles. Safety  Remove any tripping hazards, such as rugs, cords, and clutter. Install safety equipment such as grab bars in bathrooms and safety rails on stairs. Keep rooms and walkways  well-lit. General instructions Talk with your health care provider about your risks for falling. Tell your health care provider if: You fall. Be sure to tell your health care provider about all falls, even ones that seem minor. You feel dizzy, tiredness (fatigue), or off-balance. Take over-the-counter and prescription medicines only as told by your health care provider. These include  supplements. Eat a healthy diet and maintain a healthy weight. A healthy diet includes low-fat dairy products, low-fat (lean) meats, and fiber from whole grains, beans, and lots of fruits and vegetables. Stay current with your vaccines. Schedule regular health, dental, and eye exams. Summary Having a healthy lifestyle and getting preventive care can help to protect your health and wellness after age 15. Screening and testing are the best way to find a health problem early and help you avoid having a fall. Early diagnosis and treatment give you the best chance for managing medical conditions that are more common for people who are older than age 42. Falls are a major cause of broken bones and head injuries in people who are older than age 64. Take precautions to prevent a fall at home. Work with your health care provider to learn what changes you can make to improve your health and wellness and to prevent falls. This information is not intended to replace advice given to you by your health care provider. Make sure you discuss any questions you have with your health care provider. Document Revised: 02/23/2021 Document Reviewed: 02/23/2021 Elsevier Patient Education  2024 [employer].

## 2024-06-04 NOTE — Assessment & Plan Note (Addendum)
 BP Readings from Last 3 Encounters:  06/04/24 (!) 118/56  04/11/24 (!) 104/54  04/05/24 (!) 90/50  Much improved blood pressure Was advised to stop amlodipine  last time and continue valsartan  160 mg daily Hemoglobin A1c 6.2 Cardiovascular risks associated with both conditions discussed Diet and nutrition discussed.  Encouraged to increase daily caloric intake Continue glimepiride  2 mg twice a day Recommend follow-up in 6 months

## 2024-06-05 ENCOUNTER — Ambulatory Visit (INDEPENDENT_AMBULATORY_CARE_PROVIDER_SITE_OTHER): Admitting: Podiatry

## 2024-06-05 ENCOUNTER — Other Ambulatory Visit: Payer: Self-pay | Admitting: Emergency Medicine

## 2024-06-05 ENCOUNTER — Encounter: Payer: Self-pay | Admitting: Podiatry

## 2024-06-05 DIAGNOSIS — E1142 Type 2 diabetes mellitus with diabetic polyneuropathy: Secondary | ICD-10-CM

## 2024-06-05 DIAGNOSIS — M79674 Pain in right toe(s): Secondary | ICD-10-CM

## 2024-06-05 DIAGNOSIS — F0280 Dementia in other diseases classified elsewhere without behavioral disturbance: Secondary | ICD-10-CM

## 2024-06-05 DIAGNOSIS — M79675 Pain in left toe(s): Secondary | ICD-10-CM

## 2024-06-05 DIAGNOSIS — B351 Tinea unguium: Secondary | ICD-10-CM

## 2024-06-05 MED ORDER — REXULTI 0.5 MG PO TABS: 0.5000 mg | ORAL_TABLET | Freq: Every day | ORAL | 3 refills | Status: DC

## 2024-06-05 NOTE — Telephone Encounter (Signed)
 Spoke with patients daughter and made her aware of new RX. She had questions regarding the medication, advise her to just keep an eye for any concerning changes and if needed to give the office a call or she can schedule and appointment for him to be seen again. She understood.

## 2024-06-05 NOTE — Telephone Encounter (Signed)
 Recommend Rexulti .  New prescription sent to pharmacy of record today.

## 2024-06-10 NOTE — Progress Notes (Addendum)
  Subjective:  Patient ID: Tony Price, male    DOB: 1941-05-18,  MRN: [id number]  83 y.o. male presents to clinic today with caregiver present preventative diabetic foot care. He is accompanied by his caregiver, Tony Price, on today's visit. Chief Complaint  Patient presents with   [facility]    Rm17 Diabetic foot care/ Dr. Purcell last visit August 18,2025/A1c 6.2   New pedal problem(s): None   PCP is Sagardia, Emil Schanz, MD.  No Known Allergies  Review of Systems: Negative except as noted in the HPI.   Objective:  Tony Price is a pleasant 83 y.o. male WD, WN in NAD.SABRA   Vascular Examination: Capillary refill time immediate b/l. Vascular status intact b/l with palpable pedal pulses. Pedal hair present b/l. No pain with calf compression b/l. Skin temperature gradient WNL b/l. No cyanosis or clubbing b/l. No ischemia or gangrene noted b/l. No edema noted b/l LE.  Neurological Examination: Sensation grossly intact b/l with 10 gram monofilament. Vibratory sensation intact b/l. Pt has subjective symptoms of neuropathy.  Dermatological Examination: Pedal skin with normal turgor, texture and tone b/l.  No open wounds. No interdigital macerations.   Toenails recently debrided.  No corns, calluses nor porokeratotic lesions noted.  Musculoskeletal Examination: Muscle strength 5/5 to all lower extremity muscle groups bilaterally. No pain, crepitus or joint limitation noted with ROM bilateral LE. HAV with bunion deformity noted b/l LE.  Radiographs: None  Last A1c:      Latest Ref Rng & Units 02/15/2024    1:50 PM 08/17/2023    2:06 PM  Hemoglobin A1C  Hemoglobin-A1c 4.0 - 5.6 % 6.2  6.2      Assessment:   1. Pain due to onychomycosis of toenails of both feet   2. Diabetic peripheral neuropathy associated with type 2 diabetes mellitus (HCC)    Plan:  -Patient with h/o dementia/Alzheimer's/cognitive deficit. Patient's family member present. All questions/concerns  addressed on today's visit. -Toenails noted to have adequate length and do not require debridement on today's visit. -Patient/POA to call should there be question/concern in the interim.  Return in about 4 months (around 10/05/2024).  Tony Price, DPM       LOCATION: [address] [place] [address]                   Office [phone]   [facility] LOCATION: [address] [place], [place] [address] Office [phone]

## 2024-06-15 NOTE — Telephone Encounter (Unsigned)
 Copied from CRM #[id number]. Topic: Clinical - Medication Question >> Jun 15, 2024  8:27 AM Turkey A wrote: Reason for CRM: Patient's daughter called and was concerned that patient has a Anxiety medication that was prescribed last week that is $1600. Patient's daughter is not sure of the name of medication. She would like something more cost efficient even though the medication was purchased. Please contact her [phone] Niklas Chretien

## 2024-06-16 ENCOUNTER — Other Ambulatory Visit: Payer: Self-pay | Admitting: Emergency Medicine

## 2024-06-16 MED ORDER — QUETIAPINE FUMARATE 50 MG PO TABS: 50.0000 mg | ORAL_TABLET | Freq: Every day | ORAL | 3 refills | Status: DC

## 2024-06-16 NOTE — Telephone Encounter (Signed)
 Prescription for Seroquel  sent today.

## 2024-06-18 DIAGNOSIS — F02B0 Dementia in other diseases classified elsewhere, moderate, without behavioral disturbance, psychotic disturbance, mood disturbance, and anxiety: Secondary | ICD-10-CM | POA: Diagnosis not present

## 2024-06-19 ENCOUNTER — Telehealth: Payer: Self-pay

## 2024-06-19 NOTE — Telephone Encounter (Signed)
 Copied from CRM #[id number]. Topic: General - Other >> Jun 19, 2024  8:41 AM Marissa P wrote: Reason for CRM: Patients daughter called would like a universe or cma to call her back in regards to the Seroquel , please today at [phone]

## 2024-06-20 NOTE — Telephone Encounter (Signed)
 Spoke with daughter and informed her that Seroquel  was the alternative of Rexulti . She stated they have not picked this up yet. Also Informed her if this new rx is also expensive to ask pharmacist what a cheaper alternative and to inform us . She understood and

## 2024-07-12 DIAGNOSIS — Z23 Encounter for immunization: Secondary | ICD-10-CM | POA: Diagnosis not present

## 2024-07-16 DIAGNOSIS — M62569 Muscle wasting and atrophy, not elsewhere classified, unspecified lower leg: Secondary | ICD-10-CM | POA: Diagnosis not present

## 2024-07-16 DIAGNOSIS — R2681 Unsteadiness on feet: Secondary | ICD-10-CM | POA: Diagnosis not present

## 2024-07-18 DIAGNOSIS — F02B0 Dementia in other diseases classified elsewhere, moderate, without behavioral disturbance, psychotic disturbance, mood disturbance, and anxiety: Secondary | ICD-10-CM | POA: Diagnosis not present

## 2024-07-19 DIAGNOSIS — R278 Other lack of coordination: Secondary | ICD-10-CM | POA: Diagnosis not present

## 2024-07-19 DIAGNOSIS — R488 Other symbolic dysfunctions: Secondary | ICD-10-CM | POA: Diagnosis not present

## 2024-07-20 DIAGNOSIS — R488 Other symbolic dysfunctions: Secondary | ICD-10-CM | POA: Diagnosis not present

## 2024-07-20 DIAGNOSIS — R278 Other lack of coordination: Secondary | ICD-10-CM | POA: Diagnosis not present

## 2024-07-25 DIAGNOSIS — R278 Other lack of coordination: Secondary | ICD-10-CM | POA: Diagnosis not present

## 2024-07-25 DIAGNOSIS — R488 Other symbolic dysfunctions: Secondary | ICD-10-CM | POA: Diagnosis not present

## 2024-07-26 DIAGNOSIS — R488 Other symbolic dysfunctions: Secondary | ICD-10-CM | POA: Diagnosis not present

## 2024-07-26 DIAGNOSIS — R278 Other lack of coordination: Secondary | ICD-10-CM | POA: Diagnosis not present

## 2024-07-31 DIAGNOSIS — R4185 Anosognosia: Secondary | ICD-10-CM | POA: Diagnosis not present

## 2024-07-31 DIAGNOSIS — R488 Other symbolic dysfunctions: Secondary | ICD-10-CM | POA: Diagnosis not present

## 2024-07-31 DIAGNOSIS — R278 Other lack of coordination: Secondary | ICD-10-CM | POA: Diagnosis not present

## 2024-07-31 DIAGNOSIS — R4789 Other speech disturbances: Secondary | ICD-10-CM | POA: Diagnosis not present

## 2024-08-01 ENCOUNTER — Other Ambulatory Visit: Payer: Self-pay | Admitting: Emergency Medicine

## 2024-08-01 ENCOUNTER — Other Ambulatory Visit: Payer: Self-pay | Admitting: Physician Assistant

## 2024-08-01 DIAGNOSIS — E11319 Type 2 diabetes mellitus with unspecified diabetic retinopathy without macular edema: Secondary | ICD-10-CM

## 2024-08-01 DIAGNOSIS — R4789 Other speech disturbances: Secondary | ICD-10-CM | POA: Diagnosis not present

## 2024-08-01 DIAGNOSIS — R488 Other symbolic dysfunctions: Secondary | ICD-10-CM | POA: Diagnosis not present

## 2024-08-01 DIAGNOSIS — R4185 Anosognosia: Secondary | ICD-10-CM | POA: Diagnosis not present

## 2024-08-03 DIAGNOSIS — R488 Other symbolic dysfunctions: Secondary | ICD-10-CM | POA: Diagnosis not present

## 2024-08-03 DIAGNOSIS — R278 Other lack of coordination: Secondary | ICD-10-CM | POA: Diagnosis not present

## 2024-08-08 DIAGNOSIS — R4789 Other speech disturbances: Secondary | ICD-10-CM | POA: Diagnosis not present

## 2024-08-08 DIAGNOSIS — R488 Other symbolic dysfunctions: Secondary | ICD-10-CM | POA: Diagnosis not present

## 2024-08-08 DIAGNOSIS — E119 Type 2 diabetes mellitus without complications: Secondary | ICD-10-CM | POA: Diagnosis not present

## 2024-08-08 DIAGNOSIS — H402231 Chronic angle-closure glaucoma, bilateral, mild stage: Secondary | ICD-10-CM | POA: Diagnosis not present

## 2024-08-08 DIAGNOSIS — H35371 Puckering of macula, right eye: Secondary | ICD-10-CM | POA: Diagnosis not present

## 2024-08-08 DIAGNOSIS — H35341 Macular cyst, hole, or pseudohole, right eye: Secondary | ICD-10-CM | POA: Diagnosis not present

## 2024-08-08 DIAGNOSIS — R4185 Anosognosia: Secondary | ICD-10-CM | POA: Diagnosis not present

## 2024-08-08 LAB — OPHTHALMOLOGY REPORT-SCANNED

## 2024-08-09 DIAGNOSIS — R488 Other symbolic dysfunctions: Secondary | ICD-10-CM | POA: Diagnosis not present

## 2024-08-09 DIAGNOSIS — R278 Other lack of coordination: Secondary | ICD-10-CM | POA: Diagnosis not present

## 2024-08-09 DIAGNOSIS — R4789 Other speech disturbances: Secondary | ICD-10-CM | POA: Diagnosis not present

## 2024-08-09 DIAGNOSIS — R4185 Anosognosia: Secondary | ICD-10-CM | POA: Diagnosis not present

## 2024-08-10 DIAGNOSIS — R488 Other symbolic dysfunctions: Secondary | ICD-10-CM | POA: Diagnosis not present

## 2024-08-10 DIAGNOSIS — R278 Other lack of coordination: Secondary | ICD-10-CM | POA: Diagnosis not present

## 2024-08-13 DIAGNOSIS — R488 Other symbolic dysfunctions: Secondary | ICD-10-CM | POA: Diagnosis not present

## 2024-08-13 DIAGNOSIS — R4185 Anosognosia: Secondary | ICD-10-CM | POA: Diagnosis not present

## 2024-08-13 DIAGNOSIS — R4789 Other speech disturbances: Secondary | ICD-10-CM | POA: Diagnosis not present

## 2024-08-14 DIAGNOSIS — R278 Other lack of coordination: Secondary | ICD-10-CM | POA: Diagnosis not present

## 2024-08-14 DIAGNOSIS — R488 Other symbolic dysfunctions: Secondary | ICD-10-CM | POA: Diagnosis not present

## 2024-08-15 ENCOUNTER — Other Ambulatory Visit: Payer: Self-pay | Admitting: Physician Assistant

## 2024-08-15 ENCOUNTER — Other Ambulatory Visit: Payer: Self-pay | Admitting: Emergency Medicine

## 2024-08-15 DIAGNOSIS — E785 Hyperlipidemia, unspecified: Secondary | ICD-10-CM

## 2024-08-15 DIAGNOSIS — R4185 Anosognosia: Secondary | ICD-10-CM | POA: Diagnosis not present

## 2024-08-15 DIAGNOSIS — R278 Other lack of coordination: Secondary | ICD-10-CM | POA: Diagnosis not present

## 2024-08-15 DIAGNOSIS — R488 Other symbolic dysfunctions: Secondary | ICD-10-CM | POA: Diagnosis not present

## 2024-08-15 DIAGNOSIS — R4789 Other speech disturbances: Secondary | ICD-10-CM | POA: Diagnosis not present

## 2024-08-16 ENCOUNTER — Ambulatory Visit (INDEPENDENT_AMBULATORY_CARE_PROVIDER_SITE_OTHER): Admitting: Emergency Medicine

## 2024-08-16 ENCOUNTER — Encounter: Payer: Self-pay | Admitting: Emergency Medicine

## 2024-08-16 ENCOUNTER — Ambulatory Visit (INDEPENDENT_AMBULATORY_CARE_PROVIDER_SITE_OTHER)

## 2024-08-16 VITALS — BP 124/80 | HR 63 | Temp 98.0°F | Ht 65.75 in | Wt 154.0 lb

## 2024-08-16 VITALS — BP 120/70 | HR 60 | Ht 65.75 in | Wt 155.0 lb

## 2024-08-16 DIAGNOSIS — Z7984 Long term (current) use of oral hypoglycemic drugs: Secondary | ICD-10-CM | POA: Diagnosis not present

## 2024-08-16 DIAGNOSIS — Z0000 Encounter for general adult medical examination without abnormal findings: Secondary | ICD-10-CM

## 2024-08-16 DIAGNOSIS — E1159 Type 2 diabetes mellitus with other circulatory complications: Secondary | ICD-10-CM | POA: Diagnosis not present

## 2024-08-16 DIAGNOSIS — F0280 Dementia in other diseases classified elsewhere without behavioral disturbance: Secondary | ICD-10-CM | POA: Diagnosis not present

## 2024-08-16 DIAGNOSIS — I152 Hypertension secondary to endocrine disorders: Secondary | ICD-10-CM

## 2024-08-16 DIAGNOSIS — E785 Hyperlipidemia, unspecified: Secondary | ICD-10-CM

## 2024-08-16 DIAGNOSIS — R488 Other symbolic dysfunctions: Secondary | ICD-10-CM | POA: Diagnosis not present

## 2024-08-16 DIAGNOSIS — R4789 Other speech disturbances: Secondary | ICD-10-CM | POA: Diagnosis not present

## 2024-08-16 DIAGNOSIS — G309 Alzheimer's disease, unspecified: Secondary | ICD-10-CM | POA: Diagnosis not present

## 2024-08-16 DIAGNOSIS — N138 Other obstructive and reflux uropathy: Secondary | ICD-10-CM | POA: Diagnosis not present

## 2024-08-16 DIAGNOSIS — E1169 Type 2 diabetes mellitus with other specified complication: Secondary | ICD-10-CM

## 2024-08-16 DIAGNOSIS — E1142 Type 2 diabetes mellitus with diabetic polyneuropathy: Secondary | ICD-10-CM

## 2024-08-16 DIAGNOSIS — N401 Enlarged prostate with lower urinary tract symptoms: Secondary | ICD-10-CM | POA: Diagnosis not present

## 2024-08-16 DIAGNOSIS — R4185 Anosognosia: Secondary | ICD-10-CM | POA: Diagnosis not present

## 2024-08-16 LAB — CBC WITH DIFFERENTIAL/PLATELET
Basophils Absolute: 0 K/uL (ref 0.0–0.1)
Basophils Relative: 0.7 % (ref 0.0–3.0)
Eosinophils Absolute: 0.1 K/uL (ref 0.0–0.7)
Eosinophils Relative: 1.1 % (ref 0.0–5.0)
HCT: 38.6 % — ABNORMAL LOW (ref 39.0–52.0)
Hemoglobin: 12.2 g/dL — ABNORMAL LOW (ref 13.0–17.0)
Lymphocytes Relative: 23.9 % (ref 12.0–46.0)
Lymphs Abs: 1.6 K/uL (ref 0.7–4.0)
MCHC: 31.6 g/dL (ref 30.0–36.0)
MCV: 85 fl (ref 78.0–100.0)
Monocytes Absolute: 0.4 K/uL (ref 0.1–1.0)
Monocytes Relative: 6.1 % (ref 3.0–12.0)
Neutro Abs: 4.7 K/uL (ref 1.4–7.7)
Neutrophils Relative %: 68.2 % (ref 43.0–77.0)
Platelets: 236 K/uL (ref 150.0–400.0)
RBC: 4.55 Mil/uL (ref 4.22–5.81)
RDW: 15.3 % (ref 11.5–15.5)
WBC: 6.8 K/uL (ref 4.0–10.5)

## 2024-08-16 LAB — COMPREHENSIVE METABOLIC PANEL WITH GFR
ALT: 14 U/L (ref 0–53)
AST: 23 U/L (ref 0–37)
Albumin: 4 g/dL (ref 3.5–5.2)
Alkaline Phosphatase: 89 U/L (ref 39–117)
BUN: 17 mg/dL (ref 6–23)
CO2: 30 meq/L (ref 19–32)
Calcium: 9.2 mg/dL (ref 8.4–10.5)
Chloride: 105 meq/L (ref 96–112)
Creatinine, Ser: 1.5 mg/dL (ref 0.40–1.50)
GFR: 42.98 mL/min — ABNORMAL LOW (ref >60.00)
Glucose, Bld: 78 mg/dL (ref 70–99)
Potassium: 5 meq/L (ref 3.5–5.1)
Sodium: 141 meq/L (ref 135–145)
Total Bilirubin: 0.4 mg/dL (ref 0.2–1.2)
Total Protein: 7.4 g/dL (ref 6.0–8.3)

## 2024-08-16 LAB — LIPID PANEL
Cholesterol: 197 mg/dL (ref 0–200)
HDL: 85.6 mg/dL (ref >39.00)
LDL Cholesterol: 94 mg/dL (ref 0–99)
NonHDL: 111.22
Total CHOL/HDL Ratio: 2
Triglycerides: 87 mg/dL (ref 0.0–149.0)
VLDL: 17.4 mg/dL (ref 0.0–40.0)

## 2024-08-16 LAB — MICROALBUMIN / CREATININE URINE RATIO
Creatinine,U: 126.4 mg/dL
Microalb Creat Ratio: UNDETERMINED mg/g (ref 0.0–30.0)
Microalb, Ur: <0.7 mg/dL

## 2024-08-16 NOTE — Patient Instructions (Signed)
 Health Maintenance After Age 83 After age 27, you are at a higher risk for certain long-term diseases and infections as well as injuries from falls. Falls are a major cause of broken bones and head injuries in people who are older than age 73. Getting regular preventive care can help to keep you healthy and well. Preventive care includes getting regular testing and making lifestyle changes as recommended by your health care provider. Talk with your health care provider about: Which screenings and tests you should have. A screening is a test that checks for a disease when you have no symptoms. A diet and exercise plan that is right for you. What should I know about screenings and tests to prevent falls? Screening and testing are the best ways to find a health problem early. Early diagnosis and treatment give you the best chance of managing medical conditions that are common after age 90. Certain conditions and lifestyle choices may make you more likely to have a fall. Your health care provider may recommend: Regular vision checks. Poor vision and conditions such as cataracts can make you more likely to have a fall. If you wear glasses, make sure to get your prescription updated if your vision changes. Medicine review. Work with your health care provider to regularly review all of the medicines you are taking, including over-the-counter medicines. Ask your health care provider about any side effects that may make you more likely to have a fall. Tell your health care provider if any medicines that you take make you feel dizzy or sleepy. Strength and balance checks. Your health care provider may recommend certain tests to check your strength and balance while standing, walking, or changing positions. Foot health exam. Foot pain and numbness, as well as not wearing proper footwear, can make you more likely to have a fall. Screenings, including: Osteoporosis screening. Osteoporosis is a condition that causes  the bones to get weaker and break more easily. Blood pressure screening. Blood pressure changes and medicines to control blood pressure can make you feel dizzy. Depression screening. You may be more likely to have a fall if you have a fear of falling, feel depressed, or feel unable to do activities that you used to do. Alcohol  use screening. Using too much alcohol  can affect your balance and may make you more likely to have a fall. Follow these instructions at home: Lifestyle Do not drink alcohol  if: Your health care provider tells you not to drink. If you drink alcohol : Limit how much you have to: 0-1 drink a day for women. 0-2 drinks a day for men. Know how much alcohol  is in your drink. In the [place] one drink equals one 12 oz bottle of beer (355 mL), one 5 oz glass of wine (148 mL), or one 1 oz glass of hard liquor (44 mL). Do not use any products that contain nicotine or tobacco. These products include cigarettes, chewing tobacco, and vaping devices, such as e-cigarettes. If you need help quitting, ask your health care provider. Activity  Follow a regular exercise program to stay fit. This will help you maintain your balance. Ask your health care provider what types of exercise are appropriate for you. If you need a cane or walker, use it as recommended by your health care provider. Wear supportive shoes that have nonskid soles. Safety  Remove any tripping hazards, such as rugs, cords, and clutter. Install safety equipment such as grab bars in bathrooms and safety rails on stairs. Keep rooms and walkways  well-lit. General instructions Talk with your health care provider about your risks for falling. Tell your health care provider if: You fall. Be sure to tell your health care provider about all falls, even ones that seem minor. You feel dizzy, tiredness (fatigue), or off-balance. Take over-the-counter and prescription medicines only as told by your health care provider. These include  supplements. Eat a healthy diet and maintain a healthy weight. A healthy diet includes low-fat dairy products, low-fat (lean) meats, and fiber from whole grains, beans, and lots of fruits and vegetables. Stay current with your vaccines. Schedule regular health, dental, and eye exams. Summary Having a healthy lifestyle and getting preventive care can help to protect your health and wellness after age 15. Screening and testing are the best way to find a health problem early and help you avoid having a fall. Early diagnosis and treatment give you the best chance for managing medical conditions that are more common for people who are older than age 42. Falls are a major cause of broken bones and head injuries in people who are older than age 64. Take precautions to prevent a fall at home. Work with your health care provider to learn what changes you can make to improve your health and wellness and to prevent falls. This information is not intended to replace advice given to you by your health care provider. Make sure you discuss any questions you have with your health care provider. Document Revised: 02/23/2021 Document Reviewed: 02/23/2021 Elsevier Patient Education  2024 [employer].

## 2024-08-16 NOTE — Assessment & Plan Note (Signed)
 Chronic stable condition Sees neurologist on a regular basis Continue Aricept .  Namenda  was recently discontinued.

## 2024-08-16 NOTE — Assessment & Plan Note (Signed)
Chronic stable conditions Continue daily glimepiride 2 mg twice a day and atorvastatin 20 mg daily Diet and nutrition discussed

## 2024-08-16 NOTE — Patient Instructions (Addendum)
 Mr. Nordquist,  Thank you for taking the time for your Medicare Wellness Visit. I appreciate your continued commitment to your health goals. Please review the care plan we discussed, and feel free to reach out if I can assist you further.  Medicare recommends these wellness visits once per year to help you and your care team stay ahead of potential health issues. These visits are designed to focus on prevention, allowing your provider to concentrate on managing your acute and chronic conditions during your regular appointments.  Please note that Annual Wellness Visits do not include a physical exam. Some assessments may be limited, especially if the visit was conducted virtually. If needed, we may recommend a separate in-person follow-up with your provider.  Ongoing Care Seeing your primary care provider every 3 to 6 months helps us  monitor your health and provide consistent, personalized care. Last office visit on 08/16/2024.  You are due for Shingles vaccine and can get that done at your pharmacy.  You are due for a kidney evaluation (urine sample) ans a A1C check, which will be done during your visit today with Dr. Purcell.  Referrals If a referral was made during today's visit and you haven't received any updates within two weeks, please contact the referred provider directly to check on the status.  Recommended Screenings:  Health Maintenance  Topic Date Due   Zoster (Shingles) Vaccine (1 of 2) Never done   Yearly kidney health urinalysis for diabetes  08/21/2021   COVID-19 Vaccine (4 - 2025-26 season) 06/18/2024   Medicare Annual Wellness Visit  07/12/2024   Hemoglobin A1C  08/16/2024   Complete foot exam   01/30/2025   Yearly kidney function blood test for diabetes  03/28/2025   Eye exam for diabetics  08/08/2025   Pneumococcal Vaccine for age over 41  Completed   Flu Shot  Completed   Meningitis B Vaccine  Aged Out   DTaP/Tdap/Td vaccine  Discontinued   Colon Cancer Screening   Discontinued   Hepatitis C Screening  Discontinued       04/05/2024    9:36 AM  Advanced Directives  Does Patient Have a Medical Advance Directive? No  Would patient like information on creating a medical advance directive? No - Patient declined   Advance Care Planning is important because it: Ensures you receive medical care that aligns with your values, goals, and preferences. Provides guidance to your family and loved ones, reducing the emotional burden of decision-making during critical moments.  Vision: Annual vision screenings are recommended for early detection of glaucoma, cataracts, and diabetic retinopathy. These exams can also reveal signs of chronic conditions such as diabetes and high blood pressure.  Dental: Annual dental screenings help detect early signs of oral cancer, gum disease, and other conditions linked to overall health, including heart disease and diabetes.  Please see the attached documents for additional preventive care recommendations.

## 2024-08-16 NOTE — Progress Notes (Signed)
 Tony Price 83 y.o.   Chief Complaint  Patient presents with   Follow-up    No concerns    HISTORY OF PRESENT ILLNESS: This is a 83 y.o. male here for follow-up of multiple chronic medical conditions including diabetes hypertension and dyslipidemia Overall doing well.  Accompanied by family today. Has no complaints or medical concerns today.  HPI   Prior to Admission medications   Medication Sig Start Date End Date Taking? Authorizing Provider  Ascorbic Acid (VITAMIN C) 500 MG CAPS Take 1 tablet by mouth at bedtime.   Yes [provider]  aspirin  81 MG EC tablet Take 81 mg by mouth at bedtime.   Yes [provider]  atorvastatin  (LIPITOR) 20 MG tablet TAKE 1 TABLET BY MOUTH EVERY DAY 08/15/24  Yes Carollee Nussbaumer, Emil Schanz, MD  carboxymethylcellulose (REFRESH PLUS) 0.5 % SOLN Place 1 drop into both eyes daily.   Yes [provider]  cetirizine  (ZYRTEC ) 10 MG tablet Take 10 mg by mouth at bedtime.   Yes [provider]  donepezil  (ARICEPT ) 10 MG tablet TAKE 1 TABLET BY MOUTH EVERY DAY 08/02/24  Yes Wertman, Sara E, PA-C  escitalopram  (LEXAPRO ) 5 MG tablet TAKE 1 TABLET BY MOUTH EVERY DAY 08/15/24  Yes Wertman, Sara E, PA-C  glimepiride  (AMARYL ) 2 MG tablet TAKE 1 TABLET BY MOUTH 2 TIMES DAILY 08/01/24  Yes Madylin Fairbank, Emil Schanz, MD  latanoprost  (XALATAN ) 0.005 % ophthalmic solution Place 1 drop into both eyes at bedtime. 08/31/19  Yes [provider]  QUEtiapine  (SEROQUEL ) 50 MG tablet Take 1 tablet (50 mg total) by mouth at bedtime. 06/16/24  Yes Neita Landrigan, Emil Schanz, MD  sulfamethoxazole -trimethoprim  (BACTRIM  DS) 800-160 MG tablet Take 1 tablet by mouth 2 (two) times daily. 03/28/24  Yes Arlon Carliss ORN, DO  tamsulosin  (FLOMAX ) 0.4 MG CAPS capsule TAKE 1 CAPSULE BY MOUTH EVERY DAY Patient taking differently: Take 0.4 mg by mouth at bedtime. 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   History of colonic polyps 11/04/2020   Diabetic retinopathy 03/31/2016   Dyslipidemia associated with type 2 diabetes mellitus 11/17/2014   Hypertension associated with diabetes 11/17/2014    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   Chest pain    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   Diabetic retinopathy 03/31/2016   Both eyes mild background diabetic retinopathy     Dyslipidemia associated with type 2 diabetes mellitus 11/17/2014   Erectile dysfunction due to arterial insufficiency 09/14/2021   History of colonic polyps 11/04/2020   Hypercholesterolemia    Hypertension associated with diabetes 11/17/2014   Hypotension due to hypovolemia 03/28/2024   Kidney stones    Post-inflammatory hyperpigmentation 03/30/2024   Postural dizziness with presyncope 03/28/2024   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   Type II diabetes mellitus    Urinary tract infection 11/17/2014   Weight loss 04/28/2022    Past Surgical History:  Procedure Laterality Date  CATARACT EXTRACTION     EYE SURGERY     PROSTATE BIOPSY     ROTATOR CUFF REPAIR     Right    Social History   Socioeconomic History   Marital status: Divorced    Spouse name: Not on file   Number of children: 3   Years of education: 12   Highest education level: High school graduate  Occupational History   Occupation: Retired  Tobacco Use   Smoking status: Former    Types: Cigarettes    Smokeless tobacco: Never  Vaping Use   Vaping status: Never Used  Substance and Sexual Activity   Alcohol use: No    Alcohol/week: 0.0 standard drinks of alcohol   Drug use: No   Sexual activity: Not Currently  Other Topics Concern   Not on file  Social History Narrative   Right handed   No caffeine   No falls   One story home    Lives alone/2025 has a caretaker around the clock   Social Drivers of Health   Financial Resource Strain: Low Risk  (08/16/2024)   Overall Financial Resource Strain (CARDIA)    Difficulty of Paying Living Expenses: Not hard at all  Food Insecurity: No Food Insecurity (08/16/2024)   Hunger Vital Sign    Worried About Running Out of Food in the Last Year: Never true    Ran Out of Food in the Last Year: Never true  Transportation Needs: No Transportation Needs (08/16/2024)   PRAPARE - Administrator, Civil Service (Medical): No    Lack of Transportation (Non-Medical): No  Physical Activity: Insufficiently Active (08/16/2024)   Exercise Vital Sign    Days of Exercise per Week: 2 days    Minutes of Exercise per Session: 30 min  Stress: No Stress Concern Present (08/16/2024)   [employer] of Occupational Health - Occupational Stress Questionnaire    Feeling of Stress: Not at all  Social Connections: Socially Isolated (08/16/2024)   Social Connection and Isolation Panel    Frequency of Communication with Friends and Family: More than three times a week    Frequency of Social Gatherings with Friends and Family: More than three times a week    Attends Religious Services: Never    Database Administrator or Organizations: No    Attends Banker Meetings: Never    Marital Status: Widowed  Intimate Partner Violence: Patient Unable To Answer (08/16/2024)   Humiliation, Afraid, Rape, and Kick questionnaire    Fear of Current or Ex-Partner: Patient unable to answer    Emotionally Abused: Patient unable to answer    Physically  Abused: Patient unable to answer    Sexually Abused: Patient unable to answer    Family History  Problem Relation Age of Onset   Dementia Cousin        maternal; x2     Review of Systems  Constitutional: Negative.  Negative for chills and fever.  HENT: Negative.  Negative for congestion and sore throat.   Respiratory: Negative.  Negative for cough and shortness of breath.   Cardiovascular: Negative.  Negative for chest pain and palpitations.  Gastrointestinal:  Negative for abdominal pain, diarrhea, nausea and vomiting.  Genitourinary: Negative.  Negative for dysuria and hematuria.  Skin: Negative.  Negative for rash.  Neurological: Negative.  Negative for dizziness and headaches.  All other systems reviewed and are negative.   Vitals:   08/16/24 1353  BP: 124/80  Pulse: 63  Temp: 98 F (36.7 C)  SpO2: 97%    Physical Exam Vitals reviewed.  Constitutional:      Appearance: Normal appearance.  HENT:     Head: Normocephalic.     Mouth/Throat:     Mouth: Mucous membranes are moist.     Pharynx: Oropharynx is clear.  Eyes:     Extraocular Movements: Extraocular movements intact.     Conjunctiva/sclera: Conjunctivae normal.     Pupils: Pupils are equal, round, and reactive to light.  Cardiovascular:     Rate and Rhythm: Normal rate and regular rhythm.     Pulses: Normal pulses.     Heart sounds: Normal heart sounds.  Pulmonary:     Effort: Pulmonary effort is normal.     Breath sounds: Normal breath sounds.  Abdominal:     Palpations: Abdomen is soft.     Tenderness: There is no abdominal tenderness.  Musculoskeletal:     Cervical back: No tenderness.  Lymphadenopathy:     Cervical: No cervical adenopathy.  Skin:    General: Skin is warm and dry.     Capillary Refill: Capillary refill takes less than 2 seconds.  Neurological:     General: No focal deficit present.     Mental Status: He is alert and oriented to person, place, and time.  Psychiatric:         Mood and Affect: Mood normal.        Behavior: Behavior normal.      ASSESSMENT & PLAN: A total of 42 minutes was spent with the patient and counseling/coordination of care regarding preparing for this visit, review of most recent office visit notes, review of multiple chronic medical conditions and their management, review of all medications, review of most recent bloodwork results, review of health maintenance items, education on nutrition, prognosis, documentation, and need for follow up.   Problem List Items Addressed This Visit       Cardiovascular and Mediastinum   Hypertension associated with diabetes - Primary   BP Readings from Last 3 Encounters:  08/16/24 124/80  08/16/24 120/70  06/04/24 (!) 118/56  Well-controlled hypertension Continue valsartan  160 mg daily Lab Results  Component Value Date   HGBA1C 6.2 (A) 02/15/2024  Well-controlled diabetes Continues glimepiride  2 mg twice a day Cardiovascular risks associated with hypertension and diabetes discussed Diet and nutrition discussed Recommend blood work today and follow-up in 6 months         Relevant Orders   CBC with Differential/Platelet   Comprehensive metabolic panel with GFR   Hemoglobin A1c   Lipid panel   Microalbumin / creatinine urine ratio     Endocrine   Dyslipidemia associated with type 2 diabetes mellitus   Chronic stable conditions Continue daily glimepiride  2 mg twice a day and atorvastatin  20 mg daily Diet and nutrition discussed      Relevant Orders   CBC with Differential/Platelet   Comprehensive metabolic panel with GFR   Hemoglobin A1c   Lipid panel   Microalbumin / creatinine urine ratio     Nervous and Auditory   Dementia due to Alzheimer's disease   Chronic stable condition Sees neurologist on a regular basis Continue Aricept .  Namenda  was recently discontinued        Genitourinary   BPH with obstruction/lower urinary tract symptoms   Asymptomatic. Continues Flomax   0.4 mg daily         Patient Instructions  Health Maintenance After Age 6 After age 22, you are at  a higher risk for certain long-term diseases and infections as well as injuries from falls. Falls are a major cause of broken bones and head injuries in people who are older than age 26. Getting regular preventive care can help to keep you healthy and well. Preventive care includes getting regular testing and making lifestyle changes as recommended by your health care provider. Talk with your health care provider about: Which screenings and tests you should have. A screening is a test that checks for a disease when you have no symptoms. A diet and exercise plan that is right for you. What should I know about screenings and tests to prevent falls? Screening and testing are the best ways to find a health problem early. Early diagnosis and treatment give you the best chance of managing medical conditions that are common after age 37. Certain conditions and lifestyle choices may make you more likely to have a fall. Your health care provider may recommend: Regular vision checks. Poor vision and conditions such as cataracts can make you more likely to have a fall. If you wear glasses, make sure to get your prescription updated if your vision changes. Medicine review. Work with your health care provider to regularly review all of the medicines you are taking, including over-the-counter medicines. Ask your health care provider about any side effects that may make you more likely to have a fall. Tell your health care provider if any medicines that you take make you feel dizzy or sleepy. Strength and balance checks. Your health care provider may recommend certain tests to check your strength and balance while standing, walking, or changing positions. Foot health exam. Foot pain and numbness, as well as not wearing proper footwear, can make you more likely to have a fall. Screenings, including: Osteoporosis  screening. Osteoporosis is a condition that causes the bones to get weaker and break more easily. Blood pressure screening. Blood pressure changes and medicines to control blood pressure can make you feel dizzy. Depression screening. You may be more likely to have a fall if you have a fear of falling, feel depressed, or feel unable to do activities that you used to do. Alcohol use screening. Using too much alcohol can affect your balance and may make you more likely to have a fall. Follow these instructions at home: Lifestyle Do not drink alcohol if: Your health care provider tells you not to drink. If you drink alcohol: Limit how much you have to: 0-1 drink a day for women. 0-2 drinks a day for men. Know how much alcohol is in your drink. In the [place] one drink equals one 12 oz bottle of beer (355 mL), one 5 oz glass of wine (148 mL), or one 1 oz glass of hard liquor (44 mL). Do not use any products that contain nicotine or tobacco. These products include cigarettes, chewing tobacco, and vaping devices, such as e-cigarettes. If you need help quitting, ask your health care provider. Activity  Follow a regular exercise program to stay fit. This will help you maintain your balance. Ask your health care provider what types of exercise are appropriate for you. If you need a cane or walker, use it as recommended by your health care provider. Wear supportive shoes that have nonskid soles. Safety  Remove any tripping hazards, such as rugs, cords, and clutter. Install safety equipment such as grab bars in bathrooms and safety rails on stairs. Keep rooms and walkways well-lit. General instructions Talk with your health care provider about your  risks for falling. Tell your health care provider if: You fall. Be sure to tell your health care provider about all falls, even ones that seem minor. You feel dizzy, tiredness (fatigue), or off-balance. Take over-the-counter and prescription medicines only  as told by your health care provider. These include supplements. Eat a healthy diet and maintain a healthy weight. A healthy diet includes low-fat dairy products, low-fat (lean) meats, and fiber from whole grains, beans, and lots of fruits and vegetables. Stay current with your vaccines. Schedule regular health, dental, and eye exams. Summary Having a healthy lifestyle and getting preventive care can help to protect your health and wellness after age 26. Screening and testing are the best way to find a health problem early and help you avoid having a fall. Early diagnosis and treatment give you the best chance for managing medical conditions that are more common for people who are older than age 63. Falls are a major cause of broken bones and head injuries in people who are older than age 40. Take precautions to prevent a fall at home. Work with your health care provider to learn what changes you can make to improve your health and wellness and to prevent falls. This information is not intended to replace advice given to you by your health care provider. Make sure you discuss any questions you have with your health care provider. Document Revised: 02/23/2021 Document Reviewed: 02/23/2021 Elsevier Patient Education  2024 [employer]     Emil Schaumann, MD [facility]

## 2024-08-16 NOTE — Progress Notes (Signed)
 Subjective:   Tony Price is a 83 y.o. who presents for a Medicare Wellness preventive visit.  As a reminder, Annual Wellness Visits don't include a physical exam, and some assessments may be limited, especially if this visit is performed virtually. We may recommend an in-person follow-up visit with your provider if needed.  Visit Complete: In person  Persons Participating in Visit: Daughter, grand daughter and caregiver and patient was present during visit.  AWV Questionnaire: No: Patient Medicare AWV questionnaire was not completed prior to this visit.  Cardiac Risk Factors include: advanced age (>64men, >64 women);diabetes mellitus;male gender;hypertension;dyslipidemia;Other (see comment), Risk factor comments: BPH     Objective:    Today's Vitals   08/16/24 1249  BP: 120/70  Pulse: 60  SpO2: 99%  Weight: 155 lb (70.3 kg)  Height: 5' 5.75 (1.67 m)   Body mass index is 25.21 kg/m.     08/16/2024    1:10 PM 04/05/2024    9:36 AM 03/28/2024   12:00 AM 03/27/2024    6:50 PM 12/17/2023    1:52 PM 09/28/2023    2:42 PM 07/13/2023    4:15 PM  Advanced Directives  Does Patient Have a Medical Advance Directive? Yes No No No Yes Yes Yes  Type of Estate Agent of [place];Living will    Living will Healthcare Power of [employer] Power of Attorney  Does patient want to make changes to medical advance directive?      No - Patient declined   Copy of Healthcare Power of Attorney in Chart? No - copy requested     No - copy requested No - copy requested  Would patient like information on creating a medical advance directive?  No - Patient declined No - Patient declined        Current Medications (verified) Outpatient Encounter Medications as of 08/16/2024  Medication Sig   Ascorbic Acid (VITAMIN C) 500 MG CAPS Take 1 tablet by mouth at bedtime.   aspirin  81 MG EC tablet Take 81 mg by mouth at bedtime.   atorvastatin  (LIPITOR) 20 MG tablet TAKE 1  TABLET BY MOUTH EVERY DAY   carboxymethylcellulose (REFRESH PLUS) 0.5 % SOLN Place 1 drop into both eyes daily.   cetirizine  (ZYRTEC ) 10 MG tablet Take 10 mg by mouth at bedtime.   donepezil  (ARICEPT ) 10 MG tablet TAKE 1 TABLET BY MOUTH EVERY DAY   escitalopram  (LEXAPRO ) 5 MG tablet TAKE 1 TABLET BY MOUTH EVERY DAY   glimepiride  (AMARYL ) 2 MG tablet TAKE 1 TABLET BY MOUTH 2 TIMES DAILY   latanoprost  (XALATAN ) 0.005 % ophthalmic solution Place 1 drop into both eyes at bedtime.   QUEtiapine  (SEROQUEL ) 50 MG tablet Take 1 tablet (50 mg total) by mouth at bedtime.   sulfamethoxazole -trimethoprim  (BACTRIM  DS) 800-160 MG tablet Take 1 tablet by mouth 2 (two) times daily.   tamsulosin  (FLOMAX ) 0.4 MG CAPS capsule TAKE 1 CAPSULE BY MOUTH EVERY DAY (Patient taking differently: Take 0.4 mg by mouth at bedtime.)   valsartan  (DIOVAN ) 160 MG tablet TAKE 1 TABLET BY MOUTH EVERY DAY   No facility-administered encounter medications on file as of 08/16/2024.    Allergies (verified) Patient has no known allergies.   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   Diabetic retinopathy 03/31/2016  Both eyes mild background diabetic retinopathy     Dyslipidemia associated with type 2 diabetes mellitus 11/17/2014   Erectile dysfunction due to arterial insufficiency 09/14/2021   History of colonic polyps 11/04/2020   Hypercholesterolemia    Hypertension associated with diabetes 11/17/2014   Hypotension due to hypovolemia 03/28/2024   Kidney stones    Post-inflammatory hyperpigmentation 03/30/2024   Postural dizziness with presyncope 03/28/2024   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   Type II diabetes mellitus    Urinary tract infection 11/17/2014   Weight loss 04/28/2022   Past Surgical History:   Procedure Laterality Date   CATARACT EXTRACTION     EYE SURGERY     PROSTATE BIOPSY     ROTATOR CUFF REPAIR     Right   Family History  Problem Relation Age of Onset   Dementia Cousin        maternal; x2   Social History   Socioeconomic History   Marital status: Divorced    Spouse name: Not on file   Number of children: 3   Years of education: 12   Highest education level: High school graduate  Occupational History   Occupation: Retired  Tobacco Use   Smoking status: Former    Types: Cigarettes   Smokeless tobacco: Never  Vaping Use   Vaping status: Never Used  Substance and Sexual Activity   Alcohol use: No    Alcohol/week: 0.0 standard drinks of alcohol   Drug use: No   Sexual activity: Not Currently  Other Topics Concern   Not on file  Social History Narrative   Right handed   No caffeine   No falls   One story home    Lives alone/2025 has a caretaker around the clock   Social Drivers of Health   Financial Resource Strain: Low Risk  (08/16/2024)   Overall Financial Resource Strain (CARDIA)    Difficulty of Paying Living Expenses: Not hard at all  Food Insecurity: No Food Insecurity (08/16/2024)   Hunger Vital Sign    Worried About Running Out of Food in the Last Year: Never true    Ran Out of Food in the Last Year: Never true  Transportation Needs: No Transportation Needs (08/16/2024)   PRAPARE - Administrator, Civil Service (Medical): No    Lack of Transportation (Non-Medical): No  Physical Activity: Insufficiently Active (08/16/2024)   Exercise Vital Sign    Days of Exercise per Week: 2 days    Minutes of Exercise per Session: 30 min  Stress: No Stress Concern Present (08/16/2024)   [employer] of Occupational Health - Occupational Stress Questionnaire    Feeling of Stress: Not at all  Social Connections: Socially Isolated (08/16/2024)   Social Connection and Isolation Panel    Frequency of Communication with Friends and Family:  More than three times a week    Frequency of Social Gatherings with Friends and Family: More than three times a week    Attends Religious Services: Never    Database Administrator or Organizations: No    Attends Banker Meetings: Never    Marital Status: Widowed    Tobacco Counseling Counseling given: Not Answered    Clinical Intake:  Pre-visit preparation completed: Yes  Pain : No/denies pain     BMI - recorded: 25.21 Nutritional Status: BMI 25 -29 Overweight Nutritional Risks: None Diabetes: Yes CBG done?: No Did pt. bring in CBG monitor from home?: No  Lab Results  Component Value Date  HGBA1C 6.2 (A) 02/15/2024   HGBA1C 6.2 (A) 08/17/2023   HGBA1C 6.1 (A) 10/20/2022     How often do you need to have someone help you when you read instructions, pamphlets, or other written materials from your doctor or pharmacy?: 1 - Never  Interpreter Needed?: No  Comments: Here with daughter/Denita Information entered by :: Karra Pink, RMA   Activities of Daily Living     08/16/2024   12:51 PM 03/28/2024   12:00 AM  In your present state of health, do you have any difficulty performing the following activities:  Hearing? 0 0  Vision? 0 0  Difficulty concentrating or making decisions? 0 1  Walking or climbing stairs? 0   Dressing or bathing? 0   Doing errands, shopping? 0 0  Comment family drives him around   Quarry Manager and eating ? N   Using the Toilet? N   In the past six months, have you accidently leaked urine? Y   Comment at night time/   Do you have problems with loss of bowel control? N   Managing your Medications? N   Managing your Finances? N   Housekeeping or managing your Housekeeping? N     Patient Care Team: Purcell Emil Schanz, MD as PCP - General (Internal Medicine) Alvia Norleen BIRCH, MD as Consulting Physician (Ophthalmology) Cleatus Collar, MD as Consulting Physician (Ophthalmology) Dina Sara E, PA-C (Neurology)  I  have updated your Care Teams any recent Medical Services you may have received from other providers in the past year.     Assessment:   This is a routine wellness examination for Reyce.  Hearing/Vision screen Hearing Screening - Comments:: Denies hearing difficulties   Vision Screening - Comments:: Denies vision issues./Patient is up to date    Goals Addressed   None    Depression Screen     08/16/2024    1:13 PM 06/04/2024    1:13 PM 04/11/2024    2:21 PM 02/15/2024    1:40 PM 07/13/2023    4:20 PM 10/20/2022    1:39 PM 07/29/2022   10:18 AM  PHQ 2/9 Scores  PHQ - 2 Score 1 0 0 0 0 0 0  PHQ- 9 Score 4    0      Fall Risk     08/16/2024    1:10 PM 06/04/2024    1:13 PM 04/11/2024    2:21 PM 04/05/2024    9:36 AM 04/04/2024    1:55 PM  Fall Risk   Falls in the past year? 0 0 0 0 0  Number falls in past yr: 0 0 0 0 0  Injury with Fall? 0 0 0 0 0  Risk for fall due to :  No Fall Risks No Fall Risks  No Fall Risks  Follow up Falls evaluation completed;Falls prevention discussed Falls evaluation completed Falls evaluation completed Falls evaluation completed Falls evaluation completed    MEDICARE RISK AT HOME:  Medicare Risk at Home Any stairs in or around the home?: No If so, are there any without handrails?: No Home free of loose throw rugs in walkways, pet beds, electrical cords, etc?: Yes Adequate lighting in your home to reduce risk of falls?: Yes Life alert?: No Use of a cane, walker or w/c?: No Grab bars in the bathroom?: Yes Shower chair or bench in shower?: Yes Elevated toilet seat or a handicapped toilet?: Yes  TIMED UP AND GO:  Was the test performed?  Yes  Length of  time to ambulate 10 feet: 15 sec Gait steady and fast without use of assistive device  Cognitive Function: 6CIT completed    09/28/2023    3:00 PM 03/29/2023    3:00 PM 08/24/2022    2:00 PM 06/17/2016    1:33 PM  MMSE - Mini Mental State Exam  Orientation to time 4 5 3 5    Orientation to  Place 4 4 5 5    Registration 3 3 3 3    Attention/ Calculation 5 5 5 5    Recall 0 0 0 2   Language- name 2 objects 2 2 2 2    Language- repeat 1 1 1 1   Language- follow 3 step command 3 3 3 3    Language- read & follow direction 1 1 1 1    Write a sentence 1 1 1 1    Copy design 0 1 0 1   Total score 24 26 24 29       Data saved with a previous flowsheet row definition      05/17/2022    3:00 PM  Montreal Cognitive Assessment   Attention: Read list of digits (0/2) 2  Attention: Read list of letters (0/1) 1  Attention: Serial 7 subtraction starting at 100 (0/3) 3  Language: Repeat phrase (0/2) 0  Language : Fluency (0/1) 0  Abstraction (0/2) 0  Delayed Recall (0/5) 0  Orientation (0/6) 6      08/16/2024    1:10 PM 07/13/2023    4:18 PM 07/07/2022   11:35 AM 07/05/2021    1:07 PM 06/14/2019    1:05 PM  6CIT Screen  What Year? 0 points 0 points 0 points 0 points 0 points  What month? 0 points 0 points 0 points 0 points 0 points  What time? 0 points 0 points 0 points 0 points 0 points  Count back from 20 0 points 0 points 0 points 0 points 0 points  Months in reverse 0 points 2 points 0 points 0 points 0 points  Repeat phrase  10 points 0 points 10 points 0 points  Total Score  12 points 0 points 10 points 0 points    Immunizations Immunization History  Administered Date(s) Administered   Fluad Quad(high Dose 65+) 07/06/2021, 07/29/2022   Influenza-Unspecified 07/12/2024   PFIZER(Purple Top)SARS-COV-2 Vaccination 12/03/2019, 12/29/2019, 08/02/2020   PNEUMOCOCCAL CONJUGATE-20 02/15/2024   Pneumococcal Polysaccharide-23 02/21/2020   Tdap 10/18/2010    Screening Tests Health Maintenance  Topic Date Due   Zoster Vaccines- Shingrix (1 of 2) Never done   Diabetic kidney evaluation - Urine ACR  08/21/2021   COVID-19 Vaccine (4 - 2025-26 season) 06/18/2024   HEMOGLOBIN A1C  08/16/2024   FOOT EXAM  01/30/2025   Diabetic kidney evaluation - eGFR measurement  03/28/2025    OPHTHALMOLOGY EXAM  08/08/2025   Medicare Annual Wellness (AWV)  08/16/2025   Pneumococcal Vaccine: 50+ Years  Completed   Influenza Vaccine  Completed   Meningococcal B Vaccine  Aged Out   DTaP/Tdap/Td  Discontinued   Colonoscopy  Discontinued   Hepatitis C Screening  Discontinued    Health Maintenance Items Addressed: See Nurse Notes at the end of this note  Additional Screening:  Vision Screening: Recommended annual ophthalmology exams for early detection of glaucoma and other disorders of the eye. Is the patient up to date with their annual eye exam?  Yes  Who is the provider or what is the name of the office in which the patient attends annual eye exams? [facility]/patient  is up to date  Dental Screening: Recommended annual dental exams for proper oral hygiene  Community Resource Referral / Chronic Care Management: CRR required this visit?  No   CCM required this visit?  No   Plan:    I have personally reviewed and noted the following in the patient's chart:   Medical and social history Use of alcohol, tobacco or illicit drugs  Current medications and supplements including opioid prescriptions. Patient is not currently taking opioid prescriptions. Functional ability and status Nutritional status Physical activity Advanced directives List of other physicians Hospitalizations, surgeries, and ER visits in previous 12 months Vitals Screenings to include cognitive, depression, and falls Referrals and appointments  In addition, I have reviewed and discussed with patient certain preventive protocols, quality metrics, and best practice recommendations. A written personalized care plan for preventive services as well as general preventive health recommendations were provided to patient.   Kelseigh Diver L Pedro Whiters, CMA   08/16/2024   After Visit Summary: (MyChart) Due to this being a telephonic visit, the after visit summary with patients personalized plan was offered to patient via  MyChart   Notes: Patient is due for a UACR and an A1C check, which can be done today during his office visit with provider.  Patient is also declines the Shingrix vaccine.

## 2024-08-16 NOTE — Assessment & Plan Note (Signed)
Asymptomatic.  Continues Flomax 0.4 mg daily

## 2024-08-16 NOTE — Assessment & Plan Note (Addendum)
 BP Readings from Last 3 Encounters:  08/16/24 124/80  08/16/24 120/70  06/04/24 (!) 118/56  Well-controlled hypertension Continue valsartan  160 mg daily Lab Results  Component Value Date   HGBA1C 6.2 (A) 02/15/2024  Well-controlled diabetes Continues glimepiride  2 mg twice a day Cardiovascular risks associated with hypertension and diabetes discussed Diet and nutrition discussed Recommend blood work today and follow-up in 6 months

## 2024-08-17 ENCOUNTER — Ambulatory Visit: Payer: Self-pay | Admitting: Emergency Medicine

## 2024-08-17 ENCOUNTER — Ambulatory Visit

## 2024-08-17 LAB — HEMOGLOBIN A1C: Hgb A1c MFr Bld: 6.5 % (ref 4.6–6.5)

## 2024-08-18 DIAGNOSIS — F02B0 Dementia in other diseases classified elsewhere, moderate, without behavioral disturbance, psychotic disturbance, mood disturbance, and anxiety: Secondary | ICD-10-CM | POA: Diagnosis not present

## 2024-08-20 DIAGNOSIS — R4789 Other speech disturbances: Secondary | ICD-10-CM | POA: Diagnosis not present

## 2024-08-20 DIAGNOSIS — R488 Other symbolic dysfunctions: Secondary | ICD-10-CM | POA: Diagnosis not present

## 2024-08-20 DIAGNOSIS — R4185 Anosognosia: Secondary | ICD-10-CM | POA: Diagnosis not present

## 2024-08-21 DIAGNOSIS — R278 Other lack of coordination: Secondary | ICD-10-CM | POA: Diagnosis not present

## 2024-08-21 DIAGNOSIS — R488 Other symbolic dysfunctions: Secondary | ICD-10-CM | POA: Diagnosis not present

## 2024-08-22 DIAGNOSIS — R4185 Anosognosia: Secondary | ICD-10-CM | POA: Diagnosis not present

## 2024-08-22 DIAGNOSIS — R488 Other symbolic dysfunctions: Secondary | ICD-10-CM | POA: Diagnosis not present

## 2024-08-22 DIAGNOSIS — R4789 Other speech disturbances: Secondary | ICD-10-CM | POA: Diagnosis not present

## 2024-08-23 DIAGNOSIS — R278 Other lack of coordination: Secondary | ICD-10-CM | POA: Diagnosis not present

## 2024-08-23 DIAGNOSIS — R488 Other symbolic dysfunctions: Secondary | ICD-10-CM | POA: Diagnosis not present

## 2024-08-23 DIAGNOSIS — R4185 Anosognosia: Secondary | ICD-10-CM | POA: Diagnosis not present

## 2024-08-23 DIAGNOSIS — R4789 Other speech disturbances: Secondary | ICD-10-CM | POA: Diagnosis not present

## 2024-08-27 DIAGNOSIS — R4185 Anosognosia: Secondary | ICD-10-CM | POA: Diagnosis not present

## 2024-08-27 DIAGNOSIS — R488 Other symbolic dysfunctions: Secondary | ICD-10-CM | POA: Diagnosis not present

## 2024-08-27 DIAGNOSIS — R4789 Other speech disturbances: Secondary | ICD-10-CM | POA: Diagnosis not present

## 2024-08-28 DIAGNOSIS — R4789 Other speech disturbances: Secondary | ICD-10-CM | POA: Diagnosis not present

## 2024-08-28 DIAGNOSIS — R488 Other symbolic dysfunctions: Secondary | ICD-10-CM | POA: Diagnosis not present

## 2024-08-28 DIAGNOSIS — R278 Other lack of coordination: Secondary | ICD-10-CM | POA: Diagnosis not present

## 2024-08-28 DIAGNOSIS — R4185 Anosognosia: Secondary | ICD-10-CM | POA: Diagnosis not present

## 2024-08-29 DIAGNOSIS — R488 Other symbolic dysfunctions: Secondary | ICD-10-CM | POA: Diagnosis not present

## 2024-08-29 DIAGNOSIS — R4789 Other speech disturbances: Secondary | ICD-10-CM | POA: Diagnosis not present

## 2024-08-29 DIAGNOSIS — R4185 Anosognosia: Secondary | ICD-10-CM | POA: Diagnosis not present

## 2024-08-30 ENCOUNTER — Telehealth: Payer: Self-pay | Admitting: Physician Assistant

## 2024-08-30 DIAGNOSIS — R4789 Other speech disturbances: Secondary | ICD-10-CM | POA: Diagnosis not present

## 2024-08-30 DIAGNOSIS — R278 Other lack of coordination: Secondary | ICD-10-CM | POA: Diagnosis not present

## 2024-08-30 DIAGNOSIS — R488 Other symbolic dysfunctions: Secondary | ICD-10-CM | POA: Diagnosis not present

## 2024-08-30 DIAGNOSIS — R4185 Anosognosia: Secondary | ICD-10-CM | POA: Diagnosis not present

## 2024-08-30 NOTE — Telephone Encounter (Signed)
 Going to see patient and update on appt per Camie, they were fine with that.

## 2024-08-30 NOTE — Telephone Encounter (Signed)
 Pt looking for increase of Rx

## 2024-08-31 DIAGNOSIS — R4185 Anosognosia: Secondary | ICD-10-CM | POA: Diagnosis not present

## 2024-08-31 DIAGNOSIS — R488 Other symbolic dysfunctions: Secondary | ICD-10-CM | POA: Diagnosis not present

## 2024-08-31 DIAGNOSIS — R4789 Other speech disturbances: Secondary | ICD-10-CM | POA: Diagnosis not present

## 2024-09-03 ENCOUNTER — Ambulatory Visit: Payer: Self-pay

## 2024-09-03 ENCOUNTER — Institutional Professional Consult (permissible substitution): Payer: Medicare Other | Admitting: Psychology

## 2024-09-03 DIAGNOSIS — R488 Other symbolic dysfunctions: Secondary | ICD-10-CM | POA: Diagnosis not present

## 2024-09-03 DIAGNOSIS — R4789 Other speech disturbances: Secondary | ICD-10-CM | POA: Diagnosis not present

## 2024-09-03 DIAGNOSIS — R4185 Anosognosia: Secondary | ICD-10-CM | POA: Diagnosis not present

## 2024-09-04 DIAGNOSIS — R278 Other lack of coordination: Secondary | ICD-10-CM | POA: Diagnosis not present

## 2024-09-04 DIAGNOSIS — R488 Other symbolic dysfunctions: Secondary | ICD-10-CM | POA: Diagnosis not present

## 2024-09-05 ENCOUNTER — Ambulatory Visit: Admitting: Podiatry

## 2024-09-05 ENCOUNTER — Encounter: Payer: Self-pay | Admitting: Podiatry

## 2024-09-05 DIAGNOSIS — M79675 Pain in left toe(s): Secondary | ICD-10-CM | POA: Diagnosis not present

## 2024-09-05 DIAGNOSIS — E1142 Type 2 diabetes mellitus with diabetic polyneuropathy: Secondary | ICD-10-CM | POA: Diagnosis not present

## 2024-09-05 DIAGNOSIS — M79674 Pain in right toe(s): Secondary | ICD-10-CM | POA: Diagnosis not present

## 2024-09-05 DIAGNOSIS — B351 Tinea unguium: Secondary | ICD-10-CM | POA: Diagnosis not present

## 2024-09-06 DIAGNOSIS — R278 Other lack of coordination: Secondary | ICD-10-CM | POA: Diagnosis not present

## 2024-09-06 DIAGNOSIS — R488 Other symbolic dysfunctions: Secondary | ICD-10-CM | POA: Diagnosis not present

## 2024-09-09 DIAGNOSIS — R488 Other symbolic dysfunctions: Secondary | ICD-10-CM | POA: Diagnosis not present

## 2024-09-09 DIAGNOSIS — R278 Other lack of coordination: Secondary | ICD-10-CM | POA: Diagnosis not present

## 2024-09-10 ENCOUNTER — Encounter (INDEPENDENT_AMBULATORY_CARE_PROVIDER_SITE_OTHER): Payer: Medicare Other | Admitting: Ophthalmology

## 2024-09-10 ENCOUNTER — Encounter: Payer: Medicare Other | Admitting: Psychology

## 2024-09-10 DIAGNOSIS — R4185 Anosognosia: Secondary | ICD-10-CM | POA: Diagnosis not present

## 2024-09-10 DIAGNOSIS — R278 Other lack of coordination: Secondary | ICD-10-CM | POA: Diagnosis not present

## 2024-09-10 DIAGNOSIS — R4789 Other speech disturbances: Secondary | ICD-10-CM | POA: Diagnosis not present

## 2024-09-10 DIAGNOSIS — R488 Other symbolic dysfunctions: Secondary | ICD-10-CM | POA: Diagnosis not present

## 2024-09-14 ENCOUNTER — Encounter: Payer: Self-pay | Admitting: Podiatry

## 2024-09-14 DIAGNOSIS — R4185 Anosognosia: Secondary | ICD-10-CM | POA: Diagnosis not present

## 2024-09-14 DIAGNOSIS — R488 Other symbolic dysfunctions: Secondary | ICD-10-CM | POA: Diagnosis not present

## 2024-09-14 DIAGNOSIS — R4789 Other speech disturbances: Secondary | ICD-10-CM | POA: Diagnosis not present

## 2024-09-14 NOTE — Progress Notes (Signed)
  Subjective:  Patient ID: Tony Price, male    DOB: [birth year],  MRN: [id number]  Renelda LITTIE Glatter presents to clinic today for at risk foot care with history of diabetic neuropathy and painful mycotic toenails of both feet that are difficult to trim. Pain interferes with daily activities and wearing enclosed shoe gear comfortably. He has h/o dementia and is accompanied by his caregiver on today's visit. Chief Complaint  Patient presents with   Diabetes    [facility] NIDDM A1C 6.5 Toenail trim. LOV with PCP 08/16/24.   New problem(s): None.   PCP is Sagardia, Emil Schanz, MD.  No Known Allergies  Review of Systems: Negative except as noted in the HPI.  Objective: No changes noted in today's physical examination. There were no vitals filed for this visit. Tony Price is a pleasant 83 y.o. male WD, WN in NAD. AAO x 3. Vascular Examination: Capillary refill time immediate b/l. Vascular status intact b/l with palpable pedal pulses. Pedal hair present b/l. No pain with calf compression b/l. Skin temperature gradient WNL b/l. No cyanosis or clubbing b/l. No ischemia or gangrene noted b/l. No edema noted b/l LE.  Neurological Examination: Sensation grossly intact b/l with 10 gram monofilament. Vibratory sensation intact b/l. Pt has subjective symptoms of neuropathy.  Dermatological Examination: Pedal skin with normal turgor, texture and tone b/l.  No open wounds. No interdigital macerations.   Toenails recently debrided.  No corns, calluses nor porokeratotic lesions noted.  Musculoskeletal Examination: Muscle strength 5/5 to all lower extremity muscle groups bilaterally. No pain, crepitus or joint limitation noted with ROM bilateral LE. HAV with bunion deformity noted b/l LE.  Radiographs: None  Assessment/Plan: 1. Pain due to onychomycosis of toenails of both feet   2. Diabetic peripheral neuropathy associated with type 2 diabetes mellitus (HCC)   -Caregiver/provider present  with patient on today's visit. -Patient with h/o dementia/Alzheimer's/cognitive deficit. Patient's family member present. All questions/concerns addressed on today's visit. -Patient to continue soft, supportive shoe gear daily. -Toenails 1-5 b/l were debrided in length and girth with sterile nail nippers and dremel without iatrogenic bleeding. Treatment was provided by assistant Andrez Manchester under my supervision.  -Patient/POA to call should there be question/concern in the interim.   No follow-ups on file.  Delon LITTIE Merlin, DPM      [place] LOCATION: [address] [place] [address]                   Office [phone]   [facility] LOCATION: [address] [place], [place] [address] Office [phone]

## 2024-09-18 ENCOUNTER — Telehealth: Payer: Self-pay | Admitting: Physician Assistant

## 2024-09-18 NOTE — Telephone Encounter (Signed)
 Tony Price is the daughter, patient is already scheduled for follow up to discuss medical issues.

## 2024-09-18 NOTE — Telephone Encounter (Signed)
 Left message with the after hour service on  09-18-24 at 12:04 pm   Caller states patient is having a lot of stress and would like to get the advise of the provider.   Etta Ruth is not on the [facility]. The DPR only shows  Denita or [place] and we are to ask for a code before speaking with anyone. The code Is noted on the comment line on the demographics

## 2024-09-19 ENCOUNTER — Other Ambulatory Visit: Payer: Self-pay | Admitting: Emergency Medicine

## 2024-09-21 ENCOUNTER — Other Ambulatory Visit: Payer: Self-pay | Admitting: Emergency Medicine

## 2024-09-21 DIAGNOSIS — F0280 Dementia in other diseases classified elsewhere without behavioral disturbance: Secondary | ICD-10-CM

## 2024-09-24 ENCOUNTER — Other Ambulatory Visit: Payer: Self-pay | Admitting: Physician Assistant

## 2024-09-24 ENCOUNTER — Other Ambulatory Visit: Payer: Self-pay | Admitting: Emergency Medicine

## 2024-09-24 DIAGNOSIS — F0280 Dementia in other diseases classified elsewhere without behavioral disturbance: Secondary | ICD-10-CM

## 2024-10-03 ENCOUNTER — Telehealth: Payer: Self-pay

## 2024-10-03 NOTE — Telephone Encounter (Signed)
 Copied from CRM #[id number]. Topic: Clinical - Prescription Issue >> Oct 03, 2024 11:22 AM Alexandria E wrote: Reason for CRM: Danielle with [facility] called in stating that she tried faxing over a refill request for patient's Rexulti  and it came back denied without giving a reason. Danielle would like to know if this medication has been stopped or what the reason is.

## 2024-10-03 NOTE — Telephone Encounter (Signed)
 Do not know the answer to that.  We need to start by finding out who started Rexulti  medication.  I think it was started by neurologist office.  Pharmacy needs to reach out to them then.

## 2024-10-03 NOTE — Telephone Encounter (Signed)
 Do not see this medication on his list. So I am assuming he is not longer taking this medication

## 2024-10-05 ENCOUNTER — Encounter: Payer: Self-pay | Admitting: Emergency Medicine

## 2024-10-05 ENCOUNTER — Other Ambulatory Visit: Payer: Self-pay | Admitting: Emergency Medicine

## 2024-10-05 DIAGNOSIS — F0280 Dementia in other diseases classified elsewhere without behavioral disturbance: Secondary | ICD-10-CM

## 2024-10-06 ENCOUNTER — Other Ambulatory Visit: Payer: Self-pay | Admitting: Emergency Medicine

## 2024-10-06 DIAGNOSIS — F0280 Dementia in other diseases classified elsewhere without behavioral disturbance: Secondary | ICD-10-CM

## 2024-10-08 NOTE — Progress Notes (Signed)
 "     Tony Price is a very pleasant 83 y.o. RH male with a history of hypertension, hyperlipidemia, DM2, diabetic retinopathy, recent hospitalization for hypotension due to dehydration and UTI, and a diagnosis of dementia due to Alzheimer's disease per neuropsych evaluation on June 2025 seen today in follow up for memory loss. Patient is currently on memantine  10 mg twice daily and donepezil  10 mg daily***.  This patient is accompanied in the office by his son and patient's wife*** who supplements the history.  Previous records as well as any outside records available were reviewed prior to todays visit. Patient was last seen on 04/05/2024***. Memory is **. MMSE today is  /30. Patient is able to participate on ADLs and continues to drive minimally without difficulties. 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  History of Present Illness     Any changes in memory since last visit? Worse than before, especially over the last couple of months.  He has more difficulty with recent conversations, as well as names.  He likes going to [place], watching TV, does not like doing any brain stimulating exercises or social activities.. repeats oneself?  Endorsed, frequently. Disoriented when walking into a room?  Patient denies.    Misplacing objects?  Patient denies   Wandering behavior?   Denies. Any personality changes since last visit? Denies.   Any worsening depression?: Denies  Hallucinations or paranoia?  Denies.   Seizures?   Denies.    Any sleep changes? Sleeps well  Denies vivid dreams, he does talk in his sleep. Denies sleepwalking Sleep apnea?   denies    Any hygiene concerns?   Denies.   Independent of bathing and dressing?  Endorsed  Does the patient needs help with medications? Son  is in charge   Who is in charge of  the finances?Son is in charge     Any changes in appetite?  denies.  He eats well. Does not drink enough water.    Patient have trouble swallowing?  Denies.   Does the patient cook?  Yes , breakfast.  Any kitchen accidents such as leaving the stove on?   Denies.   Any headaches?    Denies.   Vision changes? Denies. Chronic pain?  Denies.   Ambulates with difficulty?  Denies, but slowed down a little bit.    Recent falls or head injuries?    Denies.      Unilateral weakness, numbness or tingling?  Denies.   Any tremors?  Denies.   Any anosmia?    Denies.   Any incontinence of urine?  He had recent acute cystitis requiring visit to the ED.SABRA Any bowel dysfunction?  Denies.      Patient lives with his wife  Does the patient drive?  Wife drives most of the time.      Initial visit 05/17/22 How long did patient have memory difficulties?  About 2 years, short-term memory is worse over the last 2 months.  If I telling him going to do something, he will ask me again  Patient lives with: His daughter and granddaughter, according to the wife, they bring significant amount of stress, which wife feels that exacerbate his memory difficulties.   Repeats oneself?  Endorsed, he asked the same questions and tells the same stories. Disoriented when walking into  a room?  Patient denies   Leaving objects in unusual places?  Patient denies   Ambulates  with difficulty?   Patient denies   Recent falls?  Patient denies   Any head injuries?  Patient denies   History of seizures?   Patient denies   Wandering behavior?  Patient denies   Patient drives?  No issues, may need GPS even when it was a familiar location  Any mood changes? More frustrated than before  Any history of depression?:  Patient denies   Hallucinations?  Patient denies   Paranoia?  Patient denies   Patient reports that he sleeps well without vivid dreams, REM behavior or sleepwalking   History of sleep apnea?  Patient denies   Any  hygiene concerns?  Patient denies   Independent of bathing and dressing?  Endorsed  Does the patient needs help with medications?  Patient is in charge Who is in charge of the finances?  Patient is in charge    Patient have trouble swallowing? Patient denies   Does the patient cook?  Patient denies   Any kitchen accidents such as leaving the stove on? Patient denies   Any headaches?  Patient denies   Double vision? Patient denies   Any focal numbness or tingling?  Patient denies   Chronic back pain Patient denies   Unilateral weakness?  Patient denies   Any tremors?  Patient denies   Any history of anosmia?  Patient denies   Any incontinence of urine?  Patient denies   Any bowel dysfunction?   Patient denies      History of heavy alcohol intake?  Patient denies   History of heavy tobacco use?  Patient denies   Family history of dementia?  Mother  and a first cousin had Alzheimer's disease         Neuropsych evaluation 03/2024 Tony Price Briefly, results suggested severe impairment surrounding all aspects of learning and memory. An isolated impairment across receptive language was likely caused by memory impairment. Additional weaknesses were exhibited across complex attention, executive functioning, and semantic fluency. Performance variability was exhibited across processing speed, confrontation naming, and visuospatial abilities. Relative to his previous evaluation in April 2024, decline was exhibited across attention/concentration, receptive language, and aspects of memory, particularly those surrounding recognition/consolidation. Outside of this, other assessed domains exhibited relative stability. Regarding the cause for memory impairment and progressive decline over time, primary concerns continue to surround underlying Alzheimer's disease. Across memory testing, Tony Price did not benefit from repeated exposure to information across learning trials, was fully amnestic (i.e., 0% retention)  across all memory tasks after a brief delay, and performed poorly across yes/no recognition trials. 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 Impairments and/or weakness surrounding semantic fluency and confrontation naming would follow typical disease progression.       MRI brain 05/30/22 personally reviewed was remarkable for mild age related volume loss and moderate chronic small-vessel ischemic changes of the pons, thalami and hemispheric white matter         09/28/2023    3:00 PM 03/29/2023    3:00 PM 08/24/2022    2:00 PM  MMSE - Mini Mental State Exam  Orientation to time 4 5 3   Orientation to Place 4 4 5   Registration 3 3 3   Attention/ Calculation 5 5 5   Recall 0 0 0  Language- name 2 objects 2 2 2  Language- repeat 1 1 1   Language- follow 3 step command 3 3 3   Language- read & follow direction 1 1 1   Write a sentence 1 1 1   Copy design 0 1 0  Total score 24 26 24       05/17/2022    3:00 PM  Montreal Cognitive Assessment   Attention: Read list of digits (0/2) 2  Attention: Read list of letters (0/1) 1  Attention: Serial 7 subtraction starting at 100 (0/3) 3  Language: Repeat phrase (0/2) 0  Language : Fluency (0/1) 0  Abstraction (0/2) 0  Delayed Recall (0/5) 0  Orientation (0/6) 6      Objective:    Neurological Exam:    VITALS:  There were no vitals filed for this visit.  GEN:  The patient appears stated age and is in NAD. HEENT:  Normocephalic, atraumatic.   Neurological examination:  General: NAD, well-groomed, appears stated age. Orientation: The patient is alert. Oriented to person, place and not to date Cranial nerves: There is good facial symmetry.The speech is fluent and clear. No aphasia or dysarthria. Fund of knowledge is appropriate. Recent and remote memory are impaired. Attention and concentration are reduced. Able to name objects and  repeat phrases.  Hearing is decreased to conversational tone. *** Sensation: Sensation is intact to light touch throughout Motor: Strength is at least antigravity x4. DTR's 2/4 in UE/LE     Movement examination:  Tone: There is normal tone in the UE/LE Abnormal movements:  no tremor.  No myoclonus.  No asterixis.   Coordination:  There is no decremation with RAM's. Normal finger to nose  Gait and Station: The patient has no*** difficulty arising out of a deep-seated chair without the use of the hands. The patient's stride length is good.  Gait is cautious and narrow.    Thank you for allowing us  the opportunity to participate in the care of this nice patient. Please do not hesitate to contact us  for any questions or concerns.   Total time spent on today's visit was *** minutes dedicated to this patient today, preparing to see patient, examining the patient, ordering tests and/or medications and counseling the patient, documenting clinical information in the EHR or other health record, independently interpreting results and communicating results to the patient/family, discussing treatment and goals, answering patient's questions and coordinating care.  Cc:  Purcell Emil Schanz, MD  Camie Sevin 10/08/2024 5:29 AM      "

## 2024-10-09 ENCOUNTER — Encounter: Payer: Self-pay | Admitting: Physician Assistant

## 2024-10-09 ENCOUNTER — Ambulatory Visit: Admitting: Physician Assistant

## 2024-10-09 VITALS — BP 137/83 | HR 73 | Resp 18 | Ht 65.75 in

## 2024-10-09 DIAGNOSIS — G309 Alzheimer's disease, unspecified: Secondary | ICD-10-CM | POA: Diagnosis not present

## 2024-10-09 DIAGNOSIS — F0280 Dementia in other diseases classified elsewhere without behavioral disturbance: Secondary | ICD-10-CM | POA: Diagnosis not present

## 2024-10-10 ENCOUNTER — Ambulatory Visit: Admitting: Podiatry

## 2024-10-22 ENCOUNTER — Encounter: Payer: Self-pay | Admitting: Emergency Medicine

## 2024-10-23 DIAGNOSIS — N401 Enlarged prostate with lower urinary tract symptoms: Secondary | ICD-10-CM

## 2024-10-23 DIAGNOSIS — F0280 Dementia in other diseases classified elsewhere without behavioral disturbance: Secondary | ICD-10-CM

## 2024-10-23 NOTE — Telephone Encounter (Signed)
 Please advise

## 2024-10-23 NOTE — Telephone Encounter (Signed)
 Needs office visit.  Any provider can see him.  Thanks.

## 2024-10-25 ENCOUNTER — Ambulatory Visit: Admitting: Internal Medicine

## 2024-10-25 VITALS — BP 120/74 | HR 65 | Temp 98.9°F | Ht 65.75 in | Wt 155.0 lb

## 2024-10-25 DIAGNOSIS — E1159 Type 2 diabetes mellitus with other circulatory complications: Secondary | ICD-10-CM

## 2024-10-25 DIAGNOSIS — G309 Alzheimer's disease, unspecified: Secondary | ICD-10-CM | POA: Diagnosis not present

## 2024-10-25 DIAGNOSIS — H5713 Ocular pain, bilateral: Secondary | ICD-10-CM | POA: Diagnosis not present

## 2024-10-25 DIAGNOSIS — I152 Hypertension secondary to endocrine disorders: Secondary | ICD-10-CM | POA: Diagnosis not present

## 2024-10-25 DIAGNOSIS — H9121 Sudden idiopathic hearing loss, right ear: Secondary | ICD-10-CM | POA: Diagnosis not present

## 2024-10-25 DIAGNOSIS — F0280 Dementia in other diseases classified elsewhere without behavioral disturbance: Secondary | ICD-10-CM

## 2024-10-25 MED ORDER — CARBOXYMETHYLCELLULOSE SODIUM 0.5 % OP SOLN: 1.0000 [drp] | Freq: Every day | OPHTHALMIC | 5 refills | Status: AC

## 2024-10-25 NOTE — Assessment & Plan Note (Signed)
 Exam benign, ok for refill Refresh drops as this helped in the past

## 2024-10-25 NOTE — Assessment & Plan Note (Signed)
 Overall stable, cont aricept  and namenda 

## 2024-10-25 NOTE — Assessment & Plan Note (Signed)
 BP Readings from Last 3 Encounters:  10/25/24 120/74  10/09/24 137/83  08/16/24 124/80   Stable, pt to continue medical treatment doivan 160 mg per day

## 2024-10-25 NOTE — Progress Notes (Signed)
 Patient ID: Tony Price, male   DOB: Jun 19, 1941, 84 y.o.   MRN: [id number]        Chief Complaint: follow up eye discomfort, right hearing loss acute, and dementia, htn       HPI:  Tony Price is a 84 y.o. male here overall doing ok, but here with daughter who mentions more right side hearing loss recently without pain, fever or d/c.   Does have some mild eye discomfort and needs refill for Refresh eye drops. Also needs latonaprost but this comes from the eye doctor and may already be at the pharmacy    No vision change, pain or swelling.  Pt denies chest pain, increased sob or doe, wheezing, orthopnea, PND, increased LE swelling, palpitations, dizziness or syncope.   Pt denies polydipsia, polyuria, or new focal neuro s/s.   Dementia overall stable symptomatically, and not assoc with behavioral changes such as hallucinations, paranoia, or agitation.         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   Chest pain    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   Diabetic retinopathy 03/31/2016   Both eyes mild background diabetic retinopathy     Dyslipidemia associated with type 2 diabetes mellitus 11/17/2014   Erectile dysfunction due to arterial insufficiency 09/14/2021   History of colonic polyps 11/04/2020   Hypercholesterolemia    Hypertension associated with diabetes 11/17/2014   Hypotension due to hypovolemia 03/28/2024   Kidney stones    Post-inflammatory hyperpigmentation 03/30/2024   Postural dizziness with presyncope 03/28/2024   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 His smoking use included cigarettes. He has never used smokeless tobacco. He reports that he does not drink alcohol and does not use drugs. family history includes Dementia in his cousin. Allergies[1] Medications Ordered Prior to Encounter[2]      ROS:  All others reviewed and negative.  Objective        PE:  BP 120/74 (BP Location: Right Arm, Patient Position: Sitting, Cuff Size: Normal)   Pulse 65   Temp 98.9 F (37.2 C) (Oral)   Ht 5' 5.75 (1.67 m)   Wt 155 lb (70.3 kg)   SpO2 99%   BMI 25.21 kg/m                 Constitutional: Pt appears in NAD               HENT: Head: NCAT.                Right Ear: External ear normal.  Right wax impaction resolved               Left Ear: External ear normal.  Eyes: . Pupils are equal, round, and reactive to light. Conjunctivae and EOM are normal               Nose: without d/c or deformity               Neck: Neck supple. Gross normal ROM               Cardiovascular: Normal rate and regular rhythm.                 Pulmonary/Chest: Effort normal and breath sounds without rales or wheezing.                Abd:  Soft, NT, ND, + BS, no organomegaly               Neurological: Pt is alert. At baseline orientation, motor grossly intact               Skin: Skin is warm. No rashes, no other new lesions, LE edema - none               Psychiatric: Pt behavior is normal without agitation   Micro: none  Cardiac tracings I have personally interpreted today:  none  Pertinent Radiological findings (summarize): none   Lab Results  Component Value Date   WBC 6.8 08/16/2024   HGB 12.2 (L) 08/16/2024   HCT 38.6 (L) 08/16/2024   PLT 236.0 08/16/2024   GLUCOSE 78 08/16/2024   CHOL 197 08/16/2024   TRIG 87.0  08/16/2024   HDL 85.60 08/16/2024   LDLCALC 94 08/16/2024   ALT 14 08/16/2024   AST 23 08/16/2024   NA 141 08/16/2024   K 5.0 08/16/2024   CL 105 08/16/2024   CREATININE 1.50 08/16/2024   BUN 17 08/16/2024   CO2 30 08/16/2024   TSH 1.71 05/17/2022   PSA 19.19 (H) 04/28/2022   HGBA1C 6.5 08/16/2024   MICROALBUR <0.7 08/16/2024   Assessment/Plan:  Tony Price is a 84 y.o. Black or African American [2] male with  has a past medical history of Acute upper respiratory infection (10/21/2017), Aortic stenosis (03/28/2024), BPH with obstruction/lower urinary tract symptoms (07/27/2021), Chest pain, Dementia due to Alzheimer's disease (03/30/2024), Diabetic retinopathy (03/31/2016), Dyslipidemia associated with type 2 diabetes mellitus (11/17/2014), Erectile dysfunction due to arterial insufficiency (09/14/2021), History of colonic polyps (11/04/2020), Hypercholesterolemia, Hypertension associated with diabetes (11/17/2014), Hypotension due to hypovolemia (03/28/2024), Kidney stones, Post-inflammatory hyperpigmentation (03/30/2024), Postural dizziness with presyncope (03/28/2024), 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  Eye discomfort, bilateral Exam benign, ok for refill Refresh drops as this helped in the past  Dementia due to Alzheimer's disease Overall stable, cont aricept  and namenda   Hypertension associated with diabetes BP Readings from Last 3 Encounters:  10/25/24 120/74  10/09/24 137/83  08/16/24 124/80   Stable, pt to continue medical treatment doivan 160 mg per day   Sudden right hearing loss Exam c/w wax impaction - for irrigation today,  to f/u any worsening symptoms or concerns  Followup: Return if symptoms worsen or fail to improve.  Lynwood Rush, MD 10/25/2024 4:23 PM [facility]    [1] No Known Allergies [2]   Current Outpatient Medications on File Prior to Visit  Medication Sig Dispense Refill   Ascorbic Acid (VITAMIN C) 500 MG CAPS Take 1 tablet by mouth at bedtime.     aspirin  81 MG  EC tablet Take 81 mg by mouth at bedtime.     atorvastatin  (LIPITOR) 20 MG tablet TAKE 1 TABLET BY MOUTH EVERY DAY 90 tablet 0   cetirizine  (ZYRTEC ) 10 MG tablet Take 10 mg by mouth at bedtime.     donepezil  (ARICEPT ) 10 MG tablet TAKE 1 TABLET BY MOUTH EVERY DAY 30 tablet 1   escitalopram  (LEXAPRO ) 5 MG tablet TAKE 1 TABLET BY MOUTH EVERY DAY 90 tablet 0   glimepiride  (AMARYL ) 2 MG tablet TAKE 1 TABLET BY MOUTH 2 TIMES DAILY 180 tablet 1   latanoprost  (XALATAN ) 0.005 % ophthalmic solution Place 1 drop into both eyes at bedtime.     memantine  (NAMENDA ) 10 MG tablet TAKE 1 TABLET BY MOUTH 2 TIMES DAILY 180 tablet 3   QUEtiapine  (SEROQUEL ) 50 MG tablet TAKE 1 TABLET BY MOUTH AT BEDTIME 30 tablet 3   REXULTI  0.5 MG TABS Take 1 tablet by mouth daily.     tamsulosin  (FLOMAX ) 0.4 MG CAPS capsule TAKE 1 CAPSULE BY MOUTH EVERY DAY 90 capsule 2   valsartan  (DIOVAN ) 160 MG tablet TAKE 1 TABLET BY MOUTH EVERY DAY 90 tablet 3   No current facility-administered medications on file prior to visit.

## 2024-10-25 NOTE — Assessment & Plan Note (Signed)
 Exam c/w wax impaction - for irrigation today,  to f/u any worsening symptoms or concerns

## 2024-10-25 NOTE — Patient Instructions (Signed)
 Your right ear wax was resolved today  Please continue all other medications as before, and refills have been done for the carboxy medication, and the Latonaprost is likely at your pharmacy already  Please have the pharmacy call with any other refills you may need.  Please continue your efforts at being more active, low cholesterol diet, and weight control.  Please keep your appointments with your specialists as you may have planned

## 2024-10-31 ENCOUNTER — Other Ambulatory Visit: Payer: Self-pay | Admitting: Emergency Medicine

## 2024-10-31 ENCOUNTER — Other Ambulatory Visit: Payer: Self-pay | Admitting: Physician Assistant

## 2024-10-31 DIAGNOSIS — E785 Hyperlipidemia, unspecified: Secondary | ICD-10-CM

## 2024-11-14 ENCOUNTER — Encounter (INDEPENDENT_AMBULATORY_CARE_PROVIDER_SITE_OTHER): Admitting: Ophthalmology

## 2024-11-15 ENCOUNTER — Telehealth: Payer: Self-pay | Admitting: Physician Assistant

## 2024-11-15 NOTE — Telephone Encounter (Signed)
 Patient has been doing sundowning for a month or so. Please advise.

## 2024-11-15 NOTE — Telephone Encounter (Signed)
 Daughter also would like Rx's looked at not sleeping and anxious at night, if you can help

## 2024-11-15 NOTE — Telephone Encounter (Signed)
 POA ([place] ,Dianne ) needs letter of diagnosis and that she is his caregiver as Pt is no longer competent Would like it faxed to [phone]

## 2024-11-16 ENCOUNTER — Telehealth: Payer: Self-pay

## 2024-11-16 ENCOUNTER — Other Ambulatory Visit: Payer: Self-pay | Admitting: Physician Assistant

## 2024-11-16 NOTE — Telephone Encounter (Signed)
 Copied from CRM #[id number]. Topic: Clinical - Medication Refill >> Nov 16, 2024  8:02 AM Laymon HERO wrote: Medication: carboxymethylcellulose (REFRESH PLUS) 0.5 % SOLN  Has the patient contacted their pharmacy? Yes (Agent: If no, request that the patient contact the pharmacy for the refill. If patient does not wish to contact the pharmacy document the reason why and proceed with request.) (Agent: If yes, when and what did the pharmacy advise?)  This is the patient's preferred pharmacy:  [facility] - [place], [place] - [address] [place] [address] Phone: [phone] Fax: [phone]  Is this the correct pharmacy for this prescription? Yes If no, delete pharmacy and type the correct one.   Has the prescription been filled recently? Yes  Is the patient out of the medication? Yes  Has the patient been seen for an appointment in the last year OR does the patient have an upcoming appointment? Yes  Can we respond through MyChart? Yes  Agent: Please be advised that Rx refills may take up to 3 business days. We ask that you follow-up with your pharmacy.

## 2024-11-16 NOTE — Telephone Encounter (Signed)
 They are going to reach out the Pcp. Appreciated the call back from me.

## 2024-12-21 ENCOUNTER — Encounter (INDEPENDENT_AMBULATORY_CARE_PROVIDER_SITE_OTHER): Admitting: Ophthalmology

## 2025-01-02 ENCOUNTER — Ambulatory Visit: Admitting: Podiatry

## 2025-04-09 ENCOUNTER — Ambulatory Visit: Payer: Self-pay | Admitting: Physician Assistant
